# Patient Record
Sex: Male | Born: 1955
Health system: Southern US, Community
[De-identification: ages and names within clinical notes are randomized; demographics above are authoritative.]

## PROBLEM LIST (undated history)

## (undated) DIAGNOSIS — I428 Other cardiomyopathies: Secondary | ICD-10-CM

## (undated) DIAGNOSIS — Z860100 Personal history of colon polyps, unspecified: Secondary | ICD-10-CM

## (undated) DIAGNOSIS — I4719 Other supraventricular tachycardia: Secondary | ICD-10-CM

## (undated) DIAGNOSIS — F419 Anxiety disorder, unspecified: Secondary | ICD-10-CM

## (undated) DIAGNOSIS — R011 Cardiac murmur, unspecified: Secondary | ICD-10-CM

## (undated) DIAGNOSIS — I5022 Chronic systolic (congestive) heart failure: Secondary | ICD-10-CM

## (undated) DIAGNOSIS — K5790 Diverticulosis of intestine, part unspecified, without perforation or abscess without bleeding: Secondary | ICD-10-CM

## (undated) DIAGNOSIS — K219 Gastro-esophageal reflux disease without esophagitis: Secondary | ICD-10-CM

## (undated) DIAGNOSIS — E039 Hypothyroidism, unspecified: Secondary | ICD-10-CM

## (undated) DIAGNOSIS — E785 Hyperlipidemia, unspecified: Secondary | ICD-10-CM

## (undated) DIAGNOSIS — K760 Fatty (change of) liver, not elsewhere classified: Secondary | ICD-10-CM

## (undated) DIAGNOSIS — M545 Low back pain, unspecified: Secondary | ICD-10-CM

## (undated) DIAGNOSIS — M542 Cervicalgia: Secondary | ICD-10-CM

## (undated) DIAGNOSIS — E041 Nontoxic single thyroid nodule: Secondary | ICD-10-CM

## (undated) DIAGNOSIS — K279 Peptic ulcer, site unspecified, unspecified as acute or chronic, without hemorrhage or perforation: Secondary | ICD-10-CM

## (undated) DIAGNOSIS — I2510 Atherosclerotic heart disease of native coronary artery without angina pectoris: Secondary | ICD-10-CM

## (undated) DIAGNOSIS — I10 Essential (primary) hypertension: Secondary | ICD-10-CM

## (undated) DIAGNOSIS — M1990 Unspecified osteoarthritis, unspecified site: Secondary | ICD-10-CM

## (undated) DIAGNOSIS — I498 Other specified cardiac arrhythmias: Secondary | ICD-10-CM

## (undated) DIAGNOSIS — I471 Supraventricular tachycardia: Secondary | ICD-10-CM

## (undated) HISTORY — DX: Low back pain, unspecified: M54.50

## (undated) HISTORY — DX: Nontoxic single thyroid nodule: E04.1

## (undated) HISTORY — DX: Fatty (change of) liver, not elsewhere classified: K76.0

## (undated) HISTORY — DX: Cervicalgia: M54.2

## (undated) HISTORY — DX: Gastro-esophageal reflux disease without esophagitis: K21.9

## (undated) HISTORY — DX: Hereditary hemochromatosis: E83.110

## (undated) HISTORY — DX: Supraventricular tachycardia: I47.1

## (undated) HISTORY — DX: Personal history of colon polyps, unspecified: Z86.0100

## (undated) HISTORY — DX: Hyperlipidemia, unspecified: E78.5

## (undated) HISTORY — DX: Cardiac murmur, unspecified: R01.1

## (undated) HISTORY — DX: Diverticulosis of intestine, part unspecified, without perforation or abscess without bleeding: K57.90

## (undated) HISTORY — DX: Anxiety disorder, unspecified: F41.9

## (undated) HISTORY — PX: INGUINAL HERNIA REPAIR: SUR1180

## (undated) HISTORY — DX: Other supraventricular tachycardia: I47.19

## (undated) HISTORY — PX: TONSILLECTOMY: SUR1361

## (undated) HISTORY — DX: Personal history of colonic polyps: Z86.010

## (undated) HISTORY — DX: Unspecified osteoarthritis, unspecified site: M19.90

## (undated) HISTORY — DX: Low back pain: M54.5

## (undated) HISTORY — DX: Peptic ulcer, site unspecified, unspecified as acute or chronic, without hemorrhage or perforation: K27.9

## (undated) HISTORY — PX: SPINE SURGERY: SHX786

## (undated) HISTORY — DX: Essential (primary) hypertension: I10

---

## 1989-09-15 HISTORY — PX: BACK SURGERY: SHX140

## 1989-09-15 HISTORY — PX: LUMBAR DISC SURGERY: SHX700

## 1998-05-15 ENCOUNTER — Encounter: Admission: RE | Admit: 1998-05-15 | Discharge: 1998-08-13 | Payer: Self-pay | Admitting: Anesthesiology

## 1999-02-21 ENCOUNTER — Ambulatory Visit (HOSPITAL_COMMUNITY): Admission: RE | Admit: 1999-02-21 | Discharge: 1999-02-21 | Payer: Self-pay | Admitting: Internal Medicine

## 1999-02-21 ENCOUNTER — Encounter: Payer: Self-pay | Admitting: Internal Medicine

## 1999-03-25 ENCOUNTER — Ambulatory Visit (HOSPITAL_COMMUNITY): Admission: RE | Admit: 1999-03-25 | Discharge: 1999-03-25 | Payer: Self-pay | Admitting: Internal Medicine

## 1999-03-25 ENCOUNTER — Encounter: Payer: Self-pay | Admitting: Internal Medicine

## 1999-03-29 ENCOUNTER — Encounter: Payer: Self-pay | Admitting: Surgery

## 1999-03-29 ENCOUNTER — Inpatient Hospital Stay (HOSPITAL_COMMUNITY): Admission: EM | Admit: 1999-03-29 | Discharge: 1999-04-01 | Payer: Self-pay | Admitting: Emergency Medicine

## 1999-03-30 ENCOUNTER — Encounter: Payer: Self-pay | Admitting: Surgery

## 1999-04-01 ENCOUNTER — Encounter: Payer: Self-pay | Admitting: Surgery

## 1999-04-08 ENCOUNTER — Ambulatory Visit (HOSPITAL_COMMUNITY): Admission: RE | Admit: 1999-04-08 | Discharge: 1999-04-08 | Payer: Self-pay

## 2000-04-06 ENCOUNTER — Other Ambulatory Visit: Admission: RE | Admit: 2000-04-06 | Discharge: 2000-04-06 | Payer: Self-pay | Admitting: Gastroenterology

## 2001-02-16 ENCOUNTER — Encounter: Payer: Self-pay | Admitting: Orthopedic Surgery

## 2001-02-16 ENCOUNTER — Encounter: Admission: RE | Admit: 2001-02-16 | Discharge: 2001-02-16 | Payer: Self-pay | Admitting: Orthopedic Surgery

## 2001-02-23 ENCOUNTER — Encounter: Payer: Self-pay | Admitting: Orthopedic Surgery

## 2001-02-23 ENCOUNTER — Encounter: Admission: RE | Admit: 2001-02-23 | Discharge: 2001-02-23 | Payer: Self-pay | Admitting: Orthopedic Surgery

## 2001-03-10 ENCOUNTER — Encounter: Payer: Self-pay | Admitting: Orthopedic Surgery

## 2001-03-10 ENCOUNTER — Encounter: Admission: RE | Admit: 2001-03-10 | Discharge: 2001-03-10 | Payer: Self-pay | Admitting: Orthopedic Surgery

## 2001-03-25 ENCOUNTER — Encounter: Admission: RE | Admit: 2001-03-25 | Discharge: 2001-03-25 | Payer: Self-pay | Admitting: Orthopedic Surgery

## 2001-03-25 ENCOUNTER — Encounter: Payer: Self-pay | Admitting: Orthopedic Surgery

## 2001-04-22 ENCOUNTER — Ambulatory Visit (HOSPITAL_COMMUNITY): Admission: RE | Admit: 2001-04-22 | Discharge: 2001-04-22 | Payer: Self-pay | Admitting: Neurology

## 2001-05-24 ENCOUNTER — Encounter: Payer: Self-pay | Admitting: Endocrinology

## 2001-05-24 ENCOUNTER — Ambulatory Visit (HOSPITAL_COMMUNITY): Admission: RE | Admit: 2001-05-24 | Discharge: 2001-05-24 | Payer: Self-pay | Admitting: Endocrinology

## 2003-11-15 ENCOUNTER — Encounter: Payer: Self-pay | Admitting: Gastroenterology

## 2004-07-25 ENCOUNTER — Ambulatory Visit: Payer: Self-pay | Admitting: Internal Medicine

## 2004-07-25 ENCOUNTER — Ambulatory Visit: Payer: Self-pay | Admitting: Cardiology

## 2004-07-29 ENCOUNTER — Ambulatory Visit (HOSPITAL_COMMUNITY): Admission: RE | Admit: 2004-07-29 | Discharge: 2004-07-29 | Payer: Self-pay | Admitting: Internal Medicine

## 2004-10-30 ENCOUNTER — Ambulatory Visit: Payer: Self-pay | Admitting: Internal Medicine

## 2005-02-12 ENCOUNTER — Ambulatory Visit: Payer: Self-pay | Admitting: Internal Medicine

## 2005-08-21 ENCOUNTER — Encounter: Admission: RE | Admit: 2005-08-21 | Discharge: 2005-08-21 | Payer: Self-pay | Admitting: Orthopedic Surgery

## 2005-11-05 ENCOUNTER — Ambulatory Visit: Payer: Self-pay | Admitting: Internal Medicine

## 2005-11-12 ENCOUNTER — Ambulatory Visit: Payer: Self-pay | Admitting: Internal Medicine

## 2006-04-21 ENCOUNTER — Ambulatory Visit: Payer: Self-pay | Admitting: Internal Medicine

## 2006-10-22 ENCOUNTER — Ambulatory Visit: Payer: Self-pay | Admitting: Internal Medicine

## 2006-10-22 LAB — CONVERTED CEMR LAB
Alkaline Phosphatase: 75 units/L (ref 39–117)
BUN: 18 mg/dL (ref 6–23)
Basophils Absolute: 0 10*3/uL (ref 0.0–0.1)
Bilirubin, Direct: 0.1 mg/dL (ref 0.0–0.3)
CO2: 28 meq/L (ref 19–32)
Calcium: 9.1 mg/dL (ref 8.4–10.5)
Creatinine,U: 175.4 mg/dL
Eosinophils Relative: 2.4 % (ref 0.0–5.0)
GFR calc Af Amer: 92 mL/min
GFR calc non Af Amer: 76 mL/min
Glucose, Bld: 134 mg/dL — ABNORMAL HIGH (ref 70–99)
HCT: 44.2 % (ref 39.0–52.0)
Hemoglobin: 15.7 g/dL (ref 13.0–17.0)
Hgb A1c MFr Bld: 6.5 % — ABNORMAL HIGH (ref 4.6–6.0)
Leukocytes, UA: NEGATIVE
MCV: 90.7 fL (ref 78.0–100.0)
Microalb, Ur: 0.5 mg/dL (ref 0.0–1.9)
Monocytes Absolute: 0.9 10*3/uL — ABNORMAL HIGH (ref 0.2–0.7)
Monocytes Relative: 12.2 % — ABNORMAL HIGH (ref 3.0–11.0)
Neutrophils Relative %: 56.6 % (ref 43.0–77.0)
PSA: 0.38 ng/mL (ref 0.10–4.00)
RBC: 4.87 M/uL (ref 4.22–5.81)
RDW: 12.2 % (ref 11.5–14.6)
Specific Gravity, Urine: >=1.03 (ref 1.000–1.03)
Total Bilirubin: 0.5 mg/dL (ref 0.3–1.2)
Total CHOL/HDL Ratio: 5.8
Total Protein, Urine: NEGATIVE mg/dL
Total Protein: 7 g/dL (ref 6.0–8.3)
Triglycerides: 359 mg/dL (ref 0–149)
Urine Glucose: 250 mg/dL — AB

## 2006-10-23 ENCOUNTER — Encounter: Payer: Self-pay | Admitting: Internal Medicine

## 2007-06-07 ENCOUNTER — Encounter: Payer: Self-pay | Admitting: Internal Medicine

## 2007-06-07 DIAGNOSIS — I10 Essential (primary) hypertension: Secondary | ICD-10-CM | POA: Insufficient documentation

## 2007-06-07 DIAGNOSIS — Z8719 Personal history of other diseases of the digestive system: Secondary | ICD-10-CM | POA: Insufficient documentation

## 2007-06-07 DIAGNOSIS — Z860100 Personal history of colon polyps, unspecified: Secondary | ICD-10-CM | POA: Insufficient documentation

## 2007-06-07 DIAGNOSIS — E785 Hyperlipidemia, unspecified: Secondary | ICD-10-CM

## 2007-06-07 DIAGNOSIS — K828 Other specified diseases of gallbladder: Secondary | ICD-10-CM

## 2007-06-07 DIAGNOSIS — K219 Gastro-esophageal reflux disease without esophagitis: Secondary | ICD-10-CM

## 2007-11-02 ENCOUNTER — Ambulatory Visit: Payer: Self-pay | Admitting: Internal Medicine

## 2007-11-02 DIAGNOSIS — M545 Low back pain, unspecified: Secondary | ICD-10-CM | POA: Insufficient documentation

## 2007-11-02 DIAGNOSIS — K279 Peptic ulcer, site unspecified, unspecified as acute or chronic, without hemorrhage or perforation: Secondary | ICD-10-CM | POA: Insufficient documentation

## 2007-11-02 DIAGNOSIS — F411 Generalized anxiety disorder: Secondary | ICD-10-CM | POA: Insufficient documentation

## 2007-11-02 DIAGNOSIS — E119 Type 2 diabetes mellitus without complications: Secondary | ICD-10-CM | POA: Insufficient documentation

## 2007-11-03 LAB — CONVERTED CEMR LAB
AST: 52 units/L — ABNORMAL HIGH (ref 0–37)
BUN: 16 mg/dL (ref 6–23)
Basophils Absolute: 0 10*3/uL (ref 0.0–0.1)
Basophils Relative: 0.1 % (ref 0.0–1.0)
Calcium: 9.8 mg/dL (ref 8.4–10.5)
Chloride: 104 meq/L (ref 96–112)
Eosinophils Absolute: 0.3 10*3/uL (ref 0.0–0.6)
HCT: 45 % (ref 39.0–52.0)
Hemoglobin: 15.6 g/dL (ref 13.0–17.0)
Hgb A1c MFr Bld: 7 % — ABNORMAL HIGH (ref 4.6–6.0)
Ketones, ur: NEGATIVE mg/dL
Leukocytes, UA: NEGATIVE
MCHC: 34.6 g/dL (ref 30.0–36.0)
MCV: 90.5 fL (ref 78.0–100.0)
Microalb Creat Ratio: 12.8 mg/g (ref 0.0–30.0)
Monocytes Relative: 7.6 % (ref 3.0–11.0)
Nitrite: NEGATIVE
PSA: 0.39 ng/mL (ref 0.10–4.00)
Platelets: 192 10*3/uL (ref 150–400)
Potassium: 4.9 meq/L (ref 3.5–5.1)
RDW: 12.4 % (ref 11.5–14.6)
Sodium: 139 meq/L (ref 135–145)
Specific Gravity, Urine: >=1.03 (ref 1.000–1.03)
TSH: 2.43 microintl units/mL (ref 0.35–5.50)
Total Bilirubin: 0.9 mg/dL (ref 0.3–1.2)
Total CHOL/HDL Ratio: 7.5
Total Protein, Urine: NEGATIVE mg/dL
Total Protein: 7.4 g/dL (ref 6.0–8.3)
Triglycerides: 324 mg/dL (ref 0–149)
Urine Glucose: 100 mg/dL — AB
pH: 5.5 (ref 5.0–8.0)

## 2008-01-12 ENCOUNTER — Ambulatory Visit: Payer: Self-pay | Admitting: Internal Medicine

## 2008-01-12 DIAGNOSIS — H6590 Unspecified nonsuppurative otitis media, unspecified ear: Secondary | ICD-10-CM | POA: Insufficient documentation

## 2008-05-01 ENCOUNTER — Ambulatory Visit: Payer: Self-pay | Admitting: Internal Medicine

## 2008-05-01 LAB — CONVERTED CEMR LAB
BUN: 10 mg/dL (ref 6–23)
Creatinine, Ser: 1.2 mg/dL (ref 0.4–1.5)
Direct LDL: 74.4 mg/dL
GFR calc Af Amer: 82 mL/min
HDL: 20.7 mg/dL — ABNORMAL LOW (ref >39.0)
Hgb A1c MFr Bld: 7 % — ABNORMAL HIGH (ref 4.6–6.0)
Potassium: 3.7 meq/L (ref 3.5–5.1)

## 2008-05-02 ENCOUNTER — Ambulatory Visit: Payer: Self-pay | Admitting: Internal Medicine

## 2008-05-02 DIAGNOSIS — H6980 Other specified disorders of Eustachian tube, unspecified ear: Secondary | ICD-10-CM

## 2008-10-27 ENCOUNTER — Ambulatory Visit: Payer: Self-pay | Admitting: Internal Medicine

## 2008-10-27 LAB — CONVERTED CEMR LAB
ALT: 129 units/L — ABNORMAL HIGH (ref 0–53)
AST: 91 units/L — ABNORMAL HIGH (ref 0–37)
Albumin: 4.1 g/dL (ref 3.5–5.2)
Basophils Absolute: 0.1 10*3/uL (ref 0.0–0.1)
Basophils Relative: 0.7 % (ref 0.0–3.0)
CO2: 30 meq/L (ref 19–32)
Calcium: 9.4 mg/dL (ref 8.4–10.5)
Cholesterol: 164 mg/dL (ref 0–200)
Creatinine, Ser: 1 mg/dL (ref 0.4–1.5)
Creatinine,U: 167.3 mg/dL
Direct LDL: 90.4 mg/dL
Eosinophils Relative: 2 % (ref 0.0–5.0)
Hgb A1c MFr Bld: 7.1 % — ABNORMAL HIGH (ref 4.6–6.0)
Lymphocytes Relative: 29.1 % (ref 12.0–46.0)
MCHC: 35.5 g/dL (ref 30.0–36.0)
Microalb, Ur: 0.6 mg/dL (ref 0.0–1.9)
Monocytes Absolute: 0.9 10*3/uL (ref 0.1–1.0)
Monocytes Relative: 11 % (ref 3.0–12.0)
Neutro Abs: 4.4 10*3/uL (ref 1.4–7.7)
Neutrophils Relative %: 57.2 % (ref 43.0–77.0)
Platelets: 176 10*3/uL (ref 150–400)
RDW: 12.5 % (ref 11.5–14.6)
TSH: 2.88 microintl units/mL (ref 0.35–5.50)
Total Protein, Urine: NEGATIVE mg/dL
Total Protein: 7.4 g/dL (ref 6.0–8.3)
Triglycerides: 241 mg/dL (ref 0–149)
Urine Glucose: 100 mg/dL — AB

## 2008-11-02 ENCOUNTER — Ambulatory Visit: Payer: Self-pay | Admitting: Internal Medicine

## 2008-11-02 DIAGNOSIS — R740 Nonspecific elevation of levels of transaminase and lactic acid dehydrogenase [LDH]: Secondary | ICD-10-CM

## 2008-11-02 DIAGNOSIS — R079 Chest pain, unspecified: Secondary | ICD-10-CM

## 2008-11-10 ENCOUNTER — Ambulatory Visit: Payer: Self-pay

## 2008-11-10 ENCOUNTER — Encounter: Payer: Self-pay | Admitting: Internal Medicine

## 2008-11-13 ENCOUNTER — Telehealth: Payer: Self-pay | Admitting: Internal Medicine

## 2008-11-13 DIAGNOSIS — R9431 Abnormal electrocardiogram [ECG] [EKG]: Secondary | ICD-10-CM

## 2008-11-17 ENCOUNTER — Ambulatory Visit: Payer: Self-pay | Admitting: Cardiology

## 2008-11-17 LAB — CONVERTED CEMR LAB
Basophils Absolute: 0 10*3/uL (ref 0.0–0.1)
Basophils Relative: 0 % (ref 0.0–3.0)
CO2: 29 meq/L (ref 19–32)
Eosinophils Relative: 1.9 % (ref 0.0–5.0)
GFR calc non Af Amer: 75 mL/min
Glucose, Bld: 132 mg/dL — ABNORMAL HIGH (ref 70–99)
Hemoglobin: 16.5 g/dL (ref 13.0–17.0)
Lymphocytes Relative: 28.9 % (ref 12.0–46.0)
MCHC: 34.7 g/dL (ref 30.0–36.0)
Neutro Abs: 5.4 10*3/uL (ref 1.4–7.7)
RBC: 5.2 M/uL (ref 4.22–5.81)
RDW: 12.6 % (ref 11.5–14.6)
Sodium: 140 meq/L (ref 135–145)

## 2008-11-22 ENCOUNTER — Inpatient Hospital Stay (HOSPITAL_BASED_OUTPATIENT_CLINIC_OR_DEPARTMENT_OTHER): Admission: RE | Admit: 2008-11-22 | Discharge: 2008-11-22 | Payer: Self-pay | Admitting: Cardiology

## 2008-11-22 ENCOUNTER — Ambulatory Visit: Payer: Self-pay | Admitting: Cardiology

## 2008-11-30 ENCOUNTER — Ambulatory Visit: Payer: Self-pay | Admitting: Internal Medicine

## 2008-11-30 LAB — CONVERTED CEMR LAB
ALT: 115 units/L — ABNORMAL HIGH (ref 0–53)
Albumin: 4.2 g/dL (ref 3.5–5.2)
Alkaline Phosphatase: 75 units/L (ref 39–117)
Bilirubin, Direct: 0.2 mg/dL (ref 0.0–0.3)
Total Bilirubin: 1.3 mg/dL — ABNORMAL HIGH (ref 0.3–1.2)
Total CHOL/HDL Ratio: 6
Total Protein: 7.7 g/dL (ref 6.0–8.3)
Triglycerides: 192 mg/dL — ABNORMAL HIGH (ref 0.0–149.0)

## 2008-12-01 ENCOUNTER — Encounter: Payer: Self-pay | Admitting: Internal Medicine

## 2008-12-01 ENCOUNTER — Encounter (INDEPENDENT_AMBULATORY_CARE_PROVIDER_SITE_OTHER): Payer: Self-pay | Admitting: *Deleted

## 2008-12-04 LAB — CONVERTED CEMR LAB
HCV Ab: NEGATIVE
Hep B C IgM: NEGATIVE

## 2008-12-07 ENCOUNTER — Ambulatory Visit: Payer: Self-pay | Admitting: Cardiology

## 2008-12-07 ENCOUNTER — Encounter: Payer: Self-pay | Admitting: Cardiology

## 2008-12-29 ENCOUNTER — Ambulatory Visit: Payer: Self-pay | Admitting: Gastroenterology

## 2008-12-29 LAB — CONVERTED CEMR LAB
ANA Titer 1: 1:40 {titer} — ABNORMAL HIGH
Ceruloplasmin: 35 mg/dL (ref 21–63)
Ferritin: 513.3 ng/mL — ABNORMAL HIGH (ref 22.0–322.0)
Iron: 127 ug/dL (ref 42–165)
Saturation Ratios: 39.8 % (ref 20.0–50.0)
Sed Rate: 9 mm/hr (ref 0–22)
Transferrin: 228.2 mg/dL (ref 212.0–360.0)
Vitamin B-12: 610 pg/mL (ref 211–911)

## 2009-01-02 ENCOUNTER — Ambulatory Visit (HOSPITAL_COMMUNITY): Admission: RE | Admit: 2009-01-02 | Discharge: 2009-01-02 | Payer: Self-pay | Admitting: Gastroenterology

## 2009-01-09 ENCOUNTER — Telehealth: Payer: Self-pay | Admitting: Gastroenterology

## 2009-01-16 ENCOUNTER — Ambulatory Visit: Payer: Self-pay | Admitting: Gastroenterology

## 2009-01-17 LAB — CONVERTED CEMR LAB
Albumin: 4 g/dL (ref 3.5–5.2)
Alkaline Phosphatase: 72 units/L (ref 39–117)
Bilirubin, Direct: 0.1 mg/dL (ref 0.0–0.3)
Total Bilirubin: 1.1 mg/dL (ref 0.3–1.2)

## 2009-01-23 ENCOUNTER — Ambulatory Visit: Payer: Self-pay | Admitting: Gastroenterology

## 2009-01-23 DIAGNOSIS — M1289 Other specific arthropathies, not elsewhere classified, multiple sites: Secondary | ICD-10-CM | POA: Insufficient documentation

## 2009-01-29 ENCOUNTER — Ambulatory Visit: Payer: Self-pay | Admitting: Gastroenterology

## 2009-03-01 ENCOUNTER — Ambulatory Visit: Payer: Self-pay | Admitting: Gastroenterology

## 2009-03-02 LAB — CONVERTED CEMR LAB
HCT: 44.5 % (ref 39.0–52.0)
Hemoglobin: 15.5 g/dL (ref 13.0–17.0)

## 2009-03-30 ENCOUNTER — Ambulatory Visit: Payer: Self-pay | Admitting: Gastroenterology

## 2009-03-30 LAB — CONVERTED CEMR LAB
Ferritin: 306.8 ng/mL (ref 22.0–322.0)
MCV: 91.2 fL (ref 78.0–100.0)
Monocytes Relative: 10.2 % (ref 3.0–12.0)
Platelets: 176 10*3/uL (ref 150.0–400.0)
RBC: 4.96 M/uL (ref 4.22–5.81)
RDW: 12.8 % (ref 11.5–14.6)

## 2009-04-23 ENCOUNTER — Ambulatory Visit: Payer: Self-pay | Admitting: Internal Medicine

## 2009-04-23 LAB — CONVERTED CEMR LAB
BUN: 16 mg/dL (ref 6–23)
CO2: 29 meq/L (ref 19–32)
Calcium: 9.4 mg/dL (ref 8.4–10.5)
Chloride: 106 meq/L (ref 96–112)
Cholesterol: 193 mg/dL (ref 0–200)
Creatinine, Ser: 1.1 mg/dL (ref 0.4–1.5)
Direct LDL: 119.4 mg/dL
GFR calc non Af Amer: 74.69 mL/min (ref >60)
Glucose, Bld: 124 mg/dL — ABNORMAL HIGH (ref 70–99)
HDL: 24 mg/dL — ABNORMAL LOW (ref >39.00)
Hgb A1c MFr Bld: 6.3 % (ref 4.6–6.5)
Potassium: 4.5 meq/L (ref 3.5–5.1)
Sodium: 143 meq/L (ref 135–145)
Total CHOL/HDL Ratio: 8
Triglycerides: 246 mg/dL — ABNORMAL HIGH (ref 0.0–149.0)
VLDL: 49.2 mg/dL — ABNORMAL HIGH (ref 0.0–40.0)

## 2009-05-01 ENCOUNTER — Ambulatory Visit: Payer: Self-pay | Admitting: Gastroenterology

## 2009-05-02 LAB — CONVERTED CEMR LAB: HCT: 45.9 % (ref 39.0–52.0)

## 2009-05-03 ENCOUNTER — Ambulatory Visit: Payer: Self-pay | Admitting: Internal Medicine

## 2009-05-03 DIAGNOSIS — M542 Cervicalgia: Secondary | ICD-10-CM | POA: Insufficient documentation

## 2009-05-31 ENCOUNTER — Ambulatory Visit: Payer: Self-pay | Admitting: Gastroenterology

## 2009-06-04 LAB — CONVERTED CEMR LAB
Basophils Absolute: 0 10*3/uL (ref 0.0–0.1)
Ferritin: 193.4 ng/mL (ref 22.0–322.0)
HCT: 45.9 % (ref 39.0–52.0)
Hemoglobin: 15.5 g/dL (ref 13.0–17.0)
Lymphs Abs: 2.6 10*3/uL (ref 0.7–4.0)
MCHC: 33.8 g/dL (ref 30.0–36.0)
MCV: 93 fL (ref 78.0–100.0)
Monocytes Absolute: 0.6 10*3/uL (ref 0.1–1.0)
Neutrophils Relative %: 60.6 % (ref 43.0–77.0)
Platelets: 193 10*3/uL (ref 150.0–400.0)
WBC: 8.9 10*3/uL (ref 4.5–10.5)

## 2009-06-12 ENCOUNTER — Telehealth: Payer: Self-pay | Admitting: Internal Medicine

## 2009-06-29 ENCOUNTER — Encounter: Payer: Self-pay | Admitting: Internal Medicine

## 2009-07-02 ENCOUNTER — Ambulatory Visit: Payer: Self-pay | Admitting: Gastroenterology

## 2009-07-04 LAB — CONVERTED CEMR LAB: HCT: 45.2 % (ref 39.0–52.0)

## 2009-07-31 ENCOUNTER — Ambulatory Visit: Payer: Self-pay | Admitting: Gastroenterology

## 2009-07-31 LAB — CONVERTED CEMR LAB: HCT: 46.6 % (ref 39.0–52.0)

## 2009-08-31 ENCOUNTER — Ambulatory Visit: Payer: Self-pay | Admitting: Gastroenterology

## 2009-10-02 ENCOUNTER — Ambulatory Visit: Payer: Self-pay | Admitting: Gastroenterology

## 2009-10-02 LAB — CONVERTED CEMR LAB: Hemoglobin: 14.4 g/dL (ref 13.0–17.0)

## 2009-11-01 ENCOUNTER — Ambulatory Visit: Payer: Self-pay | Admitting: Internal Medicine

## 2009-11-01 LAB — CONVERTED CEMR LAB
ALT: 114 units/L — ABNORMAL HIGH (ref 0–53)
Albumin: 3.8 g/dL (ref 3.5–5.2)
BUN: 10 mg/dL (ref 6–23)
Basophils Absolute: 0.1 10*3/uL (ref 0.0–0.1)
Bilirubin, Direct: 0.1 mg/dL (ref 0.0–0.3)
Creatinine, Ser: 1 mg/dL (ref 0.4–1.5)
Creatinine,U: 146.1 mg/dL
Direct LDL: 141 mg/dL
Eosinophils Relative: 2.6 % (ref 0.0–5.0)
HCT: 42 % (ref 39.0–52.0)
Hemoglobin: 13.6 g/dL (ref 13.0–17.0)
Lymphocytes Relative: 29.6 % (ref 12.0–46.0)
Microalb Creat Ratio: 4.1 mg/g (ref 0.0–30.0)
Monocytes Absolute: 0.6 10*3/uL (ref 0.1–1.0)
Monocytes Relative: 8.2 % (ref 3.0–12.0)
PSA: 0.41 ng/mL (ref 0.10–4.00)
Potassium: 4.4 meq/L (ref 3.5–5.1)
RBC: 4.89 M/uL (ref 4.22–5.81)
RDW: 12.6 % (ref 11.5–14.6)
TSH: 2.39 microintl units/mL (ref 0.35–5.50)
Total Bilirubin: 0.5 mg/dL (ref 0.3–1.2)
Total CHOL/HDL Ratio: 7
Total Protein, Urine: NEGATIVE mg/dL
Total Protein: 7.9 g/dL (ref 6.0–8.3)
Urine Glucose: 100 mg/dL
VLDL: 51.8 mg/dL — ABNORMAL HIGH (ref 0.0–40.0)
WBC: 7.5 10*3/uL (ref 4.5–10.5)

## 2009-11-08 ENCOUNTER — Ambulatory Visit: Payer: Self-pay | Admitting: Internal Medicine

## 2009-11-08 ENCOUNTER — Telehealth: Payer: Self-pay | Admitting: Internal Medicine

## 2009-12-31 ENCOUNTER — Ambulatory Visit: Payer: Self-pay | Admitting: Gastroenterology

## 2009-12-31 ENCOUNTER — Telehealth: Payer: Self-pay | Admitting: Gastroenterology

## 2009-12-31 LAB — CONVERTED CEMR LAB: Ferritin: 23.4 ng/mL (ref 22.0–322.0)

## 2010-01-02 ENCOUNTER — Ambulatory Visit: Payer: Self-pay | Admitting: Cardiology

## 2010-01-21 ENCOUNTER — Ambulatory Visit (HOSPITAL_COMMUNITY): Admission: RE | Admit: 2010-01-21 | Discharge: 2010-01-21 | Payer: Self-pay | Admitting: Cardiology

## 2010-01-21 ENCOUNTER — Encounter: Payer: Self-pay | Admitting: Cardiology

## 2010-01-21 ENCOUNTER — Ambulatory Visit: Payer: Self-pay

## 2010-01-21 ENCOUNTER — Ambulatory Visit: Payer: Self-pay | Admitting: Internal Medicine

## 2010-01-22 ENCOUNTER — Ambulatory Visit: Payer: Self-pay | Admitting: Cardiology

## 2010-01-22 ENCOUNTER — Encounter (INDEPENDENT_AMBULATORY_CARE_PROVIDER_SITE_OTHER): Payer: Self-pay | Admitting: *Deleted

## 2010-01-22 DIAGNOSIS — R000 Tachycardia, unspecified: Secondary | ICD-10-CM

## 2010-01-22 DIAGNOSIS — I5022 Chronic systolic (congestive) heart failure: Secondary | ICD-10-CM

## 2010-01-25 ENCOUNTER — Ambulatory Visit: Payer: Self-pay | Admitting: Cardiology

## 2010-01-29 ENCOUNTER — Telehealth: Payer: Self-pay | Admitting: Cardiology

## 2010-02-10 IMAGING — US US ABDOMEN COMPLETE
1 series · 14 of 25 positions shown · non-contrast
Comparison: 07/29/2004.

CLINICAL DATA: Abdominal pain.

COMPLETE ABDOMINAL ULTRASOUND

[Series 1: us abdomen complete · 0.28mm/px · 14 of 103 slices shown]
[im 1/103]
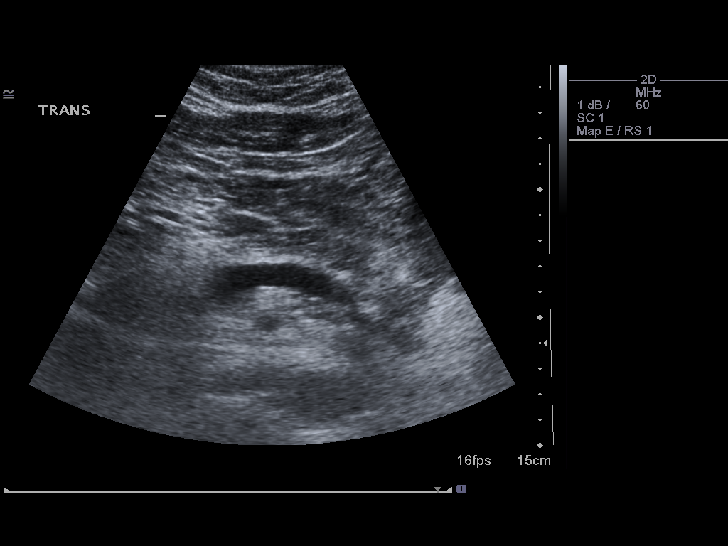
[im 9/103]
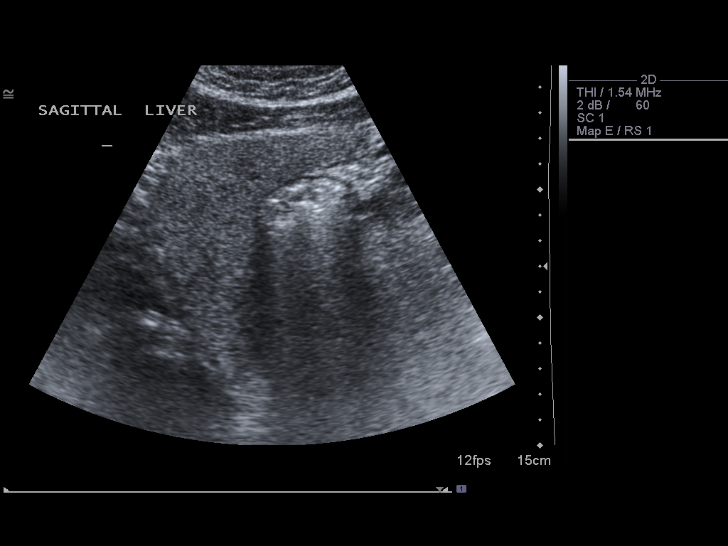
[im 18/103]
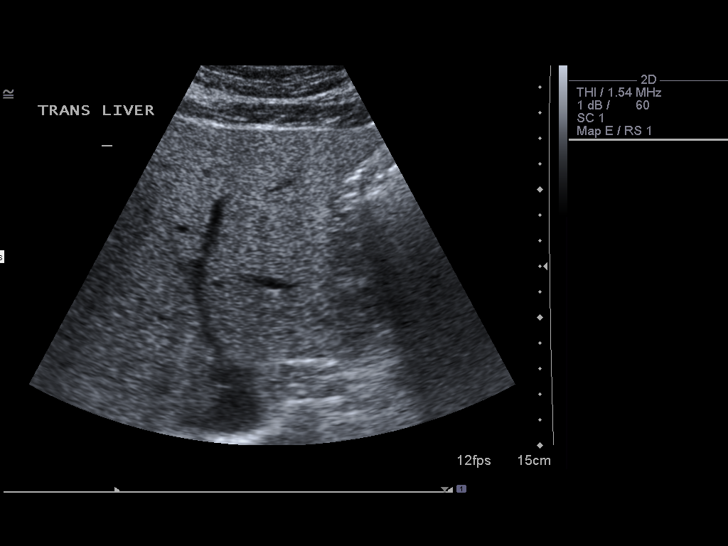
[im 26/103]
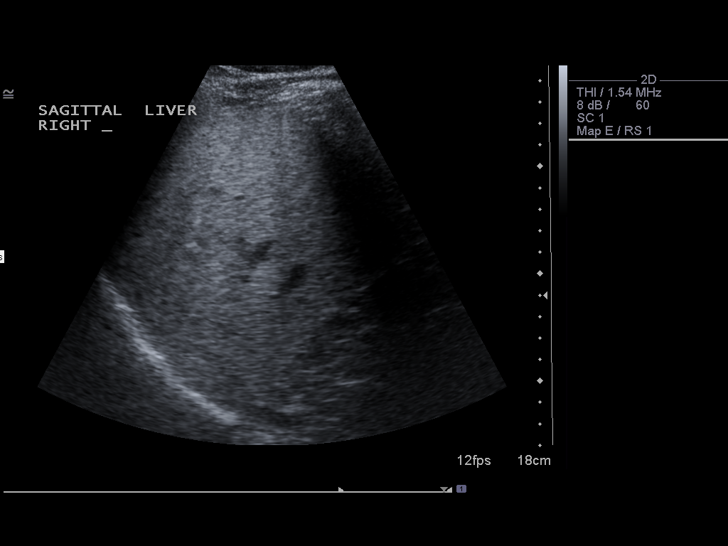
[im 35/103]
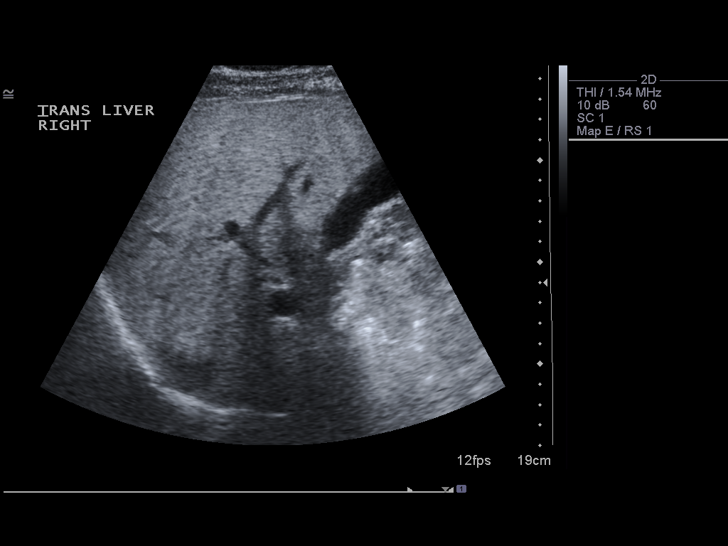
[im 39/103]
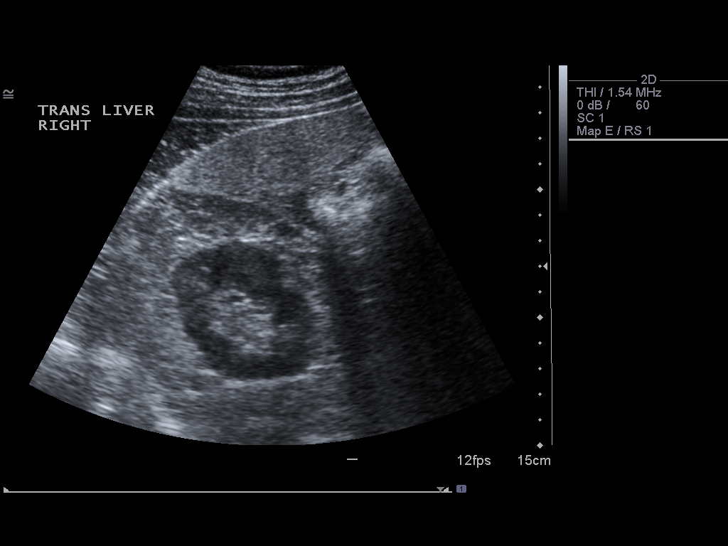
[im 47/103]
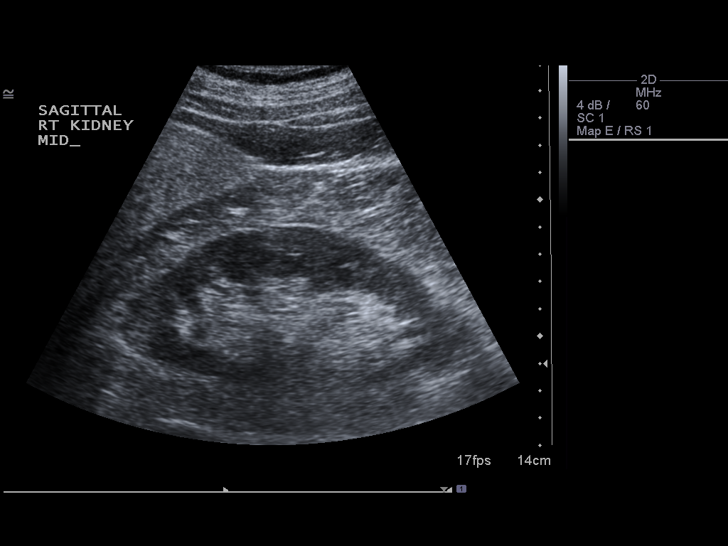
[im 56/103]
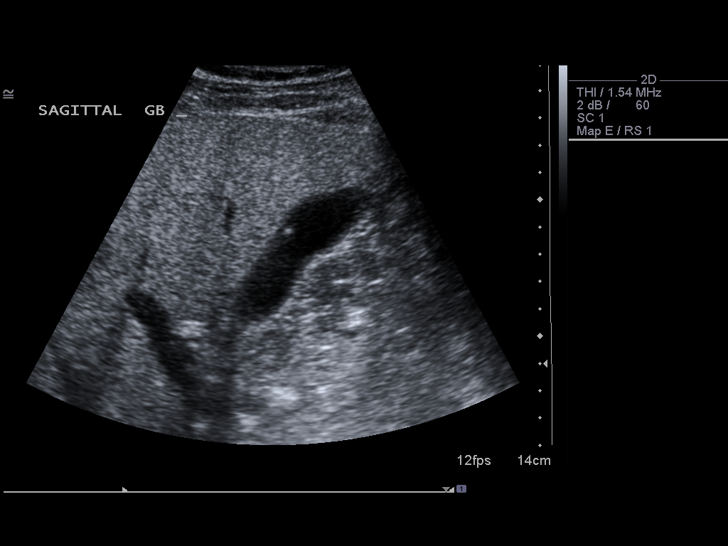
[im 64/103]
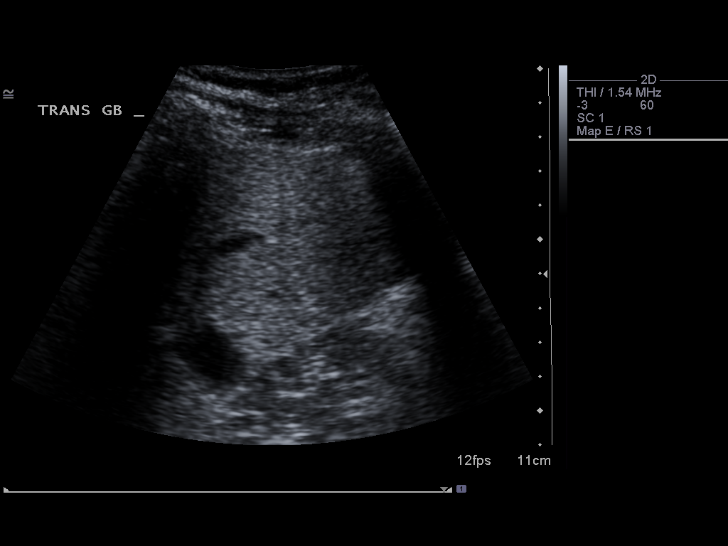
[im 69/103]
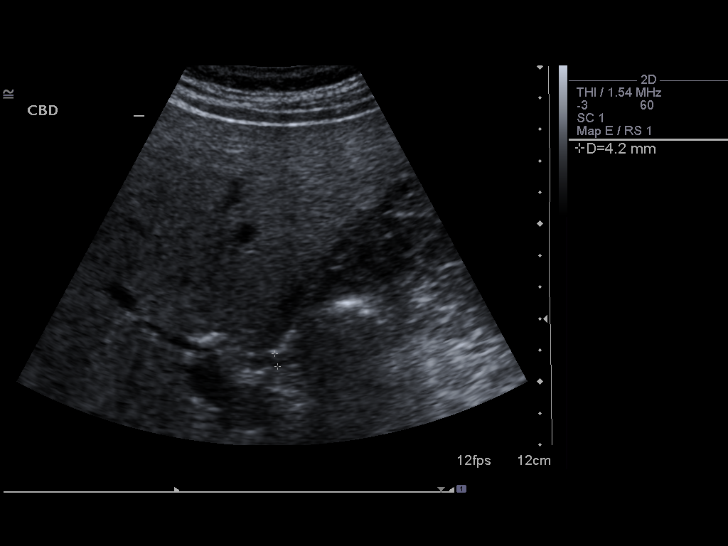
[im 77/103]
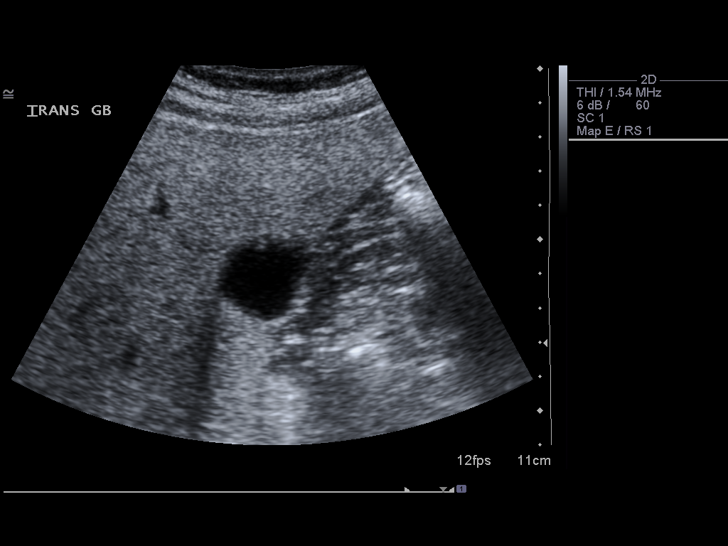
[im 86/103]
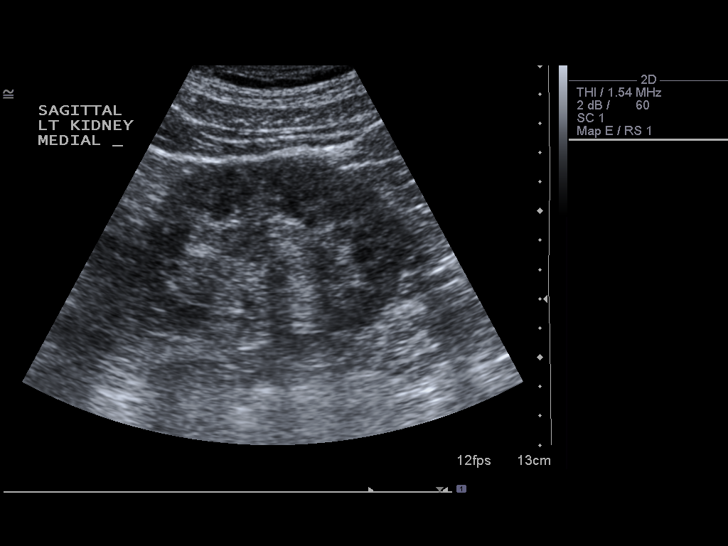
[im 94/103]
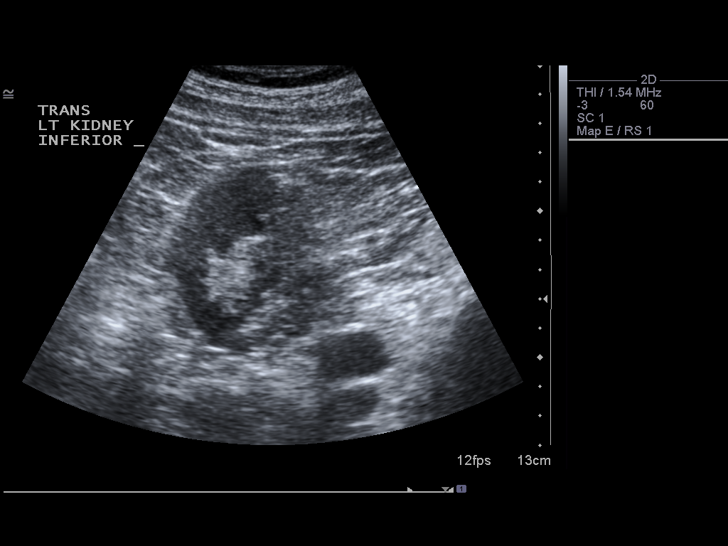
[im 103/103]
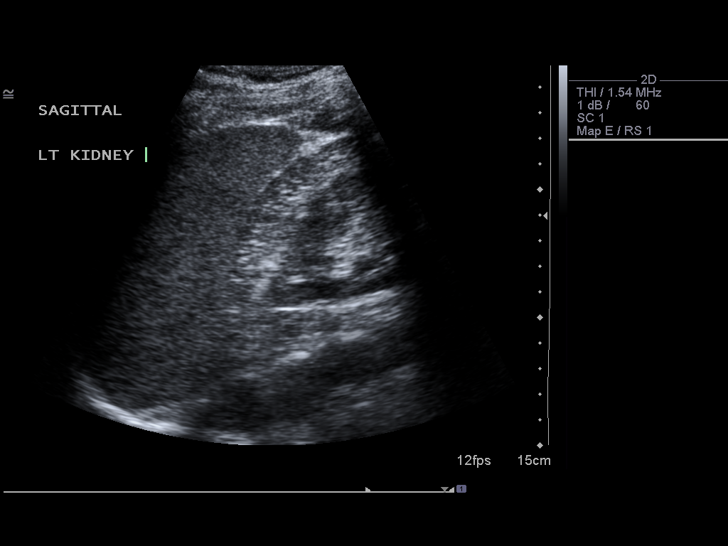

[14 of 25 positions shown; findings below may reference images not displayed]

FINDINGS: Gallbladder:  There is a non mobile nonshadowing echogenic focus
adjacent to the   gallbladder wall compatible with a polyp
measuring 5 mm.  This is similar to the prior study.  No gallstones
are present and there is no gallbladder wall thickening.

Common bile duct:  4.2 mm.

Liver:  Echogenic liver compatible with fatty infiltration.

IVC:  Negative.

Pancreas:  Limited evaluation of  the pancreas due to bowel gas.
No focal mass or fluid is identified.

Spleen:  Upper normal sized spleen measuring 12 cm.  This is
similar to the prior study.

Right Kidney:  13.2 cm.  No focal abnormality.

Left Kidney:  12.5 cm.  No focal abnormality.

Abdominal aorta:  2.2 cm in diameter without aneurysm.
IMPRESSION: Gallbladder polyp without gallstones

Fatty infiltration of the liver.

Spleen is upper normal in size.

## 2010-02-13 ENCOUNTER — Ambulatory Visit (HOSPITAL_COMMUNITY): Admission: RE | Admit: 2010-02-13 | Discharge: 2010-02-13 | Payer: Self-pay | Admitting: Cardiology

## 2010-02-14 ENCOUNTER — Ambulatory Visit: Payer: Self-pay | Admitting: Cardiology

## 2010-02-15 LAB — CONVERTED CEMR LAB
Bilirubin, Direct: 0.2 mg/dL (ref 0.0–0.3)
TSH: 3.61 microintl units/mL (ref 0.35–5.50)
Total Bilirubin: 0.6 mg/dL (ref 0.3–1.2)

## 2010-02-21 ENCOUNTER — Ambulatory Visit: Payer: Self-pay | Admitting: Internal Medicine

## 2010-02-22 ENCOUNTER — Encounter: Payer: Self-pay | Admitting: Internal Medicine

## 2010-02-26 ENCOUNTER — Encounter: Payer: Self-pay | Admitting: Cardiology

## 2010-02-27 ENCOUNTER — Telehealth: Payer: Self-pay | Admitting: Cardiology

## 2010-03-01 ENCOUNTER — Ambulatory Visit: Payer: Self-pay | Admitting: Cardiology

## 2010-04-03 ENCOUNTER — Ambulatory Visit: Payer: Self-pay | Admitting: Cardiology

## 2010-04-16 ENCOUNTER — Telehealth: Payer: Self-pay | Admitting: Cardiology

## 2010-04-17 ENCOUNTER — Telehealth: Payer: Self-pay | Admitting: Cardiology

## 2010-04-17 ENCOUNTER — Ambulatory Visit: Payer: Self-pay | Admitting: Internal Medicine

## 2010-04-17 ENCOUNTER — Encounter: Payer: Self-pay | Admitting: Cardiology

## 2010-04-18 ENCOUNTER — Telehealth: Payer: Self-pay | Admitting: Cardiology

## 2010-04-18 LAB — CONVERTED CEMR LAB
Direct LDL: 134 mg/dL
GFR calc non Af Amer: 66.03 mL/min (ref >60)
Glucose, Bld: 116 mg/dL — ABNORMAL HIGH (ref 70–99)
HDL: 27.8 mg/dL — ABNORMAL LOW (ref >39.00)
Hgb A1c MFr Bld: 6.9 % — ABNORMAL HIGH (ref 4.6–6.5)
Total CHOL/HDL Ratio: 7
Triglycerides: 233 mg/dL — ABNORMAL HIGH (ref 0.0–149.0)

## 2010-05-09 ENCOUNTER — Ambulatory Visit: Payer: Self-pay | Admitting: Internal Medicine

## 2010-06-04 ENCOUNTER — Ambulatory Visit: Payer: Self-pay | Admitting: Cardiology

## 2010-06-19 ENCOUNTER — Telehealth: Payer: Self-pay | Admitting: Cardiology

## 2010-06-20 ENCOUNTER — Telehealth: Payer: Self-pay | Admitting: Cardiology

## 2010-06-28 ENCOUNTER — Ambulatory Visit: Payer: Self-pay | Admitting: Gastroenterology

## 2010-09-04 ENCOUNTER — Ambulatory Visit: Payer: Self-pay | Admitting: Cardiology

## 2010-09-04 ENCOUNTER — Encounter: Payer: Self-pay | Admitting: Cardiology

## 2010-09-12 ENCOUNTER — Ambulatory Visit: Payer: Self-pay

## 2010-09-20 ENCOUNTER — Ambulatory Visit
Admission: RE | Admit: 2010-09-20 | Discharge: 2010-09-20 | Payer: Self-pay | Source: Home / Self Care | Attending: Cardiology | Admitting: Cardiology

## 2010-09-20 ENCOUNTER — Ambulatory Visit
Admission: RE | Admit: 2010-09-20 | Discharge: 2010-09-20 | Payer: Self-pay | Source: Home / Self Care | Attending: Gastroenterology | Admitting: Gastroenterology

## 2010-09-20 ENCOUNTER — Other Ambulatory Visit: Payer: Self-pay | Admitting: Gastroenterology

## 2010-09-20 ENCOUNTER — Other Ambulatory Visit: Payer: Self-pay | Admitting: Cardiology

## 2010-09-20 LAB — BASIC METABOLIC PANEL
BUN: 17 mg/dL (ref 6–23)
CO2: 27 mEq/L (ref 19–32)
Calcium: 9.3 mg/dL (ref 8.4–10.5)
Chloride: 102 mEq/L (ref 96–112)
Creatinine, Ser: 1.3 mg/dL (ref 0.4–1.5)
GFR: 62.94 mL/min (ref >60.00)
Glucose, Bld: 199 mg/dL — ABNORMAL HIGH (ref 70–99)
Potassium: 4.5 mEq/L (ref 3.5–5.1)
Sodium: 138 mEq/L (ref 135–145)

## 2010-09-20 LAB — FERRITIN: Ferritin: 22.9 ng/mL (ref 22.0–322.0)

## 2010-09-30 ENCOUNTER — Ambulatory Visit (HOSPITAL_COMMUNITY): Admission: RE | Admit: 2010-09-30 | Payer: Self-pay | Source: Home / Self Care | Admitting: Cardiology

## 2010-09-30 ENCOUNTER — Ambulatory Visit: Admit: 2010-09-30 | Payer: Self-pay

## 2010-10-07 ENCOUNTER — Encounter: Payer: Self-pay | Admitting: Gastroenterology

## 2010-10-11 ENCOUNTER — Encounter (INDEPENDENT_AMBULATORY_CARE_PROVIDER_SITE_OTHER): Payer: Self-pay | Admitting: *Deleted

## 2010-10-11 ENCOUNTER — Ambulatory Visit
Admission: RE | Admit: 2010-10-11 | Discharge: 2010-10-11 | Payer: Self-pay | Source: Home / Self Care | Attending: Gastroenterology | Admitting: Gastroenterology

## 2010-10-11 ENCOUNTER — Other Ambulatory Visit: Payer: Self-pay | Admitting: Gastroenterology

## 2010-10-11 ENCOUNTER — Encounter: Payer: Self-pay | Admitting: Gastroenterology

## 2010-10-11 DIAGNOSIS — R142 Eructation: Secondary | ICD-10-CM

## 2010-10-11 DIAGNOSIS — R141 Gas pain: Secondary | ICD-10-CM | POA: Insufficient documentation

## 2010-10-11 DIAGNOSIS — R143 Flatulence: Secondary | ICD-10-CM

## 2010-10-11 LAB — FOLATE: Folate: 11.4 ng/mL (ref >5.9)

## 2010-10-13 LAB — CONVERTED CEMR LAB
ALT: 42 units/L (ref 0–53)
AST: 30 units/L (ref 0–37)
Alkaline Phosphatase: 56 units/L (ref 39–117)
BUN: 16 mg/dL (ref 6–23)
BUN: 21 mg/dL (ref 6–23)
Bilirubin, Direct: 0.1 mg/dL (ref 0.0–0.3)
CO2: 29 meq/L (ref 19–32)
Calcium: 9.9 mg/dL (ref 8.4–10.5)
Chloride: 102 meq/L (ref 96–112)
Chloride: 105 meq/L (ref 96–112)
Cholesterol: 193 mg/dL (ref 0–200)
Creatinine, Ser: 1.2 mg/dL (ref 0.4–1.5)
Glucose, Bld: 122 mg/dL — ABNORMAL HIGH (ref 70–99)
HDL: 29.8 mg/dL — ABNORMAL LOW (ref >39.00)
Potassium: 4.5 meq/L (ref 3.5–5.1)
Potassium: 4.9 meq/L (ref 3.5–5.1)
Pro B Natriuretic peptide (BNP): 21.8 pg/mL (ref 0.0–100.0)
Sodium: 138 meq/L (ref 135–145)
Sodium: 140 meq/L (ref 135–145)
TSH: 4.95 microintl units/mL (ref 0.35–5.50)
Total Bilirubin: 0.4 mg/dL (ref 0.3–1.2)

## 2010-10-15 NOTE — Assessment & Plan Note (Signed)
Summary: add-on per DOD/lwb  Medications Added TOPROL XL 50 MG XR24H-TAB (METOPROLOL SUCCINATE) one twice a day PROTONIX 40 MG TBEC (PANTOPRAZOLE SODIUM) one tablet twice a day      Allergies Added:   Primary Provider:  Oliver Barre, MD  CC:  add on/pt had tachycardia yesterday.  Marland Kitchen  History of Present Illness: 55 yo with h/o DM2, hyperlipidemia, left heart cath in 3/10 showing mild luminal irregularities, and hereditary hemochromatosis getting phlebotomy treatment presents for an unscheduled visit due to atrial arrhythmias noted when he was getting his echocardiogram.  Initially, he was thought to be in atrial fibrillation but I have reviewed his ECGs from that day and today and it appears that he has wandering atrial pacemaker/multifocal atrial tachycardia.  Additionally, EF was noted to be depressed on echo (estimated at 30%).  Patient has been noticing his heart flutter frequently since I saw him last.  No lightheadedness of syncope.  He fatigues easily and feels weak in general.  He has been having episodes of chest pressure that seem to be associated with meals, similar to prior to his cath in 3/10.  He can climb a flight of steps without dyspnea.  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  Back pain limits his exercise, he is able to walk some though.  He is not taking a statin because of elevated LFTs.    Labs (3/10):  hepatitis panel negative, LDL 79, HDL 25, TG 192, AST 79, ALT 115 Labs (4/11): ferritin 23  ECG: Wandering atrial pacemaker/multifocal atrial tachycardia from ECGs yesterday and today.   Current Medications (verified): 1)  Omeprazole 20 Mg Cpdr (Omeprazole) .... 2 Po Once Daily 2)  Metoprolol Tartrate 25 Mg Tabs (Metoprolol Tartrate) .Marland Kitchen.. 1po Two Times A Day 3)  Ecotrin Low Strength 81 Mg  Tbec (Aspirin) .Marland Kitchen.. 1po Qd 4)  Tramadol Hcl 50 Mg Tabs (Tramadol Hcl) .Marland Kitchen.. 1 By Mouth Qid As Needed Pain 5)  Metformin Hcl 500 Mg Xr24h-Tab (Metformin  Hcl) .... 3 By Mouth Once Daily 6)  Lisinopril 5 Mg Tabs (Lisinopril) .... One By Mouth Daliy 7)  Diazepam 5 Mg Tabs (Diazepam) .Marland Kitchen.. 1 By Mouth Once Daily As Needed 8)  Zyrtec Allergy 10 Mg Tabs (Cetirizine Hcl) .... As Needed 9)  Flonase 50 Mcg/act Susp (Fluticasone Propionate) .... 2 Sprays Each Side As Needed  Allergies (verified): 1)  ! Pcn 2)  ! Zoloft 3)  ! Reglan  Past History:  Past Medical History: 1. Hyperlipidemia. 2. Type 2 diabetes. 3. Gastroesophageal reflux disease. 4. Hypertension. 5. Chronic C-spine pain. 6. Colonic polyps. 7. Low back pain.  The patient is status post spinal fusion surgeries. 8. Hereditary hemochromatosis: Compound heterozygote.  Patient gets periodic phlebotomies 10. Anxiety. 11. Chest pain:  Exercise/adenosine Myoview showed EF of 50% (3/10).  There was inferior hypokinesis.  On perfusion images, there was inferior scar and ischemia in the apical anterior septum and in the inferior wall.  This is suggestive of a coronary disease.  LHC (3/10) showed only luminal irregularities in the coronaries with EF 55%, suggesting that myoview was a false positive, likely from diaphragmatic attenuation.   12. Multifocal atrial tachycardia/wandering atrial pacemaker.  13. Cardiomyopathy: echo (5/11) showed EF 30% with diffuse hypokinesis, normal wall thickness, mildly decreased RV systolic function.  This may be a cardiomyopathy due to hemochromatosis.   Family History: Reviewed history from 01/16/2009 and no changes required. mother with colon polyps, MS Father with HTN, ETOH  sister with MS No FH of Colon Cancer: Family History of Diabetes: Mother, PGM, PGF  Social History: Reviewed history from 01/02/2010 and no changes required. disabled with back pain Married, lives in [place].  Former Smoker-stopped about 7 years ago Alcohol use-yes-3-4 drinks per month 3 children Drug Use - yes - quit in 1980 Daily Caffeine Use-3 drinks daily Patient does  not get regular exercise.   Vital Signs:  Patient profile:   55 year old male Height:      70 inches Weight:      225 pounds BMI:     32.40 Pulse rate:   101 / minute Pulse rhythm:   irregular BP sitting:   120 / 90  (left arm) Cuff size:   large  Vitals Entered By: Judithe Modest CMA (Jan 22, 2010 9:48 AM)  Physical Exam  General:  Well developed, well nourished, in no acute distress. Neck:  Neck supple, no JVD. No masses, thyromegaly or abnormal cervical nodes. Lungs:  Clear bilaterally to auscultation and percussion. Heart:  Non-displaced PMI, chest non-tender; regular rate and rhythm, S1, S2 without murmurs, rubs or gallops. Carotid upstroke normal, no bruit.  Pedals normal pulses. No edema, no varicosities. Abdomen:  Bowel sounds positive; abdomen soft and non-tender without masses, organomegaly, or hernias noted. No hepatosplenomegaly. Extremities:  No clubbing or cyanosis. Neurologic:  Alert and oriented x 3. Psych:  Normal affect.   Impression & Recommendations:  Problem # 1:  CHRONIC SYSTOLIC HEART FAILURE (ICD-428.22) Patient was noted to have EF 30% with diffuse hypokinesis on recent echo.  He had a cath last year with only luminal irregularities in his coronaries.  He may have a cardiomyopathy due to hemochromatosis, which may improve over time with control of iron store by phlebotomy.  His last ferritin level was normal (23).  He is not short of breath per se but has significant fatigue.  He does not appear volume overloaded on exam.  I am going to set him up for a cardiac MRI to look for evidence of infiltrative disease (hemochromatosis).  I will continue his lisinopril and stop his lopressor.  Instead, I will start him on Toprol XL 50 mg two times a day to try to control his arrhythmia and also for systolic dysfunction.    Problem # 2:  TACHYCARDIA (ICD-785.0) Patient has multifocal atrial tachycardia and wandering atrial pacemaker on ECG.  Hemochromatosis with  infiltration of the atria may be the cause of this arrhythmia.  No atrial fibrillation noted so far.  As above, will put him on Toprol XL 50 mg two times a day to try to control the arrhythmia.  I will set him up for a 48 hour holter to be worn while on Toprol XL to assess burden of arrhythmia.    Problem # 3:  HEMOCHROMATOSIS (ICD-275.0) Per Dr. Jarold Motto, getting periodic phlebotomy.  Fe stores appear to have normalized.   Problem # 4:  CHEST PAIN (ICD-786.50) Chest pressure after meals may be due to GERD.  Protonix has worked better than omeprazole for GERD in the past, will try to get him on this (Protonix 40 mg two times a day).  He had an unremarkable cath in 3/10.  Continue ASA.    Other Orders: EKG w/ Interpretation (93000) Cardiac MRI (Cardiac MRI) TLB-BNP (B-Natriuretic Peptide) (83880-BNPR) TLB-BMP (Basic Metabolic Panel-BMET) (80048-METABOL) Holter Monitor (Holter Monitor)  Patient Instructions: 1)  Your physician has recommended you make the following change in your medication:  2)  Stop Metoprolol 3)  Start ToprolXL 50mg  twice a day 4)  Start Protonix 40mg  twice a day 5)  Your physician recommends that you have lab today--BMP/BNP 428.22   785.0   786.50 6)  Your physician has requested that you have a cardiac MRI.  Cardiac MRI uses a computer to create images of your heart as it's beating, producing both still and moving pictures of your heart and major blood vessels. For further information please visit  [email or web address]  Please follow the instruction sheet given to you today for more information.  Take Valium 5mg  30 minutes before the MRI--you have the prescription 7)  Your physician recommends that you schedule a follow-up appointment in: 1 month with Dr Shirlee Latch. Prescriptions: PROTONIX 40 MG TBEC (PANTOPRAZOLE SODIUM) one tablet twice a day  #60 x 6   Entered by:   Katina Dung, RN, BSN   Authorized by:   Marca Ancona, MD   Signed by:   Katina Dung, RN, BSN on  01/22/2010   Method used:   Electronically to        [facility] (retail)       [address]       [place], [place]  [address]       Ph: [phone]       Fax: [phone]   RxID:   [id number] TOPROL XL 50 MG XR24H-TAB (METOPROLOL SUCCINATE) one twice a day  #60 x 6   Entered by:   Katina Dung, RN, BSN   Authorized by:   Marca Ancona, MD   Signed by:   Katina Dung, RN, BSN on 01/22/2010   Method used:   Electronically to        [facility] (retail)       [address]       [place], [place]  [address]       Ph: [phone]       Fax: [phone]   RxID:   [id number]

## 2010-10-15 NOTE — Letter (Signed)
Summary: Appointment - Cardiac MRI  [facility] Card[facility]     [place], [place]    Phone:   Fax:       Jan 22, 2010 MRN: [id number]   [facility] [address] [place], [place]  [address]   Dear Mr. HILLMAN,   We have scheduled the above patient for an appointment for a Cardiac MRI on June 1,2011  at 10:00a.m.  Please refer to the below information for the location and instructions for this test:  Location:     [facility]       [address]       [place], [place]  [address] Instructions:    [facility] Outpatient Registration 45 minutes prior to your appointment time.  This will ensure you are in the Radiology Department 30 minutes prior to your appointment.    There are no restrictions for this test you may eat and take medications as usual.  If you need to reschedule this appointment please call at the number listed above.   Sincerely,         Lorne Skeens  [facility] Scheduling Team

## 2010-10-15 NOTE — Assessment & Plan Note (Signed)
Summary: 6 MO ROV /NWS  #   Vital Signs:  Patient profile:   55 year old male Height:      70.5 inches Weight:      222 pounds BMI:     31.52 O2 Sat:      95 % on Room air Temp:     97.9 degrees F oral Pulse rate:   70 / minute BP sitting:   100 / 72  (left arm) Cuff size:   regular  Vitals Entered By: Zella Ball Ewing CMA Duncan Dull) (May 09, 2010 8:17 AM)  O2 Flow:  Room air  CC: 6 month ROV/RE   Primary Care Provider:  Oliver Barre, MD  CC:  6 month ROV/RE.  History of Present Illness: here to f/u - has been taking 2 of the metformin only due to GI upset;  overall o/w doing well;  last iron labs done in jan 2011 with ? f/u due in july;  Pt denies CP, worsening sob, doe, wheezing, orthopnea, pnd, worsening LE edema, palps, dizziness or syncope  Pt denies new neuro symptoms such as headache, facial or extremity weakness  Pt denies polydipsia, polyuria, or low sugar symptoms such as shakiness improved with eating.  Overall good compliance with meds, trying to follow low chol, DM diet, wt stable, little excercise however  No fever, wt loss, night sweats, loss of appetite or other constitutional symptoms  Overall good complaicne, good tolerance  Problems Prior to Update: 1)  Tachycardia  (ICD-785.0) 2)  Chronic Systolic Heart Failure  (ICD-428.22) 3)  Cervicalgia  (ICD-723.1) 4)  Other Specified Arthropathy Multiple Sites  (ICD-716.89) 5)  Hemochromatosis  (ICD-275.0) 6)  Obesity, Unspecified  (ICD-278.00) 7)  Abnormal Stress Electrocardiogram  (ICD-794.31) 8)  Chest Pain  (ICD-786.50) 9)  Transaminases, Serum, Elevated  (ICD-790.4) 10)  Preventive Health Care  (ICD-V70.0) 11)  Dysfunction of Eustachian Tube  (ICD-381.81) 12)  Preventive Health Care  (ICD-V70.0) 13)  Otitis Media, Serous  (ICD-381.4) 14)  Preventive Health Care  (ICD-V70.0) 15)  Peptic Ulcer Disease  (ICD-533.90) 16)  Anxiety  (ICD-300.00) 17)  Low Back Pain  (ICD-724.2) 18)  Diabetes Mellitus, Type II   (ICD-250.00) 19)  Fatty Liver Disease, Hx of  (ICD-V12.79) 20)  Polyp, Gallbladder  (ICD-575.8) 21)  Hypertension  (ICD-401.9) 22)  Hyperlipidemia  (ICD-272.4) 23)  Gerd  (ICD-530.81) 24)  Colonic Polyps, Hx of  (ICD-V12.72)  Medications Prior to Update: 1)  Omeprazole 20 Mg Cpdr (Omeprazole) .... 2 Po Once Daily 2)  Toprol Xl 50 Mg Xr24h-Tab (Metoprolol Succinate) .... One Tablet By Mouth  Twice A Day 3)  Ecotrin Low Strength 81 Mg  Tbec (Aspirin) .Marland Kitchen.. 1po Qd 4)  Tramadol Hcl 50 Mg Tabs (Tramadol Hcl) .Marland Kitchen.. 1 By Mouth Qid As Needed Pain 5)  Metformin Hcl 500 Mg Xr24h-Tab (Metformin Hcl) .... 2 Tablets Once Daily 6)  Lisinopril 10 Mg Tabs (Lisinopril) .... One Daily 7)  Zyrtec Allergy 10 Mg Tabs (Cetirizine Hcl) .... As Needed 8)  Flonase 50 Mcg/act Susp (Fluticasone Propionate) .... 2 Sprays Each Side As Needed 9)  Protonix 40 Mg Tbec (Pantoprazole Sodium) .... One Tablet Daily 10)  Amiodarone Hcl 200 Mg Tabs (Amiodarone Hcl) .... Take One-Half  Tablet Once Daily 11)  Toprol Xl 25 Mg Xr24h-Tab (Metoprolol Succinate) .... Take One Tablet By Mouth Twice A Day.  Current Medications (verified): 1)  Omeprazole 20 Mg Cpdr (Omeprazole) .... 2 Po Once Daily 2)  Toprol Xl 50 Mg Xr24h-Tab (Metoprolol Succinate) .Marland KitchenMarland KitchenMarland Kitchen  One Tablet By Mouth  Twice A Day 3)  Ecotrin Low Strength 81 Mg  Tbec (Aspirin) .Marland Kitchen.. 1po Qd 4)  Tramadol Hcl 50 Mg Tabs (Tramadol Hcl) .Marland Kitchen.. 1 By Mouth Qid As Needed Pain 5)  Metformin Hcl 500 Mg Xr24h-Tab (Metformin Hcl) .... 2 Tablets Once Daily 6)  Lisinopril 10 Mg Tabs (Lisinopril) .... One Daily 7)  Zyrtec Allergy 10 Mg Tabs (Cetirizine Hcl) .... As Needed 8)  Flonase 50 Mcg/act Susp (Fluticasone Propionate) .... 2 Sprays Each Side As Needed 9)  Protonix 40 Mg Tbec (Pantoprazole Sodium) .... One Tablet Daily 10)  Amiodarone Hcl 200 Mg Tabs (Amiodarone Hcl) .... Take One-Half  Tablet Once Daily 11)  Toprol Xl 25 Mg Xr24h-Tab (Metoprolol Succinate) .... Take One Tablet By Mouth  Twice A Day. 12)  Welchol 3.75 Gm Pack (Colesevelam Hcl) .Marland Kitchen.. 1 Pk By Mouth Once Daily in Water  Allergies (verified): 1)  ! Pcn 2)  ! Zoloft 3)  ! Reglan  Past History:  Past Medical History: Last updated: 03/01/2010 1. Hyperlipidemia. 2. Type 2 diabetes. 3. Gastroesophageal reflux disease. 4. Hypertension. 5. Chronic C-spine pain. 6. Colonic polyps. 7. Low back pain.  The patient is status post spinal fusion surgeries. 8. Hereditary hemochromatosis: Compound heterozygote.  Patient gets periodic phlebotomies 10. Anxiety. 11. Chest pain:  Exercise/adenosine Myoview showed EF of 50% (3/10).  There was inferior hypokinesis.  On perfusion images, there was inferior scar and ischemia in the apical anterior septum and in the inferior wall.  This is suggestive of a coronary disease.  LHC (3/10) showed only luminal irregularities in the coronaries with EF 55%, suggesting that myoview was a false positive, likely from diaphragmatic attenuation.   12. Multifocal atrial tachycardia/wandering atrial pacemaker.  Holter monitor in 5/11 showed frequent runs of MAT with heart rate around 100.  Patient is now on amiodarone to try to suppress MAT.  PFTs (6/11): FVC 105%, FEV1 109%, TLC 112%, DLCO 87% (minimal obstructive defect).  LFTs stable on amiodarone.  13. Cardiomyopathy: echo (5/11) showed EF 30% with diffuse hypokinesis, normal wall thickness, mildly decreased RV systolic function.  This may be a cardiomyopathy due to hemochromatosis versus tachy-mediated.  Unable to get cardiac MRI due to claustrophobia.   Past Surgical History: Last updated: 01/16/2009 Inguinal herniorrhaphy s/p back surgery 1991 - lumbar Tonsillectomy  Social History: Last updated: 01/02/2010 disabled with back pain Married, lives in [place].  Former Smoker-stopped about 7 years ago Alcohol use-yes-3-4 drinks per month 3 children Drug Use - yes - quit in 1980 Daily Caffeine Use-3 drinks daily Patient does not  get regular exercise.   Risk Factors: Exercise: no (01/16/2009)  Risk Factors: Smoking Status: quit (11/02/2007)  Review of Systems       all otherwise negative per pt -    Physical Exam  General:  alert and well-developed.   Head:  normocephalic and atraumatic.   Eyes:  vision grossly intact, pupils equal, and pupils round.   Ears:  R ear normal and L ear normal.   Nose:  no external deformity and no nasal discharge.   Mouth:  no gingival abnormalities and pharynx pink and moist.   Neck:  supple and no masses.   Lungs:  normal respiratory effort and normal breath sounds.   Heart:  normal rate and regular rhythm.   Extremities:  no edema, no erythema    Impression & Recommendations:  Problem # 1:  HYPERTENSION (ICD-401.9)  His updated medication list for this problem includes:  Toprol Xl 50 Mg Xr24h-tab (Metoprolol succinate) ..... One tablet by mouth  twice a day    Lisinopril 10 Mg Tabs (Lisinopril) ..... One daily    Toprol Xl 25 Mg Xr24h-tab (Metoprolol succinate) .Marland Kitchen... Take one tablet by mouth twice a day.  BP today: 100/72 Prior BP: 134/96 (04/03/2010)  Labs Reviewed: K+: 5.3 (04/17/2010) Creat: : 1.2 (04/17/2010)   Chol: 206 (04/17/2010)   HDL: 27.80 (04/17/2010)   LDL: 79 (11/30/2008)   TG: 233.0 (04/17/2010) stable overall by hx and exam, ok to continue meds/tx as is   Problem # 2:  HYPERLIPIDEMIA (ICD-272.4)  statin not an option due to liver /hemochromatosis - will add welchol daily, consider add niaspan; goal ldl< 70 with hx of heart dz;  indicnetly welchol should help with a1c reduction as well   His updated medication list for this problem includes:    Welchol 3.75 Gm Pack (Colesevelam hcl) .Marland Kitchen... 1 pk by mouth once daily in water  Problem # 3:  DIABETES MELLITUS, TYPE II (ICD-250.00)  His updated medication list for this problem includes:    Ecotrin Low Strength 81 Mg Tbec (Aspirin) .Marland Kitchen... 1po qd    Metformin Hcl 500 Mg Xr24h-tab (Metformin hcl)  .Marland Kitchen... 2 tablets once daily    Lisinopril 10 Mg Tabs (Lisinopril) ..... One daily  Labs Reviewed: Creat: 1.2 (04/17/2010)    Reviewed HgBA1c results: 6.9 (04/17/2010)  7.5 (11/01/2009) stable overall by hx and exam, ok to continue meds/tx as is , addition of welchol should help  Problem # 4:  HEMOCHROMATOSIS (ICD-275.0) ? due for f/u labs - will ask Dr Jarold Motto to review  Complete Medication List: 1)  Omeprazole 20 Mg Cpdr (Omeprazole) .... 2 po once daily 2)  Toprol Xl 50 Mg Xr24h-tab (Metoprolol succinate) .... One tablet by mouth  twice a day 3)  Ecotrin Low Strength 81 Mg Tbec (Aspirin) .Marland Kitchen.. 1po qd 4)  Tramadol Hcl 50 Mg Tabs (Tramadol hcl) .Marland Kitchen.. 1 by mouth qid as needed pain 5)  Metformin Hcl 500 Mg Xr24h-tab (Metformin hcl) .... 2 tablets once daily 6)  Lisinopril 10 Mg Tabs (Lisinopril) .... One daily 7)  Zyrtec Allergy 10 Mg Tabs (Cetirizine hcl) .... As needed 8)  Flonase 50 Mcg/act Susp (Fluticasone propionate) .... 2 sprays each side as needed 9)  Protonix 40 Mg Tbec (Pantoprazole sodium) .... One tablet daily 10)  Amiodarone Hcl 200 Mg Tabs (Amiodarone hcl) .... Take one-half  tablet once daily 11)  Toprol Xl 25 Mg Xr24h-tab (Metoprolol succinate) .... Take one tablet by mouth twice a day. 12)  Welchol 3.75 Gm Pack (Colesevelam hcl) .Marland Kitchen.. 1 pk by mouth once daily in water  Patient Instructions: 1)  Please take all new medications as prescribed - the welchol 2)  Continue all previous medications as before this visit 3)  Please schedule a follow-up appointment in 6 months with CPX labs and: 4)  HbgA1C prior to visit, ICD-9: 250.02 5)  Urine Microalbumin prior to visit, ICD-9: Prescriptions: WELCHOL 3.75 GM PACK (COLESEVELAM HCL) 1 pk by mouth once daily in water  #90 x 3   Entered and Authorized by:   Corwin Levins MD   Signed by:   Corwin Levins MD on 05/09/2010   Method used:   Print then Give to Patient   RxID:   [id number]

## 2010-10-15 NOTE — Assessment & Plan Note (Signed)
Summary: f1y  Medications Added ZYRTEC ALLERGY 10 MG TABS (CETIRIZINE HCL) as needed FLONASE 50 MCG/ACT SUSP (FLUTICASONE PROPIONATE) 2 sprays each side as needed      Allergies Added:   Primary Provider:  Oliver Barre, MD  CC:  follo w up 1 year. Pt has no cardiac concerns at this time.  History of Present Illness: 55 yo with h/o DM2, hyperlipidemia presents and left heart cath in 3/10 showing mild luminal irregularities presents for followup.  Since I last saw him, patient was diagnosed with hereditary hemochromatosis and has been getting periodic phlebotomies.  He has been doing fairly well.  He gets weak and fatigued after phlebotomies but otherwise is doing ok. He can climb a flight of steps without dyspnea.  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  Back pain limits his exercise, he is able to walk some though.  He is not taking a statin because of elevated LFTs.    Labs (3/10):  hepatitis panel negative, LDL 79, HDL 25, TG 192, AST 79, ALT 115 Labs (4/11): ferritin 23  ECG:  NSR, normal  Current Medications (verified): 1)  Omeprazole 20 Mg Cpdr (Omeprazole) .... 2 Po Once Daily 2)  Metoprolol Tartrate 25 Mg Tabs (Metoprolol Tartrate) .Marland Kitchen.. 1po Two Times A Day 3)  Ecotrin Low Strength 81 Mg  Tbec (Aspirin) .Marland Kitchen.. 1po Qd 4)  Tramadol Hcl 50 Mg Tabs (Tramadol Hcl) .Marland Kitchen.. 1 By Mouth Qid As Needed Pain 5)  Metformin Hcl 500 Mg Xr24h-Tab (Metformin Hcl) .... 3 By Mouth Once Daily 6)  Lisinopril 5 Mg Tabs (Lisinopril) .... One By Mouth Daliy 7)  Diazepam 5 Mg Tabs (Diazepam) .Marland Kitchen.. 1 By Mouth Once Daily As Needed 8)  Zyrtec Allergy 10 Mg Tabs (Cetirizine Hcl) .... As Needed 9)  Flonase 50 Mcg/act Susp (Fluticasone Propionate) .... 2 Sprays Each Side As Needed  Allergies (verified): 1)  ! Pcn 2)  ! Zoloft 3)  ! Reglan  Past History:  Past Medical History: 1. Hyperlipidemia. 2. Type 2 diabetes. 3. Gastroesophageal reflux disease. 4. Hypertension. 5.  Chronic C-spine pain. 6. Colonic polyps. 7. Low back pain.  The patient is status post spinal fusion surgeries. 8. Hereditary hemochromatosis: Compound heterozygote.  Patient gets periodic phlebotomies 10. Anxiety. 11. Chest pain:  Exercise/adenosine Myoview showed EF of 50% (3/10).  There was inferior hypokinesis.  On perfusion images, there was inferior scar and ischemia in the apical anterior septum and in the inferior wall.  This is suggestive of a coronary disease.  LHC (3/10) showed only luminal irregularities in the coronaries with EF 55%, suggesting that myoview was a false positive, likely from diaphragmatic attenuation.    Family History: Reviewed history from 01/16/2009 and no changes required. mother with colon polyps, MS Father with HTN, ETOH sister with MS No FH of Colon Cancer: Family History of Diabetes: Mother, PGM, PGF  Social History: disabled with back pain Married, lives in [place].  Former Smoker-stopped about 7 years ago Alcohol use-yes-3-4 drinks per month 3 children Drug Use - yes - quit in 1980 Daily Caffeine Use-3 drinks daily Patient does not get regular exercise.   Review of Systems       All systems reviewed and negative except as per HPI.   Vital Signs:  Patient profile:   55 year old male Height:      70 inches Weight:      220 pounds BMI:     31.68 Pulse rate:  79 / minute Pulse rhythm:   regular BP sitting:   130 / 94  (left arm) Cuff size:   large  Vitals Entered By: Judithe Modest CMA (January 02, 2010 9:56 AM)  Physical Exam  General:  Well developed, well nourished, in no acute distress. Mildly obese.  Neck:  Neck supple, no JVD. No masses, thyromegaly or abnormal cervical nodes. Lungs:  Clear bilaterally to auscultation and percussion. Heart:  Non-displaced PMI, chest non-tender; regular rate and rhythm, S1, S2 without murmurs, rubs or gallops. Carotid upstroke normal, no bruit.  Pedals normal pulses. No edema, no  varicosities. Abdomen:  Bowel sounds positive; abdomen soft and non-tender without masses, organomegaly, or hernias noted. No hepatosplenomegaly. Extremities:  No clubbing or cyanosis. Neurologic:  Alert and oriented x 3. Psych:  Normal affect.   Impression & Recommendations:  Problem # 1:  CHEST PAIN (ICD-786.50) No significant chest pain.  Had cath with luminal irregularities only in 3/10. Continue ASA.   Problem # 2:  HEMOCHROMATOSIS (ICD-275.0) Patient has history of hemochromatosis.  He does have what he considers excessive fatigue but no significant dyspnea.  As hemochromatosis can lead to a cardiomyopathy, I will get an echocardiogram to make sure LV function appears normal.   Problem # 3:  HYPERLIPIDEMIA (ICD-272.4) Given history of diabetes, would like LDL at least < 100.  If ok with GI, would suggest restarting low-dose statin (perhaps pravastatin 40 mg daily) with careful followup of LFTs.   Other Orders: EKG w/ Interpretation (93000) Echocardiogram (Echo)  Patient Instructions: 1)  Your physician recommends that you schedule a follow-up appointment in: ONE YEAR 2)  Your physician recommends that you continue on your current medications as directed. Please refer to the Current Medication list given to you today. 3)  Your physician has requested that you have an echocardiogram.  Echocardiography is a painless test that uses sound waves to create images of your heart. It provides your doctor with information about the size and shape of your heart and how well your heart's chambers and valves are working.  This procedure takes approximately one hour. There are no restrictions for this procedure.

## 2010-10-15 NOTE — Progress Notes (Signed)
Summary: Calling regarding medication   Phone Note Call from Patient Call back at Home Phone [phone]   Caller: Patient Summary of Call: Request call about medication (Metoprolol) Initial call taken by: Judie Grieve,  April 16, 2010 8:27 AM  Follow-up for Phone Call        AWAITING PRIOR AUTHORIZATION FORM  FROM Dayle Points, LPN  April 16, 2010 9:32 AM+  RECIEVED PRIOR AUTH FORM AWAITING DR [facility] TO SIGN. Follow-up by: Scherrie Bateman, LPN,  April 16, 2010 1:12 PM     Appended Document: Calling regarding medication Would have DOD sign as I am gone this week.   Appended Document: Calling regarding medication Dr. Riley Kill signed PA form. Faxed to [facility] today. I will send to medical records to have the form scanned to the pt's chart.

## 2010-10-15 NOTE — Assessment & Plan Note (Signed)
Summary: 1 month rov/sl  Medications Added LISINOPRIL 10 MG TABS (LISINOPRIL) one daily      Allergies Added:   Primary Provider:  Oliver Barre, MD  CC:  follow up.  Pt states he is feeling pretty good.  Follow up to medicine changes.  .  History of Present Illness: 55 yo with h/o DM2, hyperlipidemia, left heart cath in 3/10 showing mild luminal irregularities, hereditary hemochromatosis getting phlebotomy treatment, and atrial tachycardia presents for followup.  Additionally, EF was noted to be depressed on recent echo (estimated at 30%).  A couple of months ago, he was having very bothersome tachypalpitations likely due to frequent atrial tachycardia.  I started him on Toprol XL and amiodarone.  This combination has resolved his palpitations and he feels like he has more energy.  Initially, he had GI discomfort on amiodarone but this has completely resolved after decreasing his dose to 100 mg daily.   He can climb a flight of steps without dyspnea.  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  Back pain limits his exercise but he denies shortness of breath while walking.  He is not taking a statin because of elevated LFTs.  LFTs have been stable since starting amiodarone.  No chest pain.  Weight is up 3 lbs since last appointment.   Labs (3/10):  hepatitis panel negative, LDL 79, HDL 25, TG 192, AST 79, ALT 115 Labs (4/11): ferritin  Labs (5/11): BNP 21 Labs (6/11): TSH normal, AST 43, ALT 61  Current Medications (verified): 1)  Omeprazole 20 Mg Cpdr (Omeprazole) .... 2 Po Once Daily 2)  Toprol Xl 50 Mg Xr24h-Tab (Metoprolol Succinate) .... One and One-Half  Twice A Day 3)  Ecotrin Low Strength 81 Mg  Tbec (Aspirin) .Marland Kitchen.. 1po Qd 4)  Tramadol Hcl 50 Mg Tabs (Tramadol Hcl) .Marland Kitchen.. 1 By Mouth Qid As Needed Pain 5)  Metformin Hcl 500 Mg Xr24h-Tab (Metformin Hcl) .... 2 Tablets Once Daily 6)  Lisinopril 5 Mg Tabs (Lisinopril) .... One By Mouth Daliy 7)  Zyrtec Allergy 10  Mg Tabs (Cetirizine Hcl) .... As Needed 8)  Flonase 50 Mcg/act Susp (Fluticasone Propionate) .... 2 Sprays Each Side As Needed 9)  Protonix 40 Mg Tbec (Pantoprazole Sodium) .... One Tablet Daily 10)  Amiodarone Hcl 200 Mg Tabs (Amiodarone Hcl) .... Take One-Half  Tablet Once Daily  Allergies (verified): 1)  ! Pcn 2)  ! Zoloft 3)  ! Reglan  Past History:  Past Medical History: Reviewed history from 03/01/2010 and no changes required. 1. Hyperlipidemia. 2. Type 2 diabetes. 3. Gastroesophageal reflux disease. 4. Hypertension. 5. Chronic C-spine pain. 6. Colonic polyps. 7. Low back pain.  The patient is status post spinal fusion surgeries. 8. Hereditary hemochromatosis: Compound heterozygote.  Patient gets periodic phlebotomies 10. Anxiety. 11. Chest pain:  Exercise/adenosine Myoview showed EF of 50% (3/10).  There was inferior hypokinesis.  On perfusion images, there was inferior scar and ischemia in the apical anterior septum and in the inferior wall.  This is suggestive of a coronary disease.  LHC (3/10) showed only luminal irregularities in the coronaries with EF 55%, suggesting that myoview was a false positive, likely from diaphragmatic attenuation.   12. Multifocal atrial tachycardia/wandering atrial pacemaker.  Holter monitor in 5/11 showed frequent runs of MAT with heart rate around 100.  Patient is now on amiodarone to try to suppress MAT.  PFTs (6/11): FVC 105%, FEV1 109%, TLC 112%, DLCO 87% (minimal obstructive defect).  LFTs  stable on amiodarone.  13. Cardiomyopathy: echo (5/11) showed EF 30% with diffuse hypokinesis, normal wall thickness, mildly decreased RV systolic function.  This may be a cardiomyopathy due to hemochromatosis versus tachy-mediated.  Unable to get cardiac MRI due to claustrophobia.   Family History: Reviewed history from 01/16/2009 and no changes required. mother with colon polyps, MS Father with HTN, ETOH sister with MS No FH of Colon Cancer: Family  History of Diabetes: Mother, PGM, PGF  Social History: Reviewed history from 01/02/2010 and no changes required. disabled with back pain Married, lives in [place].  Former Smoker-stopped about 7 years ago Alcohol use-yes-3-4 drinks per month 3 children Drug Use - yes - quit in 1980 Daily Caffeine Use-3 drinks daily Patient does not get regular exercise.   Review of Systems       All systems reviewed and negative except as per HPI.   Vital Signs:  Patient profile:   55 year old male Height:      70 inches Weight:      227 pounds BMI:     32.69 Pulse rate:   72 / minute Pulse rhythm:   regular BP sitting:   134 / 96  (left arm) Cuff size:   large  Vitals Entered By: Judithe Modest CMA (April 03, 2010 8:44 AM)  Physical Exam  General:  Well developed, well nourished, in no acute distress. Obese.  Neck:  Neck supple, no JVD. No masses, thyromegaly or abnormal cervical nodes. Lungs:  Clear bilaterally to auscultation and percussion. Heart:  Non-displaced PMI, chest non-tender; regular rate and rhythm, S1, S2 without murmurs, rubs or gallops. Carotid upstroke normal, no bruit.  Pedals normal pulses. No edema, no varicosities. Abdomen:  Bowel sounds positive; abdomen soft and non-tender without masses, organomegaly, or hernias noted. No hepatosplenomegaly. Extremities:  No clubbing or cyanosis. Neurologic:  Alert and oriented x 3. Psych:  Normal affect.   Impression & Recommendations:  Problem # 1:  CHRONIC SYSTOLIC HEART FAILURE (ICD-428.22) Patient was noted to have EF 30% with diffuse hypokinesis on recent echo.  He had a cath last year with only luminal irregularities in his coronaries.  He may have a cardiomyopathy due to hemochromatosis, which may improve over time with control of iron stores by phlebotomy.  His last ferritin level was normal (23).  Another possibility is tachycardia-mediated cardiomyopathy given frequent MAT.  This should improve with maintenance of  NSR.  He is not short of breath and does not appear volume overloaded on exam.  Continue Toprol XL at 75 mg two times a day.  Increase lisinopril to 10 mg daily and get BMET in 2 wks.  I will repeat an echo in 6 months on meds.    Problem # 2:  TACHYCARDIA (ICD-785.0) Patient has had multifocal atrial tachycardia and wandering atrial pacemaker noted by holter and ECG.  Hemochromatosis with infiltration of the atria may be the cause of this arrhythmia.  No atrial fibrillation noted so far.  He had a significant burden of MAT, so I now have him on amiodarone and Toprol XL.  This has resolved his palpitations.  He had GI discomfort on amiodarone that resolved when dose was decreased to 100 mg daily.  He will continue amiodarone.  LFTs have not risen and TSH was normal.  I will get repeat LFTs today.   Other Orders: Echocardiogram (Echo)  Patient Instructions: 1)  Your physician has recommended you make the following change in your medication:  2)  Increase  Lisinopril to 10mg  daily--you can take two 5mg  tablets daily. 3)  Your physician recommends that you return for lab work in: 2weeks--[id number] 785.0 4)  Your physician recommends that you schedule a follow-up appointment in: 2 months with Dr Shirlee Latch. 5)  Your physician has requested that you have an echocardiogram.  Echocardiography is a painless test that uses sound waves to create images of your heart. It provides your doctor with information about the size and shape of your heart and how well your heart's chambers and valves are working.  This procedure takes approximately one hour. There are no restrictions for this procedure. In 6 MONTHS Prescriptions: LISINOPRIL 10 MG TABS (LISINOPRIL) one daily  #30 x 11   Entered by:   Katina Dung, RN, BSN   Authorized by:   Marca Ancona, MD   Signed by:   Katina Dung, RN, BSN on 04/03/2010   Method used:   Electronically to        [facility] (retail)       [address]        [place], [place]  [address]       Ph: [phone]       Fax: [phone]   RxID:   [id number]

## 2010-10-15 NOTE — Progress Notes (Signed)
Summary: medication change   Phone Note Outgoing Call   Call placed by: Katina Dung, RN, BSN,  February 27, 2010 2:47 PM Call placed to: Patient  Follow-up for Phone Call        I had called pt to give him PFT results--he states he has been having alot of bloating in his stomach that feels like pressure pushing into his esophagus and chest-he feels that it is related to Amiodarone--he is not taking Protonix now because he could only get 30 tablets and that lasted him 2 weeks--I have reviewed with Dr Jamse Arn 60mg  is an alternative to Protonix 40mg  two times a day (this bid dosing requires preauthorization) according to Stanton Kidney at [facility] [phone]-it will be a Tier 3 copay-I have reveiwed with Dr Shirlee Latch and he has recommended trying Dexilant 60mg  once daily in the place  of Protonix 40mg  bid  to see if that will help pt's symptoms--LMTCB Katina Dung, RN, BSN  February 27, 2010 5:08 PM      Appended Document: medication change pt pt had OV with Dr Shirlee Latch 03-01-10--he decided to take Protonix 40mg  daily   Clinical Lists Changes

## 2010-10-15 NOTE — Progress Notes (Signed)
Summary: medications   Phone Note From Pharmacy   Caller: [facility].* Details for Reason: Medications Summary of Call: Would like to know if patient is to be taking protonix & omeprazole?  If so need this authorized. Initial call taken by: Bishop Dublin, CMA,  June 19, 2010 2:22 PM     Appended Document: medications Can take one or the other, not both  Appended Document: medications LMTCB to talk with pt about taking either protonix or omeprazole  Appended Document: medications Pt. aware.

## 2010-10-15 NOTE — Progress Notes (Signed)
Summary: Keith [name]   Phone Note Call from Patient   Caller: Patient Reason for Call: Insurance Question Summary of Call: Pt calling.  Had OV a few months ago with Dr. Jonny Ruiz and was told to check with Dr. Jarold Motto re when to repeat colon.  Last Colon was 11/15/03.  Please review.  OK to sch previsit and repeat colon? Initial call taken by: Ashok Cordia RN,  December 31, 2009 3:26 PM  Follow-up for Phone Call        DUE NOW Follow-up by: Mardella Layman MD FACG,  December 31, 2009 3:53 PM  Additional Follow-up for Phone Call Additional follow up Details #1::        Pt notified.  He will call back and sch previsit and colon. Additional Follow-up by: Ashok Cordia RN,  December 31, 2009 3:57 PM

## 2010-10-15 NOTE — Assessment & Plan Note (Signed)
Summary: 6 mos f/u $50 / cd   Vital Signs:  Patient profile:   55 year old male Height:      70 inches Weight:      229.25 pounds BMI:     33.01 O2 Sat:      97 % on Room air Temp:     98 degrees F oral Pulse rate:   86 / minute BP sitting:   110 / 70  (left arm) Cuff size:   regular  Vitals Entered ByZella Ball Ewing (November 08, 2009 8:13 AM)  O2 Flow:  Room air  CC: 6 MO followup/RE   Primary Care Provider:  Oliver Barre, MD  CC:  6 MO followup/RE.  History of Present Illness: here after flu like illness last wk now resolved but still fatigued;  not taking statin further due to liver issue and hemochormatosis;  due for colonoscopy;  besides the flu illness last wk, c/o 6 mo intermittent ST  - has been tx with zyrtec and steroid nasal spray resolved the ST, but has run out and ST has recurred; Pt denies CP, sob, doe, wheezing, orthopnea, pnd, worsening LE edema, palps, dizziness or syncope Pt denies new neuro symptoms such as headache, facial or extremity weakness .  Needs med replacement  for the darvocet.    Problems Prior to Update: 1)  Cervicalgia  (ICD-723.1) 2)  Other Specified Arthropathy Multiple Sites  (ICD-716.89) 3)  Hemochromatosis  (ICD-275.0) 4)  Obesity, Unspecified  (ICD-278.00) 5)  Abnormal Stress Electrocardiogram  (ICD-794.31) 6)  Chest Pain  (ICD-786.50) 7)  Transaminases, Serum, Elevated  (ICD-790.4) 8)  Preventive Health Care  (ICD-V70.0) 9)  Dysfunction of Eustachian Tube  (ICD-381.81) 10)  Preventive Health Care  (ICD-V70.0) 11)  Otitis Media, Serous  (ICD-381.4) 12)  Preventive Health Care  (ICD-V70.0) 13)  Peptic Ulcer Disease  (ICD-533.90) 14)  Anxiety  (ICD-300.00) 15)  Low Back Pain  (ICD-724.2) 16)  Diabetes Mellitus, Type II  (ICD-250.00) 17)  Fatty Liver Disease, Hx of  (ICD-V12.79) 18)  Polyp, Gallbladder  (ICD-575.8) 19)  Hypertension  (ICD-401.9) 20)  Hyperlipidemia  (ICD-272.4) 21)  Gerd  (ICD-530.81) 22)  Colonic Polyps, Hx of   (ICD-V12.72)  Medications Prior to Update: 1)  Omeprazole 20 Mg Cpdr (Omeprazole) .... 2 Po Once Daily 2)  Metoprolol Tartrate 25 Mg Tabs (Metoprolol Tartrate) .Marland Kitchen.. 1po Two Times A Day 3)  Ecotrin Low Strength 81 Mg  Tbec (Aspirin) .Marland Kitchen.. 1po Qd 4)  Darvocet-N 100 100-650 Mg  Tabs (Propoxyphene N-Apap) .Marland Kitchen.. 1po Qid As Needed 5)  Metformin Hcl 500 Mg  Tabs (Metformin Hcl) .... Take 2 Tablets By Mouth Once Daily 6)  Lisinopril 5 Mg Tabs (Lisinopril) .... One By Mouth Daliy 7)  Diazepam 5 Mg Tabs (Diazepam) .Marland Kitchen.. 1 By Mouth Once Daily As Needed  Current Medications (verified): 1)  Omeprazole 20 Mg Cpdr (Omeprazole) .... 2 Po Once Daily 2)  Metoprolol Tartrate 25 Mg Tabs (Metoprolol Tartrate) .Marland Kitchen.. 1po Two Times A Day 3)  Ecotrin Low Strength 81 Mg  Tbec (Aspirin) .Marland Kitchen.. 1po Qd 4)  Tramadol Hcl 50 Mg Tabs (Tramadol Hcl) .Marland Kitchen.. 1 By Mouth Qid As Needed Pain 5)  Metformin Hcl 500 Mg Xr24h-Tab (Metformin Hcl) .... 3 By Mouth Once Daily 6)  Lisinopril 5 Mg Tabs (Lisinopril) .... One By Mouth Daliy 7)  Diazepam 5 Mg Tabs (Diazepam) .Marland Kitchen.. 1 By Mouth Once Daily As Needed 8)  Zyrtec Allergy 10 Mg Tabs (Cetirizine Hcl) .Marland Kitchen.. 1 By Mouth Once  Daily 9)  Flonase 50 Mcg/act Susp (Fluticasone Propionate) .... 2 Sprays Each Side Once Daily  Allergies (verified): 1)  ! Pcn 2)  ! Zoloft 3)  ! Reglan  Past History:  Past Medical History: Last updated: 05/03/2009 1. Hyperlipidemia. 2. Type 2 diabetes. 3. Gastroesophageal reflux disease. 4. Hypertension. 5. Chronic C-spine pain. 6. Colonic polyps. 7. Low back pain.  The patient is status post spinal fusion surgeries. 8. History of probable nonalcoholic steatohepatitis.  The patient has     chronic elevated LFTs.  Viral hepatitis panel negative.  9. Peptic ulcer disease. 10.Anxiety. 11.Exercise/adenosine Myoview showed EF of 50% (3/10).  There was inferior     hypokinesis.  On perfusion images, there was inferior scar and     ischemia in the apical anterior  septum and in the inferior wall.     This is suggestive of a coronary disease.  LHC (3/10) showed only luminal irregularities in the coronaries with EF 55%, suggesting that myoview was a false positive, likely from diaphragmatic attenuation.   hemochormatosis  Past Surgical History: Last updated: 01/16/2009 Inguinal herniorrhaphy s/p back surgery 1991 - lumbar Tonsillectomy  Family History: Last updated: 01/16/2009 mother with colon polyps, MS Father with HTN, ETOH sister with MS No FH of Colon Cancer: Family History of Diabetes: Mother, PGM, PGF  Social History: Last updated: 01/16/2009 disabled with back pain Married Former Smoker-stopped about 7 years ago Alcohol use-yes-3-4 drinks per month 3 children Drug Use - yes - quit in 1980 Daily Caffeine Use-3 drinks daily Patient does not get regular exercise.   Risk Factors: Exercise: no (01/16/2009)  Risk Factors: Smoking Status: quit (11/02/2007)  Review of Systems  The patient denies anorexia, fever, weight loss, weight gain, vision loss, decreased hearing, hoarseness, chest pain, syncope, dyspnea on exertion, peripheral edema, prolonged cough, headaches, hemoptysis, melena, hematochezia, severe indigestion/heartburn, hematuria, incontinence, muscle weakness, suspicious skin lesions, difficulty walking, depression, unusual weight change, abnormal bleeding, enlarged lymph nodes, angioedema, breast masses, and testicular masses.         all otherwise negative per pt -  Physical Exam  General:  alert and overweight-appearing.   Head:  normocephalic and atraumatic.   Eyes:  vision grossly intact, pupils equal, and pupils round.   Ears:  R ear normal and L ear normal.   Nose:  no external deformity and no nasal discharge.   Mouth:  no gingival abnormalities and pharynx pink and moist.   Neck:  supple and no masses.   Lungs:  normal respiratory effort and normal breath sounds.   Heart:  normal rate and regular rhythm.     Abdomen:  soft, non-tender, and normal bowel sounds.   Msk:  no joint tenderness and no joint swelling.   Extremities:  no edema, no erythema  Neurologic:  cranial nerves II-XII intact and strength normal in all extremities.     Impression & Recommendations:  Problem # 1:  Preventive Health Care (ICD-V70.0) Overall doing well, age appropriate education and counseling updated and referral for appropriate preventive services done unless declined, immunizations up to date or declined, diet counseling done if overweight, urged to quit smoking if smokes , most recent labs reviewed and current ordered if appropriate, ecg reviewed or declined (interpretation per ECG scanned in the EMR if done); information regarding Medicare Prevention requirements given if appropriate   Problem # 2:  COLONIC POLYPS, HX OF (ICD-V12.72)  Orders: Gastroenterology Referral (GI) for colon f/u  Problem # 3:  DIABETES MELLITUS, TYPE II (  ICD-250.00)  His updated medication list for this problem includes:    Ecotrin Low Strength 81 Mg Tbec (Aspirin) .Marland Kitchen... 1po qd    Metformin Hcl 500 Mg Xr24h-tab (Metformin hcl) .Marland KitchenMarland KitchenMarland KitchenMarland Kitchen 3 by mouth once daily    Lisinopril 5 Mg Tabs (Lisinopril) ..... One by mouth daliy to incr the metformin to 3 per day  Labs Reviewed: Creat: 1.0 (11/01/2009)    Reviewed HgBA1c results: 7.5 (11/01/2009)  6.3 (04/23/2009)  Problem # 4:  HYPERLIPIDEMIA (ICD-272.4) to hold on statin for now due to liver dz with hemochromatosis  Complete Medication List: 1)  Omeprazole 20 Mg Cpdr (Omeprazole) .... 2 po once daily 2)  Metoprolol Tartrate 25 Mg Tabs (Metoprolol tartrate) .Marland Kitchen.. 1po two times a day 3)  Ecotrin Low Strength 81 Mg Tbec (Aspirin) .Marland Kitchen.. 1po qd 4)  Tramadol Hcl 50 Mg Tabs (Tramadol hcl) .Marland Kitchen.. 1 by mouth qid as needed pain 5)  Metformin Hcl 500 Mg Xr24h-tab (Metformin hcl) .... 3 by mouth once daily 6)  Lisinopril 5 Mg Tabs (Lisinopril) .... One by mouth daliy 7)  Diazepam 5 Mg Tabs  (Diazepam) .Marland Kitchen.. 1 by mouth once daily as needed 8)  Zyrtec Allergy 10 Mg Tabs (Cetirizine hcl) .Marland Kitchen.. 1 by mouth once daily 9)  Flonase 50 Mcg/act Susp (Fluticasone propionate) .... 2 sprays each side once daily  Other Orders: Flu Vaccine 60yrs + ([address]) Administration Flu vaccine - MCR (U0454)  Patient Instructions: 1)  you had the flu shot today 2)  stpo the darvocet as you have 3)  Please take all new medications as prescribed - the metformin is increased to 3 per day, and the tramadol for pain as needed  4)  Continue all previous medications as before this visit  5)  You will be contacted about the referral(s) to: colonoscopy 6)  Please schedule a follow-up appointment in 6 months with: 7)  BMP prior to visit, ICD-9: 250.02 8)  Lipid Panel prior to visit, ICD-9: 9)  HbgA1C prior to visit, ICD-9: Prescriptions: DIAZEPAM 5 MG TABS (DIAZEPAM) 1 by mouth once daily as needed  #30 x 1   Entered and Authorized by:   Corwin Levins MD   Signed by:   Corwin Levins MD on 11/08/2009   Method used:   Print then Give to Patient   RxID:   [id number] LISINOPRIL 5 MG TABS (LISINOPRIL) one by mouth daliy  #90 x 3   Entered and Authorized by:   Corwin Levins MD   Signed by:   Corwin Levins MD on 11/08/2009   Method used:   Print then Give to Patient   RxID:   [id number] METFORMIN HCL 500 MG XR24H-TAB (METFORMIN HCL) 3 by mouth once daily  #270 x 3   Entered and Authorized by:   Corwin Levins MD   Signed by:   Corwin Levins MD on 11/08/2009   Method used:   Print then Give to Patient   RxID:   [id number] METOPROLOL TARTRATE 25 MG TABS (METOPROLOL TARTRATE) 1po two times a day  #180 x 3   Entered and Authorized by:   Corwin Levins MD   Signed by:   Corwin Levins MD on 11/08/2009   Method used:   Print then Give to Patient   RxID:   [id number] OMEPRAZOLE 20 MG CPDR (OMEPRAZOLE) 2 po once daily  #180 x 3   Entered and Authorized by:   Corwin Levins MD   Signed by:  Corwin Levins MD  on 11/08/2009   Method used:   Print then Give to Patient   RxID:   [id number] TRAMADOL HCL 50 MG TABS (TRAMADOL HCL) 1 by mouth qid as needed pain  #120 x 3   Entered and Authorized by:   Corwin Levins MD   Signed by:   Corwin Levins MD on 11/08/2009   Method used:   Print then Give to Patient   RxID:   [id number]     Flu Vaccine Consent Questions     Do you have a history of severe allergic reactions to this vaccine? no    Any prior history of allergic reactions to egg and/or gelatin? no    Do you have a sensitivity to the preservative Thimersol? no    Do you have a past history of Guillan-Barre Syndrome? no    Do you currently have an acute febrile illness? no    Have you ever had a severe reaction to latex? no    Vaccine information given and explained to patient? yes    Are you currently pregnant? no    Lot Number:[id number]   Exp Date:03/14/2010   Site Given  Left Deltoid IMflu

## 2010-10-15 NOTE — Medication Information (Signed)
Summary: [employer] Prior Authorization Request Form   [employer] Prior Authorization Request Form   Imported By: Roderic Ovens 06/14/2010 16:30:27  _____________________________________________________________________  External Attachment:    Type:   Image     Comment:   External Document

## 2010-10-15 NOTE — Progress Notes (Signed)
   Phone Note Outgoing Call   Call placed by: Marca Ancona, MD,  Jan 29, 2010 9:22 AM Call placed to: Patient  Follow-up for Phone Call        I called Mr. Berns to review his holter monitor.  He has very frequent runs of MAT with rate to as high as the 150s despite Toprol XL 50 two times a day.  Average heart rate was 100.  It is possible that he has a tachycardia-mediated cardiomyopathy as it appears that his hemochromatosis is controlled now by phlebotomy.  I think that at this point he merits an antiarrhythmic to control his MAT.  If this does not work, would pursue ablation.  He is not a good candidate for flecainide or dronedarone with his low EF.  I will therefore start amiodarone.  He has a chronic elevation in his LFTs to < 3 times upper limit normal.  I will start amiodarone at a lower dose for a slow load (400 mg daily x 3 weeks, then 200 mg daily).  I will check LFTs at 2 weeks and at 4 weeks and frequently thereafter.  If he has a significant rise in LFTs, will stop the medication.  I will repeat a holter once the slow load is completed.  I will forward this note to Dr. Jarold Motto who follows Mr [facility] for GI.      Appended Document:  discussed with pt by telephone-he agreed to begin Amiodarone 400mg  daily x3 weeks the 200mg  daily--He will return for LFT and TSH  02-13-10 and will get LFT at the time of appt with Dr Shirlee Latch 03-06-10-PFT ordered-pt verbalized understanding   Clinical Lists Changes  Medications: Added new medication of AMIODARONE HCL 200 MG TABS (AMIODARONE HCL) two daily for 3 weeks then one daily - Signed Rx of AMIODARONE HCL 200 MG TABS (AMIODARONE HCL) two daily for 3 weeks then one daily;  #60 x 2;  Signed;  Entered by: Katina Dung, RN, BSN;  Authorized by: Marca Ancona, MD;  Method used: Electronically to [facility] [address], [place], [place], [place]  [address], Ph: [phone], Fax: [phone] Orders: Added new Referral  order of Pulmonary Function Test (PFT) - Signed    Prescriptions: AMIODARONE HCL 200 MG TABS (AMIODARONE HCL) two daily for 3 weeks then one daily  #60 x 2   Entered by:   Katina Dung, RN, BSN   Authorized by:   Marca Ancona, MD   Signed by:   Katina Dung, RN, BSN on 01/29/2010   Method used:   Electronically to        [facility] (retail)       [address]       [place], [place]  [address]       Ph: [phone]       Fax: [phone]   RxID:   [id number]

## 2010-10-15 NOTE — Assessment & Plan Note (Signed)
Summary: rov  Medications Added TOPROL XL 50 MG XR24H-TAB (METOPROLOL SUCCINATE) one and one-half  twice a day METFORMIN HCL 500 MG XR24H-TAB (METFORMIN HCL) 2 tablets once daily PROTONIX 40 MG TBEC (PANTOPRAZOLE SODIUM) one tablet twice a day-pt not taking due to insurance PROTONIX 40 MG TBEC (PANTOPRAZOLE SODIUM) one tablet daily AMIODARONE HCL 200 MG TABS (AMIODARONE HCL) take one tablet once daily AMIODARONE HCL 200 MG TABS (AMIODARONE HCL) take one-half  tablet once daily      Allergies Added:   Primary Provider:  Oliver Barre, MD  CC:  rov/  Pt feeling well.  History of Present Illness: 55 yo with h/o DM2, hyperlipidemia, left heart cath in 3/10 showing mild luminal irregularities, hereditary hemochromatosis getting phlebotomy treatment, and atrial tachycardia presents for followup.  Additionally, EF was noted to be depressed on recent echo (estimated at 30%).  At last appointment, he was having very bothersome tachypalpitations likely due to frequent atrial tachycardia.  I started him on Toprol XL and amiodarone.  This combination has resolved his palpitations and he feels like he has more energy.  Unfortunately, he seems to have developed some GI discomfort associated with heartburn-type symptoms over the last week that he is worried may be due to amiodarone.  Rolaids help some.    He can climb a flight of steps without dyspnea.  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  Back pain limits his exercise, he is able to walk some though.  He is not taking a statin because of elevated LFTs.  LFTs have been stable (actually lower) since starting amiodarone.    Patient unable to get cardiac MRI due to claustrophobia.   Labs (3/10):  hepatitis panel negative, LDL 79, HDL 25, TG 192, AST 79, ALT 115 Labs (4/11): ferritin  Labs (5/11): BNP 21 Labs (6/11): TSH normal, AST 43, ALT 61  Current Medications (verified): 1)  Omeprazole 20 Mg Cpdr (Omeprazole) ....  2 Po Once Daily 2)  Toprol Xl 50 Mg Xr24h-Tab (Metoprolol Succinate) .... One Twice A Day 3)  Ecotrin Low Strength 81 Mg  Tbec (Aspirin) .Marland Kitchen.. 1po Qd 4)  Tramadol Hcl 50 Mg Tabs (Tramadol Hcl) .Marland Kitchen.. 1 By Mouth Qid As Needed Pain 5)  Metformin Hcl 500 Mg Xr24h-Tab (Metformin Hcl) .... 2 Tablets Once Daily 6)  Lisinopril 5 Mg Tabs (Lisinopril) .... One By Mouth Daliy 7)  Zyrtec Allergy 10 Mg Tabs (Cetirizine Hcl) .... As Needed 8)  Flonase 50 Mcg/act Susp (Fluticasone Propionate) .... 2 Sprays Each Side As Needed 9)  Protonix 40 Mg Tbec (Pantoprazole Sodium) .... One Tablet Twice A Day-Pt Not Taking Due To Insurance 10)  Amiodarone Hcl 200 Mg Tabs (Amiodarone Hcl) .... Take One Tablet Once Daily  Allergies (verified): 1)  ! Pcn 2)  ! Zoloft 3)  ! Reglan  Past History:  Past Medical History: 1. Hyperlipidemia. 2. Type 2 diabetes. 3. Gastroesophageal reflux disease. 4. Hypertension. 5. Chronic C-spine pain. 6. Colonic polyps. 7. Low back pain.  The patient is status post spinal fusion surgeries. 8. Hereditary hemochromatosis: Compound heterozygote.  Patient gets periodic phlebotomies 10. Anxiety. 11. Chest pain:  Exercise/adenosine Myoview showed EF of 50% (3/10).  There was inferior hypokinesis.  On perfusion images, there was inferior scar and ischemia in the apical anterior septum and in the inferior wall.  This is suggestive of a coronary disease.  LHC (3/10) showed only luminal irregularities in the coronaries with EF 55%, suggesting that [facility]  was a false positive, likely from diaphragmatic attenuation.   12. Multifocal atrial tachycardia/wandering atrial pacemaker.  Holter monitor in 5/11 showed frequent runs of MAT with heart rate around 100.  Patient is now on amiodarone to try to suppress MAT.  PFTs (6/11): FVC 105%, FEV1 109%, TLC 112%, DLCO 87% (minimal obstructive defect).  LFTs stable on amiodarone.  13. Cardiomyopathy: echo (5/11) showed EF 30% with diffuse hypokinesis,  normal wall thickness, mildly decreased RV systolic function.  This may be a cardiomyopathy due to hemochromatosis versus tachy-mediated.  Unable to get cardiac MRI due to claustrophobia.   Family History: Reviewed history from 01/16/2009 and no changes required. mother with colon polyps, MS Father with HTN, ETOH sister with MS No FH of Colon Cancer: Family History of Diabetes: Mother, PGM, PGF  Social History: Reviewed history from 01/02/2010 and no changes required. disabled with back pain Married, lives in [place].  Former Smoker-stopped about 7 years ago Alcohol use-yes-3-4 drinks per month 3 children Drug Use - yes - quit in 1980 Daily Caffeine Use-3 drinks daily Patient does not get regular exercise.   Review of Systems       All systems reviewed and negative except as per HPI.   Vital Signs:  Patient profile:   55 year old male Height:      70 inches Weight:      224 pounds BMI:     32.26 Pulse rate:   72 / minute Pulse rhythm:   regular BP sitting:   118 / 88  (left arm) Cuff size:   regular  Vitals Entered By: Judithe Modest CMA (March 01, 2010 11:41 AM)  Physical Exam  General:  Well developed, well nourished, in no acute distress. Neck:  Neck supple, no JVD. No masses, thyromegaly or abnormal cervical nodes. Lungs:  Clear bilaterally to auscultation and percussion. Heart:  Non-displaced PMI, chest non-tender; regular rate and rhythm, S1, S2 without murmurs, rubs or gallops. Carotid upstroke normal, no bruit.  Pedals normal pulses. No edema, no varicosities. Abdomen:  Bowel sounds positive; abdomen soft and non-tender without masses, organomegaly, or hernias noted. No hepatosplenomegaly. Extremities:  No clubbing or cyanosis. Neurologic:  Alert and oriented x 3. Psych:  Normal affect.   Impression & Recommendations:  Problem # 1:  TACHYCARDIA (ICD-785.0) Patient has multifocal atrial tachycardia and wandering atrial pacemaker noted by holter and ECG.   Hemochromatosis with infiltration of the atria may be the cause of this arrhythmia.  No atrial fibrillation noted so far.  He had a significant burden of MAT, so I now have him on amiodarone and Toprol XL.  This has resolved his palpitations.  Unfortunately, he is having GI discomfort now, possibly due to amiodarone (versus from GERD).  I am going to cut his amiodarone in half to 100 mg daily and increase Toprol XL to 75 mg daily.  I will also increase his PPI.  We will call him in one week.  If the above measures do not help, I will stop amiodarone.  LFTs have actually decreased while on amiodarone.  Followup in 1 month with [facility].   Problem # 2:  CHRONIC SYSTOLIC HEART FAILURE (ICD-428.22) Patient was noted to have EF 30% with diffuse hypokinesis on recent echo.  He had a cath last year with only luminal irregularities in his coronaries.  He may have a cardiomyopathy due to hemochromatosis, which may improve over time with control of iron store by phlebotomy.  His last ferritin level was normal (  23).  Another possibility is tachycardia-mediated cardiomyopathy given frequent MAT.  This should improve with maintenance of NSR.  He is not short of breath and does not appear volume overloaded on exam.  Increase Toprol XL and continue lisinopril.    Patient Instructions: 1)  Your physician has recommended you make the following change in your medication:  2)  Decrease Amiodarone to 100mg  daily-this will be one-half of a 200mg  tablet  3)  Increase Toprol XL to 75mg  twice a day--this will be one and one-half of a 50mg  tablet twice a day 4)  Take Protonix 40mg  daily 5)  I will call you in a week to see how you are feeling--if you are not feeling better Dr Shirlee Latch will probably stop Amiodarone. 6)  Your physician recommends that you schedule a follow-up appointment in: 1 month---have lab done the same day you see Dr Shirlee Latch in 1 month--BMP/Liver profile  428.22 Prescriptions: TOPROL XL 50 MG XR24H-TAB (METOPROLOL  SUCCINATE) one and one-half  twice a day  #90 x 11   Entered by:   Katina Dung, RN, BSN   Authorized by:   Marca Ancona, MD   Signed by:   Katina Dung, RN, BSN on 03/01/2010   Method used:   Electronically to        [facility] (retail)       [address]       [place], [place]  [address]       Ph: [phone]       Fax: [phone]   RxID:   [id number] PROTONIX 40 MG TBEC (PANTOPRAZOLE SODIUM) one tablet daily  #30 x 11   Entered by:   Katina Dung, RN, BSN   Authorized by:   Marca Ancona, MD   Signed by:   Katina Dung, RN, BSN on 03/01/2010   Method used:   Electronically to        [facility] (retail)       [address]       [place], [place]  [address]       Ph: [phone]       Fax: [phone]   RxID:   [id number]

## 2010-10-15 NOTE — Miscellaneous (Signed)
Summary: Orders Update pft charges  Clinical Lists Changes  Orders: Added new Service order of Carbon Monoxide diffusing w/capacity (94720) - Signed Added new Service order of Lung Volumes (94240) - Signed Added new Service order of Spirometry (Pre & Post) (94060) - Signed 

## 2010-10-15 NOTE — Progress Notes (Signed)
Summary: need samples   Phone Note Call from Patient Call back at Home Phone [phone]   Caller: Patient Summary of Call: Pt need samples ot Metoprolol  Initial call taken by: Judie Grieve,  April 17, 2010 4:21 PM  Follow-up for Phone Call        Attempted to call x2 phone sounds bussy. Ollen Gross, RN, BSN  April 17, 2010 4:27 PM   Spoke with pt. regarding needing Metoprolol samples. Pt states he called to let Md know he has problems with his insurance to cover metoprolol medication. Pt. states  he did not asked for medication samples. I let pt. kow we do not have Metoprolol samples any way. Okay with pt.  Follow-up by: Ollen Gross, RN, BSN,  April 17, 2010 4:37 PM     Appended Document: need samples Would prefer that he continue Toprol XL.  If he cannot afford it, could switch to Coreg 12.5 mg two times a day.   Appended Document: need samples Prior Authorization  for insurance to cover Metoprolol medication was signed and faxed to insurance yesterday.Pt. aware.

## 2010-10-15 NOTE — Progress Notes (Signed)
Summary: Prescriptions  Phone Note Call from Patient   Caller: Patient Summary of Call: Keith [name] also requested a prescription for Cetirizine for allergies and fluicasone. He uses the [facility] in [place]. Initial call taken by: Scharlene Gloss,  November 08, 2009 9:31 AM  Follow-up for Phone Call        ok - to robin to handle Follow-up by: Corwin Levins MD,  November 08, 2009 1:10 PM    New/Updated Medications: ZYRTEC ALLERGY 10 MG TABS (CETIRIZINE HCL) 1 by mouth once daily FLONASE 50 MCG/ACT SUSP (FLUTICASONE PROPIONATE) 2 sprays each side once daily Prescriptions: FLONASE 50 MCG/ACT SUSP (FLUTICASONE PROPIONATE) 2 sprays each side once daily  #1 x 6   Entered by:   Scharlene Gloss   Authorized by:   Corwin Levins MD   Signed by:   Scharlene Gloss on 11/08/2009   Method used:   Faxed to ...       [facility] [address]* (retail)       [address]       [place], [place]  [address]       Ph: [phone]       Fax: [phone]   RxID:   [id number] ZYRTEC ALLERGY 10 MG TABS (CETIRIZINE HCL) 1 by mouth once daily  #30 x 6   Entered by:   Scharlene Gloss   Authorized by:   Corwin Levins MD   Signed by:   Scharlene Gloss on 11/08/2009   Method used:   Faxed to ...       [facility] [address] (retail)       [address]       [place], [place]  [address]       Ph: [phone]       Fax: [phone]   RxID:   [id number]

## 2010-10-15 NOTE — Assessment & Plan Note (Signed)
Summary: 2 month rov  Medications Added TOPROL XL 100 MG XR24H-TAB (METOPROLOL SUCCINATE) one twice a day      Allergies Added:   Primary Provider:  Oliver Barre, MD   History of Present Illness: 55 yo with h/o DM2, hyperlipidemia, left heart cath in 3/10 showing mild luminal irregularities, hereditary hemochromatosis getting phlebotomy treatment, and atrial tachycardia presents for followup.  Additionally, EF was noted to be depressed on recent echo (estimated at 30%).  A several months ago, he was having very bothersome tachypalpitations likely due to frequent atrial tachycardia.  I started him on Toprol XL and amiodarone.  This combination has resolved his palpitations and he feels like he has more energy.  Initially, he had GI discomfort on amiodarone but this has completely resolved after decreasing his dose to 100 mg daily.   He can climb a flight of steps and walk about a mile without dyspnea.  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  He is not taking a statin because of elevated LFTs, but he has been started on Welchol.  LFTs have been stable since starting amiodarone.  Occasional quite atypical chest pain.  No palpitations or racing heart rate.   Labs (3/10):  hepatitis panel negative, LDL 79, HDL 25, TG 192, AST 79, ALT 115 Labs (4/11): ferritin  Labs (5/11): BNP 21 Labs (6/11): TSH normal, AST 43, ALT 61 Labs (8/11): K 5.3, creatinine 1.2, TGs 233, HDL 69, LDL 134  ECG: NSR, nonspecific T wave changes.   Current Medications (verified): 1)  Omeprazole 20 Mg Cpdr (Omeprazole) .... 2 Po Once Daily 2)  Toprol Xl 50 Mg Xr24h-Tab (Metoprolol Succinate) .... One Tablet By Mouth  Twice A Day 3)  Ecotrin Low Strength 81 Mg  Tbec (Aspirin) .Marland Kitchen.. 1po Qd 4)  Tramadol Hcl 50 Mg Tabs (Tramadol Hcl) .Marland Kitchen.. 1 By Mouth Qid As Needed Pain 5)  Metformin Hcl 500 Mg Xr24h-Tab (Metformin Hcl) .... 2 Tablets Once Daily 6)  Lisinopril 10 Mg Tabs (Lisinopril) .... One  Daily 7)  Zyrtec Allergy 10 Mg Tabs (Cetirizine Hcl) .... As Needed 8)  Flonase 50 Mcg/act Susp (Fluticasone Propionate) .... 2 Sprays Each Side As Needed 9)  Protonix 40 Mg Tbec (Pantoprazole Sodium) .... One Tablet Daily 10)  Amiodarone Hcl 200 Mg Tabs (Amiodarone Hcl) .... Take One-Half  Tablet Once Daily 11)  Toprol Xl 25 Mg Xr24h-Tab (Metoprolol Succinate) .... Take One Tablet By Mouth Twice A Day. 12)  Welchol 3.75 Gm Pack (Colesevelam Hcl) .Marland Kitchen.. 1 Pk By Mouth Once Daily in Water  Allergies (verified): 1)  ! Pcn 2)  ! Zoloft 3)  ! Reglan  Past History:  Past Medical History: Reviewed history from 03/01/2010 and no changes required. 1. Hyperlipidemia. 2. Type 2 diabetes. 3. Gastroesophageal reflux disease. 4. Hypertension. 5. Chronic C-spine pain. 6. Colonic polyps. 7. Low back pain.  The patient is status post spinal fusion surgeries. 8. Hereditary hemochromatosis: Compound heterozygote.  Patient gets periodic phlebotomies 10. Anxiety. 11. Chest pain:  Exercise/adenosine Myoview showed EF of 50% (3/10).  There was inferior hypokinesis.  On perfusion images, there was inferior scar and ischemia in the apical anterior septum and in the inferior wall.  This is suggestive of a coronary disease.  LHC (3/10) showed only luminal irregularities in the coronaries with EF 55%, suggesting that myoview was a false positive, likely from diaphragmatic attenuation.   12. Multifocal atrial tachycardia/wandering atrial pacemaker.  Holter monitor in 5/11 showed frequent  runs of MAT with heart rate around 100.  Patient is now on amiodarone to try to suppress MAT.  PFTs (6/11): FVC 105%, FEV1 109%, TLC 112%, DLCO 87% (minimal obstructive defect).  LFTs stable on amiodarone.  13. Cardiomyopathy: echo (5/11) showed EF 30% with diffuse hypokinesis, normal wall thickness, mildly decreased RV systolic function.  This may be a cardiomyopathy due to hemochromatosis versus tachy-mediated.  Unable to get cardiac  MRI due to claustrophobia.   Family History: Reviewed history from 01/16/2009 and no changes required. mother with colon polyps, MS Father with HTN, ETOH sister with MS No FH of Colon Cancer: Family History of Diabetes: Mother, PGM, PGF  Social History: Reviewed history from 01/02/2010 and no changes required. disabled with back pain Married, lives in [place].  Former Smoker-stopped about 7 years ago Alcohol use-yes-3-4 drinks per month 3 children Drug Use - yes - quit in 1980 Daily Caffeine Use-3 drinks daily Patient does not get regular exercise.   Review of Systems       All systems reviewed and negative except as per HPI.   Vital Signs:  Patient profile:   55 year old male Height:      70.5 inches Weight:      222 pounds BMI:     31.52 Pulse rate:   79 / minute Resp:     16 per minute BP sitting:   112 / 74  (right arm)  Vitals Entered By: Marrion Coy, CNA (June 04, 2010 8:31 AM)  Physical Exam  General:  Well developed, well nourished, in no acute distress. Neck:  Neck supple, no JVD. No masses, thyromegaly or abnormal cervical nodes. Lungs:  Clear bilaterally to auscultation and percussion. Heart:  Non-displaced PMI, chest non-tender; regular rate and rhythm, S1, S2 without murmurs, rubs or gallops. Carotid upstroke normal, no bruit.  Pedals normal pulses. No edema, no varicosities. Abdomen:  Bowel sounds positive; abdomen soft and non-tender without masses, organomegaly, or hernias noted. No hepatosplenomegaly. Extremities:  No clubbing or cyanosis. Neurologic:  Alert and oriented x 3. Psych:  Normal affect.   Impression & Recommendations:  Problem # 1:  CHRONIC SYSTOLIC HEART FAILURE (ICD-428.22) Patient was noted to have EF 30% with diffuse hypokinesis on last echo.  He had a cath last year with only luminal irregularities in his coronaries.  He may have a cardiomyopathy due to hemochromatosis, which may improve over time with control of iron  stores by phlebotomy.  His last ferritin level was normal (23).  Another possibility is tachycardia-mediated cardiomyopathy given frequent MAT.  This should improve with maintenance of NSR.  He is not short of breath and does not appear volume overloaded on exam.  Increase Toprol XL to 100 mg two times a day.  Will keep lisinopril at 10 mg daily given borderline elevation in K.  Check BMET today.  Will repeat echo in 1/11 on medication to see if LV function is still depressed.  At that time, may need ICD.   Problem # 2:  TACHYCARDIA (ICD-785.0) Patient has had multifocal atrial tachycardia and wandering atrial pacemaker noted by holter and ECG.  Hemochromatosis with infiltration of the atria may be the cause of this arrhythmia.  No atrial fibrillation noted so far.  He had a significant burden of MAT, so I now have him on amiodarone and Toprol XL.  This has resolved his palpitations.  He had GI discomfort on amiodarone that resolved when dose was decreased to 100 mg daily.  He  will continue amiodarone.  LFTs and TSH will be checked today.  Will need yearly eye exam.   Other Orders: TLB-BMP (Basic Metabolic Panel-BMET) (80048-METABOL) TLB-Hepatic/Liver Function Pnl (80076-HEPATIC) TLB-Lipid Panel (80061-LIPID) TLB-TSH (Thyroid Stimulating Hormone) (84443-TSH) Echocardiogram (Echo)  Patient Instructions: 1)  Your physician has recommended you make the following change in your medication:  2)  Increase Toprol XL(metoprolol) 100mg  twice a day. 3)  Your physician recommends that you have  a FASTING lipid profile/liver profile/TSH/BMP  today---428.22 4)  Your physician recommends that you schedule a follow-up appointment in: 3 months with Dr Shirlee Latch. 5)  Your physician has requested that you have an echocardiogram.  Echocardiography is a painless test that uses sound waves to create images of your heart. It provides your doctor with information about the size and shape of your heart and how well your  heart's chambers and valves are working.  This procedure takes approximately one hour. There are no restrictions for this procedure. JANUARY 2012 Prescriptions: TOPROL XL 100 MG XR24H-TAB (METOPROLOL SUCCINATE) one twice a day  #180 x 3   Entered by:   Katina Dung, RN, BSN   Authorized by:   Marca Ancona, MD   Signed by:   Katina Dung, RN, BSN on 06/04/2010   Method used:   Electronically to        [facility] (retail)       [address]       [place], [place]  [address]       Ph: [phone]       Fax: [phone]   RxID:   [id number]

## 2010-10-15 NOTE — Progress Notes (Signed)
   Phone Note Call from Patient   Caller: Patient Details for Reason: Metoprolol medication coverage. Summary of Call: Pt called to clarify information. Initial call taken by: Ollen Gross, RN, BSN,  April 18, 2010 11:05 AM  Follow-up for Phone Call        Pt. called to let nurse know . He spoke with [facility]. The agent let  pt. know [employer] insuranse will pay for Metoprolol prescription if it is  orderfor a  50 mg twice a day and a 25 mg twice a day instead of a 50 mg tablet, 1 1/2 tablet twice a day. Prescription send to pharmacy. Pt. aware Follow-up by: Ollen Gross, RN, BSN,  April 18, 2010 11:35 AM

## 2010-10-15 NOTE — Procedures (Signed)
Summary: summary report   summary report   Imported By: Mirna Mires 01/29/2010 15:24:05  _____________________________________________________________________  External Attachment:    Type:   Image     Comment:   External Document

## 2010-10-15 NOTE — Progress Notes (Signed)
Summary: returning call   Phone Note Call from Patient   Caller: Patient [phone] Reason for Call: Talk to Nurse Summary of Call: pt returing call Initial call taken by: Glynda Jaeger,  June 20, 2010 2:37 PM  Follow-up for Phone Call        Pt. called. Pt. states was returning [employer]. I let pt. know Dr. Alford Highland nurse called pt to let him know that [facility] in [facility] called asking if pt. needed to take Omeprazole and Protonix. MD recommeds for pt. to take one or the other but not both medications. Pt. aware. He verbalized understanding. Follow-up by: Ollen Gross, RN, BSN,  June 20, 2010 2:56 PM

## 2010-10-16 ENCOUNTER — Encounter (AMBULATORY_SURGERY_CENTER): Payer: Medicare PPO | Admitting: Gastroenterology

## 2010-10-16 ENCOUNTER — Other Ambulatory Visit: Payer: Self-pay | Admitting: Gastroenterology

## 2010-10-16 ENCOUNTER — Encounter: Payer: Self-pay | Admitting: Gastroenterology

## 2010-10-16 DIAGNOSIS — D1330 Benign neoplasm of unspecified part of small intestine: Secondary | ICD-10-CM

## 2010-10-16 DIAGNOSIS — K5730 Diverticulosis of large intestine without perforation or abscess without bleeding: Secondary | ICD-10-CM

## 2010-10-16 DIAGNOSIS — Z1211 Encounter for screening for malignant neoplasm of colon: Secondary | ICD-10-CM

## 2010-10-16 DIAGNOSIS — D126 Benign neoplasm of colon, unspecified: Secondary | ICD-10-CM

## 2010-10-16 DIAGNOSIS — K621 Rectal polyp: Secondary | ICD-10-CM

## 2010-10-16 DIAGNOSIS — K2980 Duodenitis without bleeding: Secondary | ICD-10-CM

## 2010-10-16 DIAGNOSIS — K449 Diaphragmatic hernia without obstruction or gangrene: Secondary | ICD-10-CM

## 2010-10-16 DIAGNOSIS — K219 Gastro-esophageal reflux disease without esophagitis: Secondary | ICD-10-CM

## 2010-10-16 LAB — HM COLONOSCOPY

## 2010-10-17 LAB — HELICOBACTER PYLORI SCREEN-BIOPSY: UREASE: NEGATIVE

## 2010-10-17 NOTE — Letter (Signed)
Summary: Diabetic Instructions  [facility]  [address] [place], [place] [address]   Phone: [phone]  Fax: [phone]    Keith [name] Jul 25, 1956 MRN: [id number]   X   ORAL DIABETIC MEDICATION INSTRUCTIONS  The day before your procedure:   Take your diabetic pill as you do normally  The day of your procedure:   Do not take your diabetic pill    We will check your blood sugar levels during the admission process and again in Recovery before discharging you home

## 2010-10-17 NOTE — Assessment & Plan Note (Signed)
Summary: PER CHECK OUT/SF  Medications Added LISINOPRIL 10 MG TABS (LISINOPRIL) one tablet twice a day      Allergies Added:   Visit Type:  Follow-up Primary Provider:  Oliver Barre, MD  CC:  no complaints.  History of Present Illness: 55 yo with h/o DM2, hyperlipidemia, left heart cath in 3/10 showing mild luminal irregularities, hereditary hemochromatosis getting phlebotomy treatment, and atrial tachycardia presents for followup.  Additionally, EF was noted to be depressed on recent echo (estimated at 30%).  Earlier this year, he was having very bothersome tachypalpitations likely due to frequent atrial tachycardia.  I started him on Toprol XL and amiodarone.    He has had no recent tachypalpitations.  He is feeling well in general with no exertional dyspnea.  NO chest pain.  LFTs and TSH normal on amiodarone in 9/11.   Labs (3/10):  hepatitis panel negative, LDL 79, HDL 25, TG 192, AST 79, ALT 115 Labs (4/11): ferritin  Labs (5/11): BNP 21 Labs (6/11): TSH normal, AST 43, ALT 61 Labs (8/11): K 5.3, creatinine 1.2, TGs 233, HDL 69, LDL 134 Labs (9/11): K 4.5, creatinine 1.2, LFTs normal, TSH normal, LDL 104, HDL 30  ECG: NSR, normal  Current Medications (verified): 1)  Omeprazole 20 Mg Cpdr (Omeprazole) .... 2 Po Once Daily 2)  Toprol Xl 100 Mg Xr24h-Tab (Metoprolol Succinate) .... One Twice A Day 3)  Ecotrin Low Strength 81 Mg  Tbec (Aspirin) .Marland Kitchen.. 1po Qd 4)  Tramadol Hcl 50 Mg Tabs (Tramadol Hcl) .Marland Kitchen.. 1 By Mouth Qid As Needed Pain 5)  Metformin Hcl 500 Mg Xr24h-Tab (Metformin Hcl) .... 2 Tablets Once Daily 6)  Lisinopril 10 Mg Tabs (Lisinopril) .... One Daily 7)  Zyrtec Allergy 10 Mg Tabs (Cetirizine Hcl) .... As Needed 8)  Flonase 50 Mcg/act Susp (Fluticasone Propionate) .... 2 Sprays Each Side As Needed 9)  Protonix 40 Mg Tbec (Pantoprazole Sodium) .... One Tablet Daily 10)  Amiodarone Hcl 200 Mg Tabs (Amiodarone Hcl) .... Take One-Half  Tablet Once Daily 11)  Welchol 3.75  Gm Pack (Colesevelam Hcl) .Marland Kitchen.. 1 Pk By Mouth Once Daily in Water  Allergies (verified): 1)  ! Pcn 2)  ! Zoloft 3)  ! Reglan  Past History:  Past Medical History: 1. Hyperlipidemia. 2. Type 2 diabetes. 3. Gastroesophageal reflux disease. 4. Hypertension. 5. Chronic C-spine pain. 6. Colonic polyps. 7. Low back pain.  The patient is status post spinal fusion surgeries. 8. Hereditary hemochromatosis: Compound heterozygote.  Patient gets periodic phlebotomies 10. Anxiety. 11. Chest pain:  Exercise/adenosine Myoview showed EF of 50% (3/10).  There was inferior hypokinesis.  On perfusion images, there was inferior scar and ischemia in the apical anterior septum and in the inferior wall.  This is suggestive of a coronary disease.  LHC (3/10) showed only luminal irregularities in the coronaries with EF 55%, suggesting that myoview was a false positive, likely from diaphragmatic attenuation.   12. Multifocal atrial tachycardia/wandering atrial pacemaker.  Holter monitor in 5/11 showed frequent runs of symptomatic MAT with heart rate around 100.  Patient is now on amiodarone to try to suppress MAT.  PFTs (6/11): FVC 105%, FEV1 109%, TLC 112%, DLCO 87% (minimal obstructive defect).  LFTs stable on amiodarone.  13. Cardiomyopathy: echo (5/11) showed EF 30% with diffuse hypokinesis, normal wall thickness, mildly decreased RV systolic function.  This may be a cardiomyopathy due to hemochromatosis versus tachy-mediated.  Unable to get cardiac MRI due to claustrophobia.   Family History: Reviewed history from 01/16/2009  and no changes required. mother with colon polyps, MS Father with HTN, ETOH sister with MS No FH of Colon Cancer: Family History of Diabetes: Mother, PGM, PGF  Social History: Reviewed history from 01/02/2010 and no changes required. disabled with back pain Married, lives in [place].  Former Smoker-stopped about 7 years ago Alcohol use-yes-3-4 drinks per month 3 children Drug  Use - yes - quit in 1980 Daily Caffeine Use-3 drinks daily Patient does not get regular exercise.   Review of Systems       All systems reviewed and negative except as per HPI.   Vital Signs:  Patient profile:   55 year old male Height:      70.5 inches Weight:      226.25 pounds BMI:     32.12 Pulse rate:   65 / minute BP sitting:   120 / 92  (left arm) Cuff size:   regular  Vitals Entered By: Caralee Ates CMA (September 04, 2010 9:28 AM)  Physical Exam  General:  Well developed, well nourished, in no acute distress. Neck:  Neck supple, no JVD. No masses, thyromegaly or abnormal cervical nodes. Lungs:  Clear bilaterally to auscultation and percussion. Heart:  Non-displaced PMI, chest non-tender; regular rate and rhythm, S1, S2 without murmurs, rubs or gallops. Carotid upstroke normal, no bruit.  Pedals normal pulses. No edema, no varicosities. Abdomen:  Bowel sounds positive; abdomen soft and non-tender without masses, organomegaly, or hernias noted. No hepatosplenomegaly. Extremities:  No clubbing or cyanosis. Neurologic:  Alert and oriented x 3. Psych:  Normal affect.   Impression & Recommendations:  Problem # 1:  TACHYCARDIA (ICD-785.0) Patient has had multifocal atrial tachycardia and wandering atrial pacemaker noted by holter and ECG.  Hemochromatosis with infiltration of the atria may be the cause of this arrhythmia.  No atrial fibrillation noted so far.  He had a significant burden of MAT, so I now have him on amiodarone and Toprol XL.  This has resolved his palpitations.  He had GI discomfort on amiodarone that resolved when dose was decreased to 100 mg daily.  He will continue amiodarone.  LFTs and TSH were normal recently.  Will need yearly eye exam.   Problem # 2:  CHRONIC SYSTOLIC HEART FAILURE (ICD-428.22) Assessment: Improved Patient was noted to have EF 30% with diffuse hypokinesis on last echo.  He had a cath last year with only luminal irregularities in his  coronaries.  He may have a cardiomyopathy due to hemochromatosis, which may improve over time with control of iron stores by phlebotomy. His ferritin level has been controlled.  Another possibility is tachycardia-mediated cardiomyopathy given frequent MAT.  This should improve with maintenance of NSR.  He is not short of breath and does not appear volume overloaded on exam.  I will increase lisinopril to 10 mg two times a day and get BMET in 2 weeks to check K.  I will get an echo in May to see if EF has corrected.  If not, he will need an ICD.   Other Orders: Echocardiogram (Echo)  Patient Instructions: 1)  Your physician has recommended you make the following change in your medication:  2)  Increase Lisinopril to 10mg  twice  a day. 3)  Your physician recommends that you return for lab work in: 2 weeks-BMP 428.22 785.0 4)  Your physician has requested that you have an echocardiogram.  Echocardiography is a painless test that uses sound waves to create images of your heart. It provides your  doctor with information about the size and shape of your heart and how well your heart's chambers and valves are working.  This procedure takes approximately one hour. There are no restrictions for this procedure. MAY 2012 5)  Your physician wants you to follow-up in: 6 months with Dr Shirlee Latch. Joycie Peek 2012)  You will receive a reminder letter in the mail two months in advance. If you don't receive a letter, please call our office to schedule the follow-up appointment. Prescriptions: LISINOPRIL 10 MG TABS (LISINOPRIL) one tablet twice a day  #60 x 6   Entered by:   Katina Dung, RN, BSN   Authorized by:   Marca Ancona, MD   Signed by:   Katina Dung, RN, BSN on 09/04/2010   Method used:   Electronically to        [facility] (retail)       [address]       [place], [place]  [address]       Ph: [phone]       Fax: [phone]   RxID:   [id number]

## 2010-10-17 NOTE — Assessment & Plan Note (Addendum)
Summary: hemachromatosis follow up/lk    History of Present Illness Primary GI MD: Sheryn Bison MD FACP FAGA Primary Provider: Oliver Barre, MD Requesting Provider: n/a Chief Complaint: f/u hemachromatosis, pt denies any GI sx. History of Present Illness:   55 year old Caucasian who is a heterozygote for hemochromatosis. However in the past  he has had elevated serum ferritin levels, and has required previous phlebotomies, but he has not required these in over the last year with a normal serum ferritin level under 50. He has a mild cardiomyopathy, but otherwise has had no symptoms of iron deposition in other organs. He specifically denies right upper quadrant pain, pruritus, mental status changes, arthritis, peripheral edema, ascites, etc.  He does have chronic acid reflux and takes Protonix 40 mg a day. He continues to complain of gas, bloating, and atypical GERD but no dysphagia.His appetite is good and his weight is stable. Apparently he  has daily bowel movements without melena or hematochezia. There is a past history of colonic polyposis, and colonoscopy is due at this time. He is followed closely by cardiology and Dr. Oliver Barre.He denies ethanol use at all and quit smoking several years ago. He has not had previous abdominal surgery.   GI Review of Systems      Denies abdominal pain, acid reflux, belching, bloating, chest pain, dysphagia with liquids, dysphagia with solids, heartburn, loss of appetite, nausea, vomiting, vomiting blood, weight loss, and  weight gain.        Denies anal fissure, black tarry stools, change in bowel habit, constipation, diarrhea, diverticulosis, fecal incontinence, heme positive stool, hemorrhoids, irritable bowel syndrome, jaundice, light color stool, liver problems, rectal bleeding, and  rectal pain.    Current Medications (verified): 1)  Toprol Xl 100 Mg Xr24h-Tab (Metoprolol Succinate) .... One Twice A Day 2)  Ecotrin Low Strength 81 Mg  Tbec  (Aspirin) .Marland Kitchen.. 1po Qd 3)  Metformin Hcl 500 Mg Xr24h-Tab (Metformin Hcl) .... 2 Tablets Once Daily 4)  Lisinopril 10 Mg Tabs (Lisinopril) .... One Tablet Twice A Day 5)  Zyrtec Allergy 10 Mg Tabs (Cetirizine Hcl) .... As Needed 6)  Flonase 50 Mcg/act Susp (Fluticasone Propionate) .... 2 Sprays Each Side As Needed 7)  Protonix 40 Mg Tbec (Pantoprazole Sodium) .... One Tablet By Mouth Two Times A Day 8)  Amiodarone Hcl 200 Mg Tabs (Amiodarone Hcl) .... 1/2  Tablet Once Daily 9)  Welchol 3.75 Gm Pack (Colesevelam Hcl) .Marland Kitchen.. 1 Pk By Mouth Once Daily in Water  Allergies (verified): 1)  ! Pcn 2)  ! Zoloft 3)  ! Reglan  Past History:  Past medical, surgical, family and social histories (including risk factors) reviewed for relevance to current acute and chronic problems.  Past Medical History: Reviewed history from 09/04/2010 and no changes required. 1. Hyperlipidemia. 2. Type 2 diabetes. 3. Gastroesophageal reflux disease. 4. Hypertension. 5. Chronic C-spine pain. 6. Colonic polyps. 7. Low back pain.  The patient is status post spinal fusion surgeries. 8. Hereditary hemochromatosis: Compound heterozygote.  Patient gets periodic phlebotomies 10. Anxiety. 11. Chest pain:  Exercise/adenosine Myoview showed EF of 50% (3/10).  There was inferior hypokinesis.  On perfusion images, there was inferior scar and ischemia in the apical anterior septum and in the inferior wall.  This is suggestive of a coronary disease.  LHC (3/10) showed only luminal irregularities in the coronaries with EF 55%, suggesting that myoview was a false positive, likely from diaphragmatic attenuation.   12. Multifocal atrial tachycardia/wandering atrial pacemaker.  Holter monitor in  5/11 showed frequent runs of symptomatic MAT with heart rate around 100.  Patient is now on amiodarone to try to suppress MAT.  PFTs (6/11): FVC 105%, FEV1 109%, TLC 112%, DLCO 87% (minimal obstructive defect).  LFTs stable on amiodarone.  13.  Cardiomyopathy: echo (5/11) showed EF 30% with diffuse hypokinesis, normal wall thickness, mildly decreased RV systolic function.  This may be a cardiomyopathy due to hemochromatosis versus tachy-mediated.  Unable to get cardiac MRI due to claustrophobia.   Past Surgical History: Reviewed history from 01/16/2009 and no changes required. Inguinal herniorrhaphy s/p back surgery 1991 - lumbar Tonsillectomy  Family History: Reviewed history from 01/16/2009 and no changes required. mother with colon polyps, MS Father with HTN, ETOH sister with MS No FH of Colon Cancer: Family History of Diabetes: Mother, PGM, PGF  Social History: Reviewed history from 01/02/2010 and no changes required. disabled with back pain Married, lives in [place].  Former Smoker-stopped about 7 years ago Alcohol use-yes-3-4 drinks per month 3 children Drug Use - yes - quit in 1980 Daily Caffeine Use-3 drinks daily Patient does not get regular exercise.   Review of Systems  The patient denies allergy/sinus, anemia, anxiety-new, arthritis/joint pain, back pain, blood in urine, breast changes/lumps, change in vision, confusion, cough, coughing up blood, depression-new, fainting, fatigue, fever, headaches-new, hearing problems, heart murmur, heart rhythm changes, itching, menstrual pain, muscle pains/cramps, night sweats, nosebleeds, pregnancy symptoms, shortness of breath, skin rash, sleeping problems, sore throat, swelling of feet/legs, swollen lymph glands, thirst - excessive , urination - excessive , urination changes/pain, urine leakage, vision changes, and voice change.         occasional arthritic type complaints in his hands bilaterally  Vital Signs:  Patient profile:   55 year old male Height:      70.5 inches Weight:      226 pounds BMI:     32.08 Pulse rate:   70 / minute Pulse rhythm:   regular BP sitting:   112 / 70  (left arm) Cuff size:   regular  Vitals Entered By: Christie Nottingham CMA  Duncan Dull) (October 11, 2010 8:59 AM)  Physical Exam  General:  Well developed, well nourished, no acute distress.healthy appearing.   Head:  Normocephalic and atraumatic. Eyes:  exam deferred to patient's ophthalmologist.   Lungs:  Clear throughout to auscultation. Heart:  Regular rate and rhythm; no murmurs, rubs,  or bruits. Abdomen:  Soft, nontender and nondistended. No masses, hepatosplenomegaly or hernias noted. Normal bowel sounds. Neurologic:  Alert and  oriented x4;  grossly normal neurologically. Psych:  Alert and cooperative. Normal mood and affect.   Impression & Recommendations:  Problem # 1:  FLATULENCE ERUCTATION AND GAS PAIN (ICD-787.3) Assessment Deteriorated Trial of probiotic therapy with followup endoscopy and small bowel biopsy and also exam for H pylori infection and chronic gastritis. TLB-Folic Acid (Folate) (82746-FOL) TLB-B12, Serum-Total ONLY (03474-Q59) TLB-IgA (Immunoglobulin A) (82784-IGA) T-Sprue Panel (Celiac Disease Aby Eval) (83516x3/[address]) Colon/Endo (Colon/Endo)  Problem # 2:  CHRONIC SYSTOLIC HEART FAILURE (ICD-428.22) Assessment: Improved continue all cardiac medications as listed and reviewed in his record.  Problem # 3:  HEMOCHROMATOSIS (ICD-275.0) Assessment: Improved He is actually a heterozygote for hemachromatosis. His liver function tests have been normal and he denies intake of ethanol or any symptoms of chronic liver disease. Exam did not show hepatosplenomegaly, ascites, or other stigmata of chronic liver disease. Orders: TLB-B12, Serum-Total ONLY [phone]) TLB-IgA (Immunoglobulin A) (82784-IGA) T-Sprue Panel (Celiac Disease Aby Eval) (83516x3/[address]) Colon/Endo (Colon/Endo)  Problem # 4:  TRANSAMINASES, SERUM, ELEVATED (ICD-790.4) Assessment: Improved I would recommend every 6 months liver profile and ferritin level. He does not need phlebotomy unless his ferritin is over 50. His been no evidence of significant anemia, but  I will check his B12 and folate level.  Patient Instructions: 1)  Copy sent to : Oliver Barre, MD 2)  Please go to the basement today for your labs.  3)  Your prescription(s) have been sent to you pharmacy.  4)  Your procedure has been scheduled for 10/16/2010, please follow the seperate instructions.  5)  [facility] Patient Information Guide given to patient.  6)  Colonoscopy and Flexible Sigmoidoscopy brochure given.  7)  Upper Endoscopy brochure given.  8)  The medication list was reviewed and reconciled.  All changed / newly prescribed medications were explained.  A complete medication list was provided to the patient / caregiver. Prescriptions: MOVIPREP 100 GM  SOLR (PEG-KCL-NACL-NASULF-NA ASC-C) As per prep instructions.  #1 x 0   Entered by:   Harlow Mares CMA (AAMA)   Authorized by:   Mardella Layman MD [facility]   Signed by:   Mardella Layman MD [facility] on 10/11/2010   Method used:   Electronically to        [facility] (retail)       [address]       [place], [place]  [address]       Ph: [phone]       Fax: [phone]   RxID:   [id number]

## 2010-10-17 NOTE — Letter (Signed)
Summary: [facility] Instructions  [facility]  [address] [place], [place] [address]   Phone: [phone]  Fax: [phone]       Keith [name]    1956/06/27    MRN: [id number]        Procedure Day Dorna Bloom: Wednesday 10/16/2010     Arrival Time: 8:30am     Procedure Time: 9:30am     Location of Procedure:                     X  [facility] (4th Floor)   PREPARATION FOR COLONOSCOPY WITH MOVIPREP   Starting 5 days prior to your procedure Today do not eat nuts, seeds, popcorn, corn, beans, peas,  salads, or any raw vegetables.  Do not take any fiber supplements (e.g. Metamucil, Citrucel, and Benefiber).  THE DAY BEFORE YOUR PROCEDURE         Tuesday 10/14/2010  1.  Drink clear liquids the entire day-NO SOLID FOOD  2.  Do not drink anything colored red or purple.  Avoid juices with pulp.  No orange juice.  3.  Drink at least 64 oz. (8 glasses) of fluid/clear liquids during the day to prevent dehydration and help the prep work efficiently.  CLEAR LIQUIDS INCLUDE: Water Jello Ice Popsicles Tea (sugar ok, no milk/cream) Powdered fruit flavored drinks Coffee (sugar ok, no milk/cream) Gatorade Juice: apple, white grape, white cranberry  Lemonade Clear bullion, consomm, broth Carbonated beverages (any kind) Strained chicken noodle soup Hard Candy                             4.  In the morning, mix first dose of MoviPrep solution:    Empty 1 Pouch A and 1 Pouch B into the disposable container    Add lukewarm drinking water to the top line of the container. Mix to dissolve    Refrigerate (mixed solution should be used within 24 hrs)  5.  Begin drinking the prep at 5:00 p.m. The MoviPrep container is divided by 4 marks.   Every 15 minutes drink the solution down to the next mark (approximately 8 oz) until the full liter is complete.   6.  Follow completed prep with 16 oz of clear liquid of your choice (Nothing red or purple).  Continue to  drink clear liquids until bedtime.  7.  Before going to bed, mix second dose of MoviPrep solution:    Empty 1 Pouch A and 1 Pouch B into the disposable container    Add lukewarm drinking water to the top line of the container. Mix to dissolve    Refrigerate  THE DAY OF YOUR PROCEDURE      Wednesday 10/16/2010  Beginning at 4:30am (5 hours before procedure):         1. Every 15 minutes, drink the solution down to the next mark (approx 8 oz) until the full liter is complete.  2. Follow completed prep with 16 oz. of clear liquid of your choice.    3. You may drink clear liquids until 7:30am (2 HOURS BEFORE PROCEDURE).   MEDICATION INSTRUCTIONS  Unless otherwise instructed, you should take regular prescription medications with a small sip of water   as early as possible the morning of your procedure.  Diabetic patients - see separate instructions.        OTHER INSTRUCTIONS  You will need a responsible adult at least 54 years of  age to accompany you and drive you home.   This person must remain in the waiting room during your procedure.  Wear loose fitting clothing that is easily removed.  Leave jewelry and other valuables at home.  However, you may wish to bring a book to read or  an iPod/MP3 player to listen to music as you wait for your procedure to start.  Remove all body piercing jewelry and leave at home.  Total time from sign-in until discharge is approximately 2-3 hours.  You should go home directly after your procedure and rest.  You can resume normal activities the  day after your procedure.  The day of your procedure you should not:   Drive   Make legal decisions   Operate machinery   Drink alcohol   Return to work  You will receive specific instructions about eating, activities and medications before you leave.    The above instructions have been reviewed and explained to me by   _______________________    I fully understand and can verbalize  these instructions _____________________________ Date _________

## 2010-10-23 NOTE — Procedures (Addendum)
Summary: Colonoscopy  Patient: Keith [name] Note: All result statuses are Final unless otherwise noted.  Tests: (1) Colonoscopy (COL)   COL Colonoscopy           DONE     [facility]     [address]     [place], [place]  [address]           COLONOSCOPY PROCEDURE REPORT           PATIENT:  Keith [name], Keith [name]  MR#:  [id number]     BIRTHDATE:  09/04/1956, 54 yrs. old  GENDER:  male     ENDOSCOPIST:  Vania Rea. Jarold Motto, MD, [facility]     REF. BY:  Oliver Barre, M.D.     PROCEDURE DATE:  10/16/2010     PROCEDURE:  Colonoscopy with biopsy     ASA CLASS:  Class II     INDICATIONS:  Elevated Risk Screening     MEDICATIONS:   Fentanyl 75 mcg IV, Versed 8 mg IV           DESCRIPTION OF PROCEDURE:   After the risks benefits and     alternatives of the procedure were thoroughly explained, informed     consent was obtained.  Digital rectal exam was performed and     revealed no abnormalities.   The LB 180AL [id number] endoscope was     introduced through the anus and advanced to the cecum, which was     identified by both the appendix and ileocecal valve, without     limitations.  The quality of the prep was excellent, using     MoviPrep.  The instrument was then slowly withdrawn as the colon     was fully examined.     <<PROCEDUREIMAGES>>           FINDINGS:  Mild diverticulosis was found in the sigmoid colon.     There were multiple polyps identified and removed. in the rectum.     SMALL 1-2 MM NODULES COLD BIOPSIED.  This was otherwise a normal     examination of the colon.   Retroflexed views in the rectum     revealed no abnormalities.    The scope was then withdrawn from     the patient and the procedure completed.           COMPLICATIONS:  None     ENDOSCOPIC IMPRESSION:     1) Mild diverticulosis in the sigmoid colon     2) Polyps, multiple in the rectum     3) Otherwise normal examination     R/O ADENOMAS.     RECOMMENDATIONS:     1) Repeat colonoscopy in 5 years if  polyp adenomatous; otherwise     10 years     2) High fiber diet.     REPEAT EXAM:  No           ______________________________     Vania Rea. Jarold Motto, MD, [facility]           CC:           n.     eSIGNED:   Vania Rea. Patterson at 10/16/2010 10:04 AM           Rod, Peyton Najjar, [id number]  Note: An exclamation mark (!) indicates a result that was not dispersed into the flowsheet. Document Creation Date: 10/16/2010 10:05 AM _______________________________________________________________________  (1) Order result status: Final Collection or observation date-time: 10/16/2010 10:00 Requested date-time:  Receipt date-time:  Reported date-time:  Referring Physician:   Ordering Physician: Sheryn Bison [phone]) Specimen Source:  Source: Launa Grill Order Number: [id number] Lab site:   Appended Document: Colonoscopy     Procedures Next Due Date:    Colonoscopy: 10/2020  Appended Document: Colonoscopy 10y f/u

## 2010-10-23 NOTE — Miscellaneous (Signed)
Summary: Orders Update clo  Clinical Lists Changes  Orders: Added new Test order of TLB-H Pylori Screen Gastric Biopsy (83013-CLOTEST) - Signed 

## 2010-10-23 NOTE — Procedures (Addendum)
Summary: Upper Endoscopy  Patient: [name] Note: All result statuses are Final unless otherwise noted.  Tests: (1) Upper Endoscopy (EGD)   EGD Upper Endoscopy       DONE     [facility]     [address]     [place], [place]  [address]           ENDOSCOPY PROCEDURE REPORT           PATIENT:  Keith [name], Keith [name]  MR#:  [id number]     BIRTHDATE:  December 24, 1955, 54 yrs. old  GENDER:  male           ENDOSCOPIST:  Vania Rea. Jarold Motto, MD, [facility]     Referred by:  Oliver Barre, M.D.           PROCEDURE DATE:  10/16/2010     PROCEDURE:  EGD with biopsy, [address]     ASA CLASS:  Class II     INDICATIONS:  GERD ELEVATED CELIAC ANTIBODIES.           MEDICATIONS:   There was residual sedation effect present from     prior procedure., Fentanyl 25 mcg IV, Versed 2 mg IV     TOPICAL ANESTHETIC:           DESCRIPTION OF PROCEDURE:   After the risks benefits and     alternatives of the procedure were thoroughly explained, informed     consent was obtained.  The LB [id number] [id number] endoscope was     introduced through the mouth and advanced to the second portion of     the duodenum, limited by retching and gagging.   The instrument     was slowly withdrawn as the mucosa was fully examined.     <<PROCEDUREIMAGES>>           ULTRASONIC FINDINGS:   A hiatal hernia was found.  CM, HH NOTED.     Duodenitis was found. ANTRAL CLO BX. DONE.  The esophagus and     gastroesophageal junction were completely normal in appearance.     The stomach was entered and closely examined. The antrum,     angularis, and lesser curvature were well visualized, including a     retroflexed view of the cardia and fundus. The stomach wall was     normally distensable. The scope passed easily through the pylorus     into the duodenum. LARGE RUGAL FOLDS NOTED.    Retroflexed views     revealed no abnormalities.    The scope was then withdrawn from     the patient and the procedure completed.        COMPLICATIONS:  None           ENDOSCOPIC IMPRESSION:     1) Duodenitis     2) Hiatal hernia     3) Normal esophagus     4) Normal stomach     1. TREATED GERD     2.R/O H.PYLORI     3.R/O CELIAC DISEASE     RECOMMENDATIONS:     1) Await biopsy results     2) Rx CLO if positive     3) continue PPI           REPEAT EXAM:  No           ______________________________     Vania Rea. Jarold Motto, MD, [facility]           CC:  n.     eSIGNED:   Vania Rea. Patterson at 10/16/2010 10:18 AM           Rucci, [place], [id number]  Note: An exclamation mark (!) indicates a result that was not dispersed into the flowsheet. Document Creation Date: 10/16/2010 10:19 AM _______________________________________________________________________  (1) Order result status: Final Collection or observation date-time: 10/16/2010 10:11 Requested date-time:  Receipt date-time:  Reported date-time:  Referring Physician:   Ordering Physician: Sheryn Bison [phone]) Specimen Source:  Source: Launa Grill Order Number: [id number] Lab site:

## 2010-10-28 ENCOUNTER — Encounter: Payer: Self-pay | Admitting: Gastroenterology

## 2010-11-05 ENCOUNTER — Other Ambulatory Visit: Payer: Self-pay

## 2010-11-06 NOTE — Letter (Signed)
Summary: Patient Notice-Endo Biopsy Results  [facility]  [address] [place], [place] [address]   Phone: [phone]  Fax: [phone]        October 28, 2010 MRN: [id number]    [facility] [address] [place], [place]  [address]    Dear Keith [name],  I am pleased to inform you that the biopsies taken during your recent endoscopic examination did not show any evidence of cancer upon pathologic examination.  Additional information/recommendations:  __No further action is needed at this time.  Please follow-up with      your primary care physician for your other healthcare needs.  __ Please call [phone] to schedule a return visit to review      your condition.  x__ Continue with the treatment plan as outlined on the day of your      exam.BIOPSIES FOR CELIAC DISEASE AND H.PYLORI INFECTION WERE NEGATIVE.  __ You should have a repeat endoscopic examination for this problem              in _ months/years.   Please call us if you are having persistent problems or have questions about your condition that have not been fully answered at this time.  Sincerely,  Keith Layman MD [facility]  This letter has been electronically signed by your physician.  Appended Document: Patient Notice-Endo Biopsy Results Letter mailed

## 2010-11-06 NOTE — Letter (Signed)
Summary: Patient Notice- Polyp Results  [facility]  [address] [place], [place] [address]   Phone: [phone]  Fax: [phone]        October 28, 2010 MRN: [id number]    [facility] [address] [place], [place]  [address]    Dear Mr. WELLEN,  I am pleased to inform you that the colon polyp(s) removed during your recent colonoscopy was (were) found to be benign (no cancer detected) upon pathologic examination.  I recommend you have a repeat colonoscopy examination in 10_ years to look for recurrent polyps, as having colon polyps increases your risk for having recurrent polyps or even colon cancer in the future.  Should you develop new or worsening symptoms of abdominal pain, bowel habit changes or bleeding from the rectum or bowels, please schedule an evaluation with either your primary care physician or with me.  Additional information/recommendations:  __ No further action with gastroenterology is needed at this time. Please      follow-up with your primary care physician for your other healthcare      needs.  __ Please call [phone] to schedule a return visit to review your      situation.  _x_ Please keep your follow-up visit as already scheduled.  __ Continue treatment plan as outlined the day of your exam.  Please call us if you are having persistent problems or have questions about your condition that have not been fully answered at this time.  Sincerely,  Mardella Layman MD [facility]  This letter has been electronically signed by your physician.  Appended Document: Patient Notice- Polyp Results Letter Mailed

## 2010-11-12 ENCOUNTER — Other Ambulatory Visit: Payer: Medicare PPO

## 2010-11-12 ENCOUNTER — Other Ambulatory Visit: Payer: Self-pay | Admitting: Internal Medicine

## 2010-11-12 ENCOUNTER — Encounter: Payer: Self-pay | Admitting: Internal Medicine

## 2010-11-12 ENCOUNTER — Ambulatory Visit (INDEPENDENT_AMBULATORY_CARE_PROVIDER_SITE_OTHER): Payer: Medicare PPO | Admitting: Internal Medicine

## 2010-11-12 DIAGNOSIS — Z0000 Encounter for general adult medical examination without abnormal findings: Secondary | ICD-10-CM

## 2010-11-12 DIAGNOSIS — M6530 Trigger finger, unspecified finger: Secondary | ICD-10-CM | POA: Insufficient documentation

## 2010-11-12 DIAGNOSIS — E119 Type 2 diabetes mellitus without complications: Secondary | ICD-10-CM

## 2010-11-12 DIAGNOSIS — E785 Hyperlipidemia, unspecified: Secondary | ICD-10-CM

## 2010-11-12 DIAGNOSIS — Z125 Encounter for screening for malignant neoplasm of prostate: Secondary | ICD-10-CM

## 2010-11-12 LAB — BASIC METABOLIC PANEL WITH GFR
BUN: 18 mg/dL (ref 6–23)
CO2: 27 meq/L (ref 19–32)
Calcium: 9.4 mg/dL (ref 8.4–10.5)
Chloride: 101 meq/L (ref 96–112)
Creatinine, Ser: 1.2 mg/dL (ref 0.4–1.5)
GFR: 65.89 mL/min
Glucose, Bld: 107 mg/dL — ABNORMAL HIGH (ref 70–99)
Potassium: 4.6 meq/L (ref 3.5–5.1)
Sodium: 137 meq/L (ref 135–145)

## 2010-11-12 LAB — URINALYSIS
Bilirubin Urine: NEGATIVE
Hgb urine dipstick: NEGATIVE
Total Protein, Urine: NEGATIVE
Urine Glucose: NEGATIVE
pH: 5.5 (ref 5.0–8.0)

## 2010-11-12 LAB — HEPATIC FUNCTION PANEL
Albumin: 4.1 g/dL (ref 3.5–5.2)
Alkaline Phosphatase: 61 U/L (ref 39–117)
Bilirubin, Direct: 0.1 mg/dL (ref 0.0–0.3)

## 2010-11-12 LAB — HEMOGLOBIN A1C: Hgb A1c MFr Bld: 6.6 % — ABNORMAL HIGH (ref 4.6–6.5)

## 2010-11-12 LAB — LDL CHOLESTEROL, DIRECT: Direct LDL: 99.3 mg/dL

## 2010-11-12 LAB — MICROALBUMIN / CREATININE URINE RATIO
Creatinine,U: 168.4 mg/dL
Microalb Creat Ratio: 1.1 mg/g (ref 0.0–30.0)

## 2010-11-12 LAB — LIPID PANEL
HDL: 25.7 mg/dL — ABNORMAL LOW (ref >39.00)
VLDL: 59 mg/dL — ABNORMAL HIGH (ref 0.0–40.0)

## 2010-11-12 LAB — CBC WITH DIFFERENTIAL/PLATELET
Basophils Absolute: 0 10*3/uL (ref 0.0–0.1)
Eosinophils Absolute: 0.2 10*3/uL (ref 0.0–0.7)
Lymphocytes Relative: 31.1 % (ref 12.0–46.0)
MCHC: 35 g/dL (ref 30.0–36.0)
Neutrophils Relative %: 55.8 % (ref 43.0–77.0)
RDW: 13.8 % (ref 11.5–14.6)

## 2010-11-21 NOTE — Assessment & Plan Note (Signed)
Summary: 6 MO ROV /NWS #   Vital Signs:  Patient profile:   55 year old male Height:      70.5 inches Weight:      228.13 pounds BMI:     32.39 O2 Sat:      96 % on Room air Temp:     98.4 degrees F oral Pulse rate:   64 / minute BP sitting:   110 / 78  (left arm) Cuff size:   large  Vitals Entered By: Zella Ball Ewing CMA (AAMA) (November 12, 2010 9:00 AM)  O2 Flow:  Room air  CC: 6 month ROV/RE   Primary Care Provider:  Oliver Barre, MD  CC:  6 month ROV/RE.  History of Present Illness: here for wellness and f/u;  overall doing ok - Pt denies CP, worsening sob, doe, wheezing, orthopnea, pnd, worsening LE edema, palps, dizziness or syncope  Pt denies new neuro symptoms such as headache, facial or extremity weakness Pt denies polydipsia, polyuria, or low sugar symptoms such as shakiness improved with eating.  Overall good compliance with meds, trying to follow low chol, DM diet, wt stable, little excercise however  CBG's in the low 100;s.  Overall good compliance with meds, and good tolerability.  No fever, wt loss, night sweats, loss of appetite or other constitutional symptoms  Denies worsening depressive symptoms, suicidal ideation, or panic.   Pt states good ability with ADL's, low fall risk, home safety reviewed and adequate, no significant change in hearing or vision, trying to follow lower chol diet, and occasionally active only with regular excercise.  Does have third finger right hand trigger finger mild symtpomatic but does not want referral at this time.   Problems Prior to Update: 1)  Trigger Finger, Right Middle  (ICD-727.03) 2)  Flatulence Eructation and Gas Pain  (ICD-787.3) 3)  Tachycardia  (ICD-785.0) 4)  Chronic Systolic Heart Failure  (ICD-428.22) 5)  Cervicalgia  (ICD-723.1) 6)  Other Specified Arthropathy Multiple Sites  (ICD-716.89) 7)  Hemochromatosis  (ICD-275.0) 8)  Abnormal Stress Electrocardiogram  (ICD-794.31) 9)  Chest Pain  (ICD-786.50) 10)   Transaminases, Serum, Elevated  (ICD-790.4) 11)  Preventive Health Care  (ICD-V70.0) 12)  Dysfunction of Eustachian Tube  (ICD-381.81) 13)  Preventive Health Care  (ICD-V70.0) 14)  Otitis Media, Serous  (ICD-381.4) 15)  Preventive Health Care  (ICD-V70.0) 16)  Peptic Ulcer Disease  (ICD-533.90) 17)  Anxiety  (ICD-300.00) 18)  Low Back Pain  (ICD-724.2) 19)  Diabetes Mellitus, Type II  (ICD-250.00) 20)  Fatty Liver Disease, Hx of  (ICD-V12.79) 21)  Polyp, Gallbladder  (ICD-575.8) 22)  Hypertension  (ICD-401.9) 23)  Hyperlipidemia  (ICD-272.4) 24)  Gerd  (ICD-530.81) 25)  Colonic Polyps, Hx of  (ICD-V12.72)  Medications Prior to Update: 1)  Toprol Xl 100 Mg Xr24h-Tab (Metoprolol Succinate) .... One Twice A Day 2)  Ecotrin Low Strength 81 Mg  Tbec (Aspirin) .Marland Kitchen.. 1po Qd 3)  Metformin Hcl 500 Mg Xr24h-Tab (Metformin Hcl) .... 2 Tablets Once Daily 4)  Lisinopril 10 Mg Tabs (Lisinopril) .... One Tablet Twice A Day 5)  Zyrtec Allergy 10 Mg Tabs (Cetirizine Hcl) .... As Needed 6)  Flonase 50 Mcg/act Susp (Fluticasone Propionate) .... 2 Sprays Each Side As Needed 7)  Protonix 40 Mg Tbec (Pantoprazole Sodium) .... One Tablet By Mouth Two Times A Day 8)  Amiodarone Hcl 200 Mg Tabs (Amiodarone Hcl) .... 1/2  Tablet Once Daily 9)  Welchol 3.75 Gm Pack (Colesevelam Hcl) .Marland Kitchen.. 1 Pk By Mouth  Once Daily in Water 10)  Align  Caps (Probiotic Product) .... Take One By Mouth Once Daily  Current Medications (verified): 1)  Toprol Xl 100 Mg Xr24h-Tab (Metoprolol Succinate) .... One Twice A Day 2)  Ecotrin Low Strength 81 Mg  Tbec (Aspirin) .Marland Kitchen.. 1po Qd 3)  Metformin Hcl 500 Mg Xr24h-Tab (Metformin Hcl) .... 2 Tablets Once Daily 4)  Lisinopril 10 Mg Tabs (Lisinopril) .... One Tablet Twice A Day 5)  Zyrtec Allergy 10 Mg Tabs (Cetirizine Hcl) .... As Needed 6)  Flonase 50 Mcg/act Susp (Fluticasone Propionate) .... 2 Sprays Each Side As Needed 7)  Protonix 40 Mg Tbec (Pantoprazole Sodium) .... One Tablet By  Mouth Two Times A Day 8)  Amiodarone Hcl 200 Mg Tabs (Amiodarone Hcl) .... 1/2  Tablet Once Daily 9)  Welchol 3.75 Gm Pack (Colesevelam Hcl) .Marland Kitchen.. 1 Pk By Mouth Once Daily in Water 10)  Align  Caps (Probiotic Product) .... Take One By Mouth Once Daily  Allergies (verified): 1)  ! Pcn 2)  ! Zoloft 3)  ! Reglan  Past History:  Past Medical History: Last updated: 09/04/2010 1. Hyperlipidemia. 2. Type 2 diabetes. 3. Gastroesophageal reflux disease. 4. Hypertension. 5. Chronic C-spine pain. 6. Colonic polyps. 7. Low back pain.  The patient is status post spinal fusion surgeries. 8. Hereditary hemochromatosis: Compound heterozygote.  Patient gets periodic phlebotomies 10. Anxiety. 11. Chest pain:  Exercise/adenosine Myoview showed EF of 50% (3/10).  There was inferior hypokinesis.  On perfusion images, there was inferior scar and ischemia in the apical anterior septum and in the inferior wall.  This is suggestive of a coronary disease.  LHC (3/10) showed only luminal irregularities in the coronaries with EF 55%, suggesting that myoview was a false positive, likely from diaphragmatic attenuation.   12. Multifocal atrial tachycardia/wandering atrial pacemaker.  Holter monitor in 5/11 showed frequent runs of symptomatic MAT with heart rate around 100.  Patient is now on amiodarone to try to suppress MAT.  PFTs (6/11): FVC 105%, FEV1 109%, TLC 112%, DLCO 87% (minimal obstructive defect).  LFTs stable on amiodarone.  13. Cardiomyopathy: echo (5/11) showed EF 30% with diffuse hypokinesis, normal wall thickness, mildly decreased RV systolic function.  This may be a cardiomyopathy due to hemochromatosis versus tachy-mediated.  Unable to get cardiac MRI due to claustrophobia.   Past Surgical History: Last updated: 01/16/2009 Inguinal herniorrhaphy s/p back surgery 1991 - lumbar Tonsillectomy  Family History: Last updated: 01/16/2009 mother with colon polyps, MS Father with HTN, ETOH sister with  MS No FH of Colon Cancer: Family History of Diabetes: Mother, PGM, PGF  Social History: Last updated: 01/02/2010 disabled with back pain Married, lives in [place].  Former Smoker-stopped about 7 years ago Alcohol use-yes-3-4 drinks per month 3 children Drug Use - yes - quit in 1980 Daily Caffeine Use-3 drinks daily Patient does not get regular exercise.   Risk Factors: Exercise: no (01/16/2009)  Risk Factors: Smoking Status: quit (11/02/2007)  Review of Systems  The patient denies anorexia, fever, vision loss, decreased hearing, hoarseness, chest pain, syncope, dyspnea on exertion, peripheral edema, prolonged cough, headaches, hemoptysis, abdominal pain, melena, hematochezia, severe indigestion/heartburn, hematuria, muscle weakness, suspicious skin lesions, transient blindness, difficulty walking, depression, unusual weight change, abnormal bleeding, enlarged lymph nodes, and angioedema.         all otherwise negative per pt -    Physical Exam  General:  alert and well-developed.   Head:  normocephalic and atraumatic.   Eyes:  vision grossly intact,  pupils equal, and pupils round.   Ears:  R ear normal and L ear normal.   Nose:  no external deformity and no nasal discharge.   Mouth:  no gingival abnormalities and pharynx pink and moist.   Neck:  supple and no masses.   Lungs:  normal respiratory effort and normal breath sounds.   Heart:  normal rate and regular rhythm.   Abdomen:  soft, non-tender, and normal bowel sounds.   Msk:  no joint tenderness and no joint swelling.   Extremities:  no edema, no erythema  Neurologic:  cranial nerves II-XII intact and strength normal in all extremities.     Impression & Recommendations:  Problem # 1:  Preventive Health Care (ICD-V70.0) Overall doing well, age appropriate education and counseling updated, referral for preventive services and immunizations addressed, dietary counseling and smoking status adressed , most recent  labs reviewed  I have personally reviewed and have noted 1.The patient's medical and social history 2.Their use of alcohol, tobacco or illicit drugs 3.Their current medications and supplements 4. Functional ability including ADL's, fall risk, home safety risk, hearing & visual impairment  5.Diet and physical activities 6.Evidence for depression or mood disorders The patients weight, height, BMI  have been recorded in the chart I have made referrals, counseling and provided education to the patient based review of the above  Orders: TLB-BMP (Basic Metabolic Panel-BMET) (80048-METABOL) TLB-CBC Platelet - w/Differential (85025-CBCD) TLB-Hepatic/Liver Function Pnl (80076-HEPATIC) TLB-Lipid Panel (80061-LIPID) TLB-PSA (Prostate Specific Antigen) (84153-PSA) TLB-TSH (Thyroid Stimulating Hormone) (84443-TSH) TLB-Udip ONLY (81003-UDIP)  Problem # 2:  DIABETES MELLITUS, TYPE II (ICD-250.00)  His updated medication list for this problem includes:    Ecotrin Low Strength 81 Mg Tbec (Aspirin) .Marland Kitchen... 1po qd    Metformin Hcl 500 Mg Xr24h-tab (Metformin hcl) .Marland Kitchen... 2 tablets once daily    Lisinopril 10 Mg Tabs (Lisinopril) ..... One tablet twice a day  Orders: TLB-A1C / Hgb A1C (Glycohemoglobin) (83036-A1C) TLB-Microalbumin/Creat Ratio, Urine (82043-MALB) stable overall by hx and exam, ok to continue meds/tx as is , Pt to cont DM diet, excercise, wt control efforts; to check labs today   Problem # 3:  TRIGGER FINGER, RIGHT MIDDLE (ICD-727.03) consider hand surgeon eval, delcines at this time  Complete Medication List: 1)  Toprol Xl 100 Mg Xr24h-tab (Metoprolol succinate) .... One twice a day 2)  Ecotrin Low Strength 81 Mg Tbec (Aspirin) .Marland Kitchen.. 1po qd 3)  Metformin Hcl 500 Mg Xr24h-tab (Metformin hcl) .... 2 tablets once daily 4)  Lisinopril 10 Mg Tabs (Lisinopril) .... One tablet twice a day 5)  Zyrtec Allergy 10 Mg Tabs (Cetirizine hcl) .... As needed 6)  Flonase 50 Mcg/act Susp (Fluticasone  propionate) .... 2 sprays each side as needed 7)  Protonix 40 Mg Tbec (Pantoprazole sodium) .... One tablet by mouth two times a day 8)  Amiodarone Hcl 200 Mg Tabs (Amiodarone hcl) .... 1/2  tablet once daily 9)  Welchol 3.75 Gm Pack (Colesevelam hcl) .Marland Kitchen.. 1 pk by mouth once daily in water 10)  Align Caps (Probiotic product) .... Take one by mouth once daily  Patient Instructions: 1)  Continue all previous medications as before this visit  2)  Please go to the Lab in the basement for your blood and/or urine tests today 3)  Please call the number on the [facility] Card for results of your testing  4)  Please call if you would like to be referred to Hand Surgury for the right third finger trigger finger 5)  Please schedule a follow-up appointment in 6 months with: 6)  BMP prior to visit, ICD-9: 250.02 7)  Lipid Panel prior to visit, ICD-9: 8)  HbgA1C prior to visit, ICD-9:   Orders Added: 1)  TLB-BMP (Basic Metabolic Panel-BMET) [80048-METABOL] 2)  TLB-CBC Platelet - w/Differential [85025-CBCD] 3)  TLB-Hepatic/Liver Function Pnl [80076-HEPATIC] 4)  TLB-Lipid Panel [80061-LIPID] 5)  TLB-PSA (Prostate Specific Antigen) [84153-PSA] 6)  TLB-TSH (Thyroid Stimulating Hormone) [84443-TSH] 7)  TLB-Udip ONLY [81003-UDIP] 8)  TLB-A1C / Hgb A1C (Glycohemoglobin) [83036-A1C] 9)  TLB-Microalbumin/Creat Ratio, Urine [82043-MALB] 10)  Est. Patient 40-64 years [29562]

## 2011-01-13 ENCOUNTER — Other Ambulatory Visit: Payer: Self-pay | Admitting: Internal Medicine

## 2011-01-17 ENCOUNTER — Other Ambulatory Visit (HOSPITAL_COMMUNITY): Payer: Self-pay | Admitting: Radiology

## 2011-01-17 DIAGNOSIS — R000 Tachycardia, unspecified: Secondary | ICD-10-CM

## 2011-01-20 ENCOUNTER — Ambulatory Visit (HOSPITAL_COMMUNITY): Payer: Medicare PPO | Attending: Cardiology | Admitting: Radiology

## 2011-01-20 DIAGNOSIS — I5022 Chronic systolic (congestive) heart failure: Secondary | ICD-10-CM

## 2011-01-20 DIAGNOSIS — R000 Tachycardia, unspecified: Secondary | ICD-10-CM | POA: Insufficient documentation

## 2011-01-20 DIAGNOSIS — I509 Heart failure, unspecified: Secondary | ICD-10-CM

## 2011-01-28 NOTE — Assessment & Plan Note (Signed)
[name]                            CARDIOLOGY OFFICE NOTE   Keith [name], Keith [name]                    MRN:          [id number]  DATE:11/17/2008                            DOB:          03-21-1956    PRIMARY CARE PHYSICIAN:  Corwin Levins, MD.   HISTORY OF PRESENT ILLNESS:  This is a 55 year old with history of  hyperlipidemia, diabetes, and hypertension who presents to Cardiology  Clinic for followup of an abnormal Myoview and atypical chest pain.  The  patient was referred by Dr. Jonny Ruiz for a Myoview stress test because of a  history of atypical chest pain.  He states that he has had for a number  of years episodes of lower chest pain that come in a band across his  lower chest.  This pain often occurs at night after eating dinner and  typically occurs when he is sitting on the sofa watching TV or something  after dinner.  He has had this for years.  He has thought in the past it  has been due to reflux.  He says Rolaids will sometimes help make the  pain go away.  He is actually taking omeprazole twice a day now.  The  patient denies any chest pain at all with exertion.  He does not get a  whole lot of exercise.  He is on disability for back problems.  He is  able to climb a flight of steps, however, without chest pain or  shortness of breath.  He is able to walk as far as he wants to walk on  flat ground without shortness of breath or chest pain.  The patient is a  former smoker.  He quit back in 2003.  His lipids are under control and  are treated by Dr. Jonny Ruiz, and his blood pressure is 129/90 today.   PAST MEDICAL HISTORY:  1. Hyperlipidemia.  2. Type 2 diabetes.  3. Gastroesophageal reflux disease.  4. Hypertension.  5. Chronic C-spine pain.  6. Colonic polyps.  7. Low back pain.  The patient is status post spinal fusion surgeries.  8. History of probable nonalcoholic steatohepatitis.  The patient has      chronic elevated LFTs.  9.  Peptic ulcer disease.  10.Anxiety.  11.Exercise/adenosine Myoview showed EF of 50%.  There was inferior      hypokinesis.  On perfusion images, there was inferior scar and      ischemia in the apical anterior septum and in the inferior wall.      This is suggestive of a coronary disease.   SOCIAL HISTORY:  The patient is on disability for back problems.  He is  married, has three children.  He is a former smoker, quit in 2003.  He  drinks alcohol occasionally.   FAMILY HISTORY:  There is no family history of premature coronary  disease.   REVIEW OF SYSTEMS:  Negative except as noted in the history of present  illness.   EKG normal sinus rhythm.  Normal EKG.   LABORATORY DATA:  From February 2010, LDL 90, HDL 25, triglycerides  241,  AST 91, ALT 129, creatinine 1.0.  Of note, the patient's Zocor was  recently increased by Dr. Jonny Ruiz after these last labs.   PHYSICAL EXAMINATION:  VITAL SIGNS:  Blood pressure is 129/90, heart  rate 79 and regular.  GENERAL:  This is a mildly obese male in no apparent distress.  NEUROLOGIC:  Alert and oriented x3.  Normal affect.  LUNGS:  Clear to auscultation bilaterally.  Normal respiratory effort.  CARDIOVASCULAR:  Heart regular S1 and S2.  No S3.  No S4.  There is no  murmur.  There is no peripheral edema.  There are 2+ posterior tibial  pulses bilaterally.  There is no carotid bruit.  NECK:  There is no JVD.  There is no thyromegaly or thyroid nodule.  ABDOMEN:  Obese, soft, nontender.  No hepatosplenomegaly.  EXTREMITIES:  No clubbing or cyanosis.  MUSCULOSKELETAL: Normal exam.  SKIN:  Normal exam.  HEENT:  Normal exam.   ASSESSMENT/PLAN:  This is a 56 year old with history of diabetes,  hyperlipidemia and hypertension who presents to Cardiology Clinic with  atypical chest pain and abnormal functional study.  1. Coronary artery disease.  The patient did have a Myoview that was      suggestive of significant ischemia.  He has atypical type  chest      pain which in the past he has attributed to gastroesophageal reflux      disease.  I think given the significant amount of ischemia on his      Myoview, it would be reasonable to go forward with a left heart      catheterization and if there is a significant lesion then to      revascularize accordingly.  I am not sure if this pain represents      true ischemic pain or whether this pain is actually due to his      gastroesophageal reflux disease.  We will have the patient continue      on his aspirin and his beta-blocker and his statin.  We will give a      prescription for sublingual nitroglycerin pills.  2. Hypertension.  The patient's blood pressure is 129/90 with the      lower number a bit high in a diabetic.  After his left heart cath,      I will plan on starting him on lisinopril probably at 5 mg a day.  3. Hyperlipidemia.  The patient's LDL is 90.  My goal for him would be      less than 70.  Zocor was recently increased to 40 mg daily by Dr.      Jonny Ruiz and he will have follow-up lipids after an appropriate      interval.  The treatment of his HDL might be considered.  However,      niaspan could cause problems with his diabetes.  I would recommend      for him that he take 2 g of fish oil a day for his elevated      triglycerides and also for overall vascular health.     Marca Ancona, MD  Electronically Signed   DM/MedQ  DD: 11/17/2008  DT: 11/18/2008  Job #: [id number]   cc:   Corwin Levins, MD

## 2011-01-28 NOTE — Cardiovascular Report (Signed)
NAME:  Keith [name], HOUNSHELL.:  [id number]   MEDICAL RECORD NO.:  [id number]          PATIENT TYPE:  OIB   LOCATION:  1967                         FACILITY:  [facility]   PHYSICIAN:  Marca Ancona, MD      DATE OF BIRTH:  02-16-1956   DATE OF PROCEDURE:  11/22/2008  DATE OF DISCHARGE:                            CARDIAC CATHETERIZATION   PRIMARY CARE PHYSICIAN:  Corwin Levins, MD   PROCEDURE:  1. Left heart catheterization.  2. Coronary angiography.  3. Left ventriculography.   INDICATION:  This is a 55 year old with history of diabetes,  hyperlipidemia, and hypertension who comes today for heart  catheterization because of atypical chest pain and an abnormal Myoview  suggestive of inferior ischemia and infarction.   PROCEDURE NOTE:  After informed consent was obtained, the right groin  was sterilely prepped and draped.  The right groin was locally  anesthetized with 1% lidocaine.  The right common femoral artery was  engaged using Seldinger technique and a 4-French arterial sheath was  placed.  The left coronary artery was engaged using the 4-French JL-4  catheter.  The right coronary artery was engaged using a 4-French 3DRC  catheter and the left ventricle was entered using the 4-French angled  pigtail catheter.   FINDINGS:  1. Hemodynamics:  LV 115/4/6, aorta 120/79.  2. Left ventriculography:  LVEF is estimated at about 55%.  There is      no mitral regurgitation.  There are no regional wall motion      abnormalities noted in the RAO view.  3. Coronary angiography:  The coronary system is right dominant.  The      RCA shows no significant coronary artery disease.  The left main is      free from significant coronary artery disease.  The LAD shows mild      luminal irregularities.  The left circumflex has a small first      obtuse marginal and a large second obtuse marginal.  There is no      significant disease in the circumflex.   ASSESSMENT AND PLAN:  This  is a 55 year old with history of  hyperlipidemia, hypertension, diabetes who presented to Cardiology  Clinic after developing atypical chest pain and having a Myoview  suggestive of inferior ischemia and infarction.  We did his left heart  catheterization today.  There is no obstructive coronary artery disease.  He has mild luminal irregularities in his left anterior descending.  I  think his chest pain is probably noncardiac in nature.  The stress test  may have been positive due to diaphragmatic attenuation.  His risk  factors should continued to be aggressively managed.  We are going to  add lisinopril 5 mg daily to his regimen given his diabetes.      Marca Ancona, MD  Electronically Signed     DM/MEDQ  D:  11/22/2008  T:  11/23/2008  Job:  [id number]   cc:   Corwin Levins, MD

## 2011-02-14 ENCOUNTER — Other Ambulatory Visit: Payer: Self-pay | Admitting: Cardiology

## 2011-02-17 ENCOUNTER — Encounter: Payer: Self-pay | Admitting: Cardiology

## 2011-02-17 NOTE — Telephone Encounter (Signed)
I would send to Dr Jarold Motto for refill

## 2011-02-17 NOTE — Telephone Encounter (Signed)
Looks like he sees Dr Jarold Motto in GI. I would send to Dr Jarold Motto for refill.

## 2011-02-18 ENCOUNTER — Other Ambulatory Visit: Payer: Self-pay

## 2011-02-18 MED ORDER — PANTOPRAZOLE SODIUM 40 MG PO TBEC: 40.0000 mg | DELAYED_RELEASE_TABLET | Freq: Two times a day (BID) | ORAL | Status: DC

## 2011-02-18 MED ORDER — PANTOPRAZOLE SODIUM 40 MG PO TBEC: 40.0000 mg | DELAYED_RELEASE_TABLET | Freq: Every day | ORAL | Status: DC

## 2011-03-11 ENCOUNTER — Encounter: Payer: Self-pay | Admitting: Cardiology

## 2011-03-11 ENCOUNTER — Ambulatory Visit (INDEPENDENT_AMBULATORY_CARE_PROVIDER_SITE_OTHER): Payer: Medicare PPO | Admitting: Cardiology

## 2011-03-11 VITALS — BP 124/89 | HR 81 | Ht 70.0 in | Wt 227.0 lb

## 2011-03-11 DIAGNOSIS — R000 Tachycardia, unspecified: Secondary | ICD-10-CM

## 2011-03-11 DIAGNOSIS — I5022 Chronic systolic (congestive) heart failure: Secondary | ICD-10-CM

## 2011-03-11 NOTE — Assessment & Plan Note (Signed)
Patient has had multifocal atrial tachycardia and wandering atrial pacemaker noted by holter and ECG.  Hemochromatosis with infiltration of the atria may be the cause of this arrhythmia.  No atrial fibrillation noted so far.  He had a significant burden of MAT, so I now have him on amiodarone and Toprol XL.  This has resolved his palpitations.  Continue amiodarone at 100 mg daily.  Mild elevation of LFTs likely will be chronic related to hemochromatosis.  Repeat LFTs and TSH in 8/12.  Needs yearly eye exam.

## 2011-03-11 NOTE — Assessment & Plan Note (Signed)
EF as low as 30% in the past with luminal irregularities in the coronaries.  Cardiomyopathy was thought to be due to hemochromatosis versus tachycardia-mediated cardiomyopathy from frequent MAT.  He has been treated medically with Toprol XL and lisinopril, and EF was improved to 55-60% on last echo in 5/12.   Followup in 1 year as long as he is symptomatically stable.

## 2011-03-11 NOTE — Progress Notes (Signed)
PCP: Dr. Jonny Ruiz  55 yo with h/o DM2, hyperlipidemia, left heart cath in 3/10 showing mild luminal irregularities, hereditary hemochromatosis with periodic phlebotomy treatment, and atrial tachycardia presents for followup.  He has been noted to have a nonischemic cardiomyopathy with EF as low as 30% thought to be related to hemochromatosis.  Atrial tachycardia was symptomatic and has been suppressed by amiodarone and Toprol XL.   Patient had repeat echo on medical treatment of cardiomyopathy in 5/12.  This showed EF 55-60% (improved to normal range).  He has been doing well lately.  Main problem has been atypical chest pain that seems to be related to meals and drinking carbonated soft drinks.  No exertional chest pain. No exertional dyspnea.  No palpitations/sensation of racing heart.   Labs (5/11): BNP 21 Labs (6/11): TSH normal, AST 43, ALT 61 Labs (8/11): K 5.3, creatinine 1.2, TGs 233, HDL 69, LDL 134 Labs (9/11): K 4.5, creatinine 1.2, LFTs normal, TSH normal, LDL 104, HDL 30 Labs (2/12): K 4.6, creatinine 1.2, AST 48, ALT 62, LDL 99, HDL 26, TSH normal   ECG: NSR, normal  Allergies (verified):  1)  ! Pcn 2)  ! Zoloft 3)  ! Reglan  Past Medical History: 1. Hyperlipidemia. 2. Type 2 diabetes. 3. Gastroesophageal reflux disease. 4. Hypertension. 5. Chronic C-spine pain. 6. Colonic polyps. 7. Low back pain.  The patient is status post spinal fusion surgeries. 8. Hereditary hemochromatosis: Compound heterozygote.  Patient gets periodic phlebotomies 10. Anxiety. 11. Chest pain:  Exercise/adenosine Myoview showed EF of 50% (3/10).  There was inferior hypokinesis.  On perfusion images, there was inferior scar and ischemia in the apical anterior septum and in the inferior wall.  This is suggestive of a coronary disease. LHC (3/10) showed only luminal irregularities in the coronaries with EF 55%, suggesting that myoview was a false positive, likely from diaphragmatic attenuation.   12.  Multifocal atrial tachycardia/wandering atrial pacemaker.  Holter monitor in 5/11 showed frequent runs of symptomatic MAT with heart rate around 100.  Patient is now on amiodarone to try to suppress MAT.  PFTs (6/11): FVC 105%, FEV1 109%, TLC 112%, DLCO 87% (minimal obstructive defect).  LFTs stable on amiodarone.  13. Cardiomyopathy: echo (5/11) showed EF 30% with diffuse hypokinesis, normal wall thickness, mildly decreased RV systolic function.  This may be a cardiomyopathy due to hemochromatosis versus tachy-mediated.  Unable to get cardiac MRI due to claustrophobia.  Echo (5/12): EF 55-60%, mild LAE.   Family History: mother with colon polyps, MS Father with HTN, ETOH sister with MS No FH of Colon Cancer: Family History of Diabetes: Mother, PGM, PGF  Social History: disabled with back pain Married, lives in [place].  Former Smoker-quit around 2004 Alcohol use-yes-3-4 drinks per month 3 children Drug Use - yes - quit in 1980 Daily Caffeine Use-3 drinks daily Patient does not get regular exercise.   Current Outpatient Prescriptions  Medication Sig Dispense Refill  . amiodarone (PACERONE) 200 MG tablet Take 100 mg by mouth daily.        Marland Kitchen aspirin 81 MG EC tablet Take 81 mg by mouth daily.        . bifidobacterium infantis (ALIGN) capsule Take 1 capsule by mouth daily.        . cetirizine (ZYRTEC) 10 MG tablet Take 10 mg by mouth as needed.        . Colesevelam HCl (WELCHOL) 3.75 G PACK Take 3.75 g by mouth daily.        Marland Kitchen  fluticasone (FLONASE) 50 MCG/ACT nasal spray Place 2 sprays into the nose as needed.        Marland Kitchen lisinopril (PRINIVIL,ZESTRIL) 10 MG tablet Take 10 mg by mouth 2 (two) times daily.        . metFORMIN (GLUCOPHAGE-XR) 500 MG 24 hr tablet Take 1 tablet (500 mg total) by mouth 2 (two) times daily.  60 tablet  11  . metoprolol (TOPROL-XL) 100 MG 24 hr tablet Take 100 mg by mouth 2 (two) times daily.        . pantoprazole (PROTONIX) 40 MG tablet Take 1 tablet (40 mg  total) by mouth daily.  30 tablet  6    BP 124/89  Pulse 81  Ht 5\' 10"  (1.778 m)  Wt 227 lb (102.967 kg)  BMI 32.57 kg/m2 General:  Well developed, well nourished, in no acute distress. Neck:  Neck supple, no JVD. No masses, thyromegaly or abnormal cervical nodes. Lungs:  Clear bilaterally to auscultation and percussion. Heart:  Non-displaced PMI, chest non-tender; regular rate and rhythm, S1, S2 without murmurs, rubs or gallops. Carotid upstroke normal, no bruit.  Pedals normal pulses. No edema, no varicosities. Abdomen:  Bowel sounds positive; abdomen soft and non-tender without masses, organomegaly, or hernias noted. No hepatosplenomegaly. Extremities:  No clubbing or cyanosis. Neurologic:  Alert and oriented x 3. Psych:  Normal affect.

## 2011-03-11 NOTE — Patient Instructions (Signed)
Schedule an appointment for lab in August 2012--BMP/TSH/Liver profile 428.22  Your physician wants you to follow-up in: 1 year with Dr Shirlee Latch. (June 2013). You will receive a reminder letter in the mail two months in advance. If you don't receive a letter, please call our office to schedule the follow-up appointment.

## 2011-03-17 ENCOUNTER — Telehealth: Payer: Self-pay | Admitting: *Deleted

## 2011-03-17 NOTE — Telephone Encounter (Signed)
I have put in orders but not signed until pt calls back with date he can come for labs.

## 2011-03-17 NOTE — Telephone Encounter (Signed)
Message copied by Leonette Monarch on Mon Mar 17, 2011  3:42 PM ------      Message from: Harlow Mares D      Created: Wed Nov 20, 2010  9:54 AM       lfts and ferririn

## 2011-03-18 NOTE — Telephone Encounter (Signed)
Spoke with pt and he will do his labs on 03/20/11.

## 2011-03-20 ENCOUNTER — Other Ambulatory Visit (INDEPENDENT_AMBULATORY_CARE_PROVIDER_SITE_OTHER): Payer: Medicare PPO

## 2011-03-20 LAB — HEPATIC FUNCTION PANEL
ALT: 40 U/L (ref 0–53)
AST: 27 U/L (ref 0–37)
Total Bilirubin: 0.4 mg/dL (ref 0.3–1.2)

## 2011-03-20 LAB — FERRITIN: Ferritin: 29.7 ng/mL (ref 22.0–322.0)

## 2011-03-24 ENCOUNTER — Telehealth: Payer: Self-pay | Admitting: *Deleted

## 2011-03-24 NOTE — Telephone Encounter (Signed)
Message copied by Leonette Monarch on Mon Mar 24, 2011  8:26 AM ------      Message from: Ferrer Comunidad, DAVID R      Created: Sun Mar 23, 2011  3:57 PM       Check ferritin level in 3 months. I would continue to do this unless his ferritin level exceeds 100. Then we will reinstitute phlebotomies.

## 2011-03-28 NOTE — Telephone Encounter (Signed)
Left message on machine that i will contact pt about coming for ferritin.

## 2011-04-24 ENCOUNTER — Other Ambulatory Visit: Payer: Self-pay | Admitting: Cardiology

## 2011-04-28 ENCOUNTER — Other Ambulatory Visit (INDEPENDENT_AMBULATORY_CARE_PROVIDER_SITE_OTHER): Payer: Medicare PPO | Admitting: *Deleted

## 2011-04-28 DIAGNOSIS — I5022 Chronic systolic (congestive) heart failure: Secondary | ICD-10-CM

## 2011-04-28 DIAGNOSIS — E119 Type 2 diabetes mellitus without complications: Secondary | ICD-10-CM

## 2011-04-28 LAB — HEPATIC FUNCTION PANEL
Albumin: 4.1 g/dL (ref 3.5–5.2)
Alkaline Phosphatase: 58 U/L (ref 39–117)

## 2011-04-28 LAB — BASIC METABOLIC PANEL
CO2: 27 mEq/L (ref 19–32)
Chloride: 101 mEq/L (ref 96–112)
Sodium: 138 mEq/L (ref 135–145)

## 2011-05-02 ENCOUNTER — Other Ambulatory Visit: Payer: Self-pay | Admitting: *Deleted

## 2011-05-06 ENCOUNTER — Telehealth: Payer: Self-pay

## 2011-05-06 NOTE — Telephone Encounter (Signed)
Ordered labs per protocol

## 2011-05-07 ENCOUNTER — Other Ambulatory Visit: Payer: Medicare PPO

## 2011-05-12 ENCOUNTER — Other Ambulatory Visit: Payer: Self-pay | Admitting: Internal Medicine

## 2011-05-12 ENCOUNTER — Other Ambulatory Visit (INDEPENDENT_AMBULATORY_CARE_PROVIDER_SITE_OTHER): Payer: Medicare PPO

## 2011-05-12 LAB — LIPID PANEL
Cholesterol: 189 mg/dL (ref 0–200)
Total CHOL/HDL Ratio: 6
Triglycerides: 366 mg/dL — ABNORMAL HIGH (ref 0.0–149.0)

## 2011-05-12 LAB — BASIC METABOLIC PANEL WITH GFR
BUN: 13 mg/dL (ref 6–23)
CO2: 24 meq/L (ref 19–32)
Calcium: 9.3 mg/dL (ref 8.4–10.5)
Chloride: 104 meq/L (ref 96–112)
Creatinine, Ser: 1.1 mg/dL (ref 0.4–1.5)
GFR: 71.85 mL/min (ref >60.00)
Glucose, Bld: 119 mg/dL — ABNORMAL HIGH (ref 70–99)
Potassium: 4.3 meq/L (ref 3.5–5.1)
Sodium: 137 meq/L (ref 135–145)

## 2011-05-13 ENCOUNTER — Encounter: Payer: Self-pay | Admitting: Internal Medicine

## 2011-05-13 DIAGNOSIS — Z0000 Encounter for general adult medical examination without abnormal findings: Secondary | ICD-10-CM | POA: Insufficient documentation

## 2011-05-14 ENCOUNTER — Encounter: Payer: Self-pay | Admitting: Internal Medicine

## 2011-05-14 ENCOUNTER — Ambulatory Visit (INDEPENDENT_AMBULATORY_CARE_PROVIDER_SITE_OTHER): Payer: Medicare PPO | Admitting: Internal Medicine

## 2011-05-14 VITALS — BP 110/80 | HR 70 | Temp 98.9°F | Ht 70.0 in | Wt 228.2 lb

## 2011-05-14 DIAGNOSIS — E785 Hyperlipidemia, unspecified: Secondary | ICD-10-CM

## 2011-05-14 DIAGNOSIS — F411 Generalized anxiety disorder: Secondary | ICD-10-CM

## 2011-05-14 DIAGNOSIS — E119 Type 2 diabetes mellitus without complications: Secondary | ICD-10-CM

## 2011-05-14 DIAGNOSIS — I10 Essential (primary) hypertension: Secondary | ICD-10-CM

## 2011-05-14 DIAGNOSIS — Z0000 Encounter for general adult medical examination without abnormal findings: Secondary | ICD-10-CM

## 2011-05-14 MED ORDER — DIAZEPAM 5 MG PO TABS: 5.0000 mg | ORAL_TABLET | Freq: Three times a day (TID) | ORAL | Status: AC | PRN

## 2011-05-14 NOTE — Progress Notes (Signed)
Subjective:    Patient ID: Keith [name], male    DOB: 1955/10/11, 55 y.o.   MRN: [id number]  HPI  Here to f/u; overall doing ok,  Pt denies chest pain, increased sob or doe, wheezing, orthopnea, PND, increased LE swelling, palpitations, dizziness or syncope.  Pt denies new neurological symptoms such as new headache, or facial or extremity weakness or numbness   Pt denies polydipsia, polyuria, or low sugar symptoms such as weakness or confusion improved with po intake.  Pt states overall good compliance with meds, trying to follow lower cholesterol, diabetic diet, wt overall stable but little exercise however.  Only takes the welchol about 1/3 of the time.  Recent ferritin 29 (low), LFT's stable.   Past Medical History  Diagnosis Date  . Hyperlipidemia   . Diabetes mellitus   . GERD (gastroesophageal reflux disease)   . Hypertension   . Cervical spine pain     Chronic  . History of colonic polyps   . Low back pain     The pt is S/P spinal fusion surgeries  . Hereditary hemochromatosis     Compound heterozygote. Pt gets periodic phlebotomies  . Anxiety   . Chest pain     Exercise/adenosine Myoview showed EF 50% (3/10). Inferior hypokinesis. Perfusion images, inferior scar and ischemia in the apical anterior septum and in teh inferior wall . Suggestive of a CD. LHC (3/10) showed only lu minal irregulatrities in the coronaries w/ EF 55%, suggesting that myoview was a false positive, likely from diaphragmatic attnuation.  . Multifocal atrial tachycardia     Wandering atrial pacemaker. Holter monior in 5/11 showed frequent runs of symtomatic MAT with heart rate around 100. Pt is now  on amiodarone to try to suppress MAT. PFTs (6/11): FVC 105%, FEV1 109%, TLC 112%, DLCO 87% (minimal obstructive effect). LFTs stable on amiodarone  . Cardiomyopathy     Echo (5/11) showed EF 30% with diffuse hypokinesis, normal wall thickness, mildly decreased RV systolic function. This may be a cardiomyopathy due  to hemochromatosis versus tachy-mediated. Unable to get cardiac MRI due to claustrophobia.   Past Surgical History  Procedure Date  . Inguinal hernia repair   . Back surgery 1991    Lumbar  . Tonsillectomy     reports that he quit smoking about 7 years ago. He does not have any smokeless tobacco history on file. He reports that he drinks about .5 ounces of alcohol per week. He reports that he uses illicit drugs. family history includes Alcohol abuse in his father; Colon polyps in his mother; Diabetes in his mother, paternal grandfather, and paternal grandmother; Hypertension in his father; and Multiple sclerosis in his mother and sister.  There is no history of Colon cancer. Allergies  Allergen Reactions  . Metoclopramide Hcl   . Penicillins   . Sertraline Hcl    Current Outpatient Prescriptions on File Prior to Visit  Medication Sig Dispense Refill  . amiodarone (PACERONE) 200 MG tablet Take 100 mg by mouth daily.        Marland Kitchen aspirin 81 MG EC tablet Take 81 mg by mouth daily.        . bifidobacterium infantis (ALIGN) capsule Take 1 capsule by mouth daily.        . cetirizine (ZYRTEC) 10 MG tablet Take 10 mg by mouth as needed.        . Colesevelam HCl (WELCHOL) 3.75 G PACK Take 3.75 g by mouth daily.        Marland Kitchen  fluticasone (FLONASE) 50 MCG/ACT nasal spray Place 2 sprays into the nose as needed.        Marland Kitchen lisinopril (PRINIVIL,ZESTRIL) 10 MG tablet TAKE ONE TABLET BY MOUTH TWICE DAILY  60 tablet  6  . metFORMIN (GLUCOPHAGE-XR) 500 MG 24 hr tablet Take 1 tablet (500 mg total) by mouth 2 (two) times daily.  60 tablet  11  . metoprolol (TOPROL-XL) 100 MG 24 hr tablet Take 100 mg by mouth 2 (two) times daily.        . pantoprazole (PROTONIX) 40 MG tablet Take 1 tablet (40 mg total) by mouth daily.  30 tablet  6   Review of Systems Review of Systems  Constitutional: Negative for diaphoresis and unexpected weight change.  HENT: Negative for drooling and tinnitus.   Eyes: Negative for photophobia  and visual disturbance.  Respiratory: Negative for choking and stridor.   Gastrointestinal: Negative for vomiting and blood in stool.  Genitourinary: Negative for hematuria and decreased urine volume.   Objective:   Physical Exam BP 110/80  Pulse 70  Temp(Src) 98.9 F (37.2 C) (Oral)  Ht 5\' 10"  (1.778 m)  Wt 228 lb 3.2 oz (103.511 kg)  BMI 32.74 kg/m2  SpO2 97% Physical Exam  VS noted Constitutional: Pt appears well-developed and well-nourished.  HENT: Head: Normocephalic.  Right Ear: External ear normal.  Left Ear: External ear normal.  Eyes: Conjunctivae and EOM are normal. Pupils are equal, round, and reactive to light.  Neck: Normal range of motion. Neck supple.  Cardiovascular: Normal rate and regular rhythm.   Pulmonary/Chest: Effort normal and breath sounds normal.  Abd:  Soft, NT, non-distended, + BS Neurological: Pt is alert. No cranial nerve deficit.  Skin: Skin is warm. No erythema.  Psychiatric: Pt behavior is normal. Thought content normal.        Assessment & Plan:

## 2011-05-14 NOTE — Assessment & Plan Note (Signed)
stable overall by hx and exam, most recent data reviewed with pt, and pt to continue medical treatment as before  BP Readings from Last 3 Encounters:  05/14/11 110/80  03/11/11 124/89  11/12/10 110/78

## 2011-05-14 NOTE — Patient Instructions (Signed)
Continue all other medications as before Please keep your appointments with your specialists as you have planned Please return in 6 mo with Lab testing done 3-5 days before  

## 2011-05-14 NOTE — Assessment & Plan Note (Signed)
stable overall by hx and exam, most recent data reviewed with pt, and pt to continue medical treatment as before  Lab Results  Component Value Date   WBC 8.4 11/12/2010   HGB 14.9 11/12/2010   HCT 42.5 11/12/2010   PLT 230.0 11/12/2010   CHOL 189 05/12/2011   TRIG 366.0* 05/12/2011   HDL 32.70* 05/12/2011   LDLDIRECT 102.6 05/12/2011   ALT 55* 04/28/2011   AST 36 04/28/2011   NA 137 05/12/2011   K 4.3 05/12/2011   CL 104 05/12/2011   CREATININE 1.1 05/12/2011   BUN 13 05/12/2011   CO2 24 05/12/2011   TSH 4.85 04/28/2011   PSA 0.40 11/12/2010   INR 1.0 RATIO 11/17/2008   HGBA1C 6.7* 05/12/2011   MICROALBUR 1.9 11/12/2010

## 2011-05-14 NOTE — Assessment & Plan Note (Signed)
stable overall by hx and exam, most recent data reviewed with pt, and pt to continue medical treatment as before  Lab Results  Component Value Date   HGBA1C 6.7* 05/12/2011

## 2011-05-14 NOTE — Assessment & Plan Note (Signed)
stable overall by hx and exam, most recent data reviewed with pt, and pt to continue medical treatment as before  Lab Results  Component Value Date   LDLCALC 79 11/30/2008

## 2011-05-20 ENCOUNTER — Other Ambulatory Visit: Payer: Self-pay | Admitting: Internal Medicine

## 2011-06-19 ENCOUNTER — Other Ambulatory Visit: Payer: Self-pay | Admitting: Cardiology

## 2011-06-26 ENCOUNTER — Telehealth: Payer: Self-pay

## 2011-06-26 ENCOUNTER — Other Ambulatory Visit (INDEPENDENT_AMBULATORY_CARE_PROVIDER_SITE_OTHER): Payer: Medicare PPO

## 2011-06-26 LAB — FERRITIN: Ferritin: 46.2 ng/mL (ref 22.0–322.0)

## 2011-06-26 NOTE — Telephone Encounter (Signed)
Pt aware to have labs  

## 2011-06-26 NOTE — Telephone Encounter (Signed)
Message copied by Donata Duff on Thu Jun 26, 2011  2:22 PM ------      Message from: Donata Duff      Created: Tue Jun 24, 2011  3:48 PM                   ----- Message -----         From: Harlow Mares, CMA         Sent: 06/16/2011           To: Harlow Mares, CMA            Liver function tests are normal and ferritin level is normal. I would repeat ferritin in 3 months. If still normal I would repeat in 6 months from then.

## 2011-06-27 ENCOUNTER — Telehealth: Payer: Self-pay

## 2011-06-27 NOTE — Telephone Encounter (Signed)
Message copied by Annett Fabian on Fri Jun 27, 2011  4:34 PM ------      Message from: PATTERSON, DAVID R      Created: Fri Jun 27, 2011  2:31 PM       Need alpha fetoproein level and liver ultrasound,no phlebotomy

## 2011-06-27 NOTE — Telephone Encounter (Signed)
Patient advised of [place] scheduled for 07/01/11 8:00.  Patient advised to be NPO and to come for labs here after his [place]

## 2011-07-01 ENCOUNTER — Ambulatory Visit (HOSPITAL_COMMUNITY)
Admission: RE | Admit: 2011-07-01 | Discharge: 2011-07-01 | Disposition: A | Discharge status: Home / Self Care | Payer: Medicare PPO | Source: Ambulatory Visit | Attending: Gastroenterology | Admitting: Gastroenterology

## 2011-07-01 DIAGNOSIS — K824 Cholesterolosis of gallbladder: Secondary | ICD-10-CM | POA: Insufficient documentation

## 2011-07-01 DIAGNOSIS — I10 Essential (primary) hypertension: Secondary | ICD-10-CM | POA: Insufficient documentation

## 2011-07-01 DIAGNOSIS — E119 Type 2 diabetes mellitus without complications: Secondary | ICD-10-CM | POA: Insufficient documentation

## 2011-07-02 ENCOUNTER — Ambulatory Visit (INDEPENDENT_AMBULATORY_CARE_PROVIDER_SITE_OTHER): Payer: Medicare PPO | Admitting: Physician Assistant

## 2011-07-02 ENCOUNTER — Encounter: Payer: Self-pay | Admitting: Physician Assistant

## 2011-07-02 ENCOUNTER — Telehealth: Payer: Self-pay

## 2011-07-02 ENCOUNTER — Other Ambulatory Visit: Payer: Medicare PPO | Admitting: *Deleted

## 2011-07-02 VITALS — BP 119/80 | HR 68 | Resp 18 | Ht 70.0 in | Wt 227.4 lb

## 2011-07-02 DIAGNOSIS — I5022 Chronic systolic (congestive) heart failure: Secondary | ICD-10-CM

## 2011-07-02 DIAGNOSIS — R002 Palpitations: Secondary | ICD-10-CM | POA: Insufficient documentation

## 2011-07-02 DIAGNOSIS — R079 Chest pain, unspecified: Secondary | ICD-10-CM

## 2011-07-02 DIAGNOSIS — K219 Gastro-esophageal reflux disease without esophagitis: Secondary | ICD-10-CM

## 2011-07-02 DIAGNOSIS — I10 Essential (primary) hypertension: Secondary | ICD-10-CM

## 2011-07-02 MED ORDER — PANTOPRAZOLE SODIUM 40 MG PO TBEC: 40.0000 mg | DELAYED_RELEASE_TABLET | Freq: Two times a day (BID) | ORAL | Status: DC

## 2011-07-02 NOTE — Assessment & Plan Note (Signed)
?  if gurgling symptom is resurgence of MAT.  Will set up 48 hr holter.  Follow up in 3-4 weeks.

## 2011-07-02 NOTE — Patient Instructions (Signed)
Your physician recommends that you schedule a follow-up appointment in: 07/30/11 @ 8:30 TO SEE SCOTT WEAVER, PA-C  Your physician has recommended you make the following change in your medication: INCREASE PROTONIX TO 40 MG 1 TAB TWICE DAILY  Your physician has requested that you have an exercise tolerance test DX 786.50 CHEST PAIN. For further information please visit [email or web address] Please also follow instruction sheet, as given.  Your physician has recommended that you wear a 48 HOUR holter monitor DX 785.1 PALPITATIONS. Holter monitors are medical devices that record the heart's electrical activity. Doctors most often use these monitors to diagnose arrhythmias. Arrhythmias are problems with the speed or rhythm of the heartbeat. The monitor is a small, portable device. You can wear one while you do your normal daily activities. This is usually used to diagnose what is causing palpitations/syncope (passing out).

## 2011-07-02 NOTE — Assessment & Plan Note (Signed)
Increase PPI as noted.

## 2011-07-02 NOTE — Assessment & Plan Note (Signed)
Volume stable.  Echo in 5/12 with normalized LVF.

## 2011-07-02 NOTE — Progress Notes (Signed)
History of Present Illness: Primary Cardiologist:  Dr. 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  He has a h/o DM2, hyperlipidemia, left heart cath in 3/10 showing mild luminal irregularities, hereditary hemochromatosis with periodic phlebotomy treatment, and atrial tachycardia.  He has been noted to have a nonischemic cardiomyopathy with EF as low as 30% thought to be related to hemochromatosis.  Atrial tachycardia was symptomatic and has been suppressed by amiodarone and Toprol XL.  He had repeat echo on medical treatment of cardiomyopathy in 5/12:  EF 55-60% (improved to normal range).    Over the last 3-4 months, he has noted chest discomfort.  It is hard to qualify.  States it feels like a "gurgling."  He notes heaviness as well as sharp pain across his anterior chest.  Really denies exertional symptoms.  Notes that he is "short of breath all the time."  He attributes it to back problems and "being lazy."  Really describes class 2 symptoms.  No associated dyspnea with his CP.  No associated syncope or nausea.  Has felt diaphoretic.  No orthopnea or PND.  No edema.  Denies resurgence of palpitations like he had prior to amiodarone Rx.  Notes chest discomfort almost always with lying down.  He thought it was related to GERD.  Of note, sees Dr. Jarold Motto tomorrow for recent abdominal ultrasound showing increased echogenecity c/w cirrhosis or steatosis.  Labs (5/11): BNP 21 Labs (6/11): TSH normal, AST 43, ALT 61 Labs (8/11): K 5.3, creatinine 1.2, TGs 233, HDL 69, LDL 134 Labs (9/11): K 4.5, creatinine 1.2, LFTs normal, TSH normal, LDL 104, HDL 30 Labs (2/12): K 4.6, creatinine 1.2, AST 48, ALT 62, LDL 99, HDL 26, TSH normal  Labs  Past Medical History: 1. Hyperlipidemia. 2. Type 2 diabetes. 3. Gastroesophageal reflux disease. 4. Hypertension. 5. Chronic C-spine pain. 6. Colonic polyps. 7. Low back pain.  The patient is status post  spinal fusion surgeries. 8. Hereditary hemochromatosis: Compound heterozygote.  Patient gets periodic phlebotomies 10. Anxiety. 11. Chest pain:  Exercise/adenosine Myoview showed EF of 50% (3/10).  There was inferior hypokinesis.  On perfusion images, there was inferior scar and ischemia in the apical anterior septum and in the inferior wall.  This is suggestive of a coronary disease. LHC (3/10) showed only luminal irregularities in the coronaries with EF 55%, suggesting that myoview was a false positive, likely from diaphragmatic attenuation.   12. Multifocal atrial tachycardia/wandering atrial pacemaker.  Holter monitor in 5/11 showed frequent runs of symptomatic MAT with heart rate around 100.  Patient is now on amiodarone to try to suppress MAT.  PFTs (6/11): FVC 105%, FEV1 109%, TLC 112%, DLCO 87% (minimal obstructive defect).  LFTs stable on amiodarone.  13. Cardiomyopathy: echo (5/11) showed EF 30% with diffuse hypokinesis, normal wall thickness, mildly decreased RV systolic function.  This may be a cardiomyopathy due to hemochromatosis versus tachy-mediated.  Unable to get cardiac MRI due to claustrophobia.  Echo (5/12): EF 55-60%, mild LAE.     Current Outpatient Prescriptions  Medication Sig Dispense Refill  . amiodarone (PACERONE) 200 MG tablet Take 100 mg by mouth daily.        Marland Kitchen aspirin 81 MG EC tablet Take 81 mg by mouth daily.        . bifidobacterium infantis (ALIGN) capsule Take 1 capsule by mouth daily.        . cetirizine (ZYRTEC) 10 MG tablet Take  10 mg by mouth as needed.        . diazepam (VALIUM) 5 MG tablet Take 1 tablet (5 mg total) by mouth every 8 (eight) hours as needed for anxiety or sleep.  30 tablet  0  . fluticasone (FLONASE) 50 MCG/ACT nasal spray Place 2 sprays into the nose as needed.        Marland Kitchen lisinopril (PRINIVIL,ZESTRIL) 10 MG tablet TAKE ONE TABLET BY MOUTH TWICE DAILY  60 tablet  6  . metFORMIN (GLUCOPHAGE-XR) 500 MG 24 hr tablet Take 1 tablet (500 mg total)  by mouth 2 (two) times daily.  60 tablet  11  . metoprolol (TOPROL-XL) 100 MG 24 hr tablet TAKE ONE TABLET BY MOUTH TWICE DAILY  180 tablet  3  . pantoprazole (PROTONIX) 40 MG tablet Take 1 tablet (40 mg total) by mouth daily.  30 tablet  6  . WELCHOL 3.75 G PACK TAKE ONE PACKET BY MOUTH EVERY DAY IN  WATER  90 each  3    Allergies: Allergies  Allergen Reactions  . Metoclopramide Hcl   . Penicillins   . Sertraline Hcl     Family History: mother with colon polyps, MS Father with HTN, ETOH sister with MS No FH of Colon Cancer: Family History of Diabetes: Mother, PGM, PGF  Social History: disabled with back pain Married, lives in [place].  Former Smoker-quit around 2004 Alcohol use-yes-3-4 drinks per month 3 children Drug Use - yes - quit in 1980 Daily Caffeine Use-3 drinks daily Patient does not get regular exercise.  ROS:  Please see the history of present illness.  All other systems reviewed and negative.   Vital Signs: BP 119/80  Pulse 68  Resp 18  Ht 5\' 10"  (1.778 m)  Wt 227 lb 6.4 oz (103.148 kg)  BMI 32.63 kg/m2  PHYSICAL EXAM: Well nourished, well developed, in no acute distress HEENT: normal Neck: no JVD Vascular:  No carotid bruits Cardiac:  normal S1, S2; RRR; no murmur; no rubs Lungs:  clear to auscultation bilaterally, no wheezing, rhonchi or rales Abd: soft, nontender, no hepatomegaly Ext: no edema Skin: warm and dry Neuro:  CNs 2-12 intact, no focal abnormalities noted Psych: normal affect  EKG:  NSR, HR 66, NSSTTW changes  ASSESSMENT AND PLAN:

## 2011-07-02 NOTE — Assessment & Plan Note (Signed)
Controlled.  

## 2011-07-02 NOTE — Assessment & Plan Note (Signed)
I suspect this is related to GERD by his description.  Likelihood of developing obstructive CAD in 2 years is quite low.  He thinks he can walk on a treadmill for a plain ETT.  I will schedule him for an ETT with me or Dr. Shirlee Latch.  He does not have any rub on exam and I do not believe his symptoms represent pericarditis.  Plus, his ECG is ok.  I have asked him to go ahead and take Protonix BID.  He follows up with GI tomorrow and can discuss his symptoms further at that time.  Follow up in 3-4 weeks with me or Dr. Shirlee Latch.

## 2011-07-03 ENCOUNTER — Ambulatory Visit (INDEPENDENT_AMBULATORY_CARE_PROVIDER_SITE_OTHER): Payer: Medicare PPO | Admitting: Gastroenterology

## 2011-07-03 ENCOUNTER — Encounter: Payer: Self-pay | Admitting: Gastroenterology

## 2011-07-03 VITALS — BP 104/74 | HR 76 | Ht 70.5 in | Wt 228.0 lb

## 2011-07-03 DIAGNOSIS — R079 Chest pain, unspecified: Secondary | ICD-10-CM

## 2011-07-03 MED ORDER — HYOSCYAMINE SULFATE 0.125 MG PO TABS: 0.1250 mg | ORAL_TABLET | ORAL | Status: AC | PRN

## 2011-07-03 MED ORDER — PANTOPRAZOLE SODIUM 40 MG PO TBEC: 40.0000 mg | DELAYED_RELEASE_TABLET | Freq: Two times a day (BID) | ORAL | Status: DC

## 2011-07-03 NOTE — Patient Instructions (Signed)
Please make sure to take protonix twice daily instead of once daily.  We have sent a new perscription to your pharmacy to reflect this. We have also sent a prescription of Levsin to your pharmacy for you to take every four hours as needed.  We have given you a reflux diet to look over, please follow those instructions.  Please follow up with Dr. Jarold Motto in one (1) month.

## 2011-07-03 NOTE — Progress Notes (Signed)
This is a complicated 55 year old Caucasian male who is a heterozygote for hemachromatosis. 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 Recent upper abdominal ultrasound exam showed no evidence of cholelithiasis. He currently has had recurrent diffuse chest pain without real precipitating or alleviating elements. Recent cardiac evaluation apparently was unremarkable. He does have chronic acid reflux, and is on daily Protonix 40 mg, increased to twice a day yesterday by cardiology. The patient does have some associated palpitations and mild shortness of breath. He denies dysphagia or any specific hepatobiliary complaints at this time. He does use alcohol fairly regularly but denies alcoholism. His adult onset diabetes is well managed with metformin. The patient denies bowel irregularities, melena or hematochezia or systemic complaints. Review of recent lab shows no specific abnormalities. Holter monitor and exercise treadmill testing has been scheduled.  His chest pain is very atypical and is in the mid substernal area described as a" gurgling in my chest". Endoscopy in February was unremarkable except for hiatal hernia and mild duodenitis. Examination for H. pylori was negative. Colonoscopy also was unremarkable.    Current Medications, Allergies, Past Medical History, Past Surgical History, Family History and Social History were reviewed in [facility] record.  Pertinent Review of Systems Negative.. no cough, sputum production, fever, chills, or other specific cardiovascular or pulmonary complaints.   Physical Exam: Heavyset-appearing patient with a BMI of 32. I cannot appreciate stigmata of chronic liver disease. His chest is clear cardiac exam shows no murmurs gallops or rubs, and he appears to be in a regular rhythm. Cannot appreciate organomegaly, abdominal masses or localized tenderness. Bowel sounds are normal. Peripheral  extremities unremarkable mental status is normal.    Assessment and Plan: Fatty liver disease in a patient who is a heterozygote for hemachromatosis. Alpha-fetoprotein level is normal, and ultrasound exam shows no evidence of hepatoma. His chest pain is very atypical it is perhaps related to acid reflux. The patient has not been taking his PPI medication appropriately, and I changed him to Protonix 40 mg 30 minutes before meals twice a day. He is to take his WelChol 2 hours apart from other meds. I also prescribed when necessary sublingual Levsin 1.25 mg. I have urged him to continue his followup cardiac procedures as scheduled. Blood pressure today 104/74, and have urged him to take other medications as listed and reviewed his record. He may need to consider esophageal manometry and 24-hour pH probe testing in the future. Encounter Diagnosis  Name Primary?  . Chest pain, unspecified Yes

## 2011-07-04 ENCOUNTER — Other Ambulatory Visit: Payer: Self-pay | Admitting: Cardiology

## 2011-07-04 ENCOUNTER — Other Ambulatory Visit: Payer: Self-pay | Admitting: *Deleted

## 2011-07-04 DIAGNOSIS — R079 Chest pain, unspecified: Secondary | ICD-10-CM

## 2011-07-04 MED ORDER — LISINOPRIL 10 MG PO TABS: 10.0000 mg | ORAL_TABLET | Freq: Every day | ORAL | Status: DC

## 2011-07-04 MED ORDER — METOPROLOL SUCCINATE ER 100 MG PO TB24: ORAL_TABLET | ORAL | Status: DC

## 2011-07-04 MED ORDER — PANTOPRAZOLE SODIUM 40 MG PO TBEC: 40.0000 mg | DELAYED_RELEASE_TABLET | Freq: Two times a day (BID) | ORAL | Status: DC

## 2011-07-04 MED ORDER — METFORMIN HCL ER 500 MG PO TB24: 500.0000 mg | ORAL_TABLET | Freq: Two times a day (BID) | ORAL | Status: DC

## 2011-07-04 NOTE — Telephone Encounter (Signed)
R'cd fax from [facility]-pt is requesting refils for Metformin, Metoprolol, and Lisinopril

## 2011-07-08 ENCOUNTER — Encounter (INDEPENDENT_AMBULATORY_CARE_PROVIDER_SITE_OTHER): Payer: Medicare PPO

## 2011-07-08 DIAGNOSIS — R002 Palpitations: Secondary | ICD-10-CM

## 2011-07-11 NOTE — Telephone Encounter (Signed)
Error

## 2011-07-15 ENCOUNTER — Ambulatory Visit (INDEPENDENT_AMBULATORY_CARE_PROVIDER_SITE_OTHER): Payer: Medicare PPO | Admitting: Physician Assistant

## 2011-07-15 DIAGNOSIS — R079 Chest pain, unspecified: Secondary | ICD-10-CM

## 2011-07-15 NOTE — Progress Notes (Signed)
Exercise Treadmill Test  Pre-Exercise Testing Evaluation Rhythm: normal sinus  Rate: 83   PR:  .16 QRS:  .10  QT:  .39 QTc: .46     Test  Exercise Tolerance Test Ordering MD: Marca Ancona, MD  Interpreting MD:  Tereso Newcomer, PA-C  Unique Test No: 1  Treadmill:  1  Indication for ETT: chest pain - rule out ischemia  Contraindication to ETT: No   Stress Modality: exercise - treadmill  Cardiac Imaging Performed: non   Protocol: standard Bruce - maximal  Max BP: 157/83  Max MPHR (bpm):  166 85% MPR (bpm):  141  MPHR obtained (bpm):  142 % MPHR obtained:  85  Reached 85% MPHR (min:sec):  6:50 Total Exercise Time (min-sec):  7:00  Workload in METS:  15 Borg Scale: 8.4  Reason ETT Terminated:  patient's desire to stop    ST Segment Analysis At Rest: normal ST segments - no evidence of significant ST depression With Exercise: no evidence of significant ST depression  Other Information Arrhythmia:  Frequent PACs Angina during ETT:  absent (0) Quality of ETT:  diagnostic  ETT Interpretation:  normal - no evidence of ischemia by ST analysis  Comments: Fair exercise tolerance. No chest pain. Normal BP response to exercise. No ST-T changes to suggest ischemia.  Frequent PACs noted.  Recommendations: Follow up as directed.

## 2011-07-16 ENCOUNTER — Telehealth: Payer: Self-pay | Admitting: *Deleted

## 2011-07-16 NOTE — Telephone Encounter (Signed)
Dr Shirlee Latch reviewed monitor done 07/08/11 PACs, blocked PACs, short runs of probable atrial tachycardia, no change in therapy. I discussed results with pt. Pt states his chest pain is much better and was told it was probably GI related. He is requesting to cancel the appt with Scott scheduled for 07/30/11. Pt will call if recurrent symptoms.

## 2011-07-30 ENCOUNTER — Ambulatory Visit: Payer: Medicare PPO | Admitting: Physician Assistant

## 2011-08-01 ENCOUNTER — Ambulatory Visit (INDEPENDENT_AMBULATORY_CARE_PROVIDER_SITE_OTHER): Payer: Medicare PPO | Admitting: Gastroenterology

## 2011-08-01 ENCOUNTER — Encounter: Payer: Self-pay | Admitting: Gastroenterology

## 2011-08-01 VITALS — BP 116/82 | HR 64 | Ht 71.0 in | Wt 230.6 lb

## 2011-08-01 DIAGNOSIS — K7689 Other specified diseases of liver: Secondary | ICD-10-CM

## 2011-08-01 DIAGNOSIS — K5730 Diverticulosis of large intestine without perforation or abscess without bleeding: Secondary | ICD-10-CM

## 2011-08-01 DIAGNOSIS — R079 Chest pain, unspecified: Secondary | ICD-10-CM

## 2011-08-01 DIAGNOSIS — K219 Gastro-esophageal reflux disease without esophagitis: Secondary | ICD-10-CM

## 2011-08-01 DIAGNOSIS — K760 Fatty (change of) liver, not elsewhere classified: Secondary | ICD-10-CM | POA: Insufficient documentation

## 2011-08-01 MED ORDER — PANTOPRAZOLE SODIUM 40 MG PO TBEC: 40.0000 mg | DELAYED_RELEASE_TABLET | Freq: Every day | ORAL | Status: DC

## 2011-08-01 NOTE — Progress Notes (Signed)
History of Present Illness: This is a complicated 54 year old Caucasian male with coronary artery disease and possible cardiomyopathy. He is a compound heterozygote for hemachromatosis. Recent liver function tests, alpha-fetoprotein, and upper abdominal ultrasound exam were unremarkable except for some echo changes consistent with fatty infiltration of his liver. He does have type 2 diabetes and hyperlipidemia. The patient takes daily Protonix 40 mg 30 minutes twice a day for acid reflux. He currently is asymptomatic and denies gastrointestinal problems. Recent cardiac stress testing showed no evidence of significant ischemia. Recent alpha-fetoprotein level also was normal. Serum ferritin level has been under 100.    Current Medications, Allergies, Past Medical History, Past Surgical History, Family History and Social History were reviewed in [facility] record.   Assessment and plan: I do not think this patient needs phlebotomies unless his ferritin level exceed 100. He is a compound heterozygote and not a homozygote or hemachromatosis. I have asked to continue all current medications as listed and reviewed. He will stop his WelChol because of expense. I reviewed his labs he appears to have hypertriglyceridemia with normal cholesterol levels. I will defer to cardiology as to the need for WelChol versus Clofibrate. Diabetes appears to be under good control and he is up-to-date on his endoscopy and colonoscopy exams. I will see him in 6 months time with serum ferritin level before his visit. I have again advised him to take his PPI 30 minutes before the first meal of the day, think once a day medication should control his GERD.

## 2011-08-01 NOTE — Patient Instructions (Signed)
Return to the basement lab about a week before your return office visit in 6 months to have your Ferritin drawn. Stop by the front to schedule that before leaving. Decrease you Protonix to 1 every day and stop the Welchol.

## 2011-08-27 ENCOUNTER — Telehealth: Payer: Self-pay

## 2011-08-27 MED ORDER — LISINOPRIL 10 MG PO TABS: 10.0000 mg | ORAL_TABLET | Freq: Two times a day (BID) | ORAL | Status: DC

## 2011-08-27 NOTE — Telephone Encounter (Signed)
rx corrected and done per emr

## 2011-08-27 NOTE — Telephone Encounter (Signed)
Patient informed. 

## 2011-08-27 NOTE — Telephone Encounter (Signed)
Pt called stating that his Rx for Lisinopril has been changed to qd instead of bid as he was always taking. There is no documentation supporting decrease to qd, okay to change to bid and refill? [facility]  *Pt does not require a return call once Rx is updated*

## 2011-09-07 ENCOUNTER — Ambulatory Visit (INDEPENDENT_AMBULATORY_CARE_PROVIDER_SITE_OTHER): Payer: Medicare PPO

## 2011-09-07 DIAGNOSIS — J209 Acute bronchitis, unspecified: Secondary | ICD-10-CM

## 2011-10-31 ENCOUNTER — Other Ambulatory Visit (INDEPENDENT_AMBULATORY_CARE_PROVIDER_SITE_OTHER): Payer: Medicare PPO | Admitting: *Deleted

## 2011-10-31 LAB — HEPATIC FUNCTION PANEL
ALT: 54 U/L — ABNORMAL HIGH (ref 0–53)
Albumin: 4 g/dL (ref 3.5–5.2)
Alkaline Phosphatase: 56 U/L (ref 39–117)
Total Protein: 7.1 g/dL (ref 6.0–8.3)

## 2011-11-04 ENCOUNTER — Telehealth: Payer: Self-pay | Admitting: Cardiology

## 2011-11-04 NOTE — Telephone Encounter (Signed)
FU Call: Pt returning call to Anne. Please call back.  

## 2011-11-04 NOTE — Telephone Encounter (Signed)
Pt notified of recent lab results

## 2011-11-05 ENCOUNTER — Other Ambulatory Visit (INDEPENDENT_AMBULATORY_CARE_PROVIDER_SITE_OTHER): Payer: Medicare PPO

## 2011-11-05 DIAGNOSIS — Z0000 Encounter for general adult medical examination without abnormal findings: Secondary | ICD-10-CM

## 2011-11-05 DIAGNOSIS — Z125 Encounter for screening for malignant neoplasm of prostate: Secondary | ICD-10-CM

## 2011-11-05 DIAGNOSIS — E119 Type 2 diabetes mellitus without complications: Secondary | ICD-10-CM

## 2011-11-05 LAB — CBC WITH DIFFERENTIAL/PLATELET
Basophils Absolute: 0 10*3/uL (ref 0.0–0.1)
HCT: 46.3 % (ref 39.0–52.0)
Hemoglobin: 15.6 g/dL (ref 13.0–17.0)
Lymphs Abs: 2.3 10*3/uL (ref 0.7–4.0)
MCHC: 33.8 g/dL (ref 30.0–36.0)
MCV: 90.3 fl (ref 78.0–100.0)
Monocytes Absolute: 0.9 10*3/uL (ref 0.1–1.0)
Neutro Abs: 5 10*3/uL (ref 1.4–7.7)
Platelets: 192 10*3/uL (ref 150.0–400.0)
RDW: 14.5 % (ref 11.5–14.6)

## 2011-11-05 LAB — HEPATIC FUNCTION PANEL
AST: 51 U/L — ABNORMAL HIGH (ref 0–37)
Albumin: 4.4 g/dL (ref 3.5–5.2)
Alkaline Phosphatase: 60 U/L (ref 39–117)
Bilirubin, Direct: 0.1 mg/dL (ref 0.0–0.3)
Total Bilirubin: 0.8 mg/dL (ref 0.3–1.2)

## 2011-11-05 LAB — URINALYSIS, ROUTINE W REFLEX MICROSCOPIC
Leukocytes, UA: NEGATIVE
Specific Gravity, Urine: 1.025 (ref 1.000–1.030)
Urine Glucose: NEGATIVE
Urobilinogen, UA: 0.2 (ref 0.0–1.0)
pH: 5.5 (ref 5.0–8.0)

## 2011-11-05 LAB — TSH: TSH: 6.34 u[IU]/mL — ABNORMAL HIGH (ref 0.35–5.50)

## 2011-11-05 LAB — BASIC METABOLIC PANEL
Calcium: 9.7 mg/dL (ref 8.4–10.5)
GFR: 70.99 mL/min (ref >60.00)
Glucose, Bld: 95 mg/dL (ref 70–99)
Potassium: 4.3 mEq/L (ref 3.5–5.1)
Sodium: 139 mEq/L (ref 135–145)

## 2011-11-05 LAB — LIPID PANEL
HDL: 31.1 mg/dL — ABNORMAL LOW (ref >39.00)
Total CHOL/HDL Ratio: 7
Triglycerides: 328 mg/dL — ABNORMAL HIGH (ref 0.0–149.0)

## 2011-11-05 LAB — MICROALBUMIN / CREATININE URINE RATIO: Microalb Creat Ratio: 0.6 mg/g (ref 0.0–30.0)

## 2011-11-13 ENCOUNTER — Encounter: Payer: Self-pay | Admitting: Internal Medicine

## 2011-11-13 ENCOUNTER — Ambulatory Visit (INDEPENDENT_AMBULATORY_CARE_PROVIDER_SITE_OTHER): Payer: Medicare PPO | Admitting: Internal Medicine

## 2011-11-13 VITALS — BP 110/82 | HR 70 | Temp 98.6°F | Ht 70.5 in | Wt 232.5 lb

## 2011-11-13 DIAGNOSIS — R7989 Other specified abnormal findings of blood chemistry: Secondary | ICD-10-CM | POA: Insufficient documentation

## 2011-11-13 DIAGNOSIS — Z0000 Encounter for general adult medical examination without abnormal findings: Secondary | ICD-10-CM

## 2011-11-13 DIAGNOSIS — R6889 Other general symptoms and signs: Secondary | ICD-10-CM

## 2011-11-13 DIAGNOSIS — E119 Type 2 diabetes mellitus without complications: Secondary | ICD-10-CM

## 2011-11-13 DIAGNOSIS — E785 Hyperlipidemia, unspecified: Secondary | ICD-10-CM

## 2011-11-13 MED ORDER — EZETIMIBE 10 MG PO TABS: 10.0000 mg | ORAL_TABLET | Freq: Every day | ORAL | Status: DC

## 2011-11-13 MED ORDER — METFORMIN HCL ER 500 MG PO TB24: ORAL_TABLET | ORAL | Status: DC

## 2011-11-13 NOTE — Assessment & Plan Note (Addendum)
?   spurious - for f/u next visit Lab Results  Component Value Date   TSH 6.34* 11/05/2011

## 2011-11-13 NOTE — Patient Instructions (Signed)
Please increase the metformin to 3 pills per day (all ok in the AM) Start the zetia 10 mg per day for cholesterol Continue all other medications as before Please have the pharmacy call if you need refills Please keep your appointments with your specialists as you have planned - cardiology Please return in 6 mo with Lab testing done 3-5 days before

## 2011-11-16 ENCOUNTER — Encounter: Payer: Self-pay | Admitting: Internal Medicine

## 2011-11-16 NOTE — Assessment & Plan Note (Signed)

## 2011-11-16 NOTE — Assessment & Plan Note (Signed)
Uncontrolled, to add zetia 10 qd,  to f/u any worsening symptoms or concerns Lab Results  Component Value Date   LDLCALC 79 11/30/2008

## 2011-11-16 NOTE — Progress Notes (Signed)
Subjective:    Patient ID: Keith [name], male    DOB: Aug 02, 1956, 56 y.o.   MRN: [id number]  HPI  Here for wellness and f/u;  Overall doing ok;  Pt denies CP, worsening SOB, DOE, wheezing, orthopnea, PND, worsening LE edema, palpitations, dizziness or syncope.  Pt denies neurological change such as new Headache, facial or extremity weakness.  Pt denies polydipsia, polyuria, or low sugar symptoms. Pt states overall good compliance with treatment and medications, good tolerability, and trying to follow lower cholesterol diet.  Pt denies worsening depressive symptoms, suicidal ideation or panic. No fever, wt loss, night sweats, loss of appetite, or other constitutional symptoms.  Pt states good ability with ADL's, low fall risk, home safety reviewed and adequate, no significant changes in hearing or vision, and occasionally active with exercise.  No acute complaints Past Medical History  Diagnosis Date  . Hyperlipidemia   . Diabetes mellitus   . GERD (gastroesophageal reflux disease)   . Hypertension   . Cervical spine pain     Chronic  . History of colonic polyps   . Low back pain     The pt is S/P spinal fusion surgeries  . Hereditary hemochromatosis     Compound heterozygote. Pt gets periodic phlebotomies  . Anxiety   . Chest pain     Exercise/adenosine Myoview showed EF 50% (3/10). Inferior hypokinesis. Perfusion images, inferior scar and ischemia in the apical anterior septum and in teh inferior wall . Suggestive of a CD. LHC (3/10) showed only lu minal irregulatrities in the coronaries w/ EF 55%, suggesting that myoview was a false positive, likely from diaphragmatic attnuation.  . Multifocal atrial tachycardia     Wandering atrial pacemaker. Holter monior in 5/11 showed frequent runs of symtomatic MAT with heart rate around 100. Pt is now  on amiodarone to try to suppress MAT. PFTs (6/11): FVC 105%, FEV1 109%, TLC 112%, DLCO 87% (minimal obstructive effect). LFTs stable on amiodarone    . Cardiomyopathy     Echo (5/11) showed EF 30% with diffuse hypokinesis, normal wall thickness, mildly decreased RV systolic function. This may be a cardiomyopathy due to hemochromatosis versus tachy-mediated. Unable to get cardiac MRI due to claustrophobia.  . Peptic ulcer disease   . Fatty liver    Past Surgical History  Procedure Date  . Inguinal hernia repair   . Back surgery 1991    Lumbar  . Tonsillectomy     reports that he quit smoking about 8 years ago. He has never used smokeless tobacco. He reports that he drinks about .5 ounces of alcohol per week. He reports that he uses illicit drugs. family history includes Alcohol abuse in his father; Colon polyps in his mother; Diabetes in his mother, paternal grandfather, and paternal grandmother; Hypertension in his father; and Multiple sclerosis in his mother and sister.  There is no history of Colon cancer. Allergies  Allergen Reactions  . Metoclopramide Hcl   . Penicillins   . Sertraline Hcl    Current Outpatient Prescriptions on File Prior to Visit  Medication Sig Dispense Refill  . amiodarone (PACERONE) 200 MG tablet Take 100 mg by mouth daily.        Marland Kitchen aspirin 81 MG EC tablet Take 81 mg by mouth daily.        . bifidobacterium infantis (ALIGN) capsule Take 1 capsule by mouth daily.        . cetirizine (ZYRTEC) 10 MG tablet Take 10 mg by mouth as needed.        Marland Kitchen  diazepam (VALIUM) 5 MG tablet Take 1 tablet (5 mg total) by mouth every 8 (eight) hours as needed for anxiety or sleep.  30 tablet  0  . fluticasone (FLONASE) 50 MCG/ACT nasal spray Place 2 sprays into the nose as needed.        Marland Kitchen lisinopril (PRINIVIL,ZESTRIL) 10 MG tablet Take 1 tablet (10 mg total) by mouth 2 (two) times daily.  180 tablet  1  . metoprolol (TOPROL-XL) 100 MG 24 hr tablet TAKE ONE TABLET BY MOUTH TWICE DAILY  180 tablet  3  . pantoprazole (PROTONIX) 40 MG tablet Take 1 tablet (40 mg total) by mouth daily.  60 tablet  2   Review of Systems Review of  Systems  Constitutional: Negative for diaphoresis, activity change, appetite change and unexpected weight change.  HENT: Negative for hearing loss, ear pain, facial swelling, mouth sores and neck stiffness.   Eyes: Negative for pain, redness and visual disturbance.  Respiratory: Negative for shortness of breath and wheezing.   Cardiovascular: Negative for chest pain and palpitations.  Gastrointestinal: Negative for diarrhea, blood in stool, abdominal distention and rectal pain.  Genitourinary: Negative for hematuria, flank pain and decreased urine volume.  Musculoskeletal: Negative for myalgias and joint swelling.  Skin: Negative for color change and wound.  Neurological: Negative for syncope and numbness.  Hematological: Negative for adenopathy.  Psychiatric/Behavioral: Negative for hallucinations, self-injury, decreased concentration and agitation.      Objective:   Physical Exam BP 110/82  Pulse 70  Temp(Src) 98.6 F (37 C) (Oral)  Ht 5' 10.5" (1.791 m)  Wt 232 lb 8 oz (105.461 kg)  BMI 32.89 kg/m2  SpO2 96% Physical Exam  VS noted Constitutional: Pt is oriented to person, place, and time. Appears well-developed and well-nourished.  HENT:  Head: Normocephalic and atraumatic.  Right Ear: External ear normal.  Left Ear: External ear normal.  Nose: Nose normal.  Mouth/Throat: Oropharynx is clear and moist.  Eyes: Conjunctivae and EOM are normal. Pupils are equal, round, and reactive to light.  Neck: Normal range of motion. Neck supple. No JVD present. No tracheal deviation present.  Cardiovascular: Normal rate, regular rhythm, normal heart sounds and intact distal pulses.   Pulmonary/Chest: Effort normal and breath sounds normal.  Abdominal: Soft. Bowel sounds are normal. There is no tenderness.  Musculoskeletal: Normal range of motion. Exhibits no edema.  Lymphadenopathy:  Has no cervical adenopathy.  Neurological: Pt is alert and oriented to person, place, and time. Pt has  normal reflexes. No cranial nerve deficit.  Skin: Skin is warm and dry. No rash noted.  Psychiatric:  Has  normal mood and affect. Behavior is normal.     Assessment & Plan:

## 2011-11-16 NOTE — Assessment & Plan Note (Signed)
Uncontrolled, to incr the metformin to 3 tabs per day, cont diet, wt control,  to f/u any worsening symptoms or concerns  Lab Results  Component Value Date   HGBA1C 7.3* 11/05/2011

## 2011-12-27 ENCOUNTER — Other Ambulatory Visit: Payer: Self-pay | Admitting: Cardiology

## 2012-03-24 ENCOUNTER — Other Ambulatory Visit: Payer: Self-pay

## 2012-03-24 DIAGNOSIS — R079 Chest pain, unspecified: Secondary | ICD-10-CM

## 2012-03-24 MED ORDER — PANTOPRAZOLE SODIUM 40 MG PO TBEC: 40.0000 mg | DELAYED_RELEASE_TABLET | Freq: Every day | ORAL | Status: DC

## 2012-04-20 ENCOUNTER — Other Ambulatory Visit: Payer: Self-pay | Admitting: Internal Medicine

## 2012-04-21 ENCOUNTER — Other Ambulatory Visit: Payer: Self-pay

## 2012-04-21 MED ORDER — AMIODARONE HCL 100 MG PO TABS: 100.0000 mg | ORAL_TABLET | Freq: Every day | ORAL | Status: DC

## 2012-04-23 ENCOUNTER — Telehealth: Payer: Self-pay | Admitting: Internal Medicine

## 2012-04-23 NOTE — Telephone Encounter (Signed)
The pt called the triage line and is hoping to get refill of Pacerone for 200mg  (he stated he usually takes 200mg ). Sent to the [facility] in [place].   Thanks!

## 2012-04-23 NOTE — Telephone Encounter (Signed)
I dont normally refill this type of medication;  Would recommend calling ordering md or his cardiologist

## 2012-04-26 NOTE — Telephone Encounter (Signed)
Called the patient informed of MD instructions on medication. 

## 2012-05-10 ENCOUNTER — Other Ambulatory Visit (INDEPENDENT_AMBULATORY_CARE_PROVIDER_SITE_OTHER): Payer: Medicare PPO

## 2012-05-10 DIAGNOSIS — E119 Type 2 diabetes mellitus without complications: Secondary | ICD-10-CM

## 2012-05-10 DIAGNOSIS — R7989 Other specified abnormal findings of blood chemistry: Secondary | ICD-10-CM

## 2012-05-10 DIAGNOSIS — R6889 Other general symptoms and signs: Secondary | ICD-10-CM

## 2012-05-10 LAB — LIPID PANEL
Cholesterol: 206 mg/dL — ABNORMAL HIGH (ref 0–200)
HDL: 32.6 mg/dL — ABNORMAL LOW (ref >39.00)
Total CHOL/HDL Ratio: 6
Triglycerides: 293 mg/dL — ABNORMAL HIGH (ref 0.0–149.0)
VLDL: 58.6 mg/dL — ABNORMAL HIGH (ref 0.0–40.0)

## 2012-05-10 LAB — BASIC METABOLIC PANEL
BUN: 15 mg/dL (ref 6–23)
CO2: 26 mEq/L (ref 19–32)

## 2012-05-12 ENCOUNTER — Encounter: Payer: Self-pay | Admitting: Internal Medicine

## 2012-05-12 ENCOUNTER — Ambulatory Visit (INDEPENDENT_AMBULATORY_CARE_PROVIDER_SITE_OTHER): Payer: Medicare PPO | Admitting: Internal Medicine

## 2012-05-12 VITALS — BP 108/80 | HR 68 | Temp 97.0°F | Ht 70.5 in | Wt 229.0 lb

## 2012-05-12 DIAGNOSIS — I10 Essential (primary) hypertension: Secondary | ICD-10-CM

## 2012-05-12 DIAGNOSIS — E785 Hyperlipidemia, unspecified: Secondary | ICD-10-CM

## 2012-05-12 DIAGNOSIS — E119 Type 2 diabetes mellitus without complications: Secondary | ICD-10-CM

## 2012-05-12 DIAGNOSIS — E032 Hypothyroidism due to medicaments and other exogenous substances: Secondary | ICD-10-CM

## 2012-05-12 DIAGNOSIS — Z0000 Encounter for general adult medical examination without abnormal findings: Secondary | ICD-10-CM

## 2012-05-12 MED ORDER — LEVOTHYROXINE SODIUM 25 MCG PO TABS: 25.0000 ug | ORAL_TABLET | Freq: Every day | ORAL | Status: DC

## 2012-05-12 MED ORDER — ATORVASTATIN CALCIUM 10 MG PO TABS: 10.0000 mg | ORAL_TABLET | Freq: Every day | ORAL | Status: DC

## 2012-05-12 NOTE — Patient Instructions (Addendum)
Take all new medications as prescribed  - the low dose thyroid medication, and lipitor for cholesterol Continue all other medications as before Please have the pharmacy call with any refills you may need. Please continue your efforts at being more active, low cholesterol diet, and weight control. Please return in 6 mo with Lab testing done 3-5 days before

## 2012-05-12 NOTE — Assessment & Plan Note (Signed)
Mild uncontrolled, ideal goal ldl < 70 - for lipitor 10, f/u next visit

## 2012-05-12 NOTE — Assessment & Plan Note (Signed)
Very mild, plans to stay on amio for approx 1 more yr at least, for low dose synthroid, may be able to d/c eventually if able to come off the [facility]

## 2012-05-12 NOTE — Progress Notes (Signed)
Subjective:    Patient ID: Keith [name], male    DOB: 09-May-1956, 56 y.o.   MRN: [id number]  HPI   Here to f/u; overall doing ok,  Pt denies chest pain, increased sob or doe, wheezing, orthopnea, PND, increased LE swelling, palpitations, dizziness or syncope.  Pt denies new neurological symptoms such as new headache, or facial or extremity weakness or numbness   Pt denies polydipsia, polyuria, or low sugar symptoms such as weakness or confusion improved with po intake.  Pt states overall good compliance with meds, trying to follow lower cholesterol, diabetic diet, wt overall stable but little exercise however.  Never took the zetia due to cost.  The plan is for amiodarone use for at least 1 more yr.  Denies hyper or hypo thyroid symptoms such as voice, skin or hair change.   Past Medical History  Diagnosis Date  . Hyperlipidemia   . Diabetes mellitus   . GERD (gastroesophageal reflux disease)   . Hypertension   . Cervical spine pain     Chronic  . History of colonic polyps   . Low back pain     The pt is S/P spinal fusion surgeries  . Hereditary hemochromatosis     Compound heterozygote. Pt gets periodic phlebotomies  . Anxiety   . Chest pain     Exercise/adenosine Myoview showed EF 50% (3/10). Inferior hypokinesis. Perfusion images, inferior scar and ischemia in the apical anterior septum and in teh inferior wall . Suggestive of a CD. LHC (3/10) showed only lu minal irregulatrities in the coronaries w/ EF 55%, suggesting that myoview was a false positive, likely from diaphragmatic attnuation.  . Multifocal atrial tachycardia     Wandering atrial pacemaker. Holter monior in 5/11 showed frequent runs of symtomatic MAT with heart rate around 100. Pt is now  on amiodarone to try to suppress MAT. PFTs (6/11): FVC 105%, FEV1 109%, TLC 112%, DLCO 87% (minimal obstructive effect). LFTs stable on amiodarone  . Cardiomyopathy     Echo (5/11) showed EF 30% with diffuse hypokinesis, normal wall  thickness, mildly decreased RV systolic function. This may be a cardiomyopathy due to hemochromatosis versus tachy-mediated. Unable to get cardiac MRI due to claustrophobia.  . Peptic ulcer disease   . Fatty liver    Past Surgical History  Procedure Date  . Inguinal hernia repair   . Back surgery 1991    Lumbar  . Tonsillectomy     reports that he quit smoking about 8 years ago. He has never used smokeless tobacco. He reports that he drinks about .5 ounces of alcohol per week. He reports that he uses illicit drugs. family history includes Alcohol abuse in his father; Colon polyps in his mother; Diabetes in his mother, paternal grandfather, and paternal grandmother; Hypertension in his father; and Multiple sclerosis in his mother and sister.  There is no history of Colon cancer. Allergies  Allergen Reactions  . Metoclopramide Hcl   . Penicillins   . Sertraline Hcl    Current Outpatient Prescriptions on File Prior to Visit  Medication Sig Dispense Refill  . amiodarone (PACERONE) 100 MG tablet Take 1 tablet (100 mg total) by mouth daily.  30 tablet  1  . aspirin 81 MG EC tablet Take 81 mg by mouth daily.        . bifidobacterium infantis (ALIGN) capsule Take 1 capsule by mouth daily.        . cetirizine (ZYRTEC) 10 MG tablet Take 10 mg by mouth  as needed.        . fluticasone (FLONASE) 50 MCG/ACT nasal spray Place 2 sprays into the nose as needed.        Marland Kitchen lisinopril (PRINIVIL,ZESTRIL) 10 MG tablet TAKE 1 TABLET TWICE DAILY  180 tablet  1  . metFORMIN (GLUCOPHAGE-XR) 500 MG 24 hr tablet 3 tabs by mouth in the AM  270 tablet  3  . metoprolol (TOPROL-XL) 100 MG 24 hr tablet TAKE ONE TABLET BY MOUTH TWICE DAILY  180 tablet  3  . pantoprazole (PROTONIX) 40 MG tablet Take 1 tablet (40 mg total) by mouth daily.  30 tablet  7  . atorvastatin (LIPITOR) 10 MG tablet Take 1 tablet (10 mg total) by mouth daily.  90 tablet  3  . levothyroxine (SYNTHROID, LEVOTHROID) 25 MCG tablet Take 1 tablet (25  mcg total) by mouth daily.  90 tablet  3   Review of Systems  Constitutional: Negative for diaphoresis and unexpected weight change.  HENT: Negative for tinnitus.   Eyes: Negative for photophobia and visual disturbance.  Respiratory: Negative for cough   Gastrointestinal: Negative for vomiting and blood in stool.  Genitourinary: Negative for hematuria and decreased urine volume.  Musculoskeletal: Negative for gait problem.  Skin: Negative for color change and wound.  Neurological: Negative for tremors and numbness.       Objective:   Physical Exam BP 108/80  Pulse 68  Temp 97 F (36.1 C) (Oral)  Ht 5' 10.5" (1.791 m)  Wt 229 lb (103.874 kg)  BMI 32.39 kg/m2  SpO2 98% Physical Exam  VS noted, not ill appearing Constitutional: Pt appears well-developed and well-nourished.  HENT: Head: Normocephalic.  Right Ear: External ear normal.  Left Ear: External ear normal.  Eyes: Conjunctivae and EOM are normal. Pupils are equal, round, and reactive to light.  Neck: Normal range of motion. Neck supple.  Cardiovascular: Normal rate and regular rhythm.   Pulmonary/Chest: Effort normal and breath sounds normal.  Neurological: Pt is alert. Not confused Skin: Skin is warm. No erythema.  Psychiatric: Pt behavior is normal. Thought content normal.     Assessment & Plan:

## 2012-05-16 ENCOUNTER — Encounter: Payer: Self-pay | Admitting: Internal Medicine

## 2012-05-16 NOTE — Assessment & Plan Note (Signed)
stable overall by hx and exam, most recent data reviewed with pt, and pt to continue medical treatment as before BP Readings from Last 3 Encounters:  05/12/12 108/80  11/13/11 110/82  08/01/11 116/82

## 2012-05-16 NOTE — Assessment & Plan Note (Signed)
stable overall by hx and exam, most recent data reviewed with pt, and pt to continue medical treatment as before Lab Results  Component Value Date   HGBA1C 6.8* 05/10/2012

## 2012-06-09 ENCOUNTER — Encounter: Payer: Self-pay | Admitting: Gastroenterology

## 2012-07-05 ENCOUNTER — Other Ambulatory Visit: Payer: Self-pay

## 2012-07-05 MED ORDER — METOPROLOL SUCCINATE ER 100 MG PO TB24: ORAL_TABLET | ORAL | Status: DC

## 2012-08-08 IMAGING — US US ABDOMEN COMPLETE
1 series · 14 of 25 positions shown · non-contrast
Comparison: 01/02/2009

CLINICAL DATA: Hemochromatosis, hypertension, diabetes

ULTRASOUND ABDOMEN:
TECHNIQUE: Sonography of upper abdominal structures was performed.

[Series 1: us abdomen complete · 0.43mm/px · 14 of 109 slices shown]
[im 1/109]
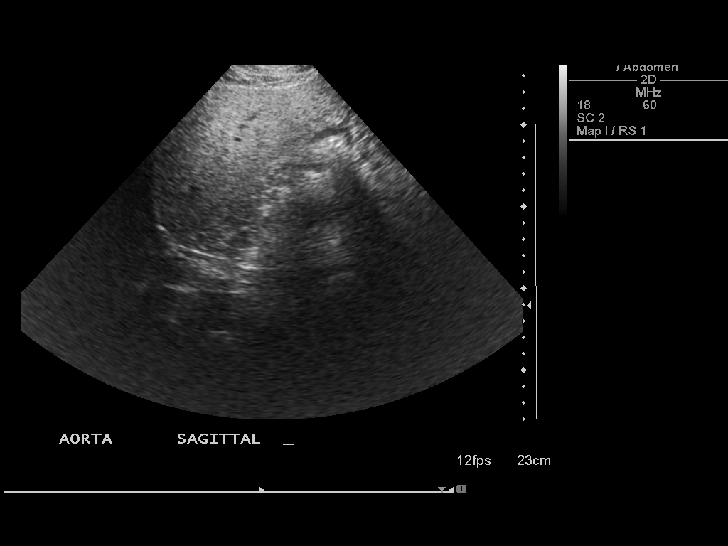
[im 10/109]
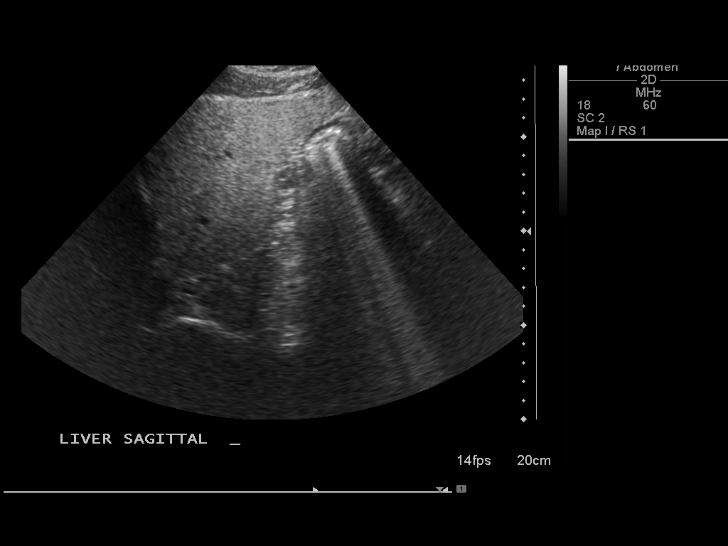
[im 19/109]
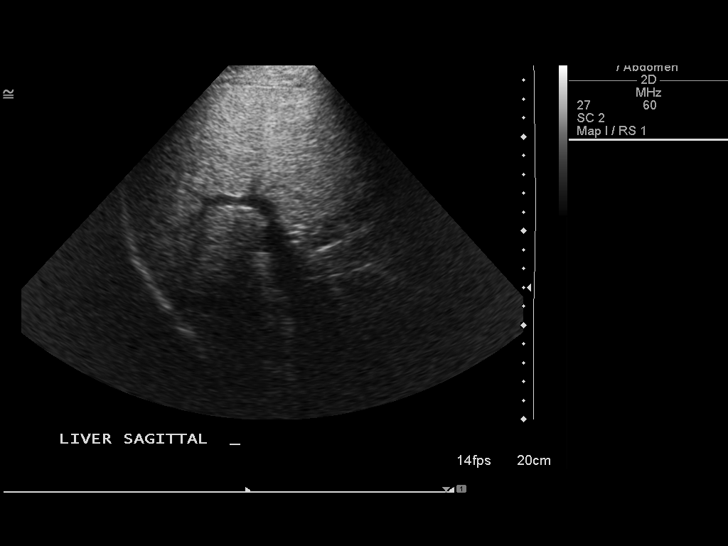
[im 28/109]
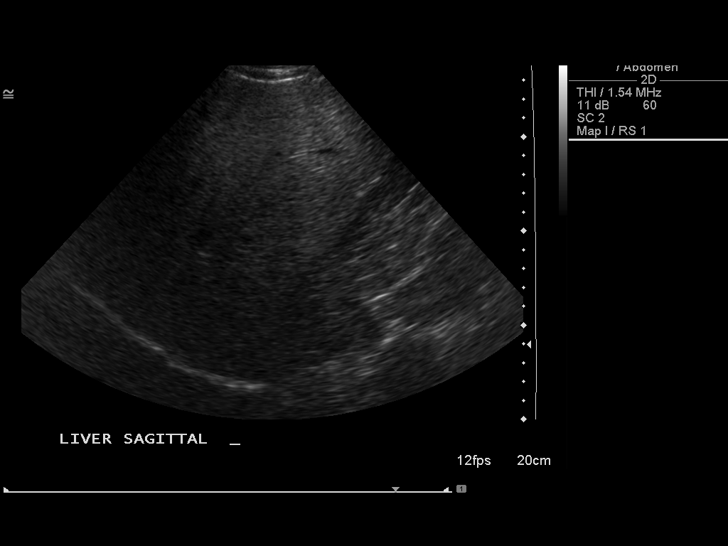
[im 37/109]
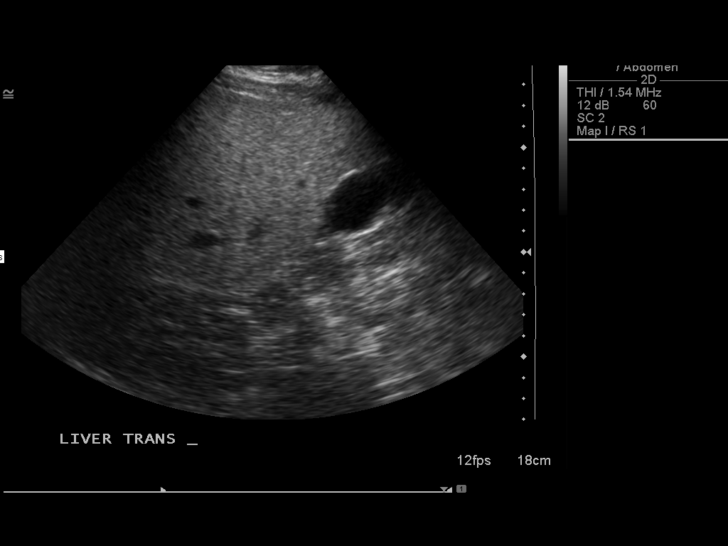
[im 41/109]
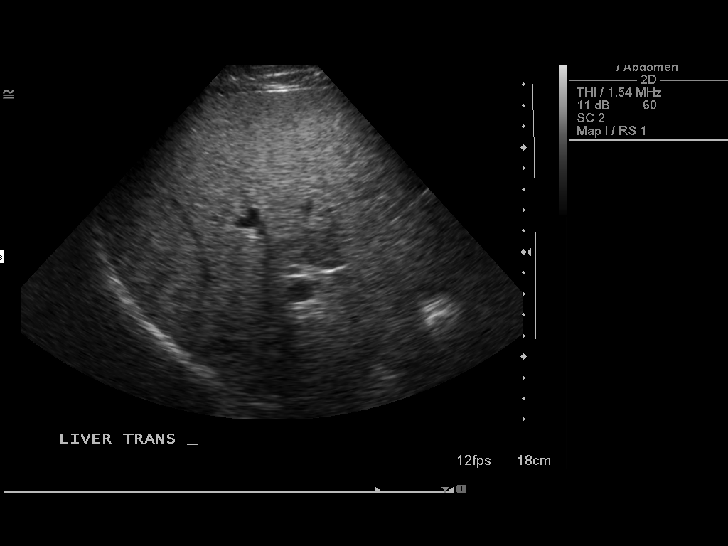
[im 50/109]
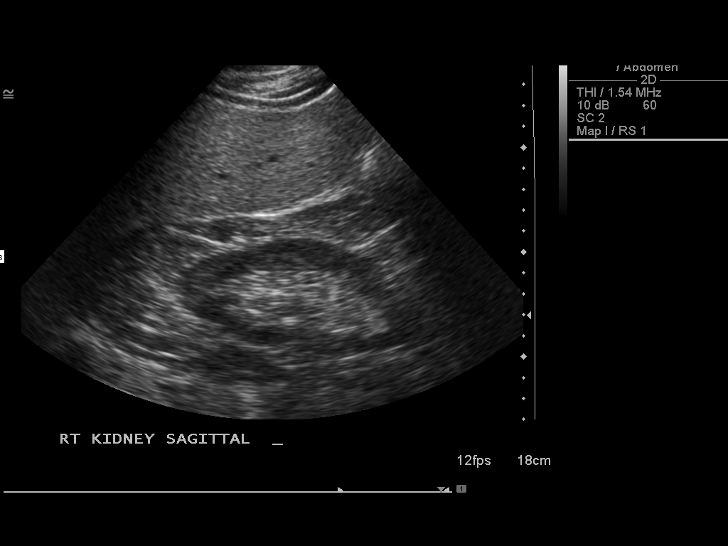
[im 59/109]
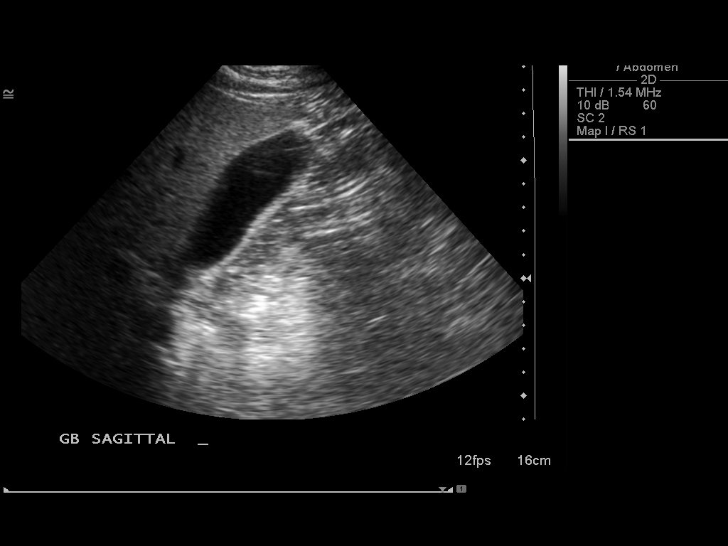
[im 68/109]
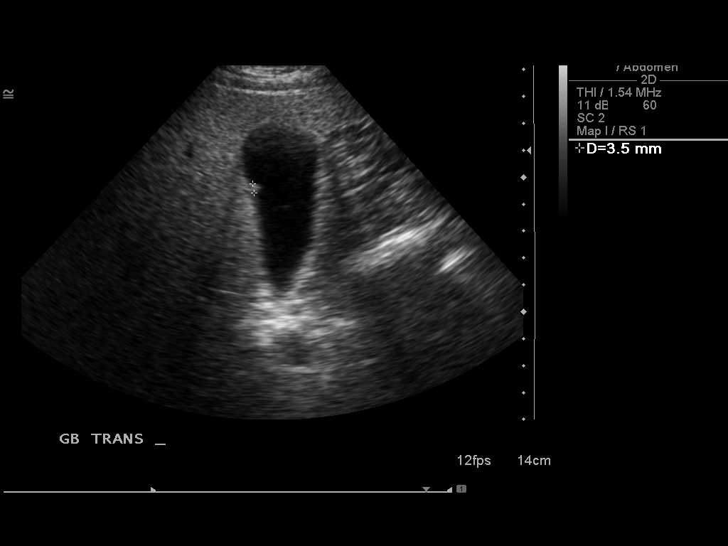
[im 73/109]
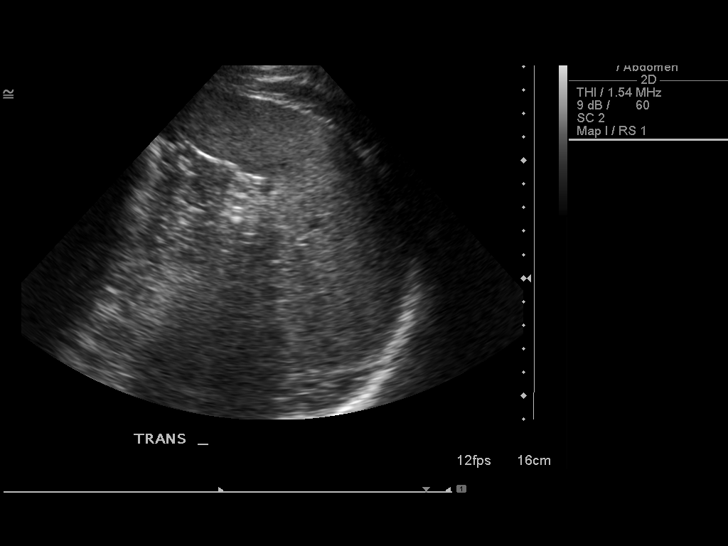
[im 82/109]
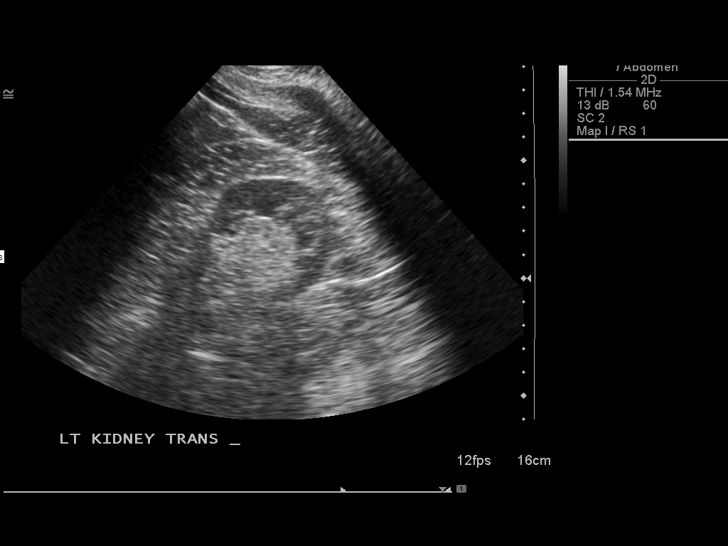
[im 91/109]
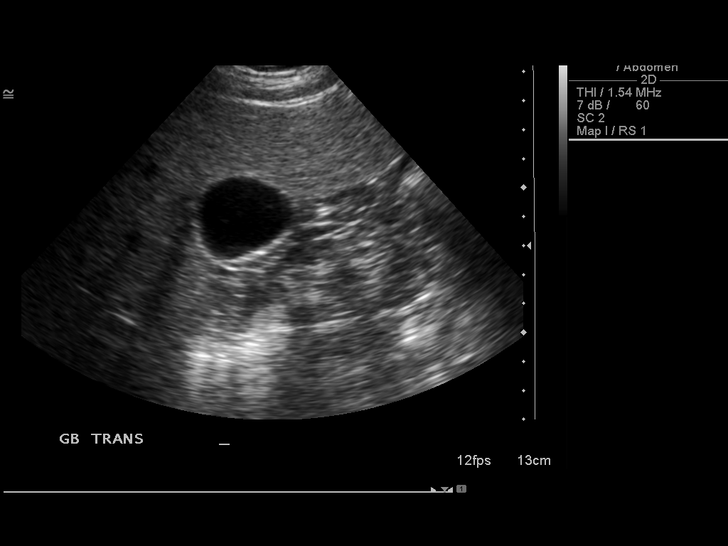
[im 100/109]
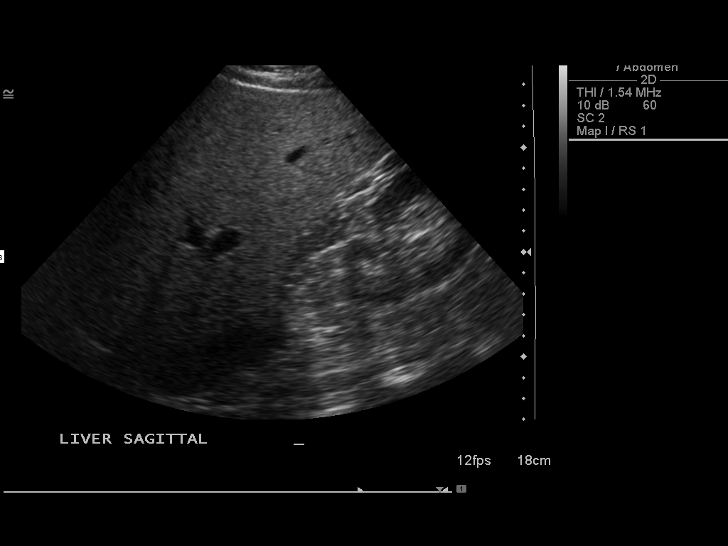
[im 109/109]
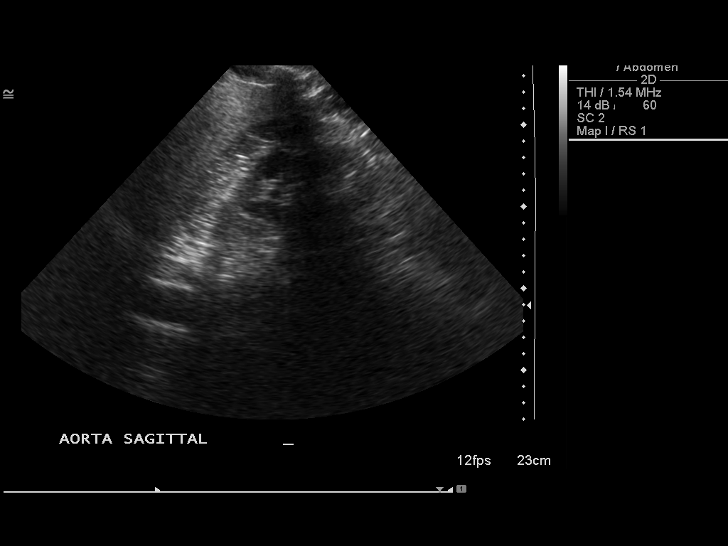

[14 of 25 positions shown; findings below may reference images not displayed]

Gallbladder:  4 mm polyp, seen previously.  No shadowing calculi,
wall thickening or pericholecystic fluid.  No sonographic Murphy's
sign.

Common bile duct:  Normal caliber 5 mm diameter

Liver:  Coarsened increased echogenicity the liver which could be
due to steatosis or cirrhosis.  No focal mass or nodularity.
Hepatopetal portal venous flow.

IVC:  Normal appearance

Pancreas:  Normal appearance

Spleen:  Normal appearance, 11.2 cm length

Right kidney:  12.3 cm length. Normal morphology without mass or
hydronephrosis.

Left kidney:  12.4 cm length. Normal morphology without mass or
hydronephrosis.

Aorta:  Proximal aorta normal caliber, with mid to distal abdominal
aorta obscured by bowel gas.

Other:  No free fluid
IMPRESSION: Coarsened significantly increased echogenicity of the liver which
could be due to steatosis or cirrhosis.
No definite focal hepatic mass.
4 mm gallbladder polyp.
Incomplete aortic visualization.

## 2012-09-16 ENCOUNTER — Other Ambulatory Visit: Payer: Self-pay | Admitting: Internal Medicine

## 2012-10-18 ENCOUNTER — Other Ambulatory Visit: Payer: Self-pay | Admitting: Internal Medicine

## 2012-11-08 ENCOUNTER — Other Ambulatory Visit (INDEPENDENT_AMBULATORY_CARE_PROVIDER_SITE_OTHER): Payer: Medicare Other

## 2012-11-08 DIAGNOSIS — Z0000 Encounter for general adult medical examination without abnormal findings: Secondary | ICD-10-CM

## 2012-11-08 DIAGNOSIS — E119 Type 2 diabetes mellitus without complications: Secondary | ICD-10-CM

## 2012-11-08 DIAGNOSIS — I10 Essential (primary) hypertension: Secondary | ICD-10-CM

## 2012-11-08 LAB — CBC WITH DIFFERENTIAL/PLATELET
Basophils Absolute: 0.1 10*3/uL (ref 0.0–0.1)
Eosinophils Relative: 0.1 % (ref 0.0–5.0)
HCT: 45.4 % (ref 39.0–52.0)
Hemoglobin: 15.5 g/dL (ref 13.0–17.0)
Lymphs Abs: 2.1 10*3/uL (ref 0.7–4.0)
MCV: 91 fl (ref 78.0–100.0)
Monocytes Absolute: 0.9 10*3/uL (ref 0.1–1.0)
Monocytes Relative: 7 % (ref 3.0–12.0)
Neutro Abs: 9.1 10*3/uL — ABNORMAL HIGH (ref 1.4–7.7)
RDW: 13.7 % (ref 11.5–14.6)

## 2012-11-08 LAB — BASIC METABOLIC PANEL
Calcium: 9.6 mg/dL (ref 8.4–10.5)
GFR: 72.19 mL/min (ref >60.00)
Glucose, Bld: 176 mg/dL — ABNORMAL HIGH (ref 70–99)
Potassium: 4.4 mEq/L (ref 3.5–5.1)
Sodium: 137 mEq/L (ref 135–145)

## 2012-11-08 LAB — HEPATIC FUNCTION PANEL
Albumin: 4 g/dL (ref 3.5–5.2)
Alkaline Phosphatase: 55 U/L (ref 39–117)
Total Bilirubin: 0.9 mg/dL (ref 0.3–1.2)

## 2012-11-08 LAB — LIPID PANEL
HDL: 33.5 mg/dL — ABNORMAL LOW (ref >39.00)
Total CHOL/HDL Ratio: 5
Triglycerides: 213 mg/dL — ABNORMAL HIGH (ref 0.0–149.0)
VLDL: 42.6 mg/dL — ABNORMAL HIGH (ref 0.0–40.0)

## 2012-11-08 LAB — URINALYSIS, ROUTINE W REFLEX MICROSCOPIC
Ketones, ur: NEGATIVE
Specific Gravity, Urine: >=1.03 (ref 1.000–1.030)
Urine Glucose: >=1000
pH: 6 (ref 5.0–8.0)

## 2012-11-08 LAB — PSA: PSA: 0.41 ng/mL (ref 0.10–4.00)

## 2012-11-08 LAB — LDL CHOLESTEROL, DIRECT: Direct LDL: 89.7 mg/dL

## 2012-11-08 LAB — MICROALBUMIN / CREATININE URINE RATIO: Microalb Creat Ratio: 0.6 mg/g (ref 0.0–30.0)

## 2012-11-09 ENCOUNTER — Other Ambulatory Visit: Payer: Self-pay

## 2012-11-09 MED ORDER — AMIODARONE HCL 100 MG PO TABS: 100.0000 mg | ORAL_TABLET | Freq: Every day | ORAL | Status: DC

## 2012-11-09 MED ORDER — LISINOPRIL 10 MG PO TABS: 10.0000 mg | ORAL_TABLET | Freq: Two times a day (BID) | ORAL | Status: DC

## 2012-11-09 MED ORDER — ATORVASTATIN CALCIUM 10 MG PO TABS: 10.0000 mg | ORAL_TABLET | Freq: Every day | ORAL | Status: DC

## 2012-11-09 MED ORDER — METFORMIN HCL ER 500 MG PO TB24: ORAL_TABLET | ORAL | Status: DC

## 2012-11-09 MED ORDER — METOPROLOL SUCCINATE ER 100 MG PO TB24: ORAL_TABLET | ORAL | Status: DC

## 2012-11-09 MED ORDER — PANTOPRAZOLE SODIUM 40 MG PO TBEC: 40.0000 mg | DELAYED_RELEASE_TABLET | Freq: Every day | ORAL | Status: DC

## 2012-11-12 ENCOUNTER — Encounter: Payer: Self-pay | Admitting: Internal Medicine

## 2012-11-12 ENCOUNTER — Ambulatory Visit (INDEPENDENT_AMBULATORY_CARE_PROVIDER_SITE_OTHER): Payer: Medicare Other | Admitting: Internal Medicine

## 2012-11-12 ENCOUNTER — Other Ambulatory Visit (INDEPENDENT_AMBULATORY_CARE_PROVIDER_SITE_OTHER): Payer: Medicare Other

## 2012-11-12 ENCOUNTER — Telehealth: Payer: Self-pay

## 2012-11-12 VITALS — BP 110/70 | HR 71 | Temp 97.5°F | Ht 70.0 in | Wt 230.0 lb

## 2012-11-12 DIAGNOSIS — I10 Essential (primary) hypertension: Secondary | ICD-10-CM

## 2012-11-12 LAB — IBC PANEL
Iron: 166 ug/dL — ABNORMAL HIGH (ref 42–165)
Saturation Ratios: 53.6 % — ABNORMAL HIGH (ref 20.0–50.0)
Transferrin: 221.3 mg/dL (ref 212.0–360.0)

## 2012-11-12 MED ORDER — DIAZEPAM 5 MG PO TABS: 5.0000 mg | ORAL_TABLET | Freq: Every day | ORAL | Status: DC | PRN

## 2012-11-12 MED ORDER — CETIRIZINE HCL 10 MG PO TABS: 10.0000 mg | ORAL_TABLET | ORAL | Status: DC | PRN

## 2012-11-12 MED ORDER — FLUTICASONE PROPIONATE 50 MCG/ACT NA SUSP: 2.0000 | NASAL | Status: DC | PRN

## 2012-11-12 MED ORDER — LEVOTHYROXINE SODIUM 25 MCG PO TABS: 25.0000 ug | ORAL_TABLET | Freq: Every day | ORAL | Status: DC

## 2012-11-12 MED ORDER — ATORVASTATIN CALCIUM 20 MG PO TABS: 20.0000 mg | ORAL_TABLET | Freq: Every day | ORAL | Status: DC

## 2012-11-12 NOTE — Patient Instructions (Addendum)
OK to increase the lipitor to 20 mg per day Your EKG was ok today Your blood work results are given today Please go to the LAB in the Basement (turn left off the elevator) for the tests to be done today You will be contacted by phone if any changes need to be made immediately.  Otherwise, you will receive a letter about your results with an explanation, but please check with MyChart first. Please continue your efforts at being more active, low cholesterol diet, and weight control. Please continue all other medications as before, and refills have been done if requested. Please have the pharmacy call with any other refills you may need. You are otherwise up to date with prevention measures today. Please remember to sign up for My Chart if you have not done so, as this will be important to you in the future with finding out test results, communicating by private email, and scheduling acute appointments online when needed. Please return in 6 months, or sooner if needed, with Lab testing done 3-5 days before

## 2012-11-12 NOTE — Telephone Encounter (Signed)
refill 

## 2012-11-12 NOTE — Assessment & Plan Note (Signed)

## 2012-11-12 NOTE — Progress Notes (Signed)
Subjective:    Patient ID: Keith [name], male    DOB: 01/30/1956, 57 y.o.   MRN: [id number]  HPI  Here for wellness and f/u;  Overall doing ok;  Pt denies CP, worsening SOB, DOE, wheezing, orthopnea, PND, worsening LE edema, palpitations, dizziness or syncope.  Pt denies neurological change such as new headache, facial or extremity weakness.  Pt denies polydipsia, polyuria, or low sugar symptoms. Pt states overall good compliance with treatment and medications, good tolerability, and has been trying to follow lower cholesterol diet.  Pt denies worsening depressive symptoms, suicidal ideation or panic. No fever, night sweats, wt loss, loss of appetite, or other constitutional symptoms.  Pt states good ability with ADL's, has low fall risk, home safety reviewed and adequate, no other significant changes in hearing or vision, and only very occasionally active with exercise, in fact has been less active recently to account for the elevated a1c.  Had steroid wk last wk after cortisone per ortho/dr paul to trigger fingers. Also tx with mobic for neck pain.  Past Medical History  Diagnosis Date  . Hyperlipidemia   . Diabetes mellitus   . GERD (gastroesophageal reflux disease)   . Hypertension   . Cervical spine pain     Chronic  . History of colonic polyps   . Low back pain     The pt is S/P spinal fusion surgeries  . Hereditary hemochromatosis     Compound heterozygote. Pt gets periodic phlebotomies  . Anxiety   . Chest pain     Exercise/adenosine Myoview showed EF 50% (3/10). Inferior hypokinesis. Perfusion images, inferior scar and ischemia in the apical anterior septum and in teh inferior wall . Suggestive of a CD. LHC (3/10) showed only lu minal irregulatrities in the coronaries w/ EF 55%, suggesting that myoview was a false positive, likely from diaphragmatic attnuation.  . Multifocal atrial tachycardia     Wandering atrial pacemaker. Holter monior in 5/11 showed frequent runs of symtomatic  MAT with heart rate around 100. Pt is now  on amiodarone to try to suppress MAT. PFTs (6/11): FVC 105%, FEV1 109%, TLC 112%, DLCO 87% (minimal obstructive effect). LFTs stable on amiodarone  . Cardiomyopathy     Echo (5/11) showed EF 30% with diffuse hypokinesis, normal wall thickness, mildly decreased RV systolic function. This may be a cardiomyopathy due to hemochromatosis versus tachy-mediated. Unable to get cardiac MRI due to claustrophobia.  . Peptic ulcer disease   . Fatty liver    Past Surgical History  Procedure Laterality Date  . Inguinal hernia repair    . Back surgery  1991    Lumbar  . Tonsillectomy      reports that he quit smoking about 9 years ago. He has never used smokeless tobacco. He reports that he drinks about 0.5 ounces of alcohol per week. He reports that he uses illicit drugs. family history includes Alcohol abuse in his father; Colon polyps in his mother; Diabetes in his mother, paternal grandfather, and paternal grandmother; Hypertension in his father; and Multiple sclerosis in his mother and sister.  There is no history of Colon cancer. Allergies  Allergen Reactions  . Metoclopramide Hcl   . Penicillins   . Sertraline Hcl    Current Outpatient Prescriptions on File Prior to Visit  Medication Sig Dispense Refill  . amiodarone (PACERONE) 100 MG tablet Take 1 tablet (100 mg total) by mouth daily.  90 tablet  2  . aspirin 81 MG EC tablet Take 81  mg by mouth daily.        Marland Kitchen levothyroxine (SYNTHROID, LEVOTHROID) 25 MCG tablet Take 1 tablet (25 mcg total) by mouth daily.  90 tablet  3  . lisinopril (PRINIVIL,ZESTRIL) 10 MG tablet Take 1 tablet (10 mg total) by mouth 2 (two) times daily.  180 tablet  2  . metFORMIN (GLUCOPHAGE-XR) 500 MG 24 hr tablet 3 tabs by mouth in the AM  270 tablet  2  . metoprolol succinate (TOPROL-XL) 100 MG 24 hr tablet TAKE ONE TABLET BY MOUTH TWICE DAILY  180 tablet  2  . pantoprazole (PROTONIX) 40 MG tablet Take 1 tablet (40 mg total) by  mouth daily.  90 tablet  2   No current facility-administered medications on file prior to visit.   Review of Systems Constitutional: Negative for diaphoresis, activity change, appetite change or unexpected weight change.  HENT: Negative for hearing loss, ear pain, facial swelling, mouth sores and neck stiffness.   Eyes: Negative for pain, redness and visual disturbance.  Respiratory: Negative for shortness of breath and wheezing.   Cardiovascular: Negative for chest pain and palpitations.  Gastrointestinal: Negative for diarrhea, blood in stool, abdominal distention or other pain Genitourinary: Negative for hematuria, flank pain or change in urine volume.  Musculoskeletal: Negative for myalgias and joint swelling.  Skin: Negative for color change and wound.  Neurological: Negative for syncope and numbness. other than noted Hematological: Negative for adenopathy.  Psychiatric/Behavioral: Negative for hallucinations, self-injury, decreased concentration and agitation.      Objective:   Physical Exam BP 110/70  Pulse 71  Temp(Src) 97.5 F (36.4 C) (Oral)  Ht 5\' 10"  (1.778 m)  Wt 230 lb (104.327 kg)  BMI 33 kg/m2  SpO2 97% VS noted,  Constitutional: Pt is oriented to person, place, and time. Appears well-developed and well-nourished.  Head: Normocephalic and atraumatic.  Right Ear: External ear normal.  Left Ear: External ear normal.  Nose: Nose normal.  Mouth/Throat: Oropharynx is clear and moist.  Eyes: Conjunctivae and EOM are normal. Pupils are equal, round, and reactive to light.  Neck: Normal range of motion. Neck supple. No JVD present. No tracheal deviation present.  Cardiovascular: Normal rate, regular rhythm, normal heart sounds and intact distal pulses.   Pulmonary/Chest: Effort normal and breath sounds normal.  Abdominal: Soft. Bowel sounds are normal. There is no tenderness. No HSM  Musculoskeletal: Normal range of motion. Exhibits no edema.  Lymphadenopathy:  Has  no cervical adenopathy.  Neurological: Pt is alert and oriented to person, place, and time. Pt has normal reflexes. No cranial nerve deficit.  Skin: Skin is warm and dry. No rash noted.  Psychiatric:  Has  normal mood and affect. Behavior is normal.     Assessment & Plan:

## 2012-11-12 NOTE — Assessment & Plan Note (Signed)
stable overall by history and exam, recent data reviewed with pt, and pt to continue medical treatment as before,  to f/u any worsening symptoms or concerns BP Readings from Last 3 Encounters:  11/12/12 110/70  05/12/12 108/80  11/13/11 110/82

## 2013-05-02 ENCOUNTER — Ambulatory Visit (INDEPENDENT_AMBULATORY_CARE_PROVIDER_SITE_OTHER): Payer: Medicare Other | Admitting: Cardiology

## 2013-05-02 ENCOUNTER — Encounter: Payer: Self-pay | Admitting: Cardiology

## 2013-05-02 VITALS — BP 128/80 | HR 74 | Ht 70.0 in | Wt 227.0 lb

## 2013-05-02 DIAGNOSIS — R000 Tachycardia, unspecified: Secondary | ICD-10-CM

## 2013-05-02 DIAGNOSIS — E785 Hyperlipidemia, unspecified: Secondary | ICD-10-CM

## 2013-05-02 DIAGNOSIS — R002 Palpitations: Secondary | ICD-10-CM

## 2013-05-02 DIAGNOSIS — I5022 Chronic systolic (congestive) heart failure: Secondary | ICD-10-CM

## 2013-05-02 LAB — HEPATIC FUNCTION PANEL
ALT: 45 U/L (ref 0–53)
AST: 28 U/L (ref 0–37)
Albumin: 4.1 g/dL (ref 3.5–5.2)
Alkaline Phosphatase: 54 U/L (ref 39–117)
Total Protein: 7.5 g/dL (ref 6.0–8.3)

## 2013-05-02 LAB — LIPID PANEL
Cholesterol: 180 mg/dL (ref 0–200)
Triglycerides: 342 mg/dL — ABNORMAL HIGH (ref 0.0–149.0)

## 2013-05-02 LAB — LDL CHOLESTEROL, DIRECT: Direct LDL: 106 mg/dL

## 2013-05-02 NOTE — Progress Notes (Deleted)
Patient ID: Keith [name], male   DOB: 1956-06-24, 57 y.o.   MRN: [id number]

## 2013-05-02 NOTE — Progress Notes (Signed)
Patient ID: Keith [name], male   DOB: 1956-02-13, 57 y.o.   MRN: [id number] PCP: Dr. Jonny Ruiz  57 yo with h/o DM2, hyperlipidemia, left heart cath in 3/10 showing mild luminal irregularities, hereditary hemochromatosis with periodic phlebotomy treatment, and atrial tachycardia presents for followup.  He has been noted to have a nonischemic cardiomyopathy with EF as low as 30% thought to be related to hemochromatosis.  Atrial tachycardia was symptomatic and has been suppressed by amiodarone and Toprol XL.  Patient had repeat echo on medical treatment of cardiomyopathy in 5/12.  This showed EF 55-60% (improved to normal range).   He had an ETT in 10/12 with no evidence of ischemia (atypical chest pain).    I have not seen him in a couple of years.  He is doing well overall.  No tachypalpitations.  No syncope/presyncope.  No exertional dyspnea.  He has occasional GERD after meals but no worrisome chest pain.  He is no longer on atorvastatin due to severe myalgias/weakness.  He thinks he did ok on pravastatin in the past.   Labs (5/11): BNP 21 Labs (6/11): TSH normal, AST 43, ALT 61 Labs (8/11): K 5.3, creatinine 1.2, TGs 233, HDL 69, LDL 134 Labs (9/11): K 4.5, creatinine 1.2, LFTs normal, TSH normal, LDL 104, HDL 30 Labs (2/12): K 4.6, creatinine 1.2, AST 48, ALT 62, LDL 99, HDL 26, TSH normal  Labs (2/95): K 4.4, creatinine 1.1, LFTs normal, TSH normal, HDL 33, LDL 88  ECG: NSR, normal  Allergies (verified):  1)  ! Pcn 2)  ! Zoloft 3)  ! Reglan  Past Medical History: 1. Hyperlipidemia. 2. Type 2 diabetes. 3. Gastroesophageal reflux disease. 4. Hypertension. 5. Chronic C-spine pain. 6. Colonic polyps. 7. Low back pain.  The patient is status post spinal fusion surgeries. 8. Hereditary hemochromatosis: Compound heterozygote.  Patient gets periodic phlebotomies 10. Anxiety. 11. Chest pain:  Exercise/adenosine Myoview showed EF of 50% (3/10).  There was inferior hypokinesis.  On perfusion  images, there was inferior scar and ischemia in the apical anterior septum and in the inferior wall.  This is suggestive of a coronary disease. LHC (3/10) showed only luminal irregularities in the coronaries with EF 55%, suggesting that myoview was a false positive, likely from diaphragmatic attenuation.   12. Multifocal atrial tachycardia/wandering atrial pacemaker.  Holter monitor in 5/11 showed frequent runs of symptomatic MAT with heart rate around 100.  Patient is now on amiodarone to try to suppress MAT.  PFTs (6/11): FVC 105%, FEV1 109%, TLC 112%, DLCO 87% (minimal obstructive defect).  LFTs stable on amiodarone.  Holter (11/12) with PACs, blocked PACs, short runs of atrial tachycardia 13. Cardiomyopathy: echo (5/11) showed EF 30% with diffuse hypokinesis, normal wall thickness, mildly decreased RV systolic function.  This may be a cardiomyopathy due to hemochromatosis versus tachy-mediated.  Unable to get cardiac MRI due to claustrophobia.  Echo (5/12): EF 55-60%, mild LAE.  14. Hypothyroidism  Family History: mother with colon polyps, MS Father with HTN, ETOH sister with MS No FH of Colon Cancer: Family History of Diabetes: Mother, PGM, PGF  Social History: disabled with back pain Married, lives in [place].  Former Smoker-quit around 2004 Alcohol use-yes-3-4 drinks per month 3 children Drug Use - yes - quit in 1980 Daily Caffeine Use-3 drinks daily Patient does not get regular exercise.   ROS: All systems reviewed and negative except as per HPI.   Current Outpatient Prescriptions  Medication Sig Dispense Refill  . aspirin 81  MG EC tablet Take 81 mg by mouth daily.        Marland Kitchen levothyroxine (SYNTHROID, LEVOTHROID) 25 MCG tablet Take 1 tablet (25 mcg total) by mouth daily.  90 tablet  3  . lisinopril (PRINIVIL,ZESTRIL) 10 MG tablet Take 1 tablet (10 mg total) by mouth 2 (two) times daily.  180 tablet  2  . metFORMIN (GLUCOPHAGE-XR) 500 MG 24 hr tablet 3 tabs by mouth in the AM   270 tablet  2  . metoprolol succinate (TOPROL-XL) 100 MG 24 hr tablet TAKE ONE TABLET BY MOUTH TWICE DAILY  180 tablet  2  . pantoprazole (PROTONIX) 40 MG tablet Take 1 tablet (40 mg total) by mouth daily.  90 tablet  2  . cetirizine (ZYRTEC) 10 MG tablet Take 1 tablet (10 mg total) by mouth as needed.  90 tablet  3  . diazepam (VALIUM) 5 MG tablet Take 1 tablet (5 mg total) by mouth daily as needed for anxiety.  30 tablet  0  . fluticasone (FLONASE) 50 MCG/ACT nasal spray Place 2 sprays into the nose as needed.  16 g  5   No current facility-administered medications for this visit.    BP 128/80  Pulse 74  Ht 5\' 10"  (1.778 m)  Wt 102.967 kg (227 lb)  BMI 32.57 kg/m2 General:  Well developed, well nourished, in no acute distress. Neck:  Neck supple, no JVD. No masses, thyromegaly or abnormal cervical nodes. Lungs:  Clear bilaterally to auscultation and percussion. Heart:  Non-displaced PMI, chest non-tender; regular rate and rhythm, S1, S2 without murmurs, rubs or gallops. Carotid upstroke normal, no bruit.  Pedals normal pulses. No edema, no varicosities. Abdomen:  Bowel sounds positive; abdomen soft and non-tender without masses, organomegaly, or hernias noted. No hepatosplenomegaly. Extremities:  No clubbing or cyanosis. Neurologic:  Alert and oriented x 3. Psych:  Normal affect.  Assessment/Plan: 1. Rhythm: Patient has had multifocal atrial tachycardia and wandering atrial pacemaker noted by holter and ECG. Hemochromatosis with infiltration of the atria may be the cause of this arrhythmia. No atrial fibrillation noted so far. He had a significant burden of MAT, so I put him on amiodarone and Toprol XL. This has resolved his palpitations.  He has been on this combination for a couple of years now.  I am going to try him off amiodarone and will continue Toprol XL at current dose.  I will see him back in 4 months to see how he does off amiodarone.  If symptoms become significant, he will  call prior to this.  2. Cardiomyopathy: EF as low as 30% in the past with luminal irregularities in the coronaries. Cardiomyopathy was thought to be due to hemochromatosis versus tachycardia-mediated cardiomyopathy from frequent MAT. He has been treated medically with Toprol XL and lisinopril, and EF was improved to 55-60% on last echo in 5/12. 3. Hyperlipidemia: He is not on statin at this point (myalgias with atorvastatin).  I will check lipids/LFTs today, if LDL is high, can consider pravastatin.   Marca Ancona 05/02/2013

## 2013-05-02 NOTE — Patient Instructions (Addendum)
Stop amiodarone.  Your physician recommends that you have a FASTING lipid profile /liver profile today.   Your physician recommends that you schedule a follow-up appointment in: 4 months with Dr Shirlee Latch.

## 2013-05-12 ENCOUNTER — Ambulatory Visit (INDEPENDENT_AMBULATORY_CARE_PROVIDER_SITE_OTHER): Payer: Medicare Other

## 2013-05-12 ENCOUNTER — Ambulatory Visit (INDEPENDENT_AMBULATORY_CARE_PROVIDER_SITE_OTHER): Payer: Medicare Other | Admitting: Internal Medicine

## 2013-05-12 ENCOUNTER — Encounter: Payer: Self-pay | Admitting: Internal Medicine

## 2013-05-12 VITALS — BP 122/80 | HR 64 | Temp 97.9°F | Ht 70.5 in | Wt 228.2 lb

## 2013-05-12 DIAGNOSIS — E785 Hyperlipidemia, unspecified: Secondary | ICD-10-CM

## 2013-05-12 DIAGNOSIS — Z0000 Encounter for general adult medical examination without abnormal findings: Secondary | ICD-10-CM

## 2013-05-12 DIAGNOSIS — I10 Essential (primary) hypertension: Secondary | ICD-10-CM

## 2013-05-12 DIAGNOSIS — R7302 Impaired glucose tolerance (oral): Secondary | ICD-10-CM

## 2013-05-12 DIAGNOSIS — E119 Type 2 diabetes mellitus without complications: Secondary | ICD-10-CM

## 2013-05-12 DIAGNOSIS — E041 Nontoxic single thyroid nodule: Secondary | ICD-10-CM

## 2013-05-12 DIAGNOSIS — Z23 Encounter for immunization: Secondary | ICD-10-CM

## 2013-05-12 LAB — CBC WITH DIFFERENTIAL/PLATELET
Basophils Relative: 0.6 % (ref 0.0–3.0)
Eosinophils Absolute: 0.2 10*3/uL (ref 0.0–0.7)
HCT: 42.3 % (ref 39.0–52.0)
Hemoglobin: 14.5 g/dL (ref 13.0–17.0)
Lymphocytes Relative: 31.6 % (ref 12.0–46.0)
Lymphs Abs: 2.2 10*3/uL (ref 0.7–4.0)
MCHC: 34.2 g/dL (ref 30.0–36.0)
MCV: 90.7 fl (ref 78.0–100.0)
Neutro Abs: 3.8 10*3/uL (ref 1.4–7.7)
RBC: 4.66 Mil/uL (ref 4.22–5.81)
RDW: 13.9 % (ref 11.5–14.6)

## 2013-05-12 MED ORDER — PRAVASTATIN SODIUM 20 MG PO TABS: 20.0000 mg | ORAL_TABLET | Freq: Every day | ORAL | Status: DC

## 2013-05-12 NOTE — Assessment & Plan Note (Signed)
?   Clinical signficance, but is new, for thyroid u/s

## 2013-05-12 NOTE — Assessment & Plan Note (Signed)
Not charged, but for pneumonia shot, and flu shot today

## 2013-05-12 NOTE — Patient Instructions (Addendum)
We will need to let you know about your test results that were already drawn today; You will be contacted by phone if any changes need to be made immediately.  Otherwise, you will receive a letter about your results with an explanation, but please check with MyChart first.  Please remember to sign up for My Chart if you have not done so, as this will be important to you in the future with finding out test results, communicating by private email, and scheduling acute appointments online when needed.  Please take all new medication as prescribed - the lower dose pravastatin (remember this one goes through the kidney, not the liver)  You had the flu shot, and pneumonia shot today The shingles shot is most often done at 57yo  Please continue all other medications as before, and refills have been done if requested. Please have the pharmacy call with any other refills you may need.  You will be contacted regarding the referral for: thyroid ultrasound  Please return in 6 months, or sooner if needed, with Lab testing done 3-5 days before

## 2013-05-12 NOTE — Progress Notes (Signed)
Subjective:    Patient ID: Keith [name], male    DOB: March 30, 1956, 57 y.o.   MRN: [id number]  HPI  Here to f/u; overall doing ok,  Pt denies chest pain, increased sob or doe, wheezing, orthopnea, PND, increased LE swelling, palpitations, dizziness or syncope.  Pt denies polydipsia, polyuria, or low sugar symptoms such as weakness or confusion improved with po intake.  Pt denies new neurological symptoms such as new headache, or facial or extremity weakness or numbness.   Pt states overall good compliance with meds, has been trying to follow lower cholesterol, diabetic diet, with wt overall stable,  but little exercise however.  Has labs done this am, not yet ready for review at time of this visit.  I Willing to take lower dose pravastatin, could not tolerate lipitor due to myalgias. Has not noticed any enlargement of the thyroid recently. No neck pain. Denies hyper or hypo thyroid symptoms such as voice, skin or hair change.  Overall good compliance with treatment, and good medicine tolerability. Due for flu and pneumonia shots Past Medical History  Diagnosis Date  . Hyperlipidemia   . Diabetes mellitus   . GERD (gastroesophageal reflux disease)   . Hypertension   . Cervical spine pain     Chronic  . History of colonic polyps   . Low back pain     The pt is S/P spinal fusion surgeries  . Hereditary hemochromatosis     Compound heterozygote. Pt gets periodic phlebotomies  . Anxiety   . Chest pain     Exercise/adenosine Myoview showed EF 50% (3/10). Inferior hypokinesis. Perfusion images, inferior scar and ischemia in the apical anterior septum and in teh inferior wall . Suggestive of a CD. LHC (3/10) showed only lu minal irregulatrities in the coronaries w/ EF 55%, suggesting that myoview was a false positive, likely from diaphragmatic attnuation.  . Multifocal atrial tachycardia     Wandering atrial pacemaker. Holter monior in 5/11 showed frequent runs of symtomatic MAT with heart rate  around 100. Pt is now  on amiodarone to try to suppress MAT. PFTs (6/11): FVC 105%, FEV1 109%, TLC 112%, DLCO 87% (minimal obstructive effect). LFTs stable on amiodarone  . Cardiomyopathy     Echo (5/11) showed EF 30% with diffuse hypokinesis, normal wall thickness, mildly decreased RV systolic function. This may be a cardiomyopathy due to hemochromatosis versus tachy-mediated. Unable to get cardiac MRI due to claustrophobia.  . Peptic ulcer disease   . Fatty liver    Past Surgical History  Procedure Laterality Date  . Inguinal hernia repair    . Back surgery  1991    Lumbar  . Tonsillectomy      reports that he quit smoking about 9 years ago. He has never used smokeless tobacco. He reports that he drinks about 0.5 ounces of alcohol per week. He reports that he uses illicit drugs. family history includes Alcohol abuse in his father; Colon polyps in his mother; Diabetes in his mother, paternal grandfather, and paternal grandmother; Hypertension in his father; Multiple sclerosis in his mother and sister. There is no history of Colon cancer. Allergies  Allergen Reactions  . Lipitor [Atorvastatin]     myalgias  . Metoclopramide Hcl   . Penicillins   . Sertraline Hcl    Current Outpatient Prescriptions on File Prior to Visit  Medication Sig Dispense Refill  . aspirin 81 MG EC tablet Take 81 mg by mouth daily.        Marland Kitchen  cetirizine (ZYRTEC) 10 MG tablet Take 1 tablet (10 mg total) by mouth as needed.  90 tablet  3  . diazepam (VALIUM) 5 MG tablet Take 1 tablet (5 mg total) by mouth daily as needed for anxiety.  30 tablet  0  . fluticasone (FLONASE) 50 MCG/ACT nasal spray Place 2 sprays into the nose as needed.  16 g  5  . levothyroxine (SYNTHROID, LEVOTHROID) 25 MCG tablet Take 1 tablet (25 mcg total) by mouth daily.  90 tablet  3  . lisinopril (PRINIVIL,ZESTRIL) 10 MG tablet Take 1 tablet (10 mg total) by mouth 2 (two) times daily.  180 tablet  2  . metFORMIN (GLUCOPHAGE-XR) 500 MG 24 hr  tablet 3 tabs by mouth in the AM  270 tablet  2  . metoprolol succinate (TOPROL-XL) 100 MG 24 hr tablet TAKE ONE TABLET BY MOUTH TWICE DAILY  180 tablet  2  . pantoprazole (PROTONIX) 40 MG tablet Take 1 tablet (40 mg total) by mouth daily.  90 tablet  2   No current facility-administered medications on file prior to visit.   Review of Systems  Constitutional: Negative for unexpected weight change, or unusual diaphoresis  HENT: Negative for tinnitus.   Eyes: Negative for photophobia and visual disturbance.  Respiratory: Negative for choking and stridor.   Gastrointestinal: Negative for vomiting and blood in stool.  Genitourinary: Negative for hematuria and decreased urine volume.  Musculoskeletal: Negative for acute joint swelling Skin: Negative for color change and wound.  Neurological: Negative for tremors and numbness other than noted  Psychiatric/Behavioral: Negative for decreased concentration or  hyperactivity.       Objective:   Physical Exam BP 122/80  Pulse 64  Temp(Src) 97.9 F (36.6 C) (Oral)  Ht 5' 10.5" (1.791 m)  Wt 228 lb 4 oz (103.534 kg)  BMI 32.28 kg/m2  SpO2 97% VS noted,  Constitutional: Pt appears well-developed and well-nourished.  HENT: Head: NCAT.  Right Ear: External ear normal.  Left Ear: External ear normal.  Eyes: Conjunctivae and EOM are normal. Pupils are equal, round, and reactive to light.  Neck: Normal range of motion. Neck supple. thyroid with prob enlarged NT nodular aspect right side, no other neck LA Cardiovascular: Normal rate and regular rhythm.   Pulmonary/Chest: Effort normal and breath sounds normal.  Neurological: Pt is alert. Not confused  Skin: Skin is warm. No erythema.  Psychiatric: Pt behavior is normal. Thought content normal.     Assessment & Plan:

## 2013-05-12 NOTE — Assessment & Plan Note (Signed)
stable overall by history and exam, recent data reviewed with pt, and pt to continue medical treatment as before,  to f/u any worsening symptoms or concerns For f/u labs later today

## 2013-05-12 NOTE — Assessment & Plan Note (Signed)
stable overall by history and exam, recent data reviewed with pt, and pt to continue medical treatment as before,  to f/u any worsening symptoms or concerns BP Readings from Last 3 Encounters:  05/12/13 122/80  05/02/13 128/80  11/12/12 110/70

## 2013-05-12 NOTE — Assessment & Plan Note (Signed)
stable overall by history and exam, recent data reviewed with pt, and pt to continue medical treatment as before,  to f/u any worsening symptoms or concerns Lab Results  Component Value Date   LDLCALC 79 11/30/2008   For pravachol 20 qd in light of liver dz and ldl increased last visit off statin

## 2013-05-12 NOTE — Addendum Note (Signed)
Addended by: Scharlene Gloss B on: 05/12/2013 10:57 AM   Modules accepted: Orders

## 2013-05-13 LAB — BASIC METABOLIC PANEL
CO2: 21 mEq/L (ref 19–32)
Calcium: 9.1 mg/dL (ref 8.4–10.5)
GFR: 75.14 mL/min (ref >60.00)
Potassium: 4.3 mEq/L (ref 3.5–5.1)
Sodium: 138 mEq/L (ref 135–145)

## 2013-05-13 LAB — HEPATIC FUNCTION PANEL
Albumin: 4 g/dL (ref 3.5–5.2)
Total Bilirubin: 0.6 mg/dL (ref 0.3–1.2)

## 2013-05-13 LAB — LIPID PANEL
Total CHOL/HDL Ratio: 6
Triglycerides: 266 mg/dL — ABNORMAL HIGH (ref 0.0–149.0)

## 2013-05-13 LAB — IBC PANEL: Saturation Ratios: 21.7 % (ref 20.0–50.0)

## 2013-05-17 ENCOUNTER — Encounter: Payer: Self-pay | Admitting: Internal Medicine

## 2013-05-17 ENCOUNTER — Ambulatory Visit
Admission: RE | Admit: 2013-05-17 | Discharge: 2013-05-17 | Disposition: A | Discharge status: Home / Self Care | Payer: Medicare Other | Source: Ambulatory Visit | Attending: Internal Medicine | Admitting: Internal Medicine

## 2013-05-17 DIAGNOSIS — E041 Nontoxic single thyroid nodule: Secondary | ICD-10-CM

## 2013-07-19 ENCOUNTER — Telehealth: Payer: Self-pay

## 2013-07-19 DIAGNOSIS — R079 Chest pain, unspecified: Secondary | ICD-10-CM

## 2013-07-19 MED ORDER — PANTOPRAZOLE SODIUM 40 MG PO TBEC: 40.0000 mg | DELAYED_RELEASE_TABLET | Freq: Every day | ORAL | Status: DC

## 2013-07-19 MED ORDER — METOPROLOL SUCCINATE ER 100 MG PO TB24: ORAL_TABLET | ORAL | Status: DC

## 2013-07-19 MED ORDER — METFORMIN HCL ER 500 MG PO TB24: ORAL_TABLET | ORAL | Status: DC

## 2013-07-19 MED ORDER — LISINOPRIL 10 MG PO TABS: 10.0000 mg | ORAL_TABLET | Freq: Two times a day (BID) | ORAL | Status: DC

## 2013-07-19 NOTE — Telephone Encounter (Signed)
Mail order refill 

## 2013-07-25 ENCOUNTER — Ambulatory Visit: Payer: Medicare Other | Admitting: Cardiology

## 2013-08-17 ENCOUNTER — Ambulatory Visit (INDEPENDENT_AMBULATORY_CARE_PROVIDER_SITE_OTHER): Payer: Medicare Other | Admitting: Cardiology

## 2013-08-17 ENCOUNTER — Encounter: Payer: Self-pay | Admitting: Cardiology

## 2013-08-17 VITALS — BP 110/80 | HR 69 | Ht 70.5 in | Wt 227.0 lb

## 2013-08-17 DIAGNOSIS — R002 Palpitations: Secondary | ICD-10-CM

## 2013-08-17 DIAGNOSIS — I428 Other cardiomyopathies: Secondary | ICD-10-CM

## 2013-08-17 DIAGNOSIS — I429 Cardiomyopathy, unspecified: Secondary | ICD-10-CM

## 2013-08-17 NOTE — Patient Instructions (Signed)
Your physician wants you to follow-up in: 1 year with Dr McLean. (December 2015). You will receive a reminder letter in the mail two months in advance. If you don't receive a letter, please call our office to schedule the follow-up appointment.  

## 2013-08-18 DIAGNOSIS — I428 Other cardiomyopathies: Secondary | ICD-10-CM | POA: Insufficient documentation

## 2013-08-18 NOTE — Progress Notes (Signed)
Patient ID: Keith [name], male   DOB: [birth year], 57 y.o.   MRN: [id number] PCP: Dr. Jonny Ruiz  57 yo with h/o DM2, hyperlipidemia, left heart cath in 3/10 showing mild luminal irregularities, hereditary hemochromatosis with periodic phlebotomy treatment, and atrial tachycardia presents for followup.  He has been noted to have a nonischemic cardiomyopathy with EF as low as 30% thought to be related to hemochromatosis.  Atrial tachycardia was symptomatic and has been suppressed by amiodarone and Toprol XL.  Patient had repeat echo on medical treatment of cardiomyopathy in 5/12.  This showed EF 55-60% (improved to normal range).   He had an ETT in 10/12 with no evidence of ischemia (atypical chest pain).    At last appointment, I had him try pravastatin.  He had to stop this due to myalgias.  I took him off amiodarone, and he has had no palpitations.  He has had left-sided chest pain on and off for the last 1.5 weeks.  It has actually been better for the last 3 days.  The chest pain occurs after meals when he is lying down.  It is not exertional.  No exertional dyspnea.  Main complaint has been joint pain (hands, shoulders).  Weight is stable.   Labs (5/11): BNP 21 Labs (6/11): TSH normal, AST 43, ALT 61 Labs (8/11): K 5.3, creatinine 1.2, TGs 233, HDL 69, LDL 134 Labs (9/11): K 4.5, creatinine 1.2, LFTs normal, TSH normal, LDL 104, HDL 30 Labs (2/12): K 4.6, creatinine 1.2, AST 48, ALT 62, LDL 99, HDL 26, TSH normal  Labs (2/84): K 4.4, creatinine 1.1, LFTs normal, TSH normal, HDL 33, LDL 88 Labs (8/14): K 4.3, creatinine 1.1, LDL 103, HDL 27, TGs 266  ECG: NSR, normal  Allergies (verified):  1)  ! Pcn 2)  ! Zoloft 3)  ! Reglan  Past Medical History: 1. Hyperlipidemia: myalgias with atorvastatin and pravastatin. 2. Type 2 diabetes. 3. Gastroesophageal reflux disease. 4. Hypertension. 5. Chronic C-spine pain. 6. Colonic polyps. 7. Low back pain.  The patient is status post spinal fusion  surgeries. 8. Hereditary hemochromatosis: Compound heterozygote.  Patient gets periodic phlebotomies 10. Anxiety. 11. Chest pain:  Exercise/adenosine Myoview showed EF of 50% (3/10).  There was inferior hypokinesis.  On perfusion images, there was inferior scar and ischemia in the apical anterior septum and in the inferior wall.  This is suggestive of a coronary disease. LHC (3/10) showed only luminal irregularities in the coronaries with EF 55%, suggesting that myoview was a false positive, likely from diaphragmatic attenuation.   12. Multifocal atrial tachycardia/wandering atrial pacemaker.  Holter monitor in 5/11 showed frequent runs of symptomatic MAT with heart rate around 100.  Patient is now on amiodarone to try to suppress MAT.  PFTs (6/11): FVC 105%, FEV1 109%, TLC 112%, DLCO 87% (minimal obstructive defect).  LFTs stable on amiodarone.  Holter (11/12) with PACs, blocked PACs, short runs of atrial tachycardia 13. Cardiomyopathy: echo (5/11) showed EF 30% with diffuse hypokinesis, normal wall thickness, mildly decreased RV systolic function.  This may be a cardiomyopathy due to hemochromatosis versus tachy-mediated.  Unable to get cardiac MRI due to claustrophobia.  Echo (5/12): EF 55-60%, mild LAE.  14. Hypothyroidism  Family History: mother with colon polyps, MS Father with HTN, ETOH sister with MS No FH of Colon Cancer: Family History of Diabetes: Mother, PGM, PGF  Social History: disabled with back pain Married, lives in [place].  Former Smoker-quit around 2004 Alcohol use-yes-3-4 drinks per month 3  children Drug Use - yes - quit in 1980 Daily Caffeine Use-3 drinks daily Patient does not get regular exercise.   Current Outpatient Prescriptions  Medication Sig Dispense Refill  . aspirin 81 MG EC tablet Take 81 mg by mouth daily.        . cetirizine (ZYRTEC) 10 MG tablet Take 1 tablet (10 mg total) by mouth as needed.  90 tablet  3  . diazepam (VALIUM) 5 MG tablet Take 1  tablet (5 mg total) by mouth daily as needed for anxiety.  30 tablet  0  . fluticasone (FLONASE) 50 MCG/ACT nasal spray Place 2 sprays into the nose as needed.  16 g  5  . levothyroxine (SYNTHROID, LEVOTHROID) 25 MCG tablet Take 1 tablet (25 mcg total) by mouth daily.  90 tablet  3  . lisinopril (PRINIVIL,ZESTRIL) 10 MG tablet Take 1 tablet (10 mg total) by mouth 2 (two) times daily.  180 tablet  3  . metFORMIN (GLUCOPHAGE-XR) 500 MG 24 hr tablet 3 tabs by mouth in the AM  270 tablet  3  . metoprolol succinate (TOPROL-XL) 100 MG 24 hr tablet TAKE ONE TABLET BY MOUTH TWICE DAILY  180 tablet  3  . pantoprazole (PROTONIX) 40 MG tablet Take 1 tablet (40 mg total) by mouth daily.  90 tablet  3   No current facility-administered medications for this visit.    BP 110/80  Pulse 69  Ht 5' 10.5" (1.791 m)  Wt 102.967 kg (227 lb)  BMI 32.10 kg/m2  SpO2 96% General:  Well developed, well nourished, in no acute distress. Neck:  Neck supple, no JVD. No masses, thyromegaly or abnormal cervical nodes. Lungs:  Clear bilaterally to auscultation and percussion. Heart:  Non-displaced PMI, chest non-tender; regular rate and rhythm, S1, S2 without murmurs, rubs or gallops. Carotid upstroke normal, no bruit.  Pedals normal pulses. No edema, no varicosities. Abdomen:  Bowel sounds positive; abdomen soft and non-tender without masses, organomegaly, or hernias noted. No hepatosplenomegaly. Extremities:  No clubbing or cyanosis. Neurologic:  Alert and oriented x 3. Psych:  Normal affect.  Assessment/Plan: 1. Rhythm: Patient has had multifocal atrial tachycardia and wandering atrial pacemaker noted by holter and ECG. Hemochromatosis with infiltration of the atria may be the cause of this arrhythmia. No atrial fibrillation noted so far. He had a significant burden of MAT, so I put him on amiodarone and Toprol XL. This has resolved his palpitations.  At last appointment, I took him off the amiodarone but left on the  Toprol XL.  He has had no tachypalpitations.  2. Cardiomyopathy: EF as low as 30% in the past with luminal irregularities in the coronaries. Cardiomyopathy was thought to be due to hemochromatosis versus tachycardia-mediated cardiomyopathy from frequent MAT. He has been treated medically with Toprol XL and lisinopril, and EF was improved to 55-60% on last echo in 5/12. 3. Hyperlipidemia: He has not tolerated atorvastatin or pravastatin due to myalgias.    Marca Ancona 08/18/2013

## 2013-08-29 ENCOUNTER — Ambulatory Visit: Payer: Medicare Other | Admitting: Cardiology

## 2013-09-17 ENCOUNTER — Ambulatory Visit (INDEPENDENT_AMBULATORY_CARE_PROVIDER_SITE_OTHER): Payer: Medicare PPO | Admitting: Family Medicine

## 2013-09-17 ENCOUNTER — Ambulatory Visit: Payer: Medicare PPO

## 2013-09-17 VITALS — BP 110/60 | HR 90 | Temp 99.5°F | Resp 18 | Ht 71.0 in | Wt 228.0 lb

## 2013-09-17 DIAGNOSIS — Z8679 Personal history of other diseases of the circulatory system: Secondary | ICD-10-CM

## 2013-09-17 DIAGNOSIS — R0989 Other specified symptoms and signs involving the circulatory and respiratory systems: Secondary | ICD-10-CM

## 2013-09-17 DIAGNOSIS — R05 Cough: Secondary | ICD-10-CM

## 2013-09-17 DIAGNOSIS — J209 Acute bronchitis, unspecified: Secondary | ICD-10-CM

## 2013-09-17 DIAGNOSIS — R059 Cough, unspecified: Secondary | ICD-10-CM

## 2013-09-17 LAB — POCT INFLUENZA A/B
INFLUENZA A, POC: NEGATIVE
Influenza B, POC: NEGATIVE

## 2013-09-17 MED ORDER — DOXYCYCLINE HYCLATE 100 MG PO CAPS: 100.0000 mg | ORAL_CAPSULE | Freq: Two times a day (BID) | ORAL | Status: DC

## 2013-09-17 NOTE — Progress Notes (Signed)
[facility] [address], [place] [address] [phone]  Date:  09/17/2013   Name:  KAELIN DEMAN   DOB:  05-31-1956   MRN:  [id number]  PCP:  Cathlean Cower, MD    Chief Complaint: Chest congestion   History of Present Illness:  DALYN BURCZYK is a 58 y.o. very pleasant male patient who presents with the following:  He is here today with chest congestion for a couple of weeks.  He is coughing but had not been coughing up much material- however this am he did cough up a lot of phlegm and now feels better.   He had not noted any fever at home.  He tends to have body aches at baseline.   He has noted a ST for about 2 weeks- it had been quite bad, but is now mostly just when he gets up in the am.  He did have a runny, stuffy nose just yesterday.   He has not had any CP.   Last night he felt a bit SOB- this is now resolved.   No sick contacts at home.    He has a history of hemochromatosis which caused cardiomyopathy and liver abnormality.  No history of PE or DVT.  No travel.  No calf pain or swelling.   He is a pt of Dr. Aundra Dubin and was seen not long ago. He stopped amiodarone last year.  His EF had been down to 30% at one point but was back to normal more recently.   His LFTs looked fine in August of 2014.  Patient Active Problem List   Diagnosis Date Noted  . Cardiomyopathy 08/18/2013  . Right thyroid nodule 05/12/2013  . Hypothyroidism due to amiodarone 05/12/2012  . Abnormal TSH 11/13/2011  . GERD (gastroesophageal reflux disease) 08/01/2011  . Fatty infiltration of liver 08/01/2011  . Iron storage disease 08/01/2011  . Diverticulosis of colon (without mention of hemorrhage) 08/01/2011  . Palpitations 07/02/2011  . Preventative health care 05/13/2011  . TRIGGER FINGER, RIGHT MIDDLE 11/12/2010  . FLATULENCE ERUCTATION AND GAS PAIN 10/11/2010  . Chronic systolic heart failure [id number]  . TACHYCARDIA 01/22/2010  . Cervicalgia 05/03/2009  .  HEMOCHROMATOSIS 01/23/2009  . OTHER SPECIFIED ARTHROPATHY MULTIPLE SITES 01/23/2009  . HEMOCHROMATOSIS 01/23/2009  . ABNORMAL STRESS ELECTROCARDIOGRAM 11/13/2008  . CHEST PAIN 11/02/2008  . TRANSAMINASES, SERUM, ELEVATED 11/02/2008  . Dysfunction of eustachian tube 05/02/2008  . OTITIS MEDIA, SEROUS 01/12/2008  . DIABETES MELLITUS, TYPE II 11/02/2007  . ANXIETY 11/02/2007  . PEPTIC ULCER DISEASE 11/02/2007  . LOW BACK PAIN 11/02/2007  . HYPERLIPIDEMIA 06/07/2007  . HYPERTENSION 06/07/2007  . GERD 06/07/2007  . POLYP, GALLBLADDER 06/07/2007  . COLONIC POLYPS, HX OF 06/07/2007  . FATTY LIVER DISEASE, HX OF 06/07/2007    Past Medical History  Diagnosis Date  . Hyperlipidemia   . Diabetes mellitus   . GERD (gastroesophageal reflux disease)   . Hypertension   . Cervical spine pain     Chronic  . History of colonic polyps   . Low back pain     The pt is S/P spinal fusion surgeries  . Hereditary hemochromatosis     Compound heterozygote. Pt gets periodic phlebotomies  . Anxiety   . Chest pain     Exercise/adenosine Myoview showed EF 50% (3/10). Inferior hypokinesis. Perfusion images, inferior scar and ischemia in the apical anterior septum and in teh inferior wall . Suggestive of a CD. LHC (3/10) showed only  lu minal irregulatrities in the coronaries w/ EF 55%, suggesting that myoview was a false positive, likely from diaphragmatic attnuation.  . Multifocal atrial tachycardia     Wandering atrial pacemaker. Holter monior in 5/11 showed frequent runs of symtomatic MAT with heart rate around 100. Pt is now  on amiodarone to try to suppress MAT. PFTs (6/11): FVC 105%, FEV1 109%, TLC 112%, DLCO 87% (minimal obstructive effect). LFTs stable on amiodarone  . Cardiomyopathy     Echo (5/11) showed EF 30% with diffuse hypokinesis, normal wall thickness, mildly decreased RV systolic function. This may be a cardiomyopathy due to hemochromatosis versus tachy-mediated. Unable to get cardiac MRI  due to claustrophobia.  . Peptic ulcer disease   . Fatty liver     Past Surgical History  Procedure Laterality Date  . Inguinal hernia repair    . Back surgery  1991    Lumbar  . Tonsillectomy      History  Substance Use Topics  . Smoking status: Former Smoker    Quit date: 09/16/2003  . Smokeless tobacco: Never Used  . Alcohol Use: 0.5 oz/week    1 drink(s) per week     Comment: 3-4 drinks per month    Family History  Problem Relation Age of Onset  . Colon polyps Mother   . Multiple sclerosis Mother   . Diabetes Mother   . Hypertension Father   . Alcohol abuse Father   . Multiple sclerosis Sister   . Diabetes Paternal Grandmother   . Diabetes Paternal Grandfather   . Colon cancer Neg Hx     Allergies  Allergen Reactions  . Lipitor [Atorvastatin]     myalgias  . Metoclopramide Hcl   . Penicillins   . Sertraline Hcl     Medication list has been reviewed and updated.  Current Outpatient Prescriptions on File Prior to Visit  Medication Sig Dispense Refill  . aspirin 81 MG EC tablet Take 81 mg by mouth daily.        Marland Kitchen levothyroxine (SYNTHROID, LEVOTHROID) 25 MCG tablet Take 1 tablet (25 mcg total) by mouth daily.  90 tablet  3  . lisinopril (PRINIVIL,ZESTRIL) 10 MG tablet Take 1 tablet (10 mg total) by mouth 2 (two) times daily.  180 tablet  3  . metFORMIN (GLUCOPHAGE-XR) 500 MG 24 hr tablet 3 tabs by mouth in the AM  270 tablet  3  . metoprolol succinate (TOPROL-XL) 100 MG 24 hr tablet TAKE ONE TABLET BY MOUTH TWICE DAILY  180 tablet  3  . pantoprazole (PROTONIX) 40 MG tablet Take 1 tablet (40 mg total) by mouth daily.  90 tablet  3  . cetirizine (ZYRTEC) 10 MG tablet Take 1 tablet (10 mg total) by mouth as needed.  90 tablet  3  . diazepam (VALIUM) 5 MG tablet Take 1 tablet (5 mg total) by mouth daily as needed for anxiety.  30 tablet  0  . fluticasone (FLONASE) 50 MCG/ACT nasal spray Place 2 sprays into the nose as needed.  16 g  5   No current  facility-administered medications on file prior to visit.    Review of Systems:  As per HPI- otherwise negative.   Physical Examination: Filed Vitals:   09/17/13 1631  BP: 110/60  Pulse: 102  Temp: 99.5 F (37.5 C)  Resp: 18   Filed Vitals:   09/17/13 1631  Height: 5\' 11"  (1.803 m)  Weight: 228 lb (103.42 kg)   Body mass index is 31.81 kg/(m^2).  Ideal Body Weight: Weight in (lb) to have BMI = 25: 178.9  GEN: WDWN, NAD, Non-toxic, A & O x 3, overweight, looks well HEENT: Atraumatic, Normocephalic. Neck supple. No masses, No LAD.  Bilateral TM wnl, oropharynx normal.  PEERL,EOMI.   Ears and Nose: No external deformity. CV: RRR, No M/G/R. No JVD. No thrill. No extra heart sounds. PULM: CTA B, no wheezes, crackles, rhonchi. No retractions. No resp. distress. No accessory muscle use. ABD: S, NT, ND, +BS. No rebound. No HSM. EXTR: No c/c/e NEURO Normal gait.  PSYCH: Normally interactive. Conversant. Not depressed or anxious appearing.  Calm demeanor.  No calf swelling or tenderness.    EKG: NSR, no ST elevation or depression.  Rate 100  [facility] reading (PRIMARY) by  Dr. Lorelei Pont. CXR: negative  CHEST 2 VIEW  COMPARISON: No priors.  FINDINGS: Linear opacity in the lesion of the lingula is compatible with an area of subsegmental atelectasis and/or scarring. Lung volumes are normal. No consolidative airspace disease. No pleural effusions. No pneumothorax. No pulmonary nodule or mass noted. Pulmonary vasculature and the cardiomediastinal silhouette are within normal limits. Atherosclerosis in the thoracic aorta.  IMPRESSION: 1. No radiographic evidence of acute cardiopulmonary disease. 2. Atherosclerosis.  Results for orders placed in visit on 09/17/13  POCT INFLUENZA A/B      Result Value Range   Influenza A, POC Negative     Influenza B, POC Negative      Assessment and Plan: Acute bronchitis - Plan: doxycycline (VIBRAMYCIN) 100 MG capsule  Cough - Plan: POCT  Influenza A/B  Chest congestion - Plan: DG Chest 2 View  History of cardiomyopathy - Plan: EKG 12-Lead  Juanya is here today with a cough for a couple of weeks and an episode of chest tightness last night.  He feels a lot better since he coughed up a lot of phlegm this morning.   His EKG and CXR are reassuring.   Explained that he is borderline tachycardic today.  We need to consider PE as a possibility.  Discussed a D dimer vs CT angiogram.  At his time he would prefer to wait and see how he does over the next day or so.  He is feeling better and does not feel SOB at all at this time.  This is a reasonable plan and he agrees to seek care right away if he becomes SOB again.    Signed Lamar Blinks, MD

## 2013-09-17 NOTE — Patient Instructions (Signed)
Take the doxycycline twice a day for 10 days.  Let me know if you are not feeling better in the next few days-Sooner if worse.   IF you have any persistent shortness of breath please call or otherwise seek care right away

## 2013-10-20 ENCOUNTER — Telehealth: Payer: Self-pay | Admitting: Internal Medicine

## 2013-10-20 NOTE — Telephone Encounter (Signed)
Sonia Baller with [employer] is calling in regards to a 3-page form they faxed [place] in December for the patient's diabetic shoes. The last page of the form(pg 3) was not filled out or signed. Sonia Baller asked that this be filled out and re-sent to them. Fax#: [phone]

## 2013-10-21 NOTE — Telephone Encounter (Signed)
Form received and completed by MD then faxed by as requested.

## 2013-10-25 ENCOUNTER — Other Ambulatory Visit (INDEPENDENT_AMBULATORY_CARE_PROVIDER_SITE_OTHER): Payer: Medicare HMO

## 2013-10-25 DIAGNOSIS — Z0000 Encounter for general adult medical examination without abnormal findings: Secondary | ICD-10-CM

## 2013-10-25 DIAGNOSIS — E119 Type 2 diabetes mellitus without complications: Secondary | ICD-10-CM

## 2013-10-25 LAB — BASIC METABOLIC PANEL
BUN: 15 mg/dL (ref 6–23)
CALCIUM: 9.5 mg/dL (ref 8.4–10.5)
CO2: 26 mEq/L (ref 19–32)
CREATININE: 1.2 mg/dL (ref 0.4–1.5)
Chloride: 100 mEq/L (ref 96–112)
GFR: 69.08 mL/min (ref >60.00)
Glucose, Bld: 258 mg/dL — ABNORMAL HIGH (ref 70–99)
Potassium: 4.1 mEq/L (ref 3.5–5.1)
SODIUM: 134 meq/L — AB (ref 135–145)

## 2013-10-25 LAB — MICROALBUMIN / CREATININE URINE RATIO
Creatinine,U: 89.6 mg/dL
Microalb Creat Ratio: 0.1 mg/g (ref 0.0–30.0)
Microalb, Ur: 0.1 mg/dL (ref 0.0–1.9)

## 2013-10-25 LAB — CBC WITH DIFFERENTIAL/PLATELET
BASOS PCT: 1.2 % (ref 0.0–3.0)
Basophils Absolute: 0.1 10*3/uL (ref 0.0–0.1)
Eosinophils Absolute: 0.2 10*3/uL (ref 0.0–0.7)
Eosinophils Relative: 2.1 % (ref 0.0–5.0)
HCT: 47.5 % (ref 39.0–52.0)
HEMOGLOBIN: 15.9 g/dL (ref 13.0–17.0)
LYMPHS PCT: 32 % (ref 12.0–46.0)
Lymphs Abs: 2.8 10*3/uL (ref 0.7–4.0)
MCHC: 33.5 g/dL (ref 30.0–36.0)
MCV: 90.7 fl (ref 78.0–100.0)
MONOS PCT: 8 % (ref 3.0–12.0)
Monocytes Absolute: 0.7 10*3/uL (ref 0.1–1.0)
NEUTROS ABS: 5 10*3/uL (ref 1.4–7.7)
Neutrophils Relative %: 56.7 % (ref 43.0–77.0)
Platelets: 220 10*3/uL (ref 150.0–400.0)
RBC: 5.24 Mil/uL (ref 4.22–5.81)
RDW: 13.3 % (ref 11.5–14.6)
WBC: 8.8 10*3/uL (ref 4.5–10.5)

## 2013-10-25 LAB — LIPID PANEL
CHOL/HDL RATIO: 6
Cholesterol: 171 mg/dL (ref 0–200)
HDL: 26.6 mg/dL — AB (ref >39.00)
TRIGLYCERIDES: 328 mg/dL — AB (ref 0.0–149.0)
VLDL: 65.6 mg/dL — ABNORMAL HIGH (ref 0.0–40.0)

## 2013-10-25 LAB — URINALYSIS, ROUTINE W REFLEX MICROSCOPIC
Bilirubin Urine: NEGATIVE
HGB URINE DIPSTICK: NEGATIVE
Ketones, ur: NEGATIVE
Leukocytes, UA: NEGATIVE
Nitrite: NEGATIVE
Specific Gravity, Urine: 1.015 (ref 1.000–1.030)
Total Protein, Urine: NEGATIVE
Urobilinogen, UA: 0.2 (ref 0.0–1.0)
WBC, UA: NONE SEEN — AB (ref 0–?)
pH: 6 (ref 5.0–8.0)

## 2013-10-25 LAB — HEPATIC FUNCTION PANEL
ALBUMIN: 4 g/dL (ref 3.5–5.2)
ALK PHOS: 66 U/L (ref 39–117)
ALT: 83 U/L — ABNORMAL HIGH (ref 0–53)
AST: 57 U/L — ABNORMAL HIGH (ref 0–37)
BILIRUBIN DIRECT: 0.1 mg/dL (ref 0.0–0.3)
Total Bilirubin: 0.7 mg/dL (ref 0.3–1.2)
Total Protein: 7.2 g/dL (ref 6.0–8.3)

## 2013-10-25 LAB — HEMOGLOBIN A1C: Hgb A1c MFr Bld: 7.8 % — ABNORMAL HIGH (ref 4.6–6.5)

## 2013-10-25 LAB — TSH: TSH: 2.02 u[IU]/mL (ref 0.35–5.50)

## 2013-10-25 LAB — PSA: PSA: 1.02 ng/mL (ref 0.10–4.00)

## 2013-10-25 LAB — LDL CHOLESTEROL, DIRECT: Direct LDL: 93.5 mg/dL

## 2013-10-26 ENCOUNTER — Encounter: Payer: Self-pay | Admitting: Internal Medicine

## 2013-10-26 ENCOUNTER — Ambulatory Visit (INDEPENDENT_AMBULATORY_CARE_PROVIDER_SITE_OTHER): Payer: Medicare HMO | Admitting: Internal Medicine

## 2013-10-26 VITALS — BP 110/80 | HR 78 | Temp 98.4°F | Ht 70.0 in | Wt 225.4 lb

## 2013-10-26 DIAGNOSIS — IMO0001 Reserved for inherently not codable concepts without codable children: Secondary | ICD-10-CM

## 2013-10-26 DIAGNOSIS — M79641 Pain in right hand: Secondary | ICD-10-CM

## 2013-10-26 DIAGNOSIS — E1165 Type 2 diabetes mellitus with hyperglycemia: Secondary | ICD-10-CM

## 2013-10-26 DIAGNOSIS — M7710 Lateral epicondylitis, unspecified elbow: Secondary | ICD-10-CM

## 2013-10-26 DIAGNOSIS — G5600 Carpal tunnel syndrome, unspecified upper limb: Secondary | ICD-10-CM

## 2013-10-26 DIAGNOSIS — M79609 Pain in unspecified limb: Secondary | ICD-10-CM

## 2013-10-26 DIAGNOSIS — I429 Cardiomyopathy, unspecified: Secondary | ICD-10-CM

## 2013-10-26 DIAGNOSIS — M79642 Pain in left hand: Secondary | ICD-10-CM | POA: Insufficient documentation

## 2013-10-26 DIAGNOSIS — E119 Type 2 diabetes mellitus without complications: Secondary | ICD-10-CM

## 2013-10-26 DIAGNOSIS — M7711 Lateral epicondylitis, right elbow: Secondary | ICD-10-CM

## 2013-10-26 DIAGNOSIS — Z0000 Encounter for general adult medical examination without abnormal findings: Secondary | ICD-10-CM

## 2013-10-26 MED ORDER — IBUPROFEN 400 MG PO TABS: 400.0000 mg | ORAL_TABLET | Freq: Four times a day (QID) | ORAL | Status: DC | PRN

## 2013-10-26 MED ORDER — GLIPIZIDE ER 2.5 MG PO TB24: 2.5000 mg | ORAL_TABLET | Freq: Every day | ORAL | Status: DC

## 2013-10-26 NOTE — Assessment & Plan Note (Signed)

## 2013-10-26 NOTE — Assessment & Plan Note (Addendum)
Exam benign except for neuro deficit RUE and tender to right lateral epicondylar, which I suspect is 2 separate issure; likely has bilat CTS right > left, for wrist splint qhs , ibuprofen prn, and refer hand surgury; ? Related to iron storage dz vs other, consider b12 testing, declines now, likely will need emg/ncs when sees hand surgeon

## 2013-10-26 NOTE — Assessment & Plan Note (Signed)
Mild, asked also to use right forearm band when working on the car, ibuprofen prn,  to f/u any worsening symptoms or concerns
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
## 2013-10-26 NOTE — Assessment & Plan Note (Signed)
Mild uncontrolled, - for add glipizide 2.5 xl qd, cont other meds, diet, wt loss efforts

## 2013-10-26 NOTE — Patient Instructions (Addendum)
Please take all new medication as prescribed - the ibuprofen, and the low dose generic glipizide Please continue all other medications as before, and refills have been done if requested.  Please use the wrist splints on left and right at night only for at least one wk to see if helps  You will be contacted regarding the referral for: Hand Surgury  You have been supplied the referrals to GI and Card due to your insurance requirement for continuity of care  Please continue all other medications as before, and refills have been done if requested. Please have the pharmacy call with any other refills you may need.  Please keep your appointments with your specialists as you have planned  Please remember to sign up for MyChart if you have not done so, as this will be important to you in the future with finding out test results, communicating by private email, and scheduling acute appointments online when needed.  Please return in 6 months, or sooner if needed, with Lab testing done 3-5 days before

## 2013-10-27 ENCOUNTER — Ambulatory Visit: Payer: Medicare HMO | Admitting: Cardiology

## 2013-10-28 ENCOUNTER — Telehealth: Payer: Self-pay

## 2013-10-28 NOTE — Telephone Encounter (Signed)
Relevant patient education mailed to patient.  

## 2013-11-03 ENCOUNTER — Other Ambulatory Visit: Payer: Self-pay

## 2013-11-03 DIAGNOSIS — T462X1A Poisoning by other antidysrhythmic drugs, accidental (unintentional), initial encounter: Secondary | ICD-10-CM

## 2013-11-03 DIAGNOSIS — E032 Hypothyroidism due to medicaments and other exogenous substances: Secondary | ICD-10-CM

## 2013-11-03 DIAGNOSIS — R079 Chest pain, unspecified: Secondary | ICD-10-CM

## 2013-11-03 MED ORDER — GLIPIZIDE ER 2.5 MG PO TB24: 2.5000 mg | ORAL_TABLET | Freq: Every day | ORAL | Status: DC

## 2013-11-03 MED ORDER — LEVOTHYROXINE SODIUM 25 MCG PO TABS: 25.0000 ug | ORAL_TABLET | Freq: Every day | ORAL | Status: DC

## 2013-11-03 MED ORDER — LISINOPRIL 10 MG PO TABS: 10.0000 mg | ORAL_TABLET | Freq: Two times a day (BID) | ORAL | Status: DC

## 2013-11-03 MED ORDER — DIAZEPAM 5 MG PO TABS: 5.0000 mg | ORAL_TABLET | Freq: Every day | ORAL | Status: DC | PRN

## 2013-11-03 MED ORDER — METFORMIN HCL ER 500 MG PO TB24: ORAL_TABLET | ORAL | Status: DC

## 2013-11-03 MED ORDER — PANTOPRAZOLE SODIUM 40 MG PO TBEC: 40.0000 mg | DELAYED_RELEASE_TABLET | Freq: Every day | ORAL | Status: DC

## 2013-11-03 MED ORDER — METOPROLOL SUCCINATE ER 100 MG PO TB24: ORAL_TABLET | ORAL | Status: DC

## 2013-11-03 NOTE — Telephone Encounter (Signed)
Done hardcopy to [facility]  

## 2013-11-03 NOTE — Telephone Encounter (Signed)
Faxed hardcopy to [facility]. 

## 2013-11-15 ENCOUNTER — Ambulatory Visit: Payer: Medicare Other | Admitting: Internal Medicine

## 2013-12-06 ENCOUNTER — Encounter: Payer: Self-pay | Admitting: Internal Medicine

## 2013-12-07 ENCOUNTER — Ambulatory Visit (INDEPENDENT_AMBULATORY_CARE_PROVIDER_SITE_OTHER): Payer: Commercial Managed Care - HMO | Admitting: Internal Medicine

## 2013-12-07 ENCOUNTER — Encounter: Payer: Self-pay | Admitting: Internal Medicine

## 2013-12-07 ENCOUNTER — Other Ambulatory Visit (INDEPENDENT_AMBULATORY_CARE_PROVIDER_SITE_OTHER): Payer: Commercial Managed Care - HMO

## 2013-12-07 DIAGNOSIS — K219 Gastro-esophageal reflux disease without esophagitis: Secondary | ICD-10-CM

## 2013-12-07 DIAGNOSIS — I429 Cardiomyopathy, unspecified: Secondary | ICD-10-CM

## 2013-12-07 DIAGNOSIS — I428 Other cardiomyopathies: Secondary | ICD-10-CM

## 2013-12-07 DIAGNOSIS — R079 Chest pain, unspecified: Secondary | ICD-10-CM

## 2013-12-07 DIAGNOSIS — M2550 Pain in unspecified joint: Secondary | ICD-10-CM

## 2013-12-07 LAB — FERRITIN: FERRITIN: 78.9 ng/mL (ref 22.0–322.0)

## 2013-12-07 LAB — HEPATIC FUNCTION PANEL
ALBUMIN: 4.2 g/dL (ref 3.5–5.2)
ALT: 57 U/L — ABNORMAL HIGH (ref 0–53)
AST: 45 U/L — AB (ref 0–37)
Alkaline Phosphatase: 67 U/L (ref 39–117)
Bilirubin, Direct: 0.1 mg/dL (ref 0.0–0.3)
Total Bilirubin: 0.7 mg/dL (ref 0.3–1.2)
Total Protein: 7.4 g/dL (ref 6.0–8.3)

## 2013-12-07 LAB — CBC
HCT: 44.5 % (ref 39.0–52.0)
HEMOGLOBIN: 15.1 g/dL (ref 13.0–17.0)
MCHC: 33.8 g/dL (ref 30.0–36.0)
MCV: 88.4 fl (ref 78.0–100.0)
Platelets: 219 10*3/uL (ref 150.0–400.0)
RBC: 5.03 Mil/uL (ref 4.22–5.81)
RDW: 13.4 % (ref 11.5–14.6)
WBC: 8.4 10*3/uL (ref 4.5–10.5)

## 2013-12-07 LAB — FOLATE: Folate: 7.5 ng/mL (ref >5.9)

## 2013-12-07 LAB — IBC PANEL
Iron: 71 ug/dL (ref 42–165)
Saturation Ratios: 22.4 % (ref 20.0–50.0)
Transferrin: 226.6 mg/dL (ref 212.0–360.0)

## 2013-12-07 LAB — PROTIME-INR
INR: 1.1 ratio — AB (ref 0.8–1.0)
Prothrombin Time: 11.8 s (ref 10.2–12.4)

## 2013-12-07 LAB — VITAMIN B12: VITAMIN B 12: 475 pg/mL (ref 211–911)

## 2013-12-07 MED ORDER — PANTOPRAZOLE SODIUM 40 MG PO TBEC
40.0000 mg | DELAYED_RELEASE_TABLET | Freq: Every day | ORAL | Status: DC
Start: 2013-12-07 — End: 2014-09-08

## 2013-12-07 NOTE — Progress Notes (Signed)
Subjective:    Patient ID: Keith [name], male    DOB: 07/08/1956, 58 y.o.   MRN: [id number]  HPI Keith [name] is a 58 yo male with PMH of hereditary hemochromatosis (heterozygote for C282 and heterozygote for H63D), possible fatty liver disease, history of MAT with cardiomyopathy secondary to either iron overload or MAT, diffuse joint pains, diabetes, hyperlipidemia, hypertension, GERD, history of hyperplastic colon polyps who is seen in office followup. He was previously managed by Dr. Sharlett Iles, but was last seen in 2012. He is here alone today. His diagnosis with hereditary hemachromatosis was around 2009 and he did have phlebotomy for about 18 months. Initially his ferritin was greater than 500 and after phlebotomy it has remained less than 100.  Overall today he reports fairly decreased energy levels but also diffuse joint pains. He is bothered most by his right index finger which he reports is been worked up by Dr. Mardelle Matte recently. It sounds like he is also being treated for carpal tunnel. He also reports diffuse joint pains in bilateral hands and feet, wrists, elbows, knees and ankles. He denies abdominal pain. No increasing abdominal girth or lower extremity edema. No itching. No melena. Reports bowel movements are regular. He reports once per year he notices scant red blood with wiping but this has not occurred recently. He denies diarrhea or constipation. Also no change in bowel habits. Heartburn is very well controlled on pantoprazole 40 mg daily without dysphagia or odynophagia. No nausea or vomiting.  Review of Systems As per history of present illness, otherwise  Current Medications, Allergies, Past Medical History, Past Surgical History, Family History and Social History were reviewed in [employer] record.     Objective:   Physical Exam BP 104/70  Pulse 76  Ht 5\' 10"  (1.778 m)  Wt 227 lb 9.6 oz (103.239 kg)  BMI 32.66 kg/m2 Constitutional:  Well-developed and well-nourished. No distress. HEENT: Normocephalic and atraumatic. Oropharynx is clear and moist. No oropharyngeal exudate. Conjunctivae are normal.  No scleral icterus. Neck: Neck supple. Trachea midline. Cardiovascular: Normal rate, regular rhythm and intact distal pulses.  Pulmonary/chest: Effort normal and breath sounds normal. No wheezing, rales or rhonchi. Abdominal: Soft, obese, small umbilical hernia nontender, nondistended. Bowel sounds active throughout. No appreciable ascites Extremities: no clubbing, cyanosis, or edema Neurological: Alert and oriented to person place and time. Skin: Skin is warm and dry. No rashes noted. Psychiatric: Normal mood and affect. Behavior is normal.  CBC    Component Value Date/Time   WBC 8.8 10/25/2013 1325   RBC 5.24 10/25/2013 1325   HGB 15.9 10/25/2013 1325   HCT 47.5 10/25/2013 1325   PLT 220.0 10/25/2013 1325   MCV 90.7 10/25/2013 1325   MCHC 33.5 10/25/2013 1325   RDW 13.3 10/25/2013 1325   LYMPHSABS 2.8 10/25/2013 1325   MONOABS 0.7 10/25/2013 1325   EOSABS 0.2 10/25/2013 1325   BASOSABS 0.1 10/25/2013 1325    CMP     Component Value Date/Time   NA 134* 10/25/2013 1325   K 4.1 10/25/2013 1325   CL 100 10/25/2013 1325   CO2 26 10/25/2013 1325   GLUCOSE 258* 10/25/2013 1325   BUN 15 10/25/2013 1325   CREATININE 1.2 10/25/2013 1325   CALCIUM 9.5 10/25/2013 1325   PROT 7.2 10/25/2013 1325   ALBUMIN 4.0 10/25/2013 1325   AST 57* 10/25/2013 1325   ALT 83* 10/25/2013 1325   ALKPHOS 66 10/25/2013 1325   BILITOT 0.7 10/25/2013 1325  GFRNONAA 70.66 06/04/2010 0000   GFRAA 91 11/17/2008 1224    Iron/TIBC/Ferritin    Component Value Date/Time   IRON 67 05/12/2013 0918   FERRITIN 54.5 05/12/2013 0918   Colonoscopy -- 10/16/2010 -- excellent prep to the cecum. Mild sigmoid diverticulosis. Several rectal polyps removed. Pathology = hyperplastic polyp EGD 10/16/2010 -- hiatal hernia. Duodenitis. Code biopsies performed and negative. Large  rugal folds noted. Biopsies of the duodenum were normal.  ULTRASOUND ABDOMEN: -- 2012   Technique:  Sonography of upper abdominal structures was performed.   Comparison:  01/02/2009   Gallbladder:  4 mm polyp, seen previously.  No shadowing calculi, wall thickening or pericholecystic fluid.  No sonographic Murphy's sign.   Common bile duct:  Normal caliber 5 mm diameter   Liver:  Coarsened increased echogenicity the liver which could be due to steatosis or cirrhosis.  No focal mass or nodularity. Hepatopetal portal venous flow.   IVC:  Normal appearance   Pancreas:  Normal appearance   Spleen:  Normal appearance, 11.2 cm length   Right kidney:  12.3 cm length. Normal morphology without mass or hydronephrosis.   Left kidney:  12.4 cm length. Normal morphology without mass or hydronephrosis.   Aorta:  Proximal aorta normal caliber, with mid to distal abdominal aorta obscured by bowel gas.   Other:  No free fluid   IMPRESSION: Coarsened significantly increased echogenicity of the liver which could be due to steatosis or cirrhosis. No definite focal hepatic mass. 4 mm gallbladder polyp. Incomplete aortic visualization.     Assessment & Plan:  58 yo male with PMH of hereditary hemochromatosis (heterozygote for C282 and heterozygote for H63D), possible fatty liver disease, history of MAT with cardiomyopathy secondary to either iron overload or MAT, diffuse joint pains, diabetes, hyperlipidemia, hypertension, GERD, history of hyperplastic colon polyps who is seen in office followup.   1.  hereditary hemochromatosis -- compound heterozygote with previous iron overload. Status post previous phlebotomy.  His ferritin over the last several years has been less than 100 which indicates no current iron overload. I am rechecking iron studies today. There was possible coarsened hepatic echotexture on ultrasound in 2012 raising the question of advanced scarring or even cirrhosis. We  discussed this at length today. I am going to repeat an abdominal ultrasound with elastography to evaluate for scarring/cirrhosis. Also repeat CBC, INR, and hepatic function panel today. I am checking B12 and folate as well. I would not recommend phlebotomy and less ferritin levels or percent saturation becomes elevated  2.  Joint pain -- certainly could be secondary to hereditary hemachromatosis, but I would like to exclude an overlap condition such as rheumatoid arthritis. I would like him to be seen by rheumatology. For insurance purposes he needs to be referred by primary care. I will ask Dr. Jenny Reichmann to refer him to Dr. Amil Amen.  We did discuss that unfortunately, joint pains that are hemochromatosis associated often do not improve after phlebotomy (other non-arthropathy symptoms do improve with phlebotomy)  3.  GERD -- continue pantoprazole 40 mg daily  4.  Cardiomyopathy -- managed by Dr. Aundra Dubin, and felt to be secondary to iron overload and possibly MAT. EF has improved when last checked.  Return in 3 months

## 2013-12-07 NOTE — Patient Instructions (Addendum)
Your physician has requested that you go to the basement for lab work before leaving today.  You have been scheduled for an U/S Elastography at [facility] on 12/12/2013 @ 9:00am Please arrive at 8:45am to the radiology department on the first floor.  Have nothing to eat or drink 6 hours prior to you scan. If you need to cancel or reschedule for any reason please call: [phone]  Ultrasound elastography (EUS) is a method to assess the mechanical properties of tissue, by applying stress and detecting tissue displacement using ultrasound. There are several EUS techniques used in clinical practice; strain (compression) EUS is the most common technique that allows real-time visualisation of the elastographic map on the screen. There is increasing evidence that EUS can be used to measure the mechanical properties of musculoskeletal tissue in clinical practice, with the future potential for early diagnosis to both guide and monitor therapy. This review describes the various EUS techniques available for clinical use, presents the published evidence on musculoskeletal applications of EUS and discusses the technical issues, limitations and future perspectives of this method in the assessment of the musculoskeletal system.   We have sent the following medications to your pharmacy for you to pick up at your convenience:  Pantoprazole

## 2013-12-08 ENCOUNTER — Telehealth: Payer: Self-pay | Admitting: Internal Medicine

## 2013-12-08 DIAGNOSIS — M130 Polyarthritis, unspecified: Secondary | ICD-10-CM

## 2013-12-08 NOTE — Telephone Encounter (Signed)
His has been done. thanks

## 2013-12-08 NOTE — Telephone Encounter (Signed)
Message copied by Biagio Borg on Thu Dec 08, 2013  8:19 PM ------      Message from: Jerene Bears      Created: Wed Dec 07, 2013 12:30 PM       Dr. Jenny Reichmann      I would like to refer Mr. Yamada to Dr. Amil Amen with rheumatology for his diffuse arthropathies.  Certainly this could be hereditary hemochromatosis related, but want to exclude an overlap such as early RA. His insurance requires a consult requested be generated by your office. Is this okay with you?  Thanks      Ulice Dash Pyrtle ------

## 2013-12-12 ENCOUNTER — Ambulatory Visit (HOSPITAL_COMMUNITY)
Admission: RE | Admit: 2013-12-12 | Discharge: 2013-12-12 | Disposition: A | Discharge status: Home / Self Care | Payer: Medicare HMO | Source: Ambulatory Visit | Attending: Internal Medicine | Admitting: Internal Medicine

## 2013-12-12 DIAGNOSIS — N281 Cyst of kidney, acquired: Secondary | ICD-10-CM | POA: Insufficient documentation

## 2013-12-12 DIAGNOSIS — N289 Disorder of kidney and ureter, unspecified: Secondary | ICD-10-CM | POA: Insufficient documentation

## 2013-12-12 DIAGNOSIS — K7460 Unspecified cirrhosis of liver: Secondary | ICD-10-CM | POA: Insufficient documentation

## 2013-12-12 DIAGNOSIS — K824 Cholesterolosis of gallbladder: Secondary | ICD-10-CM | POA: Insufficient documentation

## 2013-12-12 DIAGNOSIS — K7689 Other specified diseases of liver: Secondary | ICD-10-CM | POA: Insufficient documentation

## 2014-04-25 ENCOUNTER — Encounter: Payer: Self-pay | Admitting: Internal Medicine

## 2014-04-25 ENCOUNTER — Other Ambulatory Visit (INDEPENDENT_AMBULATORY_CARE_PROVIDER_SITE_OTHER): Payer: Commercial Managed Care - HMO

## 2014-04-25 ENCOUNTER — Ambulatory Visit (INDEPENDENT_AMBULATORY_CARE_PROVIDER_SITE_OTHER): Payer: Commercial Managed Care - HMO | Admitting: Internal Medicine

## 2014-04-25 ENCOUNTER — Other Ambulatory Visit: Payer: Self-pay | Admitting: Internal Medicine

## 2014-04-25 VITALS — BP 120/80 | HR 75 | Temp 98.2°F | Ht 70.5 in | Wt 222.2 lb

## 2014-04-25 DIAGNOSIS — E119 Type 2 diabetes mellitus without complications: Secondary | ICD-10-CM

## 2014-04-25 DIAGNOSIS — I10 Essential (primary) hypertension: Secondary | ICD-10-CM

## 2014-04-25 DIAGNOSIS — E785 Hyperlipidemia, unspecified: Secondary | ICD-10-CM

## 2014-04-25 DIAGNOSIS — R7989 Other specified abnormal findings of blood chemistry: Secondary | ICD-10-CM

## 2014-04-25 DIAGNOSIS — Z0000 Encounter for general adult medical examination without abnormal findings: Secondary | ICD-10-CM

## 2014-04-25 LAB — LDL CHOLESTEROL, DIRECT: Direct LDL: 117 mg/dL

## 2014-04-25 LAB — BASIC METABOLIC PANEL
BUN: 11 mg/dL (ref 6–23)
CALCIUM: 9.2 mg/dL (ref 8.4–10.5)
CHLORIDE: 105 meq/L (ref 96–112)
CO2: 20 mEq/L (ref 19–32)
Creatinine, Ser: 1 mg/dL (ref 0.4–1.5)
GFR: 82.79 mL/min (ref >60.00)
Glucose, Bld: 248 mg/dL — ABNORMAL HIGH (ref 70–99)
Potassium: 3.9 mEq/L (ref 3.5–5.1)
Sodium: 135 mEq/L (ref 135–145)

## 2014-04-25 LAB — LIPID PANEL
CHOL/HDL RATIO: 7
CHOLESTEROL: 172 mg/dL (ref 0–200)
HDL: 24.8 mg/dL — AB (ref >39.00)
NonHDL: 147.2
TRIGLYCERIDES: 259 mg/dL — AB (ref 0.0–149.0)
VLDL: 51.8 mg/dL — AB (ref 0.0–40.0)

## 2014-04-25 LAB — HEPATIC FUNCTION PANEL
ALK PHOS: 84 U/L (ref 39–117)
ALT: 54 U/L — AB (ref 0–53)
AST: 40 U/L — AB (ref 0–37)
Albumin: 3.7 g/dL (ref 3.5–5.2)
BILIRUBIN DIRECT: 0.1 mg/dL (ref 0.0–0.3)
BILIRUBIN TOTAL: 0.6 mg/dL (ref 0.2–1.2)
Total Protein: 7.2 g/dL (ref 6.0–8.3)

## 2014-04-25 LAB — HEMOGLOBIN A1C: Hgb A1c MFr Bld: 8.1 % — ABNORMAL HIGH (ref 4.6–6.5)

## 2014-04-25 MED ORDER — GLIPIZIDE ER 5 MG PO TB24: 5.0000 mg | ORAL_TABLET | Freq: Every day | ORAL | Status: DC

## 2014-04-25 MED ORDER — LOSARTAN POTASSIUM 50 MG PO TABS: 50.0000 mg | ORAL_TABLET | Freq: Every day | ORAL | Status: DC

## 2014-04-25 NOTE — Progress Notes (Signed)
Pre visit review using our clinic review tool, if applicable. No additional management support is needed unless otherwise documented below in the visit note. 

## 2014-04-25 NOTE — Patient Instructions (Signed)
OK to stop the lisinopril 10 mg per day  Please take all new medication as prescribed - the losartan 50 mg per day  Please call in 2-3 wks for Blood Pressure if your BP at home seems > 130-140 on a regular basis, as you could probably use the higher strength of losartan  Please continue all other medications as before, and refills have been done if requested.  Please have the pharmacy call with any other refills you may need.   Please keep your appointments with your specialists as you may have planned  Please go to the LAB in the Basement (turn left off the elevator) for the tests to be done today  You will be contacted by phone if any changes need to be made immediately.  Otherwise, you will receive a letter about your results with an explanation, but please check with MyChart first.  Please remember to sign up for MyChart if you have not done so, as this will be important to you in the future with finding out test results, communicating by private email, and scheduling acute appointments online when needed.  Please return in 6 months, or sooner if needed, with Lab testing done 3-5 days before

## 2014-04-25 NOTE — Progress Notes (Signed)
Subjective:    Patient ID: Keith [name], male    DOB: 10-03-1955, 58 y.o.   MRN: [id number]  HPI  Here to f/u; overall doing ok,  Pt denies chest pain, increased sob or doe, wheezing, orthopnea, PND, increased LE swelling, palpitations, dizziness or syncope.  Pt denies polydipsia, polyuria, or low sugar symptoms such as weakness or confusion improved with po intake.  Pt denies new neurological symptoms such as new headache, or facial or extremity weakness or numbness.   Pt states overall good compliance with meds, has been trying to follow lower cholesterol, diabetic diet, with wt overall stable,  but little exercise however.  Recent CT neg for suspicion for malignancy per pt per [facility] mar 2015. Has ongoing mild cough, non prod, would like to try change lisinopril if possible. No other current complaints Past Medical History  Diagnosis Date  . Hyperlipidemia   . Diabetes mellitus   . GERD (gastroesophageal reflux disease)   . Hypertension   . Cervical spine pain     Chronic  . History of colonic polyps   . Low back pain     The pt is S/P spinal fusion surgeries  . Hereditary hemochromatosis     Compound heterozygote. Pt gets periodic phlebotomies  . Anxiety   . Chest pain     Exercise/adenosine Myoview showed EF 50% (3/10). Inferior hypokinesis. Perfusion images, inferior scar and ischemia in the apical anterior septum and in teh inferior wall . Suggestive of a CD. LHC (3/10) showed only lu minal irregulatrities in the coronaries w/ EF 55%, suggesting that myoview was a false positive, likely from diaphragmatic attnuation.  . Multifocal atrial tachycardia     Wandering atrial pacemaker. Holter monior in 5/11 showed frequent runs of symtomatic MAT with heart rate around 100. Pt is now  on amiodarone to try to suppress MAT. PFTs (6/11): FVC 105%, FEV1 109%, TLC 112%, DLCO 87% (minimal obstructive effect). LFTs stable on amiodarone  . Cardiomyopathy     Echo (5/11) showed EF  30% with diffuse hypokinesis, normal wall thickness, mildly decreased RV systolic function. This may be a cardiomyopathy due to hemochromatosis versus tachy-mediated. Unable to get cardiac MRI due to claustrophobia.  . Peptic ulcer disease   . Fatty liver   . Arthritis   . Thyroid nodule    Past Surgical History  Procedure Laterality Date  . Inguinal hernia repair    . Back surgery  1991    Lumbar  . Tonsillectomy      reports that he quit smoking about 10 years ago. He has never used smokeless tobacco. He reports that he drinks about .5 ounces of alcohol per week. He reports that he uses illicit drugs. family history includes Alcohol abuse in his father; Colitis in his mother; Colon polyps in his mother; Diabetes in his mother, paternal grandfather, and paternal grandmother; Hypertension in his father; Multiple sclerosis in his mother and sister. There is no history of Colon cancer. Allergies  Allergen Reactions  . Lipitor [Atorvastatin]     myalgias  . Metoclopramide Hcl   . Penicillins   . Sertraline Hcl    Current Outpatient Prescriptions on File Prior to Visit  Medication Sig Dispense Refill  . aspirin 81 MG EC tablet Take 81 mg by mouth daily.        . cetirizine (ZYRTEC) 10 MG tablet Take 1 tablet (10 mg total) by mouth as needed.  90 tablet  3  . diazepam (VALIUM) 5  MG tablet Take 1 tablet (5 mg total) by mouth daily as needed for anxiety.  30 tablet  1  . fluticasone (FLONASE) 50 MCG/ACT nasal spray Place 2 sprays into the nose as needed.  16 g  5  . ibuprofen (ADVIL,MOTRIN) 400 MG tablet Take 1 tablet (400 mg total) by mouth every 6 (six) hours as needed.  60 tablet  1  . levothyroxine (SYNTHROID, LEVOTHROID) 25 MCG tablet Take 1 tablet (25 mcg total) by mouth daily.  90 tablet  3  . metFORMIN (GLUCOPHAGE-XR) 500 MG 24 hr tablet 3 tabs by mouth in the AM  270 tablet  3  . metoprolol succinate (TOPROL-XL) 100 MG 24 hr tablet TAKE ONE TABLET BY MOUTH TWICE DAILY  180 tablet  3   . pantoprazole (PROTONIX) 40 MG tablet Take 1 tablet (40 mg total) by mouth daily.  90 tablet  3   No current facility-administered medications on file prior to visit.   Review of Systems  Constitutional: Negative for unusual diaphoresis or other sweats  HENT: Negative for ringing in ear Eyes: Negative for double vision or worsening visual disturbance.  Respiratory: Negative for choking and stridor.   Gastrointestinal: Negative for vomiting or other signifcant bowel change Genitourinary: Negative for hematuria or decreased urine volume.  Musculoskeletal: Negative for other MSK pain or swelling Skin: Negative for color change and worsening wound.  Neurological: Negative for tremors and numbness other than noted  Psychiatric/Behavioral: Negative for decreased concentration or agitation other than above       Objective:   Physical Exam BP 120/80  Pulse 75  Temp(Src) 98.2 F (36.8 C) (Oral)  Ht 5' 10.5" (1.791 m)  Wt 222 lb 4 oz (100.812 kg)  BMI 31.43 kg/m2  SpO2 97% VS noted,  Constitutional: Pt appears well-developed, well-nourished.  HENT: Head: NCAT.  Right Ear: External ear normal.  Left Ear: External ear normal.  Eyes: . Pupils are equal, round, and reactive to light. Conjunctivae and EOM are normal Neck: Normal range of motion. Neck supple.  Cardiovascular: Normal rate and regular rhythm.   Pulmonary/Chest: Effort normal and breath sounds normal.  Abd:  Soft, NT, ND, + BS Neurological: Pt is alert. Not confused , motor grossly intact Skin: Skin is warm. No rash Psychiatric: Pt behavior is normal. No agitation.     Assessment & Plan:

## 2014-05-01 NOTE — Assessment & Plan Note (Signed)
stable overall by history and exam, recent data reviewed with pt, and pt to continue medical treatment as before,  to f/u any worsening symptoms or concerns Lab Results  Component Value Date   LDLCALC 79 11/30/2008

## 2014-05-01 NOTE — Assessment & Plan Note (Signed)
stable overall by history and exam, recent data reviewed with pt, and pt to continue medical treatment as before,  to f/u any worsening symptoms or concerns Lab Results  Component Value Date   HGBA1C 8.1* 04/25/2014

## 2014-05-01 NOTE — Assessment & Plan Note (Addendum)
stable overall by history and exam, recent data reviewed with pt, and pt to continue medical treatment as before except to change lisinopril 10 to losartan due to possible causing cough,  to f/u any worsening symptoms or concerns BP Readings from Last 3 Encounters:  04/25/14 120/80  12/07/13 104/70  10/26/13 110/80

## 2014-06-02 ENCOUNTER — Encounter: Payer: Self-pay | Admitting: Gastroenterology

## 2014-06-28 ENCOUNTER — Telehealth: Payer: Self-pay | Admitting: Internal Medicine

## 2014-06-28 MED ORDER — GLIPIZIDE ER 5 MG PO TB24: 5.0000 mg | ORAL_TABLET | Freq: Every day | ORAL | Status: DC

## 2014-06-28 MED ORDER — LOSARTAN POTASSIUM 50 MG PO TABS: 50.0000 mg | ORAL_TABLET | Freq: Every day | ORAL | Status: DC

## 2014-06-28 NOTE — Telephone Encounter (Signed)
Resent prescriptions to [facility] and called the patient to inform

## 2014-06-28 NOTE — Telephone Encounter (Signed)
Patient states he needs scripts for glipizide 5 mg and losartan 50 mg sent to [facility].  Scripts were sent to wrong pharmacy.

## 2014-08-31 ENCOUNTER — Ambulatory Visit (INDEPENDENT_AMBULATORY_CARE_PROVIDER_SITE_OTHER): Payer: Commercial Managed Care - HMO | Admitting: Cardiology

## 2014-08-31 ENCOUNTER — Ambulatory Visit (INDEPENDENT_AMBULATORY_CARE_PROVIDER_SITE_OTHER): Payer: Commercial Managed Care - HMO | Admitting: *Deleted

## 2014-08-31 ENCOUNTER — Encounter: Payer: Self-pay | Admitting: *Deleted

## 2014-08-31 ENCOUNTER — Encounter: Payer: Self-pay | Admitting: Cardiology

## 2014-08-31 VITALS — BP 110/72 | HR 78 | Ht 70.5 in | Wt 229.1 lb

## 2014-08-31 DIAGNOSIS — Z23 Encounter for immunization: Secondary | ICD-10-CM

## 2014-08-31 DIAGNOSIS — R002 Palpitations: Secondary | ICD-10-CM

## 2014-08-31 DIAGNOSIS — I429 Cardiomyopathy, unspecified: Secondary | ICD-10-CM

## 2014-08-31 DIAGNOSIS — I10 Essential (primary) hypertension: Secondary | ICD-10-CM

## 2014-08-31 NOTE — Patient Instructions (Signed)
Your physician has requested that you have an echocardiogram. Echocardiography is a painless test that uses sound waves to create images of your heart. It provides your doctor with information about the size and shape of your heart and how well your heart's chambers and valves are working. This procedure takes approximately one hour. There are no restrictions for this procedure.  Your physician wants you to follow-up in: 1 year with Dr Aundra Dubin. (December 2016). You will receive a reminder letter in the mail two months in advance. If you don't receive a letter, please call our office to schedule the follow-up appointment.

## 2014-09-02 NOTE — Progress Notes (Signed)
Patient ID: Keith [name], male   DOB: 1956-01-31, 58 y.o.   MRN: [id number] PCP: Dr. Jenny Reichmann  58 yo with h/o DM2, hyperlipidemia, left heart cath in 3/10 showing mild luminal irregularities, hereditary hemochromatosis with periodic phlebotomy treatment, and atrial tachycardia presents for followup.  He has been noted to have a nonischemic cardiomyopathy with EF as low as 30% thought to be related to hemochromatosis.  Atrial tachycardia was symptomatic and has been suppressed by amiodarone and Toprol XL.  Patient had repeat echo on medical treatment of cardiomyopathy in 5/12.  This showed EF 55-60% (improved to normal range).   He had an ETT in 10/12 with no evidence of ischemia (atypical chest pain).    He has been stable from a cardiac standpoint.  Main concern has been diffuse joint pain that may be related to hemochromatosis.  He has not yet seen rheumatology.  No palpitations or lightheadedness.  No exertional dyspnea.  No chest pain.     Labs (5/11): BNP 21 Labs (6/11): TSH normal, AST 43, ALT 61 Labs (8/11): K 5.3, creatinine 1.2, TGs 233, HDL 69, LDL 134 Labs (9/11): K 4.5, creatinine 1.2, LFTs normal, TSH normal, LDL 104, HDL 30 Labs (2/12): K 4.6, creatinine 1.2, AST 48, ALT 62, LDL 99, HDL 26, TSH normal  Labs (2/14): K 4.4, creatinine 1.1, LFTs normal, TSH normal, HDL 33, LDL 88 Labs (8/14): K 4.3, creatinine 1.1, LDL 103, HDL 27, TGs 266 Labs (8/15): K 3.9, creatinine 1.0, LDL 117, HDL 25, TGs 259, AST 40, ALT54  ECG: NSR, nonspecific T wave flattening  Allergies (verified):  1)  ! Pcn 2)  ! Zoloft 3)  ! Reglan  Past Medical History: 1. Hyperlipidemia: myalgias with atorvastatin and pravastatin. 2. Type 2 diabetes. 3. Gastroesophageal reflux disease. 4. Hypertension. 5. Chronic C-spine pain. 6. Colonic polyps. 7. Low back pain.  The patient is status post spinal fusion surgeries. 8. Hereditary hemochromatosis: Compound heterozygote.  Patient gets periodic phlebotomies 10.  Anxiety. 11. Chest pain:  Exercise/adenosine Myoview showed EF of 50% (3/10).  There was inferior hypokinesis.  On perfusion images, there was inferior scar and ischemia in the apical anterior septum and in the inferior wall.  This is suggestive of a coronary disease. LHC (3/10) showed only luminal irregularities in the coronaries with EF 55%, suggesting that myoview was a false positive, likely from diaphragmatic attenuation.   12. Multifocal atrial tachycardia/wandering atrial pacemaker.  Holter monitor in 5/11 showed frequent runs of symptomatic MAT with heart rate around 100.  Patient is now on amiodarone to try to suppress MAT.  PFTs (6/11): FVC 105%, FEV1 109%, TLC 112%, DLCO 87% (minimal obstructive defect).  LFTs stable on amiodarone.  Holter (11/12) with PACs, blocked PACs, short runs of atrial tachycardia 13. Cardiomyopathy: echo (5/11) showed EF 30% with diffuse hypokinesis, normal wall thickness, mildly decreased RV systolic function.  This may be a cardiomyopathy due to hemochromatosis versus tachy-mediated.  Unable to get cardiac MRI due to claustrophobia.  Echo (5/12): EF 55-60%, mild LAE.  58. Hypothyroidism  Family History: mother with colon polyps, MS Father with HTN, ETOH sister with MS No FH of Colon Cancer: Family History of Diabetes: Mother, PGM, PGF  Social History: disabled with back pain Married, lives in [place].  Former Smoker-quit around 2004 Alcohol use-yes-3-4 drinks per month 3 children Drug Use - yes - quit in 1980 Daily Caffeine Use-3 drinks daily Patient does not get regular exercise.   ROS: All systems reviewed and  negative except as per HPI.   Current Outpatient Prescriptions  Medication Sig Dispense Refill  . aspirin 81 MG EC tablet Take 81 mg by mouth daily.      . cetirizine (ZYRTEC) 10 MG tablet Take 1 tablet (10 mg total) by mouth as needed. 90 tablet 3  . diazepam (VALIUM) 5 MG tablet Take 1 tablet (5 mg total) by mouth daily as needed for  anxiety. 30 tablet 1  . fluticasone (FLONASE) 50 MCG/ACT nasal spray Place 2 sprays into the nose as needed. 16 g 5  . glipiZIDE (GLIPIZIDE XL) 5 MG 24 hr tablet Take 1 tablet (5 mg total) by mouth daily with breakfast. 90 tablet 3  . ibuprofen (ADVIL,MOTRIN) 400 MG tablet Take 1 tablet (400 mg total) by mouth every 6 (six) hours as needed. 60 tablet 1  . levothyroxine (SYNTHROID, LEVOTHROID) 25 MCG tablet Take 1 tablet (25 mcg total) by mouth daily. 90 tablet 3  . losartan (COZAAR) 50 MG tablet Take 1 tablet (50 mg total) by mouth daily. 90 tablet 3  . metFORMIN (GLUCOPHAGE-XR) 500 MG 24 hr tablet 3 tabs by mouth in the AM 270 tablet 3  . metoprolol succinate (TOPROL-XL) 100 MG 24 hr tablet TAKE ONE TABLET BY MOUTH TWICE DAILY 180 tablet 3  . pantoprazole (PROTONIX) 40 MG tablet Take 1 tablet (40 mg total) by mouth daily. 90 tablet 3   No current facility-administered medications for this visit.    BP 110/72 mmHg  Pulse 78  Ht 5' 10.5" (1.791 m)  Wt 229 lb 1.9 oz (103.928 kg)  BMI 32.40 kg/m2  SpO2 97% General:  Well developed, well nourished, in no acute distress. Neck:  Neck supple, no JVD. No masses, thyromegaly or abnormal cervical nodes. Lungs:  Clear bilaterally to auscultation and percussion. Heart:  Non-displaced PMI, chest non-tender; regular rate and rhythm, S1, S2 without murmurs, rubs or gallops. Carotid upstroke normal, no bruit.  Pedals normal pulses. No edema, no varicosities. Abdomen:  Bowel sounds positive; abdomen soft and non-tender without masses, organomegaly, or hernias noted. No hepatosplenomegaly. Extremities:  No clubbing or cyanosis. Neurologic:  Alert and oriented x 3. Psych:  Normal affect.  Assessment/Plan: 1. Rhythm: Patient has had multifocal atrial tachycardia and wandering atrial pacemaker noted by holter and ECG. Hemochromatosis with infiltration of the atria may be the cause of this arrhythmia. No atrial fibrillation noted so far. He had a significant  burden of MAT, so I put him on amiodarone and Toprol XL. This has resolved his palpitations.  At a prior appointment, I took him off the amiodarone but left on the Toprol XL.  He has had no tachypalpitations.  2. Cardiomyopathy: EF as low as 30% in the past with luminal irregularities in the coronaries. Cardiomyopathy was thought to be due to hemochromatosis versus tachycardia-mediated cardiomyopathy from frequent MAT. He has been treated medically with Toprol XL and lisinopril, and EF was improved to 55-60% on last echo in 5/12.  I am going to have him get a repeat echocardiogram to make sure that EF has remained normal.  3. Hyperlipidemia: He has not tolerated atorvastatin or pravastatin due to myalgias.    Loralie Champagne 09/02/2014

## 2014-09-05 ENCOUNTER — Ambulatory Visit (HOSPITAL_COMMUNITY): Payer: Commercial Managed Care - HMO | Attending: Internal Medicine | Admitting: Cardiology

## 2014-09-05 DIAGNOSIS — I10 Essential (primary) hypertension: Secondary | ICD-10-CM | POA: Insufficient documentation

## 2014-09-05 DIAGNOSIS — E785 Hyperlipidemia, unspecified: Secondary | ICD-10-CM | POA: Diagnosis not present

## 2014-09-05 DIAGNOSIS — I429 Cardiomyopathy, unspecified: Secondary | ICD-10-CM | POA: Diagnosis not present

## 2014-09-05 DIAGNOSIS — Z87891 Personal history of nicotine dependence: Secondary | ICD-10-CM | POA: Insufficient documentation

## 2014-09-05 DIAGNOSIS — E119 Type 2 diabetes mellitus without complications: Secondary | ICD-10-CM | POA: Insufficient documentation

## 2014-09-05 NOTE — Progress Notes (Signed)
Echo performed. 

## 2014-09-08 ENCOUNTER — Other Ambulatory Visit: Payer: Self-pay | Admitting: Internal Medicine

## 2014-09-21 LAB — HM DIABETES EYE EXAM

## 2014-10-20 ENCOUNTER — Other Ambulatory Visit (INDEPENDENT_AMBULATORY_CARE_PROVIDER_SITE_OTHER): Payer: Commercial Managed Care - HMO

## 2014-10-20 DIAGNOSIS — Z125 Encounter for screening for malignant neoplasm of prostate: Secondary | ICD-10-CM | POA: Diagnosis not present

## 2014-10-20 DIAGNOSIS — E785 Hyperlipidemia, unspecified: Secondary | ICD-10-CM | POA: Diagnosis not present

## 2014-10-20 DIAGNOSIS — E119 Type 2 diabetes mellitus without complications: Secondary | ICD-10-CM

## 2014-10-20 DIAGNOSIS — Z0000 Encounter for general adult medical examination without abnormal findings: Secondary | ICD-10-CM | POA: Diagnosis not present

## 2014-10-20 LAB — MICROALBUMIN / CREATININE URINE RATIO
Creatinine,U: 36.1 mg/dL
Microalb Creat Ratio: 1.9 mg/g (ref 0.0–30.0)
Microalb, Ur: <0.7 mg/dL (ref 0.0–1.9)

## 2014-10-20 LAB — BASIC METABOLIC PANEL
BUN: 12 mg/dL (ref 6–23)
CO2: 28 mEq/L (ref 19–32)
CREATININE: 1.15 mg/dL (ref 0.40–1.50)
Calcium: 9.3 mg/dL (ref 8.4–10.5)
Chloride: 102 mEq/L (ref 96–112)
GFR: 69.53 mL/min (ref >60.00)
Glucose, Bld: 192 mg/dL — ABNORMAL HIGH (ref 70–99)
POTASSIUM: 3.9 meq/L (ref 3.5–5.1)
Sodium: 137 mEq/L (ref 135–145)

## 2014-10-20 LAB — TSH: TSH: 3.94 u[IU]/mL (ref 0.35–4.50)

## 2014-10-20 LAB — URINALYSIS, ROUTINE W REFLEX MICROSCOPIC
Bilirubin Urine: NEGATIVE
Hgb urine dipstick: NEGATIVE
KETONES UR: NEGATIVE
LEUKOCYTES UA: NEGATIVE
NITRITE: NEGATIVE
Specific Gravity, Urine: <=1.005 — AB (ref 1.000–1.030)
TOTAL PROTEIN, URINE-UPE24: NEGATIVE
UROBILINOGEN UA: 0.2 (ref 0.0–1.0)
Urine Glucose: 500 — AB
pH: 6 (ref 5.0–8.0)

## 2014-10-20 LAB — CBC WITH DIFFERENTIAL/PLATELET
BASOS ABS: 0.1 10*3/uL (ref 0.0–0.1)
Basophils Relative: 0.6 % (ref 0.0–3.0)
EOS ABS: 0.2 10*3/uL (ref 0.0–0.7)
Eosinophils Relative: 2.9 % (ref 0.0–5.0)
HCT: 45.3 % (ref 39.0–52.0)
Hemoglobin: 15.7 g/dL (ref 13.0–17.0)
Lymphocytes Relative: 29.5 % (ref 12.0–46.0)
Lymphs Abs: 2.3 10*3/uL (ref 0.7–4.0)
MCHC: 34.6 g/dL (ref 30.0–36.0)
MCV: 88.5 fl (ref 78.0–100.0)
Monocytes Absolute: 0.6 10*3/uL (ref 0.1–1.0)
Monocytes Relative: 7.8 % (ref 3.0–12.0)
NEUTROS PCT: 59.2 % (ref 43.0–77.0)
Neutro Abs: 4.6 10*3/uL (ref 1.4–7.7)
PLATELETS: 210 10*3/uL (ref 150.0–400.0)
RBC: 5.12 Mil/uL (ref 4.22–5.81)
RDW: 13 % (ref 11.5–15.5)
WBC: 7.8 10*3/uL (ref 4.0–10.5)

## 2014-10-20 LAB — HEPATIC FUNCTION PANEL
ALT: 52 U/L (ref 0–53)
AST: 37 U/L (ref 0–37)
Albumin: 4.1 g/dL (ref 3.5–5.2)
Alkaline Phosphatase: 71 U/L (ref 39–117)
BILIRUBIN DIRECT: 0.1 mg/dL (ref 0.0–0.3)
BILIRUBIN TOTAL: 0.7 mg/dL (ref 0.2–1.2)
Total Protein: 7.1 g/dL (ref 6.0–8.3)

## 2014-10-20 LAB — LIPID PANEL
CHOL/HDL RATIO: 7
Cholesterol: 176 mg/dL (ref 0–200)
HDL: 25.1 mg/dL — ABNORMAL LOW (ref >39.00)
NonHDL: 150.9
TRIGLYCERIDES: 216 mg/dL — AB (ref 0.0–149.0)
VLDL: 43.2 mg/dL — ABNORMAL HIGH (ref 0.0–40.0)

## 2014-10-20 LAB — LDL CHOLESTEROL, DIRECT: Direct LDL: 115 mg/dL

## 2014-10-20 LAB — HEMOGLOBIN A1C: HEMOGLOBIN A1C: 7.4 % — AB (ref 4.6–6.5)

## 2014-10-20 LAB — PSA: PSA: 0.82 ng/mL (ref 0.10–4.00)

## 2014-10-26 ENCOUNTER — Ambulatory Visit (INDEPENDENT_AMBULATORY_CARE_PROVIDER_SITE_OTHER): Payer: Commercial Managed Care - HMO | Admitting: Internal Medicine

## 2014-10-26 ENCOUNTER — Encounter: Payer: Self-pay | Admitting: Internal Medicine

## 2014-10-26 VITALS — BP 132/90 | HR 71 | Temp 97.7°F | Ht 70.5 in | Wt 236.0 lb

## 2014-10-26 DIAGNOSIS — M25512 Pain in left shoulder: Secondary | ICD-10-CM

## 2014-10-26 DIAGNOSIS — Z23 Encounter for immunization: Secondary | ICD-10-CM | POA: Diagnosis not present

## 2014-10-26 DIAGNOSIS — M25562 Pain in left knee: Secondary | ICD-10-CM | POA: Diagnosis not present

## 2014-10-26 DIAGNOSIS — Z0000 Encounter for general adult medical examination without abnormal findings: Secondary | ICD-10-CM | POA: Diagnosis not present

## 2014-10-26 DIAGNOSIS — M25561 Pain in right knee: Secondary | ICD-10-CM | POA: Insufficient documentation

## 2014-10-26 DIAGNOSIS — E119 Type 2 diabetes mellitus without complications: Secondary | ICD-10-CM

## 2014-10-26 NOTE — Assessment & Plan Note (Signed)
C/w prob DJD, for sport med referral

## 2014-10-26 NOTE — Addendum Note (Signed)
Addended by: Townsend Roger D on: 10/26/2014 11:10 AM   Modules accepted: Orders

## 2014-10-26 NOTE — Assessment & Plan Note (Signed)
?   Rot cuff related vs other - for sport med referral

## 2014-10-26 NOTE — Patient Instructions (Addendum)
You had the new Prevnar pneumonia shot today  Please continue all other medications as before, and refills have been done if requested.  Please have the pharmacy call with any other refills you may need.  Please continue your efforts at being more active, low cholesterol diet, and weight control.  You are otherwise up to date with prevention measures today.  Please keep your appointments with your specialists as you may have planned  Your Lab work was OK today  You will be contacted regarding the referral for: Dr Tamala Julian (sports medicine) in this office about the knees and left shoulder  Please remember to sign up for MyChart if you have not done so, as this will be important to you in the future with finding out test results, communicating by private email, and scheduling acute appointments online when needed.  Please return in 6 months, or sooner if needed, with Lab testing done 3-5 days before

## 2014-10-26 NOTE — Progress Notes (Signed)
Pre visit review using our clinic review tool, if applicable. No additional management support is needed unless otherwise documented below in the visit note. 

## 2014-10-26 NOTE — Assessment & Plan Note (Signed)
stable overall by history and exam, recent data reviewed with pt, and pt to continue medical treatment as before,  to f/u any worsening symptoms or concerns Lab Results  Component Value Date   HGBA1C 7.4* 10/20/2014

## 2014-10-26 NOTE — Progress Notes (Signed)
Subjective:    Patient ID: Keith [name], male    DOB: 08/30/1956, 59 y.o.   MRN: [id number]  HPI  Here for wellness and f/u;  Overall doing ok;  Pt denies CP, worsening SOB, DOE, wheezing, orthopnea, PND, worsening LE edema, palpitations, dizziness or syncope.  Pt denies neurological change such as new headache, facial or extremity weakness.  Pt denies polydipsia, polyuria, or low sugar symptoms. Pt states overall good compliance with treatment and medications, good tolerability, and has been trying to follow lower cholesterol diet.  Pt denies worsening depressive symptoms, suicidal ideation or panic. No fever, night sweats, wt loss, loss of appetite, or other constitutional symptoms.  Pt states good ability with ADL's, has low fall risk, home safety reviewed and adequate, no other significant changes in hearing or vision, and only occasionally active with exercise. No current complaints. Stil sees GI with respect to hemochromatosis.  Does though have ongoing 6 mo worsening bilat knee pain , and more chronic but ? Worsening left shoulder pain as well, has seen Dr Jonni Sanger for shoulder in past.  Asks for referral to Dr Tamala Julian for knees and shoulder, also has some pain to ankles.  Past Medical History  Diagnosis Date  . Hyperlipidemia   . Diabetes mellitus   . GERD (gastroesophageal reflux disease)   . Hypertension   . Cervical spine pain     Chronic  . History of colonic polyps   . Low back pain     The pt is S/P spinal fusion surgeries  . Hereditary hemochromatosis     Compound heterozygote. Pt gets periodic phlebotomies  . Anxiety   . Chest pain     Exercise/adenosine Myoview showed EF 50% (3/10). Inferior hypokinesis. Perfusion images, inferior scar and ischemia in the apical anterior septum and in teh inferior wall . Suggestive of a CD. LHC (3/10) showed only lu minal irregulatrities in the coronaries w/ EF 55%, suggesting that myoview was a false positive, likely from diaphragmatic  attnuation.  . Multifocal atrial tachycardia     Wandering atrial pacemaker. Holter monior in 5/11 showed frequent runs of symtomatic MAT with heart rate around 100. Pt is now  on amiodarone to try to suppress MAT. PFTs (6/11): FVC 105%, FEV1 109%, TLC 112%, DLCO 87% (minimal obstructive effect). LFTs stable on amiodarone  . Cardiomyopathy     Echo (5/11) showed EF 30% with diffuse hypokinesis, normal wall thickness, mildly decreased RV systolic function. This may be a cardiomyopathy due to hemochromatosis versus tachy-mediated. Unable to get cardiac MRI due to claustrophobia.  . Peptic ulcer disease   . Fatty liver   . Arthritis   . Thyroid nodule    Past Surgical History  Procedure Laterality Date  . Inguinal hernia repair    . Back surgery  1991    Lumbar  . Tonsillectomy      reports that he quit smoking about 11 years ago. He has never used smokeless tobacco. He reports that he drinks about 0.5 oz of alcohol per week. He reports that he uses illicit drugs. family history includes Alcohol abuse in his father; Colitis in his mother; Colon polyps in his mother; Diabetes in his mother, paternal grandfather, and paternal grandmother; Hypertension in his father; Multiple sclerosis in his mother and sister. There is no history of Colon cancer. Allergies  Allergen Reactions  . Lipitor [Atorvastatin]     myalgias  . Metoclopramide Hcl   . Penicillins   . Sertraline Hcl  Current Outpatient Prescriptions on File Prior to Visit  Medication Sig Dispense Refill  . aspirin 81 MG EC tablet Take 81 mg by mouth daily.      . cetirizine (ZYRTEC) 10 MG tablet Take 1 tablet (10 mg total) by mouth as needed. 90 tablet 3  . diazepam (VALIUM) 5 MG tablet Take 1 tablet (5 mg total) by mouth daily as needed for anxiety. 30 tablet 1  . fluticasone (FLONASE) 50 MCG/ACT nasal spray Place 2 sprays into the nose as needed. 16 g 5  . glipiZIDE (GLIPIZIDE XL) 5 MG 24 hr tablet Take 1 tablet (5 mg total) by  mouth daily with breakfast. 90 tablet 3  . ibuprofen (ADVIL,MOTRIN) 400 MG tablet Take 1 tablet (400 mg total) by mouth every 6 (six) hours as needed. 60 tablet 1  . levothyroxine (SYNTHROID, LEVOTHROID) 25 MCG tablet Take 1 tablet (25 mcg total) by mouth daily. 90 tablet 3  . losartan (COZAAR) 50 MG tablet Take 1 tablet (50 mg total) by mouth daily. 90 tablet 3  . metFORMIN (GLUCOPHAGE-XR) 500 MG 24 hr tablet Take 3 tablets (1,500 mg total) by mouth daily with breakfast. 270 tablet 3  . metoprolol succinate (TOPROL-XL) 100 MG 24 hr tablet Take 1 tablet (100 mg total) by mouth 2 (two) times daily. 180 tablet 3  . pantoprazole (PROTONIX) 40 MG tablet Take 1 tablet (40 mg total) by mouth daily. 90 tablet 3   No current facility-administered medications on file prior to visit.   Review of Systems Constitutional: Negative for increased diaphoresis, other activity, appetite or other siginficant weight change  HENT: Negative for worsening hearing loss, ear pain, facial swelling, mouth sores and neck stiffness.   Eyes: Negative for other worsening pain, redness or visual disturbance.  Respiratory: Negative for shortness of breath and wheezing.   Cardiovascular: Negative for chest pain and palpitations.  Gastrointestinal: Negative for diarrhea, blood in stool, abdominal distention or other pain Genitourinary: Negative for hematuria, flank pain or change in urine volume.  Musculoskeletal: Negative for myalgias or other joint complaints.  Skin: Negative for color change and wound.  Neurological: Negative for syncope and numbness. other than noted Hematological: Negative for adenopathy. or other swelling Psychiatric/Behavioral: Negative for hallucinations, self-injury, decreased concentration or other worsening agitation.      Objective:   Physical Exam BP 132/90 mmHg  Pulse 71  Temp(Src) 97.7 F (36.5 C) (Oral)  Ht 5' 10.5" (1.791 m)  Wt 236 lb (107.049 kg)  BMI 33.37 kg/m2 VS noted,    Constitutional: Pt is oriented to person, place, and time. Appears well-developed and well-nourished.  Head: Normocephalic and atraumatic.  Right Ear: External ear normal.  Left Ear: External ear normal.  Nose: Nose normal.  Mouth/Throat: Oropharynx is clear and moist.  Eyes: Conjunctivae and EOM are normal. Pupils are equal, round, and reactive to light.  Neck: Normal range of motion. Neck supple. No JVD present. No tracheal deviation present.  Cardiovascular: Normal rate, regular rhythm, normal heart sounds and intact distal pulses.   Pulmonary/Chest: Effort normal and breath sounds without rales or wheezing  Abdominal: Soft. Bowel sounds are normal. NT. No HSM  Musculoskeletal: Normal range of motion. Exhibits no edema.  Lymphadenopathy:  Has no cervical adenopathy.  Neurological: Pt is alert and oriented to person, place, and time. Pt has normal reflexes. No cranial nerve deficit. Motor grossly intact Skin: Skin is warm and dry. No rash noted.  Psychiatric:  Has normal mood and affect. Behavior is normal.  Has bilat knee crepitus left > right       Assessment & Plan:

## 2014-10-26 NOTE — Assessment & Plan Note (Signed)

## 2014-11-01 ENCOUNTER — Ambulatory Visit (INDEPENDENT_AMBULATORY_CARE_PROVIDER_SITE_OTHER): Payer: Commercial Managed Care - HMO | Admitting: Family Medicine

## 2014-11-01 ENCOUNTER — Encounter: Payer: Self-pay | Admitting: Family Medicine

## 2014-11-01 ENCOUNTER — Other Ambulatory Visit: Payer: Commercial Managed Care - HMO

## 2014-11-01 ENCOUNTER — Other Ambulatory Visit (INDEPENDENT_AMBULATORY_CARE_PROVIDER_SITE_OTHER): Payer: Commercial Managed Care - HMO

## 2014-11-01 VITALS — BP 126/84 | HR 71 | Ht 70.5 in | Wt 233.0 lb

## 2014-11-01 DIAGNOSIS — M25512 Pain in left shoulder: Secondary | ICD-10-CM

## 2014-11-01 DIAGNOSIS — M2550 Pain in unspecified joint: Secondary | ICD-10-CM | POA: Diagnosis not present

## 2014-11-01 DIAGNOSIS — M7552 Bursitis of left shoulder: Secondary | ICD-10-CM | POA: Diagnosis not present

## 2014-11-01 DIAGNOSIS — M7550 Bursitis of unspecified shoulder: Secondary | ICD-10-CM | POA: Insufficient documentation

## 2014-11-01 LAB — C-REACTIVE PROTEIN: CRP: 0.6 mg/dL (ref 0.5–20.0)

## 2014-11-01 LAB — VITAMIN D 25 HYDROXY (VIT D DEFICIENCY, FRACTURES): VITD: 23.54 ng/mL — ABNORMAL LOW (ref 30.00–100.00)

## 2014-11-01 LAB — SEDIMENTATION RATE: SED RATE: 17 mm/h (ref 0–22)

## 2014-11-01 LAB — URIC ACID: Uric Acid, Serum: 6.2 mg/dL (ref 4.0–7.8)

## 2014-11-01 LAB — FERRITIN: FERRITIN: 72.4 ng/mL (ref 22.0–322.0)

## 2014-11-01 NOTE — Assessment & Plan Note (Signed)
She is having multiple joint plane that I think that this could be contributing to some of the discomfort he is having. I do think that there could be an underlying cause. Patient's past medical history significant for hemachromatosis as well as back surgery do increase the risk of a systemic findings. Vision has had hypothyroidism due to amiodarone as well. Patient is no longer on amiodarone per his medication list. I do feel that further workup for any other cause of the arthralgias is necessary. Patient will have his uric acid, vitamin D, as well as ANA to rule out any other systemic findings. We will get an ESR to see if there is any inflammatory process that could also be contributing. We discussed over-the-counter natural supplementations that would be beneficial to decrease inflammation naturally. We discussed potential diet changes as well. Patient and will follow-up with me again in 2-3 weeks.

## 2014-11-01 NOTE — Progress Notes (Signed)
Pre visit review using our clinic review tool, if applicable. No additional management support is needed unless otherwise documented below in the visit note. 

## 2014-11-01 NOTE — Patient Instructions (Addendum)
Good to meet you Ice 20 minutes 2 times daily. Usually after activity and before bed. We will check labs to see if we can figure out why so much pain in the joints.  I will cal lyou with the results Try the duexis 3 times daily for 6 days Turmeric 500mg  twice daily Get moving!, more exercises the better.  Fish oil 2 grams daily  See me again in 2-3 weeks.
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
## 2014-11-02 LAB — ANTI-NUCLEAR AB-TITER (ANA TITER): ANA Titer 1: 1:160 {titer} — ABNORMAL HIGH

## 2014-11-02 LAB — ANA: ANA: POSITIVE — AB

## 2014-11-03 ENCOUNTER — Other Ambulatory Visit: Payer: Self-pay | Admitting: *Deleted

## 2014-11-03 ENCOUNTER — Telehealth: Payer: Self-pay | Admitting: Family Medicine

## 2014-11-03 DIAGNOSIS — M069 Rheumatoid arthritis, unspecified: Secondary | ICD-10-CM

## 2014-11-03 NOTE — Telephone Encounter (Signed)
Patient was called and told about the positive ANA with a positive titer. I do feel that referral to a rheumatologist as necessary. Patient will be referred to Dr. Amil Amen. Patient verbalized understanding and will call if he has any questions.

## 2014-11-29 ENCOUNTER — Ambulatory Visit: Payer: Commercial Managed Care - HMO | Admitting: Family Medicine

## 2014-11-29 DIAGNOSIS — M79643 Pain in unspecified hand: Secondary | ICD-10-CM | POA: Diagnosis not present

## 2014-11-29 DIAGNOSIS — R7989 Other specified abnormal findings of blood chemistry: Secondary | ICD-10-CM | POA: Diagnosis not present

## 2014-11-29 DIAGNOSIS — M2550 Pain in unspecified joint: Secondary | ICD-10-CM | POA: Diagnosis not present

## 2014-12-29 ENCOUNTER — Telehealth: Payer: Self-pay | Admitting: Internal Medicine

## 2014-12-29 NOTE — Telephone Encounter (Signed)
She just received a fax certifying physician for diabetic shoes and he chosen the amputee reason and he should of circle option B.

## 2015-01-01 NOTE — Telephone Encounter (Signed)
I am not sure how to reply, thanks

## 2015-01-02 NOTE — Telephone Encounter (Signed)
LMOVM for Hilda Blades to call back

## 2015-01-29 DIAGNOSIS — M1990 Unspecified osteoarthritis, unspecified site: Secondary | ICD-10-CM | POA: Diagnosis not present

## 2015-01-29 DIAGNOSIS — R7989 Other specified abnormal findings of blood chemistry: Secondary | ICD-10-CM | POA: Diagnosis not present

## 2015-01-29 DIAGNOSIS — M2550 Pain in unspecified joint: Secondary | ICD-10-CM | POA: Diagnosis not present

## 2015-01-29 DIAGNOSIS — M79643 Pain in unspecified hand: Secondary | ICD-10-CM | POA: Diagnosis not present

## 2015-04-04 ENCOUNTER — Other Ambulatory Visit: Payer: Self-pay | Admitting: Internal Medicine

## 2015-04-19 ENCOUNTER — Ambulatory Visit (INDEPENDENT_AMBULATORY_CARE_PROVIDER_SITE_OTHER): Payer: Commercial Managed Care - HMO | Admitting: Family Medicine

## 2015-04-19 VITALS — BP 130/88 | HR 74 | Temp 98.3°F | Resp 20 | Ht 70.0 in | Wt 232.1 lb

## 2015-04-19 DIAGNOSIS — R51 Headache: Secondary | ICD-10-CM | POA: Diagnosis not present

## 2015-04-19 DIAGNOSIS — R21 Rash and other nonspecific skin eruption: Secondary | ICD-10-CM | POA: Diagnosis not present

## 2015-04-19 DIAGNOSIS — R519 Headache, unspecified: Secondary | ICD-10-CM

## 2015-04-19 DIAGNOSIS — B029 Zoster without complications: Secondary | ICD-10-CM | POA: Diagnosis not present

## 2015-04-19 MED ORDER — VALACYCLOVIR HCL 1 G PO TABS: 1000.0000 mg | ORAL_TABLET | Freq: Three times a day (TID) | ORAL | Status: DC

## 2015-04-19 MED ORDER — HYDROCODONE-ACETAMINOPHEN 5-325 MG PO TABS: 1.0000 | ORAL_TABLET | Freq: Three times a day (TID) | ORAL | Status: DC | PRN

## 2015-04-19 NOTE — Progress Notes (Signed)
Chief Complaint:  Chief Complaint  Patient presents with  . Headache    Pain on Left Side of Head    HPI: Keith [name] is a 59 y.o. male who reports to [facility] today complaining of 4-5 days his head is sore and along his temple and in his ears and it hurts. He thought it was getting better but this evening it started. He is not having any rashes. IT is a sharp stanbing pain, it feels like a toothache and he has a stabbing pain on top of it. He took zyrtec and ibuprofen without releif, even flonase. Minimal blurred vision. No history of migraines or headaches. No confusion. No confusion or slurred speech. He doe snto think it is a headaches . HE feels sore to the touch. He has never been vaccinated for shingles. He has DM but controlled  Past Medical History  Diagnosis Date  . Hyperlipidemia   . Diabetes mellitus   . GERD (gastroesophageal reflux disease)   . Hypertension   . Cervical spine pain     Chronic  . History of colonic polyps   . Low back pain     The pt is S/P spinal fusion surgeries  . Hereditary hemochromatosis     Compound heterozygote. Pt gets periodic phlebotomies  . Anxiety   . Chest pain     Exercise/adenosine Myoview showed EF 50% (3/10). Inferior hypokinesis. Perfusion images, inferior scar and ischemia in the apical anterior septum and in teh inferior wall . Suggestive of a CD. LHC (3/10) showed only lu minal irregulatrities in the coronaries w/ EF 55%, suggesting that myoview was a false positive, likely from diaphragmatic attnuation.  . Multifocal atrial tachycardia     Wandering atrial pacemaker. Holter monior in 5/11 showed frequent runs of symtomatic MAT with heart rate around 100. Pt is now  on amiodarone to try to suppress MAT. PFTs (6/11): FVC 105%, FEV1 109%, TLC 112%, DLCO 87% (minimal obstructive effect). LFTs stable on amiodarone  . Cardiomyopathy     Echo (5/11) showed EF 30% with diffuse hypokinesis, normal wall thickness, mildly decreased RV  systolic function. This may be a cardiomyopathy due to hemochromatosis versus tachy-mediated. Unable to get cardiac MRI due to claustrophobia.  . Peptic ulcer disease   . Fatty liver   . Arthritis   . Thyroid nodule   . Heart murmur    Past Surgical History  Procedure Laterality Date  . Inguinal hernia repair    . Back surgery  1991    Lumbar  . Tonsillectomy    . Spine surgery     History   Social History  . Marital Status: Married    Spouse Name: [name]  . Number of Children: 3  . Years of Education: [name]   Occupational History  . Disabled    Social History Main Topics  . Smoking status: Former Smoker    Quit date: 09/16/2003  . Smokeless tobacco: Never Used  . Alcohol Use: 0.5 oz/week    1 Standard drinks or equivalent per week     Comment: 3-4 drinks per month  . Drug Use: Yes     Comment: Quit in 1980  . Sexual Activity: Not on file   Other Topics Concern  . None   Social History Narrative   Married   Lives in [place]   3 children   Daily caffeine use - 3 drinks daily   Does not get regular exercise  Secretary/administrator.   Family History  Problem Relation Age of Onset  . Colon polyps Mother   . Multiple sclerosis Mother   . Diabetes Mother   . Colitis Mother   . Hypertension Father   . Alcohol abuse Father   . Multiple sclerosis Sister   . Diabetes Paternal Grandmother   . Diabetes Paternal Grandfather   . Colon cancer Neg Hx    Allergies  Allergen Reactions  . Lipitor [Atorvastatin]     myalgias  . Metoclopramide Hcl   . Penicillins   . Sertraline Hcl    Prior to Admission medications   Medication Sig Start Date End Date Taking? Authorizing Provider  aspirin 81 MG EC tablet Take 81 mg by mouth daily.     Yes Historical Provider, MD  cetirizine (ZYRTEC) 10 MG tablet Take 1 tablet (10 mg total) by mouth as needed. 11/12/12  Yes Biagio Borg, MD  diazepam (VALIUM) 5 MG tablet Take 1 tablet (5 mg total) by mouth daily as  needed for anxiety. 11/03/13  Yes Biagio Borg, MD  fluticasone [facility]) 50 MCG/ACT nasal spray Place 2 sprays into the nose as needed. 11/12/12  Yes Biagio Borg, MD  glipiZIDE (GLUCOTROL XL) 5 MG 24 hr tablet TAKE 1 TABLET BY MOUTH DAILY WITH BREAKFAST. 04/04/15  Yes Biagio Borg, MD  ibuprofen (ADVIL,MOTRIN) 400 MG tablet Take 1 tablet (400 mg total) by mouth every 6 (six) hours as needed. 10/26/13  Yes Biagio Borg, MD  levothyroxine (SYNTHROID, LEVOTHROID) 25 MCG tablet Take 1 tablet (25 mcg total) by mouth daily. 09/11/14  Yes Biagio Borg, MD  losartan (COZAAR) 50 MG tablet TAKE 1 TABLET BY MOUTH DAILY. 04/04/15  Yes Biagio Borg, MD  metFORMIN (GLUCOPHAGE-XR) 500 MG 24 hr tablet Take 3 tablets (1,500 mg total) by mouth daily with breakfast. 09/11/14  Yes Biagio Borg, MD  metoprolol succinate (TOPROL-XL) 100 MG 24 hr tablet Take 1 tablet (100 mg total) by mouth 2 (two) times daily. 09/11/14  Yes Biagio Borg, MD  pantoprazole (PROTONIX) 40 MG tablet Take 1 tablet (40 mg total) by mouth daily. 09/11/14  Yes Biagio Borg, MD     ROS: The patient denies fevers, chills, night sweats, unintentional weight loss, chest pain, palpitations, wheezing, dyspnea on exertion, nausea, vomiting, abdominal pain, dysuria, hematuria, melena  All other systems have been reviewed and were otherwise negative with the exception of those mentioned in the HPI and as above.    PHYSICAL EXAM: Filed Vitals:   04/19/15 1833  BP: 130/88  Pulse: 74  Temp: 98.3 F (36.8 C)  Resp: 20   Body mass index is 33.31 kg/(m^2).   General: Alert, no acute distress HEENT:  Normocephalic, atraumatic, oropharynx patent. EOMI, PERRLA, fundoscopic exam, tm normal Cardiovascular:  Regular rate and rhythm, no rubs murmurs or gallops.  No Carotid bruits, radial pulse intact. No pedal edema.  Respiratory: Clear to auscultation bilaterally.  No wheezes, rales, or rhonchi.  No cyanosis, no use of accessory musculature Abdominal: No  organomegaly, abdomen is soft and non-tender, positive bowel sounds. No masses. Skin: + shingles rash on left side of face and head Neurologic: Facial musculature symmetric, UE and LE strength normal, no CVA like sxs/appearance Psychiatric: Patient acts appropriately throughout our interaction. Lymphatic: No cervical or submandibular lymphadenopathy Musculoskeletal: Gait intact. No edema, tenderness   LABS: Results for orders placed or performed in visit on 11/01/14  Uric acid  Result Value Ref Range   Uric Acid, Serum 6.2 4.0 - 7.8 mg/dL  Vitamin D (25 hydroxy)  Result Value Ref Range   VITD 23.54 (L) 30.00 - 100.00 ng/mL  Ferritin  Result Value Ref Range   Ferritin 72.4 22.0 - 322.0 ng/mL  Sed Rate (ESR)  Result Value Ref Range   Sed Rate 17 0 - 22 mm/hr  C-reactive protein  Result Value Ref Range   CRP 0.6 0.5 - 20.0 mg/dL  ANA  Result Value Ref Range   Anit Nuclear Antibody(ANA) POS (A) NEGATIVE  Anti-nuclear ab-titer (ANA titer)  Result Value Ref Range   ANA Titer 1 1:160 (H) <1:40     ANA Pattern 1 HOMOGENOUS (A)      EKG/XRAY:   Primary read interpreted by Dr. Marin Comment at [facility].   ASSESSMENT/PLAN: Encounter Diagnoses  Name Primary?  . Shingles Yes  . Rash and nonspecific skin eruption   . Acute intractable headache, unspecified headache type    I have given him valtrex for possible shingles rash onhis head, please see picture He has been given precautions HE was also given norco for pain  Push fluids If worse return to office  Gross sideeffects, risk and benefits, and alternatives of medications d/w patient. Patient is aware that all medications have potential sideeffects and we are unable to predict every sideeffect or drug-drug interaction that may occur.  Thao Le DO  04/19/2015 7:04 PM

## 2015-04-19 NOTE — Patient Instructions (Signed)

## 2015-04-21 ENCOUNTER — Ambulatory Visit (INDEPENDENT_AMBULATORY_CARE_PROVIDER_SITE_OTHER): Payer: Commercial Managed Care - HMO | Admitting: Family Medicine

## 2015-04-21 VITALS — BP 140/100 | HR 70 | Temp 97.8°F | Ht 70.0 in | Wt 234.0 lb

## 2015-04-21 DIAGNOSIS — R21 Rash and other nonspecific skin eruption: Secondary | ICD-10-CM | POA: Diagnosis not present

## 2015-04-21 DIAGNOSIS — H9202 Otalgia, left ear: Secondary | ICD-10-CM | POA: Diagnosis not present

## 2015-04-21 DIAGNOSIS — B029 Zoster without complications: Secondary | ICD-10-CM | POA: Diagnosis not present

## 2015-04-21 MED ORDER — OXYCODONE-ACETAMINOPHEN 7.5-325 MG PO TABS: 1.0000 | ORAL_TABLET | Freq: Three times a day (TID) | ORAL | Status: DC | PRN

## 2015-04-21 MED ORDER — GABAPENTIN 100 MG PO CAPS: 100.0000 mg | ORAL_CAPSULE | Freq: Three times a day (TID) | ORAL | Status: DC

## 2015-04-21 NOTE — Progress Notes (Signed)
Chief Complaint:  Chief Complaint  Patient presents with  . Herpes Zoster    Shingles no better (head area)-was here on Thursday    HPI: Keith [name] is a 59 y.o. male who reports to [facility] today complaining of head pain is "severe" even with norco, his rash ahs gotten wore, there are now blisters, still on one side. He has been taking norco every 4 ours and then 1 /2 tab 2 hours later to help. Still taking ibupofren. Eating an ddrinking ok, era pain, left more than right no eye pain, rash has gone down to eye. Has some pain in left ear, has tried some drops for it.  Past Medical History  Diagnosis Date  . Hyperlipidemia   . Diabetes mellitus   . GERD (gastroesophageal reflux disease)   . Hypertension   . Cervical spine pain     Chronic  . History of colonic polyps   . Low back pain     The pt is S/P spinal fusion surgeries  . Hereditary hemochromatosis     Compound heterozygote. Pt gets periodic phlebotomies  . Anxiety   . Chest pain     Exercise/adenosine Myoview showed EF 50% (3/10). Inferior hypokinesis. Perfusion images, inferior scar and ischemia in the apical anterior septum and in teh inferior wall . Suggestive of a CD. LHC (3/10) showed only lu minal irregulatrities in the coronaries w/ EF 55%, suggesting that myoview was a false positive, likely from diaphragmatic attnuation.  . Multifocal atrial tachycardia     Wandering atrial pacemaker. Holter monior in 5/11 showed frequent runs of symtomatic MAT with heart rate around 100. Pt is now  on amiodarone to try to suppress MAT. PFTs (6/11): FVC 105%, FEV1 109%, TLC 112%, DLCO 87% (minimal obstructive effect). LFTs stable on amiodarone  . Cardiomyopathy     Echo (5/11) showed EF 30% with diffuse hypokinesis, normal wall thickness, mildly decreased RV systolic function. This may be a cardiomyopathy due to hemochromatosis versus tachy-mediated. Unable to get cardiac MRI due to claustrophobia.  . Peptic ulcer disease     . Fatty liver   . Arthritis   . Thyroid nodule   . Heart murmur    Past Surgical History  Procedure Laterality Date  . Inguinal hernia repair    . Back surgery  1991    Lumbar  . Tonsillectomy    . Spine surgery     History   Social History  . Marital Status: Married    Spouse Name: [name]  . Number of Children: 3  . Years of Education: [name]   Occupational History  . Disabled    Social History Main Topics  . Smoking status: Former Smoker    Quit date: 09/16/2003  . Smokeless tobacco: Never Used  . Alcohol Use: 0.5 oz/week    1 Standard drinks or equivalent per week     Comment: 3-4 drinks per month  . Drug Use: Yes     Comment: Quit in 1980  . Sexual Activity: Not on file   Other Topics Concern  . None   Social History Narrative   Married   Lives in [place]   3 children   Daily caffeine use - 3 drinks daily   Does not get regular exercise      [employer] on file.   Family History  Problem Relation Age of Onset  . Colon polyps Mother   . Multiple sclerosis Mother   .  Diabetes Mother   . Colitis Mother   . Hypertension Father   . Alcohol abuse Father   . Multiple sclerosis Sister   . Diabetes Paternal Grandmother   . Diabetes Paternal Grandfather   . Colon cancer Neg Hx    Allergies  Allergen Reactions  . Lipitor [Atorvastatin]     myalgias  . Metoclopramide Hcl   . Penicillins   . Sertraline Hcl    Prior to Admission medications   Medication Sig Start Date End Date Taking? Authorizing Provider  aspirin 81 MG EC tablet Take 81 mg by mouth daily.     Yes Historical Provider, MD  cetirizine (ZYRTEC) 10 MG tablet Take 1 tablet (10 mg total) by mouth as needed. 11/12/12  Yes Biagio Borg, MD  Cholecalciferol (VITAMIN D-3 PO) Take by mouth.   Yes Historical Provider, MD  diazepam (VALIUM) 5 MG tablet Take 1 tablet (5 mg total) by mouth daily as needed for anxiety. 11/03/13  Yes Biagio Borg, MD  fluticasone [facility]) 50 MCG/ACT nasal  spray Place 2 sprays into the nose as needed. 11/12/12  Yes Biagio Borg, MD  glipiZIDE (GLUCOTROL XL) 5 MG 24 hr tablet TAKE 1 TABLET BY MOUTH DAILY WITH BREAKFAST. 04/04/15  Yes Biagio Borg, MD  glucosamine-chondroitin 500-400 MG tablet Take 1 tablet by mouth 3 (three) times daily.   Yes Historical Provider, MD  HYDROcodone-acetaminophen (NORCO) 5-325 MG per tablet Take 1 tablet by mouth every 8 (eight) hours as needed for moderate pain (Take with stool softener). 04/19/15  Yes Cephus Tupy P Endrit Gittins, DO  ibuprofen (ADVIL,MOTRIN) 400 MG tablet Take 1 tablet (400 mg total) by mouth every 6 (six) hours as needed. 10/26/13  Yes Biagio Borg, MD  levothyroxine (SYNTHROID, LEVOTHROID) 25 MCG tablet Take 1 tablet (25 mcg total) by mouth daily. 09/11/14  Yes Biagio Borg, MD  losartan (COZAAR) 50 MG tablet TAKE 1 TABLET BY MOUTH DAILY. 04/04/15  Yes Biagio Borg, MD  metFORMIN (GLUCOPHAGE-XR) 500 MG 24 hr tablet Take 3 tablets (1,500 mg total) by mouth daily with breakfast. 09/11/14  Yes Biagio Borg, MD  metoprolol succinate (TOPROL-XL) 100 MG 24 hr tablet Take 1 tablet (100 mg total) by mouth 2 (two) times daily. 09/11/14  Yes Biagio Borg, MD  Omega-3 Fatty Acids (FISH OIL PO) Take by mouth.   Yes Historical Provider, MD  pantoprazole (PROTONIX) 40 MG tablet Take 1 tablet (40 mg total) by mouth daily. 09/11/14  Yes Biagio Borg, MD  TURMERIC PO Take by mouth.   Yes Historical Provider, MD  valACYclovir (VALTREX) 1000 MG tablet Take 1 tablet (1,000 mg total) by mouth 3 (three) times daily. 04/19/15  Yes Joel Cowin P Najae Filsaime, DO     ROS: The patient denies fevers, chills, night sweats, unintentional weight loss, chest pain, palpitations, wheezing, dyspnea on exertion, nausea, vomiting, abdominal pain, dysuria, hematuria, melena  All other systems have been reviewed and were otherwise negative with the exception of those mentioned in the HPI and as above.    PHYSICAL EXAM: Filed Vitals:   04/21/15 1055  BP: 140/100  Pulse: 70    Temp: 97.8 F (36.6 C)   Body mass index is 33.58 kg/(m^2).   General: Alert, no acute distress HEENT:  Normocephalic, atraumatic, oropharynx patent. EOMI, PERRLA Tm bialterally looks like there is ome white dust like particles on them, ? Hydrogen peroside residue from ear drops or ? Earl candida Cardiovascular:  Regular rate and rhythm,  no rubs murmurs or gallops.  No Carotid bruits, radial pulse intact. No pedal edema.  Respiratory: Clear to auscultation bilaterally.  No wheezes, rales, or rhonchi.  No cyanosis, no use of accessory musculature Abdominal: No organomegaly, abdomen is soft and non-tender, positive bowel sounds. No masses. Skin: + shingles rash, on left side of face.  Neurologic: Facial musculature symmetric. Psychiatric: Patient acts appropriately throughout our interaction. Lymphatic: No cervical or submandibular lymphadenopathy Musculoskeletal: Gait intact. No edema, tenderness   LABS: Results for orders placed or performed in visit on 11/01/14  Uric acid  Result Value Ref Range   Uric Acid, Serum 6.2 4.0 - 7.8 mg/dL  Vitamin D (25 hydroxy)  Result Value Ref Range   VITD 23.54 (L) 30.00 - 100.00 ng/mL  Ferritin  Result Value Ref Range   Ferritin 72.4 22.0 - 322.0 ng/mL  Sed Rate (ESR)  Result Value Ref Range   Sed Rate 17 0 - 22 mm/hr  C-reactive protein  Result Value Ref Range   CRP 0.6 0.5 - 20.0 mg/dL  ANA  Result Value Ref Range   Anit Nuclear Antibody(ANA) POS (A) NEGATIVE  Anti-nuclear ab-titer (ANA titer)  Result Value Ref Range   ANA Titer 1 1:160 (H) <1:40     ANA Pattern 1 HOMOGENOUS (A)      EKG/XRAY:   Primary read interpreted by Dr. Marin Comment at [facility].   ASSESSMENT/PLAN: Encounter Diagnoses  Name Primary?  . Shingles Yes  . Rash and nonspecific skin eruption   . Otalgia of left ear    Will change pain meds from norco to percocet Rx gabapentin Refer to optho since getting closer to eye. Monitor ear sxs Fu prn   Gross sideeffects,  risk and benefits, and alternatives of medications d/w patient. Patient is aware that all medications have potential sideeffects and we are unable to predict every sideeffect or drug-drug interaction that may occur.  Mardene Lessig DO  04/21/2015 12:06 PM

## 2015-04-21 NOTE — Patient Instructions (Signed)
Postherpetic Neuralgia Postherpetic neuralgia (PHN) is nerve pain that occurs after a shingles infection. Shingles is a painful rash that appears on one side of the body, usually on your trunk or face. Shingles is caused by the varicella-zoster virus. This is the same virus that causes chickenpox. In people who have had chickenpox, the virus can resurface years later and cause shingles. You may have PHN if you continue to have pain for 3 months after your shingles rash has gone away. PHN appears in the same area where you had the shingles rash. For most people, PHN goes away within 1 year.  Getting a vaccination for shingles can prevent PHN. This vaccine is recommended for people older than 50. It may prevent shingles and may also lower your risk of PHN if you do get shingles. CAUSES PHN is caused by damage to your nerves from the varicella-zoster virus. This damage makes your nerves overly sensitive.  RISK FACTORS Aging is the biggest risk factor for developing PHN. Most people who get PHN are older than 85. Other risk factors include:  Having very bad pain before your shingles rash starts.  Having a very bad rash.  Having shingles in the nerve that supplies your face and eye (trigeminal nerve). SIGNS AND SYMPTOMS Pain is the main symptom of PHN. The pain is often very bad and may be described as stabbing, burning, or feeling like an electric shock. The pain may come and go or may be there all the time. Pain may be triggered by light touches on the skin or changes in temperature. You may have itching along with the pain. DIAGNOSIS  Your health care provider may diagnose PHN based on your symptoms and your history of shingles. Lab studies and other diagnostic tests are usually not needed. TREATMENT  There is no cure for PHN. Treatment for PHN will focus on pain relief. Over-the-counter pain relievers do not usually relieve PHN pain. You may need to work with a pain specialist. Treatment may  include:  Antidepressant medicines to help with pain and improve sleep.  Antiseizure medicines to relieve nerve pain.  Strong pain relievers (opioids).  A numbing patch worn on the skin (lidocaine patch). HOME CARE INSTRUCTIONS It may take a long time to recover from PHN. Work closely with your health care provider, and have a good support system at home.   Take all medicines as directed by your health care provider.  Wear loose, comfortable clothing.  Cover sensitive areas with a dressing to reduce friction from clothing rubbing on the area.  If cold does not make your pain worse, try applying a cool compress or cooling gel pack to the area.  Talk to your health care provider if you feel depressed or desperate. Living with long-term pain can be depressing. SEEK MEDICAL CARE IF:  Your medicine is not helping.  You are struggling to manage your pain at home. Document Released: 11/22/2002 Document Revised: 01/16/2014 Document Reviewed: 08/23/2013 [facility] Patient Information 2015 [place], [place]. This information is not intended to replace advice given to you by your health care provider. Make sure you discuss any questions you have with your health care provider.   Gabapentin capsules or tablets What is this medicine? GABAPENTIN (GA ba pen tin) is used to control partial seizures in adults with epilepsy. It is also used to treat certain types of nerve pain. This medicine may be used for other purposes; ask your health care provider or pharmacist if you have questions. COMMON BRAND NAME(S):  Orpha Bur, Neurontin What should I tell my health care provider before I take this medicine? They need to know if you have any of these conditions: -kidney disease -suicidal thoughts, plans, or attempt; a previous suicide attempt by you or a family member -an unusual or allergic reaction to gabapentin, other medicines, foods, dyes, or preservatives -pregnant or trying to get  pregnant -breast-feeding How should I use this medicine? Take this medicine by mouth with a glass of water. Follow the directions on the prescription label. You can take it with or without food. If it upsets your stomach, take it with food.Take your medicine at regular intervals. Do not take it more often than directed. Do not stop taking except on your doctor's advice. If you are directed to break the 600 or 800 mg tablets in half as part of your dose, the extra half tablet should be used for the next dose. If you have not used the extra half tablet within 28 days, it should be thrown away. A special MedGuide will be given to you by the pharmacist with each prescription and refill. Be sure to read this information carefully each time. Talk to your pediatrician regarding the use of this medicine in children. Special care may be needed. Overdosage: If you think you have taken too much of this medicine contact a poison control center or emergency room at once. NOTE: This medicine is only for you. Do not share this medicine with others. What if I miss a dose? If you miss a dose, take it as soon as you can. If it is almost time for your next dose, take only that dose. Do not take double or extra doses. What may interact with this medicine? Do not take this medicine with any of the following medications: -other gabapentin products This medicine may also interact with the following medications: -alcohol -antacids -antihistamines for allergy, cough and cold -certain medicines for anxiety or sleep -certain medicines for depression or psychotic disturbances -homatropine; hydrocodone -naproxen -narcotic medicines (opiates) for pain -phenothiazines like chlorpromazine, mesoridazine, prochlorperazine, thioridazine This list may not describe all possible interactions. Give your health care provider a list of all the medicines, herbs, non-prescription drugs, or dietary supplements you use. Also tell them  if you smoke, drink alcohol, or use illegal drugs. Some items may interact with your medicine. What should I watch for while using this medicine? Visit your doctor or health care professional for regular checks on your progress. You may want to keep a record at home of how you feel your condition is responding to treatment. You may want to share this information with your doctor or health care professional at each visit. You should contact your doctor or health care professional if your seizures get worse or if you have any new types of seizures. Do not stop taking this medicine or any of your seizure medicines unless instructed by your doctor or health care professional. Stopping your medicine suddenly can increase your seizures or their severity. Wear a medical identification bracelet or chain if you are taking this medicine for seizures, and carry a card that lists all your medications. You may get drowsy, dizzy, or have blurred vision. Do not drive, use machinery, or do anything that needs mental alertness until you know how this medicine affects you. To reduce dizzy or fainting spells, do not sit or stand up quickly, especially if you are an older patient. Alcohol can increase drowsiness and dizziness. Avoid alcoholic drinks. Your mouth may get dry.  Chewing sugarless gum or sucking hard candy, and drinking plenty of water will help. The use of this medicine may increase the chance of suicidal thoughts or actions. Pay special attention to how you are responding while on this medicine. Any worsening of mood, or thoughts of suicide or dying should be reported to your health care professional right away. Women who become pregnant while using this medicine may enroll in the [place] Pregnancy Registry by calling [phone]. This registry collects information about the safety of antiepileptic drug use during pregnancy. What side effects may I notice from receiving this  medicine? Side effects that you should report to your doctor or health care professional as soon as possible: -allergic reactions like skin rash, itching or hives, swelling of the face, lips, or tongue -worsening of mood, thoughts or actions of suicide or dying Side effects that usually do not require medical attention (report to your doctor or health care professional if they continue or are bothersome): -constipation -difficulty walking or controlling muscle movements -dizziness -nausea -slurred speech -tiredness -tremors -weight gain This list may not describe all possible side effects. Call your doctor for medical advice about side effects. You may report side effects to FDA at 1-800-FDA-1088. Where should I keep my medicine? Keep out of reach of children. Store at room temperature between 15 and 30 degrees C (59 and 86 degrees F). Throw away any unused medicine after the expiration date. NOTE: This sheet is a summary. It may not cover all possible information. If you have questions about this medicine, talk to your doctor, pharmacist, or health care provider.  2015, [employer]. (2013-05-05 09:12:48)

## 2015-04-24 ENCOUNTER — Other Ambulatory Visit (INDEPENDENT_AMBULATORY_CARE_PROVIDER_SITE_OTHER): Payer: Commercial Managed Care - HMO

## 2015-04-24 DIAGNOSIS — H17822 Peripheral opacity of cornea, left eye: Secondary | ICD-10-CM | POA: Diagnosis not present

## 2015-04-24 DIAGNOSIS — E119 Type 2 diabetes mellitus without complications: Secondary | ICD-10-CM | POA: Diagnosis not present

## 2015-04-24 DIAGNOSIS — B029 Zoster without complications: Secondary | ICD-10-CM | POA: Diagnosis not present

## 2015-04-24 LAB — HEPATIC FUNCTION PANEL
ALBUMIN: 4 g/dL (ref 3.5–5.2)
ALT: 41 U/L (ref 0–53)
AST: 22 U/L (ref 0–37)
Alkaline Phosphatase: 62 U/L (ref 39–117)
BILIRUBIN TOTAL: 0.6 mg/dL (ref 0.2–1.2)
Bilirubin, Direct: 0.1 mg/dL (ref 0.0–0.3)
Total Protein: 7.1 g/dL (ref 6.0–8.3)

## 2015-04-24 LAB — BASIC METABOLIC PANEL
BUN: 14 mg/dL (ref 6–23)
CO2: 25 meq/L (ref 19–32)
Calcium: 9.1 mg/dL (ref 8.4–10.5)
Chloride: 100 mEq/L (ref 96–112)
Creatinine, Ser: 1.1 mg/dL (ref 0.40–1.50)
GFR: 73.06 mL/min (ref >60.00)
Glucose, Bld: 306 mg/dL — ABNORMAL HIGH (ref 70–99)
Potassium: 3.8 mEq/L (ref 3.5–5.1)
Sodium: 133 mEq/L — ABNORMAL LOW (ref 135–145)

## 2015-04-24 LAB — LDL CHOLESTEROL, DIRECT: Direct LDL: 121 mg/dL

## 2015-04-24 LAB — LIPID PANEL
CHOL/HDL RATIO: 7
CHOLESTEROL: 180 mg/dL (ref 0–200)
HDL: 24.8 mg/dL — ABNORMAL LOW (ref >39.00)
NONHDL: 155.6
TRIGLYCERIDES: 258 mg/dL — AB (ref 0.0–149.0)
VLDL: 51.6 mg/dL — ABNORMAL HIGH (ref 0.0–40.0)

## 2015-04-24 LAB — HEMOGLOBIN A1C: Hgb A1c MFr Bld: 7.5 % — ABNORMAL HIGH (ref 4.6–6.5)

## 2015-04-26 ENCOUNTER — Encounter: Payer: Self-pay | Admitting: Internal Medicine

## 2015-04-26 ENCOUNTER — Ambulatory Visit (INDEPENDENT_AMBULATORY_CARE_PROVIDER_SITE_OTHER): Payer: Commercial Managed Care - HMO | Admitting: Internal Medicine

## 2015-04-26 VITALS — BP 124/82 | HR 71 | Temp 97.7°F | Ht 70.0 in | Wt 232.0 lb

## 2015-04-26 DIAGNOSIS — I10 Essential (primary) hypertension: Secondary | ICD-10-CM | POA: Diagnosis not present

## 2015-04-26 DIAGNOSIS — E119 Type 2 diabetes mellitus without complications: Secondary | ICD-10-CM | POA: Diagnosis not present

## 2015-04-26 DIAGNOSIS — Z0189 Encounter for other specified special examinations: Secondary | ICD-10-CM

## 2015-04-26 DIAGNOSIS — E785 Hyperlipidemia, unspecified: Secondary | ICD-10-CM | POA: Diagnosis not present

## 2015-04-26 DIAGNOSIS — Z0000 Encounter for general adult medical examination without abnormal findings: Secondary | ICD-10-CM

## 2015-04-26 NOTE — Progress Notes (Signed)
Pre visit review using our clinic review tool, if applicable. No additional management support is needed unless otherwise documented below in the visit note. 

## 2015-04-26 NOTE — Assessment & Plan Note (Signed)
stable overall by history and exam, recent data reviewed with pt, and pt to continue medical treatment as before,  to f/u any worsening symptoms or concerns Lab Results  Component Value Date   HGBA1C 7.5* 04/24/2015   delcines increased metformin due to risk of diarrhea

## 2015-04-26 NOTE — Assessment & Plan Note (Signed)
stable overall by history and exam, recent data reviewed with pt, and pt to continue medical treatment as before,  to f/u any worsening symptoms or concerns BP Readings from Last 3 Encounters:  04/26/15 124/82  04/21/15 140/100  04/19/15 130/88

## 2015-04-26 NOTE — Progress Notes (Signed)
Subjective:    Patient ID: Keith [name], male    DOB: 03/19/1956, 59 y.o.   MRN: [id number]  HPI   Here to f/u; overall doing ok,  Pt denies chest pain, increasing sob or doe, wheezing, orthopnea, PND, increased LE swelling, palpitations, dizziness or syncope.  Pt denies new neurological symptoms such as new headache, or facial or extremity weakness or numbness.  Pt denies polydipsia, polyuria, or low sugar episode.   Pt denies new neurological symptoms such as new headache, or facial or extremity weakness or numbness.   Pt states overall good compliance with meds, mostly trying to follow appropriate diet, with wt overall stable,  but little exercise however. BP Readings from Last 3 Encounters:  04/26/15 124/82  04/21/15 140/100  04/19/15 130/88   Wt Readings from Last 3 Encounters:  04/26/15 232 lb (105.235 kg)  04/21/15 234 lb (106.142 kg)  04/19/15 232 lb 2 oz (105.291 kg)   Past Medical History  Diagnosis Date  . Hyperlipidemia   . Diabetes mellitus   . GERD (gastroesophageal reflux disease)   . Hypertension   . Cervical spine pain     Chronic  . History of colonic polyps   . Low back pain     The pt is S/P spinal fusion surgeries  . Hereditary hemochromatosis     Compound heterozygote. Pt gets periodic phlebotomies  . Anxiety   . Chest pain     Exercise/adenosine Myoview showed EF 50% (3/10). Inferior hypokinesis. Perfusion images, inferior scar and ischemia in the apical anterior septum and in teh inferior wall . Suggestive of a CD. LHC (3/10) showed only lu minal irregulatrities in the coronaries w/ EF 55%, suggesting that myoview was a false positive, likely from diaphragmatic attnuation.  . Multifocal atrial tachycardia     Wandering atrial pacemaker. Holter monior in 5/11 showed frequent runs of symtomatic MAT with heart rate around 100. Pt is now  on amiodarone to try to suppress MAT. PFTs (6/11): FVC 105%, FEV1 109%, TLC 112%, DLCO 87% (minimal obstructive effect).  LFTs stable on amiodarone  . Cardiomyopathy     Echo (5/11) showed EF 30% with diffuse hypokinesis, normal wall thickness, mildly decreased RV systolic function. This may be a cardiomyopathy due to hemochromatosis versus tachy-mediated. Unable to get cardiac MRI due to claustrophobia.  . Peptic ulcer disease   . Fatty liver   . Arthritis   . Thyroid nodule   . Heart murmur    Past Surgical History  Procedure Laterality Date  . Inguinal hernia repair    . Back surgery  1991    Lumbar  . Tonsillectomy    . Spine surgery      reports that he quit smoking about 11 years ago. He has never used smokeless tobacco. He reports that he drinks about 0.5 oz of alcohol per week. He reports that he uses illicit drugs. family history includes Alcohol abuse in his father; Colitis in his mother; Colon polyps in his mother; Diabetes in his mother, paternal grandfather, and paternal grandmother; Hypertension in his father; Multiple sclerosis in his mother and sister. There is no history of Colon cancer. Allergies  Allergen Reactions  . Lipitor [Atorvastatin]     myalgias  . Metoclopramide Hcl   . Penicillins   . Sertraline Hcl    Current Outpatient Prescriptions on File Prior to Visit  Medication Sig Dispense Refill  . aspirin 81 MG EC tablet Take 81 mg by mouth daily.      Marland Kitchen  cetirizine (ZYRTEC) 10 MG tablet Take 1 tablet (10 mg total) by mouth as needed. 90 tablet 3  . Cholecalciferol (VITAMIN D-3 PO) Take by mouth.    . diazepam (VALIUM) 5 MG tablet Take 1 tablet (5 mg total) by mouth daily as needed for anxiety. 30 tablet 1  . fluticasone (FLONASE) 50 MCG/ACT nasal spray Place 2 sprays into the nose as needed. 16 g 5  . gabapentin (NEURONTIN) 100 MG capsule Take 1 capsule (100 mg total) by mouth 3 (three) times daily. 30 capsule 1  . glipiZIDE (GLUCOTROL XL) 5 MG 24 hr tablet TAKE 1 TABLET BY MOUTH DAILY WITH BREAKFAST. 90 tablet 1  . glucosamine-chondroitin 500-400 MG tablet Take 1 tablet by  mouth 3 (three) times daily.    Marland Kitchen ibuprofen (ADVIL,MOTRIN) 400 MG tablet Take 1 tablet (400 mg total) by mouth every 6 (six) hours as needed. 60 tablet 1  . levothyroxine (SYNTHROID, LEVOTHROID) 25 MCG tablet Take 1 tablet (25 mcg total) by mouth daily. 90 tablet 3  . losartan (COZAAR) 50 MG tablet TAKE 1 TABLET BY MOUTH DAILY. 90 tablet 1  . metoprolol succinate (TOPROL-XL) 100 MG 24 hr tablet Take 1 tablet (100 mg total) by mouth 2 (two) times daily. 180 tablet 3  . Omega-3 Fatty Acids (FISH OIL PO) Take by mouth.    . oxyCODONE-acetaminophen (PERCOCET) 7.5-325 MG per tablet Take 1 tablet by mouth every 8 (eight) hours as needed for severe pain. No extra tylenol, do not take norco with this. 30 tablet 0  . pantoprazole (PROTONIX) 40 MG tablet Take 1 tablet (40 mg total) by mouth daily. 90 tablet 3  . TURMERIC PO Take by mouth.    . valACYclovir (VALTREX) 1000 MG tablet Take 1 tablet (1,000 mg total) by mouth 3 (three) times daily. 21 tablet 0  . metFORMIN (GLUCOPHAGE-XR) 500 MG 24 hr tablet Take 3 tablets (1,500 mg total) by mouth daily with breakfast. 270 tablet 3   No current facility-administered medications on file prior to visit.   Review of Systems  Constitutional: Negative for unusual diaphoresis or night sweats HENT: Negative for ringing in ear or discharge Eyes: Negative for double vision or worsening visual disturbance.  Respiratory: Negative for choking and stridor.   Gastrointestinal: Negative for vomiting or other signifcant bowel change Genitourinary: Negative for hematuria or change in urine volume.  Musculoskeletal: Negative for other MSK pain or swelling Skin: Negative for color change and worsening wound.  Neurological: Negative for tremors and numbness other than noted  Psychiatric/Behavioral: Negative for decreased concentration or agitation other than above       Objective:   Physical Exam BP 124/82 mmHg  Pulse 71  Temp(Src) 97.7 F (36.5 C) (Oral)  Ht 5\' 10"   (1.778 m)  Wt 232 lb (105.235 kg)  BMI 33.29 kg/m2  SpO2 97% VS noted,  Constitutional: Pt appears in no significant distress HENT: Head: NCAT.  Right Ear: External ear normal.  Left Ear: External ear normal.  Eyes: . Pupils are equal, round, and reactive to light. Conjunctivae and EOM are normal Neck: Normal range of motion. Neck supple.  Cardiovascular: Normal rate and regular rhythm.   Pulmonary/Chest: Effort normal and breath sounds without rales or wheezing.  Abd:  Soft, NT, ND, + BS Neurological: Pt is alert. Not confused , motor grossly intact Skin: Skin is warm. No rash, no LE edema Psychiatric: Pt behavior is normal. No agitation.     Assessment & Plan:

## 2015-04-26 NOTE — Patient Instructions (Signed)
Please continue all other medications as before, and refills have been done if requested.  Please have the pharmacy call with any other refills you may need.  Please continue your efforts at being more active, low cholesterol diet, and weight control..  Please keep your appointments with your specialists as you may have planned  Please return in 6 months, or sooner if needed, with Lab testing done 3-5 days before  

## 2015-04-26 NOTE — Assessment & Plan Note (Signed)
stable overall by history and exam, recent data reviewed with pt, and pt to continue medical treatment as before,  to f/u any worsening symptoms or concerns Lab Results  Component Value Date   Kidron 79 11/30/2008   For fu lab next visit, has been lipitor intol, declines further statin trial due to hemochromatosis

## 2015-06-07 ENCOUNTER — Other Ambulatory Visit: Payer: Self-pay | Admitting: Internal Medicine

## 2015-08-03 ENCOUNTER — Other Ambulatory Visit: Payer: Self-pay | Admitting: Internal Medicine

## 2015-09-04 NOTE — Progress Notes (Signed)
Cardiology Office Note    Date:  09/05/2015   ID:  Keith [name], DOB 1955/12/12, MRN [id number]  PCP:  Keith Cower, MD  Cardiologist:  Dr. Loralie [name]   Electrophysiologist:  n/a  Chief Complaint  Patient presents with  . Follow-up  . Cardiomyopathy  . Atrial tachycardia    History of Present Illness:  Keith [name] is a 59 y.o. male with a hx of DM2, HL, hereditary hemachromatosis and atrial tachycardia. He also has a history of NICM with EF as low as 30% in the past. LHC 3/10 demonstrated mild luminal irregularities. Cardiomyopathy was felt to be related to hemochromatosis. Atrial tachycardia has been symptomatic and suppressed with amiodarone and Toprol-XL. Echo in 5/12 demonstrated improved LV function with an even 55-60%. ETT 10/12 demonstrated no ischemia. Last seen by Dr. Aundra [name] 12/15. He was noted to have multifocal atrial tachycardia and wandering atrial pacemaker on Holter and ECG. Hemochromatosis with infiltration of the atria may be the cause of his arrhythmia. No atrial fibrillation had been documented. Previously, amiodarone had been discontinued. The patient remained stable without tachypalpitations. He was left on Toprol-XL only. Repeat echo in 12/15 demonstrated EF 50%.  Returns for follow-up.  He is doing well. Denies chest pain, shortness of breath, syncope, orthopnea, PND or edema. Denies any rapid palpitations.  Past Medical History  Diagnosis Date  . Hyperlipidemia   . Diabetes mellitus   . GERD (gastroesophageal reflux disease)   . Hypertension   . Cervical spine pain     Chronic  . History of colonic polyps   . Low back pain     The pt is S/P spinal fusion surgeries  . Hereditary hemochromatosis     Compound heterozygote. Pt gets periodic phlebotomies  . Anxiety   . Chest pain     Exercise/adenosine Myoview showed EF 50% (3/10). Inferior hypokinesis. Perfusion images, inferior scar and ischemia in the apical anterior septum and in teh inferior  wall . Suggestive of a CD. LHC (3/10) showed only lu minal irregulatrities in the coronaries w/ EF 55%, suggesting that myoview was a false positive, likely from diaphragmatic attnuation.  . Multifocal atrial tachycardia (HCC)     Wandering atrial pacemaker. Holter monior in 5/11 showed frequent runs of symtomatic MAT with heart rate around 100. Pt is now  on amiodarone to try to suppress MAT. PFTs (6/11): FVC 105%, FEV1 109%, TLC 112%, DLCO 87% (minimal obstructive effect). LFTs stable on amiodarone  . Cardiomyopathy     Echo (5/11) showed EF 30% with diffuse hypokinesis, normal wall thickness, mildly decreased RV systolic function. This may be a cardiomyopathy due to hemochromatosis versus tachy-mediated. Unable to get cardiac MRI due to claustrophobia.  . Peptic ulcer disease   . Fatty liver   . Arthritis   . Thyroid nodule   . Heart murmur   1. Hyperlipidemia: myalgias with atorvastatin and pravastatin. 2. Type 2 diabetes. 3. Gastroesophageal reflux disease. 4. Hypertension. 5. Chronic C-spine pain. 6. Colonic polyps. 7. Low back pain. The patient is status post spinal fusion surgeries. 8. Hereditary hemochromatosis: Compound heterozygote. Patient gets periodic phlebotomies 10. Anxiety. 11. Chest pain: Exercise/adenosine Myoview showed EF of 50% (3/10). There was inferior hypokinesis. On perfusion images, there was inferior scar and ischemia in the apical anterior septum and in the inferior wall. This is suggestive of a coronary disease. LHC (3/10) showed only luminal irregularities in the coronaries with EF 55%, suggesting that myoview was a false positive, likely from diaphragmatic  attenuation.  12. Multifocal atrial tachycardia/wandering atrial pacemaker. Holter monitor in 5/11 showed frequent runs of symptomatic MAT with heart rate around 100. Patient is now on amiodarone to try to suppress MAT. PFTs (6/11): FVC 105%, FEV1 109%, TLC 112%, DLCO 87% (minimal obstructive  defect). LFTs stable on amiodarone. Holter (11/12) with PACs, blocked PACs, short runs of atrial tachycardia 13. Cardiomyopathy: echo (5/11) showed EF 30% with diffuse hypokinesis, normal wall thickness, mildly decreased RV systolic function. This may be a cardiomyopathy due to hemochromatosis versus tachy-mediated. Unable to get cardiac MRI due to claustrophobia. Echo (5/12): EF 55-60%, mild LAE.  14. Hypothyroidism  Past Surgical History  Procedure Laterality Date  . Inguinal hernia repair    . Back surgery  1991    Lumbar  . Tonsillectomy    . Spine surgery      Current Outpatient Prescriptions  Medication Sig Dispense Refill  . aspirin 81 MG EC tablet Take 81 mg by mouth daily.      . cetirizine (ZYRTEC) 10 MG tablet Take 10 mg by mouth daily as needed for allergies.    . Cholecalciferol (VITAMIN D-3 PO) Take 1 tablet by mouth daily. PATIENT TAKES ONCE TABLET BY MOUTH ONCE DAILY . PATIENT NOT SURE OF DOSEAGE.    Marland Kitchen diazepam (VALIUM) 5 MG tablet Take 1 tablet (5 mg total) by mouth daily as needed for anxiety. 30 tablet 1  . fluticasone (FLONASE) 50 MCG/ACT nasal spray Place 1 spray into both nostrils daily as needed for allergies or rhinitis.    Marland Kitchen gabapentin (NEURONTIN) 100 MG capsule Take 1 capsule (100 mg total) by mouth 3 (three) times daily. 30 capsule 1  . glipiZIDE (GLUCOTROL XL) 5 MG 24 hr tablet Take 5 mg by mouth daily with breakfast.    . glucosamine-chondroitin 500-400 MG tablet Take 1 tablet by mouth 3 (three) times daily.    Marland Kitchen ibuprofen (ADVIL,MOTRIN) 400 MG tablet Take 400 mg by mouth every 6 (six) hours as needed for headache or moderate pain.    Marland Kitchen levothyroxine (SYNTHROID, LEVOTHROID) 25 MCG tablet Take 25 mcg by mouth daily before breakfast.    . losartan (COZAAR) 50 MG tablet Take 50 mg by mouth daily.    . metFORMIN (GLUCOPHAGE-XR) 500 MG 24 hr tablet TAKE 3 TABLETS (1,500 MG TOTAL) BY MOUTH DAILY WITH BREAKFAST 270 tablet 3  . metoprolol succinate (TOPROL-XL)  100 MG 24 hr tablet Take 100 mg by mouth 2 (two) times daily. Take with or immediately following a meal.    . Omega-3 Fatty Acids (FISH OIL PO) Take 1 tablet by mouth daily.     . pantoprazole (PROTONIX) 40 MG tablet Take 40 mg by mouth daily.    . TURMERIC PO Take 1 tablet by mouth daily. Patient takes one tablet once daily. Patient not sure of doseage.     No current facility-administered medications for this visit.    Allergies:   Lipitor; Metoclopramide hcl; Penicillins; and Sertraline hcl   Social History   Social History  . Marital Status: Married    Spouse Name: [name]  . Number of Children: 3  . Years of Education: [name]   Occupational History  . Disabled    Social History Main Topics  . Smoking status: Former Smoker    Quit date: 09/16/2003  . Smokeless tobacco: Never Used  . Alcohol Use: 0.5 oz/week    1 Standard drinks or equivalent per week     Comment: 3-4 drinks per month  .  Drug Use: Yes     Comment: Quit in 1980  . Sexual Activity: Not Asked   Other Topics Concern  . None   Social History Narrative   Married   Lives in [place]   3 children   Daily caffeine use - 3 drinks daily   Does not get regular exercise      [employer] on file.     Family History:  The patient's family history includes Alcohol abuse in his father; Colitis in his mother; Colon polyps in his mother; Diabetes in his mother, paternal grandfather, and paternal grandmother; Hypertension in his father; Multiple sclerosis in his mother and sister. There is no history of Colon cancer.   ROS:   Please see the history of present illness.    Review of Systems  Musculoskeletal: Positive for back pain and muscle weakness.  Neurological: Positive for loss of balance.  All other systems reviewed and are negative.   PHYSICAL EXAM:   VS:  BP 124/82 mmHg  Pulse 66  Ht 5\' 10"  (1.778 m)  Wt 239 lb (108.41 kg)  BMI 34.29 kg/m2   GEN: Well nourished, well developed, in no acute  distress HEENT: normal Neck: no JVD, no carotid bruits, no masses Cardiac: Normal S1/S2,  RRR; no murmurs, rubs, or gallops, no edema  Respiratory:  clear to auscultation bilaterally; no wheezing, rhonchi or rales GI: soft, nontender, nondistended, + BS MS: no deformity or atrophy Skin: warm and dry, no rash Neuro:  Bilateral strength equal, no focal deficits  Psych: Alert and oriented x 3, normal affect   Wt Readings from Last 3 Encounters:  09/05/15 239 lb (108.41 kg)  04/26/15 232 lb (105.235 kg)  04/21/15 234 lb (106.142 kg)      Studies/Labs Reviewed:   EKG:  EKG is ordered today.  The ekg ordered today demonstrates NSR, HR 66, normal axis, QTc 431 ms, no changes since last tracing   Recent Labs: 10/20/2014: Hemoglobin 15.7; Platelets 210.0; TSH 3.94 04/24/2015: ALT 41; BUN 14; Creatinine, Ser 1.10; Potassium 3.8; Sodium 133*   Recent Lipid Panel    Component Value Date/Time   CHOL 180 04/24/2015 1319   TRIG 258.0* 04/24/2015 1319   HDL 24.80* 04/24/2015 1319   CHOLHDL 7 04/24/2015 1319   VLDL 51.6* 04/24/2015 1319   LDLCALC 79 11/30/2008 0934   LDLDIRECT 121.0 04/24/2015 1319    Additional studies/ records that were reviewed today include:   Echo 09/05/14 Mild LVH, EF 50%, normal wall motion, grade 1 diastolic dysfunction  Holter 10/12 PACs, blocked PACs, short runs of A. tach  ETT 10/12 No ischemic ST changes  LHC 11/2008 EF 55% Luminal irregularities in LAD, otherwise no significant CAD   ASSESSMENT:    1. NICM (nonischemic cardiomyopathy) ([place])   2. Atrial tachycardia ([place])   3. Hyperlipidemia   4. Essential hypertension     PLAN:  In order of problems listed above:  1. Atrial tachycardia - No rapid palpitations.  He has remained stable off of Amiodarone.  Continue current dose of beta-blocker.   2. Nonischemic cardiomyopathy - EF remained normal by echo in 2015.  Continue angiotensin receptor blocker, beta-blocker. No evidence of CHF.    3.  Hyperlipidemia - He has not tolerated statins in the past.  He has had recent arthralgias in his arms and has been evaluated extensively without significant findings.  He has not taken statins for a while.  HL managed by PCP.   4. HTN -  Controlled.  Continue current Rx. SCr in 8/16 1.1.      Medication Adjustments/Labs and Tests Ordered: Current medicines are reviewed at length with the patient today.  Concerns regarding medicines are outlined above.  Medication changes, Labs and Tests ordered today are listed below. Patient Instructions  Medication Instructions:  No changes. Continue your same medications.   Labwork: None today.   Testing/Procedures: None   Follow-Up: Dr. Loralie [name] in 1 year.  The office will contact you to schedule this appointment.   Any Other Special Instructions Will Be Listed Below (If Applicable).       Signed, Richardson Dopp, PA-C  09/05/2015 9:12 AM    [facility] [address], [place], [place]  [address] Phone: [phone]; Fax: [phone]

## 2015-09-05 ENCOUNTER — Encounter: Payer: Self-pay | Admitting: Physician Assistant

## 2015-09-05 ENCOUNTER — Ambulatory Visit (INDEPENDENT_AMBULATORY_CARE_PROVIDER_SITE_OTHER): Payer: Commercial Managed Care - HMO | Admitting: Physician Assistant

## 2015-09-05 VITALS — BP 124/82 | HR 66 | Ht 70.0 in | Wt 239.0 lb

## 2015-09-05 DIAGNOSIS — E785 Hyperlipidemia, unspecified: Secondary | ICD-10-CM | POA: Diagnosis not present

## 2015-09-05 DIAGNOSIS — I471 Supraventricular tachycardia: Secondary | ICD-10-CM

## 2015-09-05 DIAGNOSIS — I10 Essential (primary) hypertension: Secondary | ICD-10-CM | POA: Diagnosis not present

## 2015-09-05 DIAGNOSIS — I429 Cardiomyopathy, unspecified: Secondary | ICD-10-CM | POA: Diagnosis not present

## 2015-09-05 DIAGNOSIS — I428 Other cardiomyopathies: Secondary | ICD-10-CM

## 2015-09-05 NOTE — Patient Instructions (Signed)
Medication Instructions:  No changes. Continue your same medications.   Labwork: None today.   Testing/Procedures: None   Follow-Up: Dr. Loralie Champagne in 1 year.  The office will contact you to schedule this appointment.   Any Other Special Instructions Will Be Listed Below (If Applicable).

## 2015-09-06 ENCOUNTER — Encounter: Payer: Self-pay | Admitting: Physician Assistant

## 2015-09-11 NOTE — Addendum Note (Signed)
Addended by: Freada Bergeron on: 09/11/2015 05:21 PM   Modules accepted: Orders

## 2015-10-29 ENCOUNTER — Other Ambulatory Visit (INDEPENDENT_AMBULATORY_CARE_PROVIDER_SITE_OTHER): Payer: Commercial Managed Care - HMO

## 2015-10-29 DIAGNOSIS — R7989 Other specified abnormal findings of blood chemistry: Secondary | ICD-10-CM | POA: Diagnosis not present

## 2015-10-29 DIAGNOSIS — Z0189 Encounter for other specified special examinations: Secondary | ICD-10-CM | POA: Diagnosis not present

## 2015-10-29 DIAGNOSIS — E119 Type 2 diabetes mellitus without complications: Secondary | ICD-10-CM

## 2015-10-29 DIAGNOSIS — Z0000 Encounter for general adult medical examination without abnormal findings: Secondary | ICD-10-CM

## 2015-10-29 LAB — URINALYSIS, ROUTINE W REFLEX MICROSCOPIC
Bilirubin Urine: NEGATIVE
Hgb urine dipstick: NEGATIVE
KETONES UR: NEGATIVE
Leukocytes, UA: NEGATIVE
NITRITE: NEGATIVE
RBC / HPF: NONE SEEN (ref 0–?)
SPECIFIC GRAVITY, URINE: 1.01 (ref 1.000–1.030)
TOTAL PROTEIN, URINE-UPE24: NEGATIVE
UROBILINOGEN UA: 0.2 (ref 0.0–1.0)
pH: 6 (ref 5.0–8.0)

## 2015-10-29 LAB — CBC WITH DIFFERENTIAL/PLATELET
Basophils Absolute: 0.1 10*3/uL (ref 0.0–0.1)
Basophils Relative: 0.8 % (ref 0.0–3.0)
EOS PCT: 3.4 % (ref 0.0–5.0)
Eosinophils Absolute: 0.3 10*3/uL (ref 0.0–0.7)
HCT: 47.6 % (ref 39.0–52.0)
HEMOGLOBIN: 16 g/dL (ref 13.0–17.0)
LYMPHS PCT: 25.8 % (ref 12.0–46.0)
Lymphs Abs: 2.6 10*3/uL (ref 0.7–4.0)
MCHC: 33.5 g/dL (ref 30.0–36.0)
MCV: 89.1 fl (ref 78.0–100.0)
MONOS PCT: 10.6 % (ref 3.0–12.0)
Monocytes Absolute: 1.1 10*3/uL — ABNORMAL HIGH (ref 0.1–1.0)
Neutro Abs: 6.1 10*3/uL (ref 1.4–7.7)
Neutrophils Relative %: 59.4 % (ref 43.0–77.0)
Platelets: 205 10*3/uL (ref 150.0–400.0)
RBC: 5.34 Mil/uL (ref 4.22–5.81)
RDW: 13.8 % (ref 11.5–15.5)
WBC: 10.2 10*3/uL (ref 4.0–10.5)

## 2015-10-29 LAB — LIPID PANEL
CHOLESTEROL: 181 mg/dL (ref 0–200)
HDL: 24 mg/dL — AB (ref >39.00)
NonHDL: 156.92
Total CHOL/HDL Ratio: 8
Triglycerides: 220 mg/dL — ABNORMAL HIGH (ref 0.0–149.0)
VLDL: 44 mg/dL — AB (ref 0.0–40.0)

## 2015-10-29 LAB — BASIC METABOLIC PANEL
BUN: 13 mg/dL (ref 6–23)
CALCIUM: 9.5 mg/dL (ref 8.4–10.5)
CO2: 28 mEq/L (ref 19–32)
CREATININE: 1.13 mg/dL (ref 0.40–1.50)
Chloride: 101 mEq/L (ref 96–112)
GFR: 70.7 mL/min (ref >60.00)
GLUCOSE: 176 mg/dL — AB (ref 70–99)
Potassium: 4.2 mEq/L (ref 3.5–5.1)
Sodium: 137 mEq/L (ref 135–145)

## 2015-10-29 LAB — MICROALBUMIN / CREATININE URINE RATIO
Creatinine,U: 124.8 mg/dL
Microalb Creat Ratio: 0.6 mg/g (ref 0.0–30.0)
Microalb, Ur: <0.7 mg/dL (ref 0.0–1.9)

## 2015-10-29 LAB — TSH: TSH: 2.82 u[IU]/mL (ref 0.35–4.50)

## 2015-10-29 LAB — HEPATIC FUNCTION PANEL
ALBUMIN: 4.1 g/dL (ref 3.5–5.2)
ALT: 46 U/L (ref 0–53)
AST: 41 U/L — ABNORMAL HIGH (ref 0–37)
Alkaline Phosphatase: 75 U/L (ref 39–117)
Bilirubin, Direct: 0.1 mg/dL (ref 0.0–0.3)
Total Bilirubin: 0.8 mg/dL (ref 0.2–1.2)
Total Protein: 7.3 g/dL (ref 6.0–8.3)

## 2015-10-29 LAB — LDL CHOLESTEROL, DIRECT: Direct LDL: 121 mg/dL

## 2015-10-29 LAB — PSA: PSA: 0.89 ng/mL (ref 0.10–4.00)

## 2015-10-29 LAB — HEMOGLOBIN A1C: HEMOGLOBIN A1C: 8.6 % — AB (ref 4.6–6.5)

## 2015-10-30 ENCOUNTER — Ambulatory Visit (INDEPENDENT_AMBULATORY_CARE_PROVIDER_SITE_OTHER): Payer: Commercial Managed Care - HMO | Admitting: Internal Medicine

## 2015-10-30 ENCOUNTER — Encounter: Payer: Self-pay | Admitting: Internal Medicine

## 2015-10-30 VITALS — BP 122/82 | HR 73 | Temp 98.1°F | Ht 70.0 in | Wt 240.0 lb

## 2015-10-30 DIAGNOSIS — E119 Type 2 diabetes mellitus without complications: Secondary | ICD-10-CM | POA: Diagnosis not present

## 2015-10-30 DIAGNOSIS — L989 Disorder of the skin and subcutaneous tissue, unspecified: Secondary | ICD-10-CM | POA: Insufficient documentation

## 2015-10-30 DIAGNOSIS — Z0000 Encounter for general adult medical examination without abnormal findings: Secondary | ICD-10-CM | POA: Diagnosis not present

## 2015-10-30 DIAGNOSIS — I10 Essential (primary) hypertension: Secondary | ICD-10-CM

## 2015-10-30 DIAGNOSIS — E785 Hyperlipidemia, unspecified: Secondary | ICD-10-CM | POA: Diagnosis not present

## 2015-10-30 MED ORDER — TRIAMCINOLONE ACETONIDE 55 MCG/ACT NA AERO: 2.0000 | INHALATION_SPRAY | Freq: Every day | NASAL | Status: DC

## 2015-10-30 MED ORDER — GLIPIZIDE ER 10 MG PO TB24: 10.0000 mg | ORAL_TABLET | Freq: Every day | ORAL | Status: DC

## 2015-10-30 MED ORDER — ROSUVASTATIN CALCIUM 10 MG PO TABS: 10.0000 mg | ORAL_TABLET | Freq: Every day | ORAL | Status: DC

## 2015-10-30 NOTE — Assessment & Plan Note (Signed)
Mild uncontrolled, for increased glipizde ER to 10 mg daily Lab Results  Component Value Date   HGBA1C 8.6* 10/29/2015   Cont diet, wt loss efforts

## 2015-10-30 NOTE — Patient Instructions (Addendum)
Please take all new medication as prescribed - the crestor 10 mg for cholesterol, and the glipizide ER 10 mg per day  OK to stop the glipizide ER 5 mg per day (the lower dose glipizide)  OK to stop the flonase; Please take all new medication as prescribed - the Nasacort AQ  Please continue all other medications as before, and refills have been done if requested.  Please have the pharmacy call with any other refills you may need.  Please continue your efforts at being more active, low cholesterol diet, and weight control.  You are otherwise up to date with prevention measures today.  You will be contacted regarding the referral for: Dermatology, as well as Dr Pyrtle/GI  Please keep your appointments with your specialists as you may have planned  Please return in 6 months, or sooner if needed, with Lab testing done 3-5 days before

## 2015-10-30 NOTE — Assessment & Plan Note (Signed)

## 2015-10-30 NOTE — Assessment & Plan Note (Signed)
Due for fu GI, for ferritin/iron as well

## 2015-10-30 NOTE — Progress Notes (Signed)
Subjective:    Patient ID: Keith [name], male    DOB: 23-Dec-1955, 60 y.o.   MRN: [id number]  HPI  Here for wellness and f/u;  Overall doing ok;  Pt denies Chest pain, worsening SOB, DOE, wheezing, orthopnea, PND, worsening LE edema, palpitations, dizziness or syncope.  Pt denies neurological change such as new headache, facial or extremity weakness.  Pt denies polydipsia, polyuria, or low sugar symptoms. Pt states overall good compliance with treatment and medications, good tolerability, and has been trying to follow appropriate diet.  Pt denies worsening depressive symptoms, suicidal ideation or panic. No fever, night sweats, wt loss, loss of appetite, or other constitutional symptoms.  Pt states good ability with ADL's, has low fall risk, home safety reviewed and adequate, no other significant changes in hearing or vision, and only occasionally active with exercise.  Has not been able to take lipitor, but willing to try crestor.  Admits to some diet noncompliance and sugars higher recently.  Realizes today on chart review that he has not f/u with Gi/Dr Pyrtle as planned, last visit mar 2015 with normal ferritin.  Has irritating recurrent skin lesion to right lateral hip area/belt line, asks for derm referral.  Does have several wks ongoing nasal allergy symptoms with clearish congestion, itch and sneezing, without fever, pain, ST, cough, swelling or wheezing, but also with 3 episodes short lived small epistaxis over last 3 days Past Medical History  Diagnosis Date  . Hyperlipidemia   . Diabetes mellitus   . GERD (gastroesophageal reflux disease)   . Hypertension   . Cervical spine pain     Chronic  . History of colonic polyps   . Low back pain     The pt is S/P spinal fusion surgeries  . Hereditary hemochromatosis     Compound heterozygote. Pt gets periodic phlebotomies  . Anxiety   . Chest pain     Exercise/adenosine Myoview showed EF 50% (3/10). Inferior hypokinesis. Perfusion images,  inferior scar and ischemia in the apical anterior septum and in teh inferior wall . Suggestive of a CD. LHC (3/10) showed only lu minal irregulatrities in the coronaries w/ EF 55%, suggesting that myoview was a false positive, likely from diaphragmatic attnuation.  . Multifocal atrial tachycardia (HCC)     Wandering atrial pacemaker. Holter monior in 5/11 showed frequent runs of symtomatic MAT with heart rate around 100. Pt is now  on amiodarone to try to suppress MAT. PFTs (6/11): FVC 105%, FEV1 109%, TLC 112%, DLCO 87% (minimal obstructive effect). LFTs stable on amiodarone  . Cardiomyopathy     Echo (5/11) showed EF 30% with diffuse hypokinesis, normal wall thickness, mildly decreased RV systolic function. This may be a cardiomyopathy due to hemochromatosis versus tachy-mediated. Unable to get cardiac MRI due to claustrophobia.  . Peptic ulcer disease   . Fatty liver   . Arthritis   . Thyroid nodule   . Heart murmur    Past Surgical History  Procedure Laterality Date  . Inguinal hernia repair    . Back surgery  1991    Lumbar  . Tonsillectomy    . Spine surgery      reports that he quit smoking about 12 years ago. He has never used smokeless tobacco. He reports that he drinks about 0.5 oz of alcohol per week. He reports that he uses illicit drugs. family history includes Alcohol abuse in his father; Colitis in his mother; Colon polyps in his mother; Diabetes in his mother,  paternal grandfather, and paternal grandmother; Hypertension in his father; Multiple sclerosis in his mother and sister. There is no history of Colon cancer. Allergies  Allergen Reactions  . Lipitor [Atorvastatin]     myalgias  . Metoclopramide Hcl   . Penicillins   . Sertraline Hcl    Current Outpatient Prescriptions on File Prior to Visit  Medication Sig Dispense Refill  . aspirin 81 MG EC tablet Take 81 mg by mouth daily.      . cetirizine (ZYRTEC) 10 MG tablet Take 10 mg by mouth daily as needed for  allergies.    . Cholecalciferol (VITAMIN D-3 PO) Take 1 tablet by mouth daily. PATIENT TAKES ONCE TABLET BY MOUTH ONCE DAILY . PATIENT NOT SURE OF DOSEAGE.    . fluticasone (FLONASE) 50 MCG/ACT nasal spray Place 1 spray into both nostrils daily as needed for allergies or rhinitis.    Marland Kitchen glucosamine-chondroitin 500-400 MG tablet Take 1 tablet by mouth 3 (three) times daily.    Marland Kitchen levothyroxine (SYNTHROID, LEVOTHROID) 25 MCG tablet Take 25 mcg by mouth daily before breakfast.    . losartan (COZAAR) 50 MG tablet Take 50 mg by mouth daily.    . metFORMIN (GLUCOPHAGE-XR) 500 MG 24 hr tablet TAKE 3 TABLETS (1,500 MG TOTAL) BY MOUTH DAILY WITH BREAKFAST 270 tablet 3  . metoprolol succinate (TOPROL-XL) 100 MG 24 hr tablet Take 100 mg by mouth 2 (two) times daily. Take with or immediately following a meal.    . Omega-3 Fatty Acids (FISH OIL PO) Take 1 tablet by mouth daily.     . pantoprazole (PROTONIX) 40 MG tablet Take 40 mg by mouth daily.    . TURMERIC PO Take 1 tablet by mouth daily. Patient takes one tablet once daily. Patient not sure of doseage.    . diazepam (VALIUM) 5 MG tablet Take 1 tablet (5 mg total) by mouth daily as needed for anxiety. (Patient not taking: Reported on 10/30/2015) 30 tablet 1  . ibuprofen (ADVIL,MOTRIN) 400 MG tablet Take 400 mg by mouth every 6 (six) hours as needed for headache or moderate pain. Reported on 10/30/2015     No current facility-administered medications on file prior to visit.   Review of Systems /Constitutional: Negative for increased diaphoresis, other activity, appetite or siginficant weight change other than noted HENT: Negative for worsening hearing loss, ear pain, facial swelling, mouth sores and neck stiffness.   Eyes: Negative for other worsening pain, redness or visual disturbance.  Respiratory: Negative for shortness of breath and wheezing  Cardiovascular: Negative for chest pain and palpitations.  Gastrointestinal: Negative for diarrhea, blood in  stool, abdominal distention or other pain Genitourinary: Negative for hematuria, flank pain or change in urine volume.  Musculoskeletal: Negative for myalgias or other joint complaints.  Skin: Negative for color change and wound or drainage.  Neurological: Negative for syncope and numbness. other than noted Hematological: Negative for adenopathy. or other swelling Psychiatric/Behavioral: Negative for hallucinations, SI, self-injury, decreased concentration or other worsening agitation.      Objective:   Physical Exam BP 122/82 mmHg  Pulse 73  Temp(Src) 98.1 F (36.7 C) (Oral)  Ht 5\' 10"  (1.778 m)  Wt 240 lb (108.863 kg)  BMI 34.44 kg/m2  SpO2 73% VS noted,  Constitutional: Pt is oriented to person, place, and time. Appears well-developed and well-nourished, in no significant distress Head: Normocephalic and atraumatic.  Right Ear: External ear normal.  Left Ear: External ear normal.  Nose: Nose normal.  Mouth/Throat: Oropharynx is  clear and moist.  Bilat tm's with mild erythema.  Max sinus areas non tender.  Pharynx with mild erythema, no exudate Eyes: Conjunctivae and EOM are normal. Pupils are equal, round, and reactive to light.  Neck: Normal range of motion. Neck supple. No JVD present. No tracheal deviation present or significant neck LA or mass Cardiovascular: Normal rate, regular rhythm, normal heart sounds and intact distal pulses.   Pulmonary/Chest: Effort normal and breath sounds without rales or wheezing  Abdominal: Soft. Bowel sounds are normal. NT. No HSM  Musculoskeletal: Normal range of motion. Exhibits no edema.  Lymphadenopathy:  Has no cervical adenopathy.  Neurological: Pt is alert and oriented to person, place, and time. Pt has normal reflexes. No cranial nerve deficit. Motor grossly intact Skin: Skin is warm and dry. No rash noted. right postlateral hip area at the beltline with slight raised regular tan nontender skin lesion approx 10 mm  Psychiatric:  Has  normal mood and affect. Behavior is normal.          Noted;     [facility]            [address], [place] [address]              [phone]  ------------------------------------------------------------------- Transthoracic Echocardiography  Patient:  Ireoluwa, Soisson MR #:    [id number] Study Date: 09/05/2014 Gender:   M Age:    26 Height:   177.8 cm Weight:   103.9 kg BSA:    2.3 m^2 Pt. Status: Room:  ATTENDING  Default, Provider [phone] SONOGRAPHER Bufalo, Will Elenore Paddy, M.D. REFERRING  Loralie Champagne, M.D. PERFORMING  [facility], Outpatient  cc:  ------------------------------------------------------------------- LV EF: 50%  ------------------------------------------------------------------- Indications:   (I42.9).  ------------------------------------------------------------------- History:  PMH: Cardiomyopathy. Acquired from the patient and from the patient&'s chart. Risk factors: Former tobacco use. Hypertension. Diabetes mellitus. Dyslipidemia.  ------------------------------------------------------------------- Study Conclusions  - Left ventricle: The cavity size was normal. Wall thickness was increased in a pattern of mild LVH. Systolic function was normal. The estimated ejection fraction was 50%. Wall motion was normal; there were no regional wall motion abnormalities. Doppler parameters are consistent with abnormal left ventricular relaxation (grade 1 diastolic dysfunction).  Impressions:  - Low normal to mild global reduction in LV function; EF 50; grade 1 diastolic dysfunction.   Lab Results  Component Value Date   WBC 10.2 10/29/2015   HGB 16.0 10/29/2015   HCT 47.6 10/29/2015   PLT 205.0 10/29/2015   GLUCOSE 176* 10/29/2015   CHOL 181 10/29/2015   TRIG 220.0* 10/29/2015   HDL 24.00* 10/29/2015    LDLDIRECT 121.0 10/29/2015   LDLCALC 79 11/30/2008   ALT 46 10/29/2015   AST 41* 10/29/2015   NA 137 10/29/2015   K 4.2 10/29/2015   CL 101 10/29/2015   CREATININE 1.13 10/29/2015   BUN 13 10/29/2015   CO2 28 10/29/2015   TSH 2.82 10/29/2015   PSA 0.89 10/29/2015   INR 1.1* 12/07/2013   HGBA1C 8.6* 10/29/2015   MICROALBUR <0.7 10/29/2015       Assessment & Plan:

## 2015-10-30 NOTE — Assessment & Plan Note (Signed)
Uncontrolled, pt willing to try crestor 10 mg daily, as joint discomfort he though prior to lipitor never really improved

## 2015-10-30 NOTE — Progress Notes (Signed)
Pre visit review using our clinic review tool, if applicable. No additional management support is needed unless otherwise documented below in the visit note. 

## 2015-10-30 NOTE — Assessment & Plan Note (Signed)
Recently increased size and iriritation per pt, for derm referral, seems o/w benign in appearance

## 2015-10-30 NOTE — Assessment & Plan Note (Signed)
stable overall by history and exam, recent data reviewed with pt, and pt to continue medical treatment as before,  to f/u any worsening symptoms or concerns BP Readings from Last 3 Encounters:  10/30/15 122/82  09/05/15 124/82  04/26/15 124/82

## 2015-11-14 DIAGNOSIS — L820 Inflamed seborrheic keratosis: Secondary | ICD-10-CM | POA: Diagnosis not present

## 2015-11-14 DIAGNOSIS — D225 Melanocytic nevi of trunk: Secondary | ICD-10-CM | POA: Diagnosis not present

## 2015-11-14 DIAGNOSIS — L821 Other seborrheic keratosis: Secondary | ICD-10-CM | POA: Diagnosis not present

## 2015-12-11 ENCOUNTER — Encounter: Payer: Self-pay | Admitting: *Deleted

## 2015-12-19 ENCOUNTER — Ambulatory Visit: Payer: Commercial Managed Care - HMO | Admitting: Internal Medicine

## 2015-12-19 ENCOUNTER — Other Ambulatory Visit: Payer: Self-pay | Admitting: Internal Medicine

## 2016-02-29 ENCOUNTER — Telehealth: Payer: Self-pay | Admitting: Family Medicine

## 2016-02-29 NOTE — Telephone Encounter (Signed)
6.16.17 Pt came in complaining of severe back pain, he states he had back surgery before and thinks a metal rod is messed up. Pt refuses to see Dr. Jenny Reichmann, or go to ER/urgent care. Can you see if pt can get in soon, Greg's next available isn't until June 30th and [place] week of July 10th. Please call pt if an appointment can/can't be made any sooner. MS

## 2016-02-29 NOTE — Telephone Encounter (Signed)
We will see what we can do Have not seen patient over a year. Starting next week we are covering a soccer tournament and will not be in clinic the entire time.  If has has had surgery he should consider seeing his surgeon and if worsening he needs to go to emergency room immediately.

## 2016-03-02 ENCOUNTER — Other Ambulatory Visit: Payer: Self-pay | Admitting: Internal Medicine
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
## 2016-03-14 ENCOUNTER — Ambulatory Visit (INDEPENDENT_AMBULATORY_CARE_PROVIDER_SITE_OTHER): Payer: Commercial Managed Care - HMO | Admitting: Family Medicine

## 2016-03-14 ENCOUNTER — Ambulatory Visit (INDEPENDENT_AMBULATORY_CARE_PROVIDER_SITE_OTHER)
Admission: RE | Admit: 2016-03-14 | Discharge: 2016-03-14 | Disposition: A | Discharge status: Home / Self Care | Payer: Commercial Managed Care - HMO | Source: Ambulatory Visit | Attending: Family Medicine | Admitting: Family Medicine

## 2016-03-14 VITALS — BP 124/86 | HR 74 | Resp 16 | Wt 239.0 lb

## 2016-03-14 DIAGNOSIS — M5441 Lumbago with sciatica, right side: Secondary | ICD-10-CM

## 2016-03-14 DIAGNOSIS — M5442 Lumbago with sciatica, left side: Secondary | ICD-10-CM

## 2016-03-14 DIAGNOSIS — M545 Low back pain: Secondary | ICD-10-CM | POA: Diagnosis not present

## 2016-03-14 DIAGNOSIS — M5136 Other intervertebral disc degeneration, lumbar region: Secondary | ICD-10-CM | POA: Diagnosis not present

## 2016-03-14 DIAGNOSIS — S3992XA Unspecified injury of lower back, initial encounter: Secondary | ICD-10-CM | POA: Diagnosis not present

## 2016-03-14 MED ORDER — KETOROLAC TROMETHAMINE 60 MG/2ML IM SOLN
60.0000 mg | Freq: Once | INTRAMUSCULAR | Status: AC
  Administered 2016-03-14: 60 mg via INTRAMUSCULAR

## 2016-03-14 MED ORDER — TIZANIDINE HCL 4 MG PO TABS: 4.0000 mg | ORAL_TABLET | Freq: Two times a day (BID) | ORAL | Status: DC | PRN

## 2016-03-14 MED ORDER — METHYLPREDNISOLONE ACETATE 80 MG/ML IJ SUSP
80.0000 mg | Freq: Once | INTRAMUSCULAR | Status: AC
  Administered 2016-03-14: 80 mg via INTRAMUSCULAR

## 2016-03-14 NOTE — Patient Instructions (Addendum)
Good to se eyou  2 injections today  Xray downstairs to see if anything has changed. No news is good news.  Mild range of motion exercises for now would be good. Only 3 times a week. Ice 20 minutes 2 times daily. Usually after activity and before bed. Zanaflex up to 2 times daily  See me again in 2-3 weeks if not better

## 2016-03-14 NOTE — Assessment & Plan Note (Signed)
Patient has known degenerative disc disease of the lumbar spine and has had a 3 level fusion previously. Patient states he has had a metal rod. This will be new from previous imaging. X-rays ordered today and are pending. Patient given an injection of Toradol as well as Depo-Medrol. Hopefully this will help with pain relief. Warned of potential side effects. We discussed icing regimen. Discussed which activities to do in which ones to potentially avoid. Discussed range of motion exercises but only 3 times a week. Follow-up again in 2-3 weeks. Patient has a severe worsening symptoms he needs to seek medical attention immediately. Small dose muscle relaxer given to see how patient response.  Spent  25 minutes with patient face-to-face and had greater than 50% of counseling including as described above in assessment and plan.
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
## 2016-03-14 NOTE — Progress Notes (Signed)
Pre visit review using our clinic review tool, if applicable. No additional management support is needed unless otherwise documented below in the visit note. 

## 2016-04-03 ENCOUNTER — Ambulatory Visit: Payer: Commercial Managed Care - HMO | Admitting: Family Medicine

## 2016-04-25 ENCOUNTER — Other Ambulatory Visit (INDEPENDENT_AMBULATORY_CARE_PROVIDER_SITE_OTHER): Payer: Commercial Managed Care - HMO

## 2016-04-25 ENCOUNTER — Telehealth: Payer: Self-pay

## 2016-04-25 DIAGNOSIS — Z0000 Encounter for general adult medical examination without abnormal findings: Secondary | ICD-10-CM

## 2016-04-25 DIAGNOSIS — R7989 Other specified abnormal findings of blood chemistry: Secondary | ICD-10-CM

## 2016-04-25 LAB — HEPATIC FUNCTION PANEL
ALBUMIN: 4.1 g/dL (ref 3.5–5.2)
ALK PHOS: 62 U/L (ref 39–117)
ALT: 54 U/L — ABNORMAL HIGH (ref 0–53)
AST: 30 U/L (ref 0–37)
Bilirubin, Direct: 0.1 mg/dL (ref 0.0–0.3)
TOTAL PROTEIN: 6.8 g/dL (ref 6.0–8.3)
Total Bilirubin: 0.8 mg/dL (ref 0.2–1.2)

## 2016-04-25 LAB — IBC PANEL
IRON: 251 ug/dL — AB (ref 42–165)
Saturation Ratios: 87.5 % — ABNORMAL HIGH (ref 20.0–50.0)
TRANSFERRIN: 205 mg/dL — AB (ref 212.0–360.0)

## 2016-04-25 LAB — TSH: TSH: 2.05 u[IU]/mL (ref 0.35–4.50)

## 2016-04-25 LAB — BASIC METABOLIC PANEL
BUN: 16 mg/dL (ref 6–23)
CALCIUM: 9.3 mg/dL (ref 8.4–10.5)
CO2: 29 mEq/L (ref 19–32)
CREATININE: 1.33 mg/dL (ref 0.40–1.50)
Chloride: 101 mEq/L (ref 96–112)
GFR: 58.48 mL/min — AB (ref >60.00)
GLUCOSE: 229 mg/dL — AB (ref 70–99)
Potassium: 4.2 mEq/L (ref 3.5–5.1)
SODIUM: 136 meq/L (ref 135–145)

## 2016-04-25 LAB — CBC
HCT: 42.9 % (ref 39.0–52.0)
HEMOGLOBIN: 14.8 g/dL (ref 13.0–17.0)
MCHC: 34.5 g/dL (ref 30.0–36.0)
MCV: 88.1 fl (ref 78.0–100.0)
PLATELETS: 194 10*3/uL (ref 150.0–400.0)
RBC: 4.87 Mil/uL (ref 4.22–5.81)
RDW: 14.2 % (ref 11.5–15.5)
WBC: 8.2 10*3/uL (ref 4.0–10.5)

## 2016-04-25 LAB — HEMOGLOBIN A1C: HEMOGLOBIN A1C: 8.3 % — AB (ref 4.6–6.5)

## 2016-04-25 LAB — LIPID PANEL
Cholesterol: 175 mg/dL (ref 0–200)
HDL: 25.2 mg/dL — AB (ref >39.00)
NONHDL: 149.45
TRIGLYCERIDES: 242 mg/dL — AB (ref 0.0–149.0)
Total CHOL/HDL Ratio: 7
VLDL: 48.4 mg/dL — AB (ref 0.0–40.0)

## 2016-04-25 LAB — LDL CHOLESTEROL, DIRECT: LDL DIRECT: 107 mg/dL

## 2016-04-25 LAB — FERRITIN: FERRITIN: 90.6 ng/mL (ref 22.0–322.0)

## 2016-04-25 LAB — PSA: PSA: 0.93 ng/mL (ref 0.10–4.00)

## 2016-04-25 NOTE — Telephone Encounter (Signed)
Orders placed for lab work. 

## 2016-04-29 ENCOUNTER — Ambulatory Visit (INDEPENDENT_AMBULATORY_CARE_PROVIDER_SITE_OTHER): Payer: Commercial Managed Care - HMO | Admitting: Internal Medicine

## 2016-04-29 ENCOUNTER — Encounter: Payer: Self-pay | Admitting: Internal Medicine

## 2016-04-29 VITALS — BP 130/72 | HR 76 | Temp 98.1°F | Resp 20 | Wt 235.0 lb

## 2016-04-29 DIAGNOSIS — Z1211 Encounter for screening for malignant neoplasm of colon: Secondary | ICD-10-CM | POA: Insufficient documentation

## 2016-04-29 DIAGNOSIS — R6889 Other general symptoms and signs: Secondary | ICD-10-CM

## 2016-04-29 DIAGNOSIS — E785 Hyperlipidemia, unspecified: Secondary | ICD-10-CM | POA: Diagnosis not present

## 2016-04-29 DIAGNOSIS — E119 Type 2 diabetes mellitus without complications: Secondary | ICD-10-CM

## 2016-04-29 DIAGNOSIS — Z0001 Encounter for general adult medical examination with abnormal findings: Secondary | ICD-10-CM | POA: Diagnosis not present

## 2016-04-29 DIAGNOSIS — Z0000 Encounter for general adult medical examination without abnormal findings: Secondary | ICD-10-CM

## 2016-04-29 DIAGNOSIS — I10 Essential (primary) hypertension: Secondary | ICD-10-CM | POA: Diagnosis not present

## 2016-04-29 MED ORDER — METFORMIN HCL ER 500 MG PO TB24: ORAL_TABLET | ORAL | 3 refills | Status: DC

## 2016-04-29 NOTE — Patient Instructions (Signed)
OK to increase the metformin to 4 pills per day  Please continue all other medications as before, and refills have been done if requested.  Please have the pharmacy call with any other refills you may need.  Please continue your efforts at being more active, low cholesterol diet, and weight control.  You are otherwise up to date with prevention measures today.  Please keep your appointments with your specialists as you may have planned  You will be contacted regarding the referral for: Dr Pyrtle/GI in 3 mo  Please go to the LAB in the Basement (turn left off the elevator) for the tests to be done today  You will be contacted by phone if any changes need to be made immediately.  Otherwise, you will receive a letter about your results with an explanation, but please check with MyChart first.  Please remember to sign up for MyChart if you have not done so, as this will be important to you in the future with finding out test results, communicating by private email, and scheduling acute appointments online when needed.  Please return in 6 months, or sooner if needed, with Lab testing done 3-5 days before

## 2016-04-29 NOTE — Assessment & Plan Note (Signed)
Has been statin intolerant for unusual cough assoc,  to f/u any worsening symptoms or concerns, cont low chol diet

## 2016-04-29 NOTE — Assessment & Plan Note (Signed)

## 2016-04-29 NOTE — Assessment & Plan Note (Signed)
Also due for GI f/u, will ask for appt in 3 mo with Dr Hilarie Fredrickson

## 2016-04-29 NOTE — Progress Notes (Signed)
Subjective:    Patient ID: Keith [name], male    DOB: 1956-07-05, 60 y.o.   MRN: [id number]  HPI  Here for wellness and f/u;  Overall doing ok;  Pt denies Chest pain, worsening SOB, DOE, wheezing, orthopnea, PND, worsening LE edema, palpitations, dizziness or syncope.  Pt denies neurological change such as new headache, facial or extremity weakness. Pt states overall good compliance with treatment and medications, good tolerability, and has been trying to follow appropriate diet.  Pt denies worsening depressive symptoms, suicidal ideation or panic. No fever, night sweats, wt loss, loss of appetite, or other constitutional symptoms.  Pt states good ability with ADL's, has low fall risk, home safety reviewed and adequate, no other significant changes in hearing or vision, and only occasionally active with exercise.  Did not tolerate statin due to cough per pt, seemed to happen twice with 2 tries.  Due for f/u with GI for hemochromatosis, labs today c/w ferriitin and percent sat nearing phlebotomy need levels  Pt denies polydipsia, polyuria, or low sugar symptoms such as weakness or confusion improved with po intake.  Pt states overall good compliance with meds, trying to follow lower cholesterol, diabetic diet, wt overall stable but little exercise however.   Pt continues to have recurring LBP without change in severity, bowel or bladder change, fever, wt loss,  worsening LE pain/numbness/weakness, gait change or falls. Cont;s to be disabled due to ongoing back pain for 20 yrs Past Medical History:  Diagnosis Date  . Anxiety   . Arthritis   . Cardiomyopathy    Echo (5/11) showed EF 30% with diffuse hypokinesis, normal wall thickness, mildly decreased RV systolic function. This may be a cardiomyopathy due to hemochromatosis versus tachy-mediated. Unable to get cardiac MRI due to claustrophobia.  . Cervical spine pain    Chronic  . Chest pain    Exercise/adenosine Myoview showed EF 50% (3/10).  Inferior hypokinesis. Perfusion images, inferior scar and ischemia in the apical anterior septum and in teh inferior wall . Suggestive of a CD. LHC (3/10) showed only lu minal irregulatrities in the coronaries w/ EF 55%, suggesting that myoview was a false positive, likely from diaphragmatic attnuation.  . Diabetes mellitus   . Diverticulosis   . Fatty liver   . GERD (gastroesophageal reflux disease)   . Heart murmur   . Hereditary hemochromatosis    Compound heterozygote. Pt gets periodic phlebotomies  . History of colonic polyps   . Hyperlipidemia   . Hypertension   . Low back pain    The pt is S/P spinal fusion surgeries  . Multifocal atrial tachycardia (HCC)    Wandering atrial pacemaker. Holter monior in 5/11 showed frequent runs of symtomatic MAT with heart rate around 100. Pt is now  on amiodarone to try to suppress MAT. PFTs (6/11): FVC 105%, FEV1 109%, TLC 112%, DLCO 87% (minimal obstructive effect). LFTs stable on amiodarone  . Peptic ulcer disease   . Thyroid nodule    Past Surgical History:  Procedure Laterality Date  . BACK SURGERY  1991   Lumbar  . INGUINAL HERNIA REPAIR    . SPINE SURGERY    . TONSILLECTOMY      reports that he quit smoking about 12 years ago. He has never used smokeless tobacco. He reports that he drinks about 0.5 oz of alcohol per week . He reports that he uses drugs. family history includes Alcohol abuse in his father; Colitis in his mother; Colon polyps in his  mother; Diabetes in his mother, paternal grandfather, and paternal grandmother; Hypertension in his father; Multiple sclerosis in his mother and sister. Allergies  Allergen Reactions  . Lipitor [Atorvastatin]     myalgias  . Metoclopramide Hcl   . Penicillins   . Sertraline Hcl    Current Outpatient Prescriptions on File Prior to Visit  Medication Sig Dispense Refill  . aspirin 81 MG EC tablet Take 81 mg by mouth daily.      . cetirizine (ZYRTEC) 10 MG tablet Take 10 mg by mouth daily  as needed for allergies.    . Cholecalciferol (VITAMIN D-3 PO) Take 1 tablet by mouth daily. PATIENT TAKES ONCE TABLET BY MOUTH ONCE DAILY . PATIENT NOT SURE OF DOSEAGE.    Marland Kitchen diazepam (VALIUM) 5 MG tablet Take 1 tablet (5 mg total) by mouth daily as needed for anxiety. 30 tablet 1  . glipiZIDE (GLUCOTROL XL) 10 MG 24 hr tablet Take 1 tablet (10 mg total) by mouth daily with breakfast. 90 tablet 3  . glucosamine-chondroitin 500-400 MG tablet Take 1 tablet by mouth 3 (three) times daily.    Marland Kitchen ibuprofen (ADVIL,MOTRIN) 400 MG tablet Take 400 mg by mouth every 6 (six) hours as needed for headache or moderate pain. Reported on 10/30/2015    . levothyroxine (SYNTHROID, LEVOTHROID) 25 MCG tablet TAKE 1 TABLET EVERY DAY 90 tablet 2  . losartan (COZAAR) 50 MG tablet TAKE 1 TABLET EVERY DAY 90 tablet 1  . metoprolol succinate (TOPROL-XL) 100 MG 24 hr tablet TAKE 1 TABLET TWICE DAILY 180 tablet 2  . Omega-3 Fatty Acids (FISH OIL PO) Take 1 tablet by mouth daily.     . pantoprazole (PROTONIX) 40 MG tablet TAKE 1 TABLET EVERY DAY 90 tablet 2  . tiZANidine (ZANAFLEX) 4 MG tablet Take 1 tablet (4 mg total) by mouth every 12 (twelve) hours as needed for muscle spasms. 30 tablet 2  . triamcinolone (NASACORT AQ) 55 MCG/ACT AERO nasal inhaler Place 2 sprays into the nose daily. 1 Inhaler 12  . TURMERIC PO Take 1 tablet by mouth daily. Patient takes one tablet once daily. Patient not sure of doseage.     No current facility-administered medications on file prior to visit.    Review of Systems Constitutional: Negative for increased diaphoresis, or other activity, appetite or siginficant weight change other than noted HENT: Negative for worsening hearing loss, ear pain, facial swelling, mouth sores and neck stiffness.   Eyes: Negative for other worsening pain, redness or visual disturbance.  Respiratory: Negative for choking or stridor Cardiovascular: Negative for other chest pain and palpitations.    Gastrointestinal: Negative for worsening diarrhea, blood in stool, or abdominal distention Genitourinary: Negative for hematuria, flank pain or change in urine volume.  Musculoskeletal: Negative for myalgias or other joint complaints.  Skin: Negative for other color change and wound or drainage.  Neurological: Negative for syncope and numbness. other than noted Hematological: Negative for adenopathy. or other swelling Psychiatric/Behavioral: Negative for hallucinations, SI, self-injury, decreased concentration or other worsening agitation.      Objective:   Physical Exam BP 130/72   Pulse 76   Temp 98.1 F (36.7 C) (Oral)   Resp 20   Wt 235 lb (106.6 kg)   SpO2 95%   BMI 33.72 kg/m  VS noted,  Constitutional: Pt is oriented to person, place, and time. Appears well-developed and well-nourished, in no significant distress Head: Normocephalic and atraumatic  Eyes: Conjunctivae and EOM are normal. Pupils are equal, round,  and reactive to light Right Ear: External ear normal.  Left Ear: External ear normal Nose: Nose normal.  Mouth/Throat: Oropharynx is clear and moist  Neck: Normal range of motion. Neck supple. No JVD present. No tracheal deviation present or significant neck LA or mass Cardiovascular: Normal rate, regular rhythm, normal heart sounds and intact distal pulses.   Pulmonary/Chest: Effort normal and breath sounds without rales or wheezing  Abdominal: Soft. Bowel sounds are normal. NT. No HSM  Musculoskeletal: Normal range of motion. Exhibits no edema Lymphadenopathy: Has no cervical adenopathy.  Neurological: Pt is alert and oriented to person, place, and time. Pt has normal reflexes. No cranial nerve deficit. Motor grossly intact Skin: Skin is warm and dry. No rash noted or new ulcers Psychiatric:  Has normal mood and affect. Behavior is normal.    DG Lumbar Spine Complete  IMPRESSION: No acute fracture or dislocation is observed. The fusion hardware appears  intact. There is degenerative disc space narrowing at L2-3 with considerable facet joint hypertrophy at the same level.    Assessment & Plan:

## 2016-04-29 NOTE — Progress Notes (Signed)
Pre visit review using our clinic review tool, if applicable. No additional management support is needed unless otherwise documented below in the visit note. 

## 2016-04-29 NOTE — Assessment & Plan Note (Signed)
stable overall by history and exam, recent data reviewed with pt, and pt to continue medical treatment as before,  to f/u any worsening symptoms or concerns BP Readings from Last 3 Encounters:  04/29/16 130/72  03/14/16 124/86  10/30/15 122/82

## 2016-04-29 NOTE — Assessment & Plan Note (Signed)
Uncontrolled, to incresae the metformin er to 4 tabs per day, cont diet, wt loss efforts

## 2016-05-05 ENCOUNTER — Other Ambulatory Visit: Payer: Self-pay | Admitting: Internal Medicine

## 2016-07-03 DIAGNOSIS — H524 Presbyopia: Secondary | ICD-10-CM | POA: Diagnosis not present

## 2016-07-09 ENCOUNTER — Other Ambulatory Visit (INDEPENDENT_AMBULATORY_CARE_PROVIDER_SITE_OTHER): Payer: Commercial Managed Care - HMO

## 2016-07-09 ENCOUNTER — Ambulatory Visit: Payer: Commercial Managed Care - HMO | Admitting: Internal Medicine

## 2016-07-09 ENCOUNTER — Ambulatory Visit (INDEPENDENT_AMBULATORY_CARE_PROVIDER_SITE_OTHER): Payer: Commercial Managed Care - HMO | Admitting: Internal Medicine

## 2016-07-09 ENCOUNTER — Encounter: Payer: Self-pay | Admitting: Internal Medicine

## 2016-07-09 DIAGNOSIS — K219 Gastro-esophageal reflux disease without esophagitis: Secondary | ICD-10-CM | POA: Diagnosis not present

## 2016-07-09 LAB — COMPREHENSIVE METABOLIC PANEL
ALT: 49 U/L (ref 0–53)
AST: 31 U/L (ref 0–37)
Albumin: 4.3 g/dL (ref 3.5–5.2)
Alkaline Phosphatase: 65 U/L (ref 39–117)
BILIRUBIN TOTAL: 0.5 mg/dL (ref 0.2–1.2)
BUN: 15 mg/dL (ref 6–23)
CHLORIDE: 101 meq/L (ref 96–112)
CO2: 26 meq/L (ref 19–32)
Calcium: 9.7 mg/dL (ref 8.4–10.5)
Creatinine, Ser: 1.11 mg/dL (ref 0.40–1.50)
GFR: 72 mL/min (ref >60.00)
GLUCOSE: 187 mg/dL — AB (ref 70–99)
Potassium: 4.2 mEq/L (ref 3.5–5.1)
Sodium: 137 mEq/L (ref 135–145)
Total Protein: 7.7 g/dL (ref 6.0–8.3)

## 2016-07-09 LAB — IBC PANEL
Iron: 69 ug/dL (ref 42–165)
SATURATION RATIOS: 20.8 % (ref 20.0–50.0)
TRANSFERRIN: 237 mg/dL (ref 212.0–360.0)

## 2016-07-09 LAB — CBC WITH DIFFERENTIAL/PLATELET
BASOS PCT: 0.5 % (ref 0.0–3.0)
Basophils Absolute: 0 10*3/uL (ref 0.0–0.1)
EOS ABS: 0.3 10*3/uL (ref 0.0–0.7)
Eosinophils Relative: 3 % (ref 0.0–5.0)
HEMATOCRIT: 45.5 % (ref 39.0–52.0)
HEMOGLOBIN: 15.7 g/dL (ref 13.0–17.0)
LYMPHS PCT: 26.8 % (ref 12.0–46.0)
Lymphs Abs: 2.4 10*3/uL (ref 0.7–4.0)
MCHC: 34.4 g/dL (ref 30.0–36.0)
MCV: 89.6 fl (ref 78.0–100.0)
MONO ABS: 0.8 10*3/uL (ref 0.1–1.0)
Monocytes Relative: 9.3 % (ref 3.0–12.0)
Neutro Abs: 5.5 10*3/uL (ref 1.4–7.7)
Neutrophils Relative %: 60.4 % (ref 43.0–77.0)
Platelets: 190 10*3/uL (ref 150.0–400.0)
RBC: 5.08 Mil/uL (ref 4.22–5.81)
RDW: 13.4 % (ref 11.5–15.5)
WBC: 9.1 10*3/uL (ref 4.0–10.5)

## 2016-07-09 LAB — FERRITIN: FERRITIN: 79.3 ng/mL (ref 22.0–322.0)

## 2016-07-09 LAB — PROTIME-INR
INR: 1.1 ratio — ABNORMAL HIGH (ref 0.8–1.0)
PROTHROMBIN TIME: 11.1 s (ref 9.6–13.1)

## 2016-07-09 MED ORDER — PANTOPRAZOLE SODIUM 40 MG PO TBEC: 40.0000 mg | DELAYED_RELEASE_TABLET | Freq: Every day | ORAL | 3 refills | Status: DC
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
## 2016-07-09 NOTE — Patient Instructions (Signed)
Your physician has requested that you go to the basement for  lab work before leaving today.  We have sent the following prescriptions to your mail in pharmacy: Protonix.   If you have not heard from your mail in pharmacy within 1 week or if you have not received your medication in the mail, please contact us at [phone] so we may find out why.  You can take your Protonix twice daily for break through symptoms.

## 2016-07-14 ENCOUNTER — Other Ambulatory Visit: Payer: Self-pay

## 2016-07-14 ENCOUNTER — Other Ambulatory Visit: Payer: Self-pay | Admitting: Internal Medicine

## 2016-10-06 ENCOUNTER — Other Ambulatory Visit: Payer: Self-pay | Admitting: Internal Medicine

## 2016-11-03 ENCOUNTER — Other Ambulatory Visit (INDEPENDENT_AMBULATORY_CARE_PROVIDER_SITE_OTHER): Payer: Commercial Managed Care - HMO

## 2016-11-03 DIAGNOSIS — E119 Type 2 diabetes mellitus without complications: Secondary | ICD-10-CM

## 2016-11-03 LAB — HEPATIC FUNCTION PANEL
ALBUMIN: 4.2 g/dL (ref 3.5–5.2)
ALT: 76 U/L — ABNORMAL HIGH (ref 0–53)
AST: 51 U/L — ABNORMAL HIGH (ref 0–37)
Alkaline Phosphatase: 66 U/L (ref 39–117)
BILIRUBIN TOTAL: 0.6 mg/dL (ref 0.2–1.2)
Bilirubin, Direct: 0.1 mg/dL (ref 0.0–0.3)
Total Protein: 7.3 g/dL (ref 6.0–8.3)

## 2016-11-03 LAB — BASIC METABOLIC PANEL
BUN: 14 mg/dL (ref 6–23)
CALCIUM: 9.5 mg/dL (ref 8.4–10.5)
CO2: 28 mEq/L (ref 19–32)
CREATININE: 1.04 mg/dL (ref 0.40–1.50)
Chloride: 101 mEq/L (ref 96–112)
GFR: 77.53 mL/min (ref >60.00)
Glucose, Bld: 188 mg/dL — ABNORMAL HIGH (ref 70–99)
POTASSIUM: 3.8 meq/L (ref 3.5–5.1)
Sodium: 137 mEq/L (ref 135–145)

## 2016-11-03 LAB — LIPID PANEL
CHOL/HDL RATIO: 8
CHOLESTEROL: 193 mg/dL (ref 0–200)
HDL: 25.7 mg/dL — AB (ref >39.00)
NonHDL: 167.39
TRIGLYCERIDES: 342 mg/dL — AB (ref 0.0–149.0)
VLDL: 68.4 mg/dL — AB (ref 0.0–40.0)

## 2016-11-03 LAB — LDL CHOLESTEROL, DIRECT: LDL DIRECT: 110 mg/dL

## 2016-11-03 LAB — HEMOGLOBIN A1C: Hgb A1c MFr Bld: 8.9 % — ABNORMAL HIGH (ref 4.6–6.5)

## 2016-11-04 ENCOUNTER — Encounter: Payer: Self-pay | Admitting: Internal Medicine

## 2016-11-04 ENCOUNTER — Ambulatory Visit (INDEPENDENT_AMBULATORY_CARE_PROVIDER_SITE_OTHER): Payer: Medicare HMO | Admitting: Internal Medicine

## 2016-11-04 VITALS — BP 132/80 | HR 71 | Temp 96.8°F | Ht 70.5 in | Wt 239.0 lb

## 2016-11-04 DIAGNOSIS — I10 Essential (primary) hypertension: Secondary | ICD-10-CM | POA: Diagnosis not present

## 2016-11-04 DIAGNOSIS — Z0001 Encounter for general adult medical examination with abnormal findings: Secondary | ICD-10-CM

## 2016-11-04 DIAGNOSIS — E119 Type 2 diabetes mellitus without complications: Secondary | ICD-10-CM

## 2016-11-04 DIAGNOSIS — E785 Hyperlipidemia, unspecified: Secondary | ICD-10-CM

## 2016-11-04 MED ORDER — CANAGLIFLOZIN 300 MG PO TABS: 300.0000 mg | ORAL_TABLET | Freq: Every day | ORAL | 3 refills | Status: DC

## 2016-11-04 MED ORDER — EZETIMIBE 10 MG PO TABS: 10.0000 mg | ORAL_TABLET | Freq: Every day | ORAL | 3 refills | Status: DC

## 2016-11-04 NOTE — Assessment & Plan Note (Signed)
Uncontrolled, to add invokana 300 qd, o/w stable overall by history and exam, recent data reviewed with pt, and pt to continue medical treatment as before,  to f/u any worsening symptoms or concerns Lab Results  Component Value Date   HGBA1C 8.9 (H) 11/03/2016

## 2016-11-04 NOTE — Assessment & Plan Note (Signed)
Mild persistent elevated, goal < 70, has been statin intolerant, for zetia 10 qd,  F/u lab next visit, urged DM diet compliance and wt loss as well

## 2016-11-04 NOTE — Progress Notes (Signed)
Subjective:    Patient ID: Keith [name], male    DOB: [birth year], 61 y.o.   MRN: [id number]  HPI   Here to f/u; overall doing ok,  Pt denies chest pain, increasing sob or doe, wheezing, orthopnea, PND, increased LE swelling, palpitations, dizziness or syncope.  Pt denies new neurological symptoms such as new headache, or facial or extremity weakness or numbness.  Pt denies polydipsia, polyuria, or low sugar episode.   Pt denies new neurological symptoms such as new headache, or facial or extremity weakness or numbness.   Pt states overall good compliance with meds, admits to not really trying to follow appropriate diet, with wt overall up several lbs,  but little exercise however. Does mention chronic ST and roof of mought for 1 yr noticed first in the AM, breathes through mouth at night, gone after drinking some fluids. Wt Readings from Last 3 Encounters:  11/04/16 239 lb (108.4 kg)  07/09/16 237 lb 6.4 oz (107.7 kg)  04/29/16 235 lb (106.6 kg)   BP Readings from Last 3 Encounters:  11/04/16 132/80  07/09/16 118/80  04/29/16 130/72   Past Medical History:  Diagnosis Date  . Anxiety   . Arthritis   . Cardiomyopathy    Echo (5/11) showed EF 30% with diffuse hypokinesis, normal wall thickness, mildly decreased RV systolic function. This may be a cardiomyopathy due to hemochromatosis versus tachy-mediated. Unable to get cardiac MRI due to claustrophobia.  . Cervical spine pain    Chronic  . Chest pain    Exercise/adenosine Myoview showed EF 50% (3/10). Inferior hypokinesis. Perfusion images, inferior scar and ischemia in the apical anterior septum and in teh inferior wall . Suggestive of a CD. LHC (3/10) showed only lu minal irregulatrities in the coronaries w/ EF 55%, suggesting that myoview was a false positive, likely from diaphragmatic attnuation.  . Diabetes mellitus   . Diverticulosis   . Fatty liver   . GERD (gastroesophageal reflux disease)   . Heart murmur   . Hereditary  hemochromatosis    Compound heterozygote. Pt gets periodic phlebotomies  . History of colonic polyps   . Hyperlipidemia   . Hypertension   . Low back pain    The pt is S/P spinal fusion surgeries  . Multifocal atrial tachycardia (HCC)    Wandering atrial pacemaker. Holter monior in 5/11 showed frequent runs of symtomatic MAT with heart rate around 100. Pt is now  on amiodarone to try to suppress MAT. PFTs (6/11): FVC 105%, FEV1 109%, TLC 112%, DLCO 87% (minimal obstructive effect). LFTs stable on amiodarone  . Peptic ulcer disease   . Thyroid nodule    Past Surgical History:  Procedure Laterality Date  . BACK SURGERY  1991   Lumbar  . INGUINAL HERNIA REPAIR    . SPINE SURGERY    . TONSILLECTOMY      reports that he quit smoking about 13 years ago. He has never used smokeless tobacco. He reports that he drinks about 0.5 oz of alcohol per week . He reports that he uses drugs. family history includes Alcohol abuse in his father; Colitis in his mother; Colon polyps in his mother; Diabetes in his mother, paternal grandfather, and paternal grandmother; Hypertension in his father; Multiple sclerosis in his mother and sister. Allergies  Allergen Reactions  . Lipitor [Atorvastatin]     myalgias  . Metoclopramide Hcl   . Penicillins   . Sertraline Hcl    Current Outpatient Prescriptions on File Prior to  Visit  Medication Sig Dispense Refill  . aspirin 81 MG EC tablet Take 81 mg by mouth daily.      . cetirizine (ZYRTEC) 10 MG tablet Take 10 mg by mouth daily as needed for allergies.    . Cholecalciferol (VITAMIN D-3 PO) Take 1 tablet by mouth daily. PATIENT TAKES ONCE TABLET BY MOUTH ONCE DAILY . PATIENT NOT SURE OF DOSEAGE.    Marland Kitchen diazepam (VALIUM) 5 MG tablet Take 1 tablet (5 mg total) by mouth daily as needed for anxiety. 30 tablet 1  . glipiZIDE (GLUCOTROL XL) 10 MG 24 hr tablet TAKE 1 TABLET DAILY WITH BREAKFAST. 90 tablet 1  . glucosamine-chondroitin 500-400 MG tablet Take 1 tablet by  mouth 3 (three) times daily.    Marland Kitchen ibuprofen (ADVIL,MOTRIN) 400 MG tablet Take 400 mg by mouth every 6 (six) hours as needed for headache or moderate pain. Reported on 10/30/2015    . levothyroxine (SYNTHROID, LEVOTHROID) 25 MCG tablet TAKE 1 TABLET EVERY DAY 90 tablet 1  . losartan (COZAAR) 50 MG tablet TAKE 1 TABLET EVERY DAY 90 tablet 2  . metFORMIN (GLUCOPHAGE-XR) 500 MG 24 hr tablet TAKE 4 TABLETS EVERY DAY  WITH  BREAKFAST 360 tablet 3  . metoprolol succinate (TOPROL-XL) 100 MG 24 hr tablet TAKE 1 TABLET TWICE DAILY 180 tablet 2  . Omega-3 Fatty Acids (FISH OIL PO) Take 1 tablet by mouth daily.     . pantoprazole (PROTONIX) 40 MG tablet TAKE 1 TABLET EVERY DAY 90 tablet 2  . triamcinolone (NASACORT AQ) 55 MCG/ACT AERO nasal inhaler Place 2 sprays into the nose daily. 1 Inhaler 12   No current facility-administered medications on file prior to visit.     Review of Systems  Constitutional: Negative for unusual diaphoresis or night sweats HENT: Negative for ear swelling or discharge Eyes: Negative for worsening visual haziness  Respiratory: Negative for choking and stridor.   Gastrointestinal: Negative for distension or worsening eructation Genitourinary: Negative for retention or change in urine volume.  Musculoskeletal: Negative for other MSK pain or swelling Skin: Negative for color change and worsening wound Neurological: Negative for tremors and numbness other than noted  Psychiatric/Behavioral: Negative for decreased concentration or agitation other than above   All other system neg per pt    Objective:   Physical Exam BP 132/80   Pulse 71   Temp (!) 96.8 F (36 C)   Ht 5' 10.5" (1.791 m)   Wt 239 lb (108.4 kg)   SpO2 96%   BMI 33.81 kg/m  VS noted,  Constitutional: Pt appears in no apparent distress HENT: Head: NCAT.  Right Ear: External ear normal.  Left Ear: External ear normal.  Eyes: . Pupils are equal, round, and reactive to light. Conjunctivae and EOM are  normal Pharynx and roof of mouth without ulcer, mass or erythema or swelling Neck: Normal range of motion. Neck supple.  Cardiovascular: Normal rate and regular rhythm.   Pulmonary/Chest: Effort normal and breath sounds without rales or wheezing.  Neurological: Pt is alert. Not confused , motor grossly intact Skin: Skin is warm. No rash, no LE edema Psychiatric: Pt behavior is normal. No agitation.  No other new exam findings    Assessment & Plan:

## 2016-11-04 NOTE — Assessment & Plan Note (Signed)
stable overall by history and exam, recent data reviewed with pt, and pt to continue medical treatment as before,  to f/u any worsening symptoms or concerns BP Readings from Last 3 Encounters:  11/04/16 132/80  07/09/16 118/80  04/29/16 130/72

## 2016-11-04 NOTE — Patient Instructions (Signed)
Please take all new medication as prescribed - the zetia 10 mg for cholesterol, and the Invokana for sugar (sent to [place])  Please have the pharmacy call with a similar medication name if the invokana is not covered  Please continue all other medications as before, and refills have been done if requested.  Please have the pharmacy call with any other refills you may need.  Please continue your efforts at being more active, low cholesterol diet, and weight control  Please keep your appointments with your specialists as you may have planned  Please return in 6 months, or sooner if needed, with Lab testing done 3-5 days before

## 2016-12-08 ENCOUNTER — Other Ambulatory Visit: Payer: Self-pay | Admitting: Internal Medicine

## 2016-12-22 ENCOUNTER — Telehealth: Payer: Self-pay

## 2016-12-22 NOTE — Telephone Encounter (Signed)
-----   Message from Algernon Huxley, RN sent at 07/14/2016  3:03 PM EDT ----- Regarding: Labs Pt needs labs in 6 mth, orders in epic

## 2016-12-22 NOTE — Telephone Encounter (Signed)
Letter mailed to pt.  

## 2017-01-02 ENCOUNTER — Other Ambulatory Visit (INDEPENDENT_AMBULATORY_CARE_PROVIDER_SITE_OTHER): Payer: Medicare HMO

## 2017-01-02 LAB — CBC WITH DIFFERENTIAL/PLATELET
BASOS ABS: 0 10*3/uL (ref 0.0–0.1)
Basophils Relative: 0.6 % (ref 0.0–3.0)
EOS PCT: 2.6 % (ref 0.0–5.0)
Eosinophils Absolute: 0.2 10*3/uL (ref 0.0–0.7)
HEMATOCRIT: 45 % (ref 39.0–52.0)
HEMOGLOBIN: 15.5 g/dL (ref 13.0–17.0)
LYMPHS ABS: 2.2 10*3/uL (ref 0.7–4.0)
LYMPHS PCT: 30 % (ref 12.0–46.0)
MCHC: 34.3 g/dL (ref 30.0–36.0)
MCV: 88.9 fl (ref 78.0–100.0)
MONOS PCT: 8.9 % (ref 3.0–12.0)
Monocytes Absolute: 0.6 10*3/uL (ref 0.1–1.0)
NEUTROS PCT: 57.9 % (ref 43.0–77.0)
Neutro Abs: 4.2 10*3/uL (ref 1.4–7.7)
Platelets: 206 10*3/uL (ref 150.0–400.0)
RBC: 5.07 Mil/uL (ref 4.22–5.81)
RDW: 13.8 % (ref 11.5–15.5)
WBC: 7.3 10*3/uL (ref 4.0–10.5)

## 2017-01-02 LAB — COMPREHENSIVE METABOLIC PANEL
ALBUMIN: 4.1 g/dL (ref 3.5–5.2)
ALK PHOS: 63 U/L (ref 39–117)
ALT: 44 U/L (ref 0–53)
AST: 26 U/L (ref 0–37)
BILIRUBIN TOTAL: 0.6 mg/dL (ref 0.2–1.2)
BUN: 14 mg/dL (ref 6–23)
CO2: 28 mEq/L (ref 19–32)
Calcium: 9.5 mg/dL (ref 8.4–10.5)
Chloride: 101 mEq/L (ref 96–112)
Creatinine, Ser: 1.12 mg/dL (ref 0.40–1.50)
GFR: 71.14 mL/min (ref >60.00)
GLUCOSE: 173 mg/dL — AB (ref 70–99)
Potassium: 4.2 mEq/L (ref 3.5–5.1)
SODIUM: 137 meq/L (ref 135–145)
TOTAL PROTEIN: 7.3 g/dL (ref 6.0–8.3)

## 2017-01-02 LAB — FERRITIN: FERRITIN: 53.4 ng/mL (ref 22.0–322.0)

## 2017-01-02 LAB — IBC PANEL
Iron: 72 ug/dL (ref 42–165)
SATURATION RATIOS: 22.5 % (ref 20.0–50.0)
Transferrin: 229 mg/dL (ref 212.0–360.0)

## 2017-01-05 ENCOUNTER — Other Ambulatory Visit: Payer: Self-pay

## 2017-01-19 ENCOUNTER — Telehealth: Payer: Self-pay | Admitting: Internal Medicine

## 2017-01-19 NOTE — Telephone Encounter (Signed)
Attempted to call patient regarding awv. Lvm for patient to call office to schedule appt.

## 2017-01-21 ENCOUNTER — Encounter: Payer: Self-pay | Admitting: Emergency Medicine

## 2017-01-21 ENCOUNTER — Ambulatory Visit (INDEPENDENT_AMBULATORY_CARE_PROVIDER_SITE_OTHER): Payer: Medicare HMO | Admitting: Emergency Medicine

## 2017-01-21 ENCOUNTER — Telehealth: Payer: Self-pay | Admitting: Family Medicine

## 2017-01-21 VITALS — BP 132/88 | HR 68 | Temp 98.8°F | Resp 18 | Ht 70.5 in | Wt 233.4 lb

## 2017-01-21 DIAGNOSIS — H60391 Other infective otitis externa, right ear: Secondary | ICD-10-CM | POA: Diagnosis not present

## 2017-01-21 DIAGNOSIS — H9201 Otalgia, right ear: Secondary | ICD-10-CM

## 2017-01-21 MED ORDER — AZITHROMYCIN 250 MG PO TABS: ORAL_TABLET | ORAL | 0 refills | Status: DC

## 2017-01-21 MED ORDER — NEOMYCIN-COLIST-HC-THONZONIUM 3.3-3-10-0.5 MG/ML OT SUSP: 4.0000 [drp] | Freq: Four times a day (QID) | OTIC | 0 refills | Status: DC

## 2017-01-21 MED ORDER — DICLOFENAC SODIUM 75 MG PO TBEC: 75.0000 mg | DELAYED_RELEASE_TABLET | Freq: Two times a day (BID) | ORAL | 0 refills | Status: AC

## 2017-01-21 NOTE — Patient Instructions (Addendum)
     IF you received an x-ray today, you will receive an invoice from [facility]. Please contact [facility] at [phone] with questions or concerns regarding your invoice.   IF you received labwork today, you will receive an invoice from [facility]. Please contact [facility] at [phone] with questions or concerns regarding your invoice.   Our billing staff will not be able to assist you with questions regarding bills from these companies.  You will be contacted with the lab results as soon as they are available. The fastest way to get your results is to activate your My Chart account. Instructions are located on the last page of this paperwork. If you have not heard from [place] regarding the results in 2 weeks, please contact this office.      Otitis Externa Otitis externa is an infection of the outer ear canal. The outer ear canal is the area between the outside of the ear and the eardrum. Otitis externa is sometimes called "swimmer's ear." Follow these instructions at home:  If you were given antibiotic ear drops, use them as told by your doctor. Do not stop using them even if your condition gets better.  Take over-the-counter and prescription medicines only as told by your doctor.  Keep all follow-up visits as told by your doctor. This is important. How is this prevented?  Keep your ear dry. Use the corner of a towel to dry your ear after you swim or bathe.  Try not to scratch or put things in your ear. Doing these things makes it easier for germs to grow in your ear.  Avoid swimming in lakes, dirty water, or pools that may not have the right amount of a chemical called chlorine.  Consider making ear drops and putting 3 or 4 drops in each ear after you swim. Ask your doctor about how you can make ear drops. Contact a doctor if:  You have a fever.  After 3 days your ear is still red, swollen, or painful.  After 3 days you still have pus coming from your  ear.  Your redness, swelling, or pain gets worse.  You have a really bad headache.  You have redness, swelling, pain, or tenderness behind your ear. This information is not intended to replace advice given to you by your health care provider. Make sure you discuss any questions you have with your health care provider. Document Released: 02/18/2008 Document Revised: 09/27/2015 Document Reviewed: 06/11/2015 Elsevier Interactive Patient Education  2017 [employer].

## 2017-01-21 NOTE — Telephone Encounter (Signed)
PT CALLING STATING THAT HIS EAR DROPS NEED PRIOR AUTHORIZATION HE IS IN PAIN AND NEED IT AS SOON AS POSSIBLE

## 2017-01-21 NOTE — Progress Notes (Signed)
Keith [name] 61 y.o.   Chief Complaint  Patient presents with  . Ear Pain    right ear x 1 week    HISTORY OF PRESENT ILLNESS: This is a 61 y.o. male complaining of pain to right ear x 1 week.   Otalgia   There is pain in the right ear. This is a new problem. The current episode started in the past 7 days. The problem occurs constantly. The problem has been gradually worsening. There has been no fever. The pain is at a severity of 5/10. The pain is moderate. Associated symptoms include ear discharge (mild). Pertinent negatives include no abdominal pain, coughing, diarrhea, headaches, hearing loss, rash, rhinorrhea, sore throat or vomiting.     Prior to Admission medications   Medication Sig Start Date End Date Taking? Authorizing Provider  aspirin 81 MG EC tablet Take 81 mg by mouth daily.     Yes [provider]  cetirizine (ZYRTEC) 10 MG tablet Take 10 mg by mouth daily as needed for allergies.   Yes [provider]  Cholecalciferol (VITAMIN D-3 PO) Take 1 tablet by mouth daily. PATIENT TAKES ONCE TABLET BY MOUTH ONCE DAILY . PATIENT NOT SURE OF DOSEAGE.   Yes [provider]  diazepam (VALIUM) 5 MG tablet Take 1 tablet (5 mg total) by mouth daily as needed for anxiety. 11/03/13  Yes Biagio Borg, MD  glipiZIDE (GLUCOTROL XL) 10 MG 24 hr tablet TAKE 1 TABLET DAILY WITH BREAKFAST 12/08/16  Yes Biagio Borg, MD  glucosamine-chondroitin 500-400 MG tablet Take 1 tablet by mouth 3 (three) times daily.   Yes [provider]  ibuprofen (ADVIL,MOTRIN) 400 MG tablet Take 400 mg by mouth every 6 (six) hours as needed for headache or moderate pain. Reported on 10/30/2015   Yes [provider]  levothyroxine (SYNTHROID, LEVOTHROID) 25 MCG tablet TAKE 1 TABLET EVERY DAY 10/06/16  Yes Biagio Borg, MD  losartan (COZAAR) 50 MG tablet TAKE 1 TABLET EVERY DAY 10/06/16  Yes Biagio Borg, MD  metFORMIN (GLUCOPHAGE-XR) 500 MG 24 hr tablet TAKE 4 TABLETS  EVERY DAY  WITH  BREAKFAST 04/29/16  Yes Biagio Borg, MD  metoprolol succinate (TOPROL-XL) 100 MG 24 hr tablet TAKE 1 TABLET TWICE DAILY 10/06/16  Yes Biagio Borg, MD  Omega-3 Fatty Acids (FISH OIL PO) Take 1 tablet by mouth daily.    Yes [provider]  pantoprazole (PROTONIX) 40 MG tablet TAKE 1 TABLET EVERY DAY 10/06/16  Yes Biagio Borg, MD  triamcinolone (NASACORT AQ) 55 MCG/ACT AERO nasal inhaler Place 2 sprays into the nose daily. 10/30/15  Yes Biagio Borg, MD  canagliflozin [facility]) 300 MG TABS tablet Take 1 tablet (300 mg total) by mouth daily before breakfast. Patient not taking: Reported on 01/21/2017 11/04/16   Biagio Borg, MD  ezetimibe (ZETIA) 10 MG tablet Take 1 tablet (10 mg total) by mouth daily. Patient not taking: Reported on 01/21/2017 11/04/16   Biagio Borg, MD    Allergies  Allergen Reactions  . Lipitor [Atorvastatin]     myalgias  . Metoclopramide Hcl   . Penicillins   . Sertraline Hcl     Patient Active Problem List   Diagnosis Date Noted  . Encounter for well adult exam with abnormal findings 04/29/2016  . Degenerative disc disease, lumbar 03/14/2016  . Skin lesion 10/30/2015  . Atrial tachycardia ([place]) 09/05/2015  . Arthralgia 11/01/2014  . Subacromial bursitis 11/01/2014  . Bilateral  knee pain 10/26/2014  . Left shoulder pain 10/26/2014  . Bilateral hand pain 10/26/2013  . Right lateral epicondylitis 10/26/2013  . NICM (nonischemic cardiomyopathy) ([place]) 08/18/2013  . Right thyroid nodule 05/12/2013  . Hypothyroidism due to amiodarone 05/12/2012  . Abnormal TSH 11/13/2011  . Fatty infiltration of liver 08/01/2011  . Iron storage disease 08/01/2011  . Diverticulosis of colon (without mention of hemorrhage) 08/01/2011  . Palpitations 07/02/2011  . TRIGGER FINGER, RIGHT MIDDLE 11/12/2010  . FLATULENCE ERUCTATION AND GAS PAIN 10/11/2010  . Chronic systolic heart failure ([place]) 01/22/2010  . TACHYCARDIA 01/22/2010  . Cervicalgia 05/03/2009    . Hemochromatosis 01/23/2009  . OTHER SPECIFIED ARTHROPATHY MULTIPLE SITES 01/23/2009  . HEMOCHROMATOSIS 01/23/2009  . ABNORMAL STRESS ELECTROCARDIOGRAM 11/13/2008  . CHEST PAIN 11/02/2008  . TRANSAMINASES, SERUM, ELEVATED 11/02/2008  . Dysfunction of eustachian tube 05/02/2008  . OTITIS MEDIA, SEROUS 01/12/2008  . Diabetes ([place]) 11/02/2007  . ANXIETY 11/02/2007  . PEPTIC ULCER DISEASE 11/02/2007  . LOW BACK PAIN 11/02/2007  . Hyperlipidemia 06/07/2007  . Essential hypertension 06/07/2007  . GERD 06/07/2007  . POLYP, GALLBLADDER 06/07/2007  . COLONIC POLYPS, HX OF 06/07/2007  . FATTY LIVER DISEASE, HX OF 06/07/2007    Past Medical History:  Diagnosis Date  . Anxiety   . Arthritis   . Cardiomyopathy    Echo (5/11) showed EF 30% with diffuse hypokinesis, normal wall thickness, mildly decreased RV systolic function. This may be a cardiomyopathy due to hemochromatosis versus tachy-mediated. Unable to get cardiac MRI due to claustrophobia.  . Cervical spine pain    Chronic  . Chest pain    Exercise/adenosine Myoview showed EF 50% (3/10). Inferior hypokinesis. Perfusion images, inferior scar and ischemia in the apical anterior septum and in teh inferior wall . Suggestive of a CD. LHC (3/10) showed only lu minal irregulatrities in the coronaries w/ EF 55%, suggesting that myoview was a false positive, likely from diaphragmatic attnuation.  . Diabetes mellitus   . Diverticulosis   . Fatty liver   . GERD (gastroesophageal reflux disease)   . Heart murmur   . Hereditary hemochromatosis    Compound heterozygote. Pt gets periodic phlebotomies  . History of colonic polyps   . Hyperlipidemia   . Hypertension   . Low back pain    The pt is S/P spinal fusion surgeries  . Multifocal atrial tachycardia (HCC)    Wandering atrial pacemaker. Holter monior in 5/11 showed frequent runs of symtomatic MAT with heart rate around 100. Pt is now  on amiodarone to try to suppress MAT. PFTs (6/11):  FVC 105%, FEV1 109%, TLC 112%, DLCO 87% (minimal obstructive effect). LFTs stable on amiodarone  . Peptic ulcer disease   . Thyroid nodule     Past Surgical History:  Procedure Laterality Date  . BACK SURGERY  1991   Lumbar  . INGUINAL HERNIA REPAIR    . SPINE SURGERY    . TONSILLECTOMY      Social History   Social History  . Marital status: Married    Spouse name: [name]  . Number of children: 3  . Years of education: [name]   Occupational History  . Disabled Unemployed   Social History Main Topics  . Smoking status: Former Smoker    Quit date: 09/16/2003  . Smokeless tobacco: Never Used  . Alcohol use 0.5 oz/week    1 Standard drinks or equivalent per week     Comment: 3-4 drinks per month  . Drug use: Yes  Comment: Quit in 1980  . Sexual activity: Not on file   Other Topics Concern  . Not on file   Social History Narrative   Married   Lives in [place]   3 children   Daily caffeine use - 3 drinks daily   Does not get regular exercise      [employer] on file.    Family History  Problem Relation Age of Onset  . Colon polyps Mother   . Multiple sclerosis Mother   . Diabetes Mother   . Colitis Mother   . Hypertension Father   . Alcohol abuse Father   . Multiple sclerosis Sister   . Diabetes Paternal Grandmother   . Diabetes Paternal Grandfather   . Colon cancer Neg Hx      Review of Systems  Constitutional: Negative.  Negative for chills, fever and malaise/fatigue.  HENT: Positive for ear discharge (mild) and ear pain. Negative for congestion, hearing loss, nosebleeds, rhinorrhea, sinus pain and sore throat.   Eyes: Negative for discharge and redness.  Respiratory: Negative for cough and shortness of breath.   Cardiovascular: Negative for chest pain.  Gastrointestinal: Negative for abdominal pain, diarrhea, nausea and vomiting.  Genitourinary: Negative for dysuria and hematuria.  Skin: Negative for rash.  Neurological: Negative for  dizziness, focal weakness and headaches.  Endo/Heme/Allergies: Negative.   All other systems reviewed and are negative.    Vitals:   01/21/17 0930 01/21/17 1015  BP: (!) 152/93 132/88  Pulse: 68   Resp: 18   Temp: 98.8 F (37.1 C)      Physical Exam  Constitutional: He is oriented to person, place, and time. He appears well-developed and well-nourished.  HENT:  Head: Normocephalic and atraumatic.  Right Ear: There is drainage, swelling and tenderness (tender and red canal with swelling). No mastoid tenderness. Tympanic membrane is not injected. No hemotympanum.  Left Ear: Tympanic membrane, external ear and ear canal normal.  Nose: Nose normal.  Mouth/Throat: Oropharynx is clear and moist. No oropharyngeal exudate.  Eyes: Conjunctivae and EOM are normal. Pupils are equal, round, and reactive to light.  Neck: Normal range of motion. Neck supple.  Cardiovascular: Normal rate and regular rhythm.   Pulmonary/Chest: Effort normal and breath sounds normal.  Musculoskeletal: Normal range of motion.  Lymphadenopathy:    He has no cervical adenopathy.  Neurological: He is alert and oriented to person, place, and time. No sensory deficit. He exhibits normal muscle tone.  Skin: Skin is warm and dry. Capillary refill takes less than 2 seconds. No rash noted.  Psychiatric: He has a normal mood and affect. His behavior is normal.  Vitals reviewed.    ASSESSMENT & PLAN: Keith [name] was seen today for ear pain.  Diagnoses and all orders for this visit:  Other infective acute otitis externa of right ear  Acute otalgia, right  Other orders -     azithromycin (ZITHROMAX) 250 MG tablet; Sig as indicated -     neomycin-colistin-hydrocortisone-thonzonium (CORTISPORIN-TC) 3.11-15-08-0.5 MG/ML otic suspension; Place 4 drops into the right ear 4 (four) times daily. -     diclofenac (VOLTAREN) 75 MG EC tablet; Take 1 tablet (75 mg total) by mouth 2 (two) times daily.    Patient Instructions        IF you received an x-ray today, you will receive an invoice from [facility]. Please contact [facility] at [phone] with questions or concerns regarding your invoice.   IF you received labwork today, you will  receive an invoice from [facility]. Please contact [facility] at [phone] with questions or concerns regarding your invoice.   Our billing staff will not be able to assist you with questions regarding bills from these companies.  You will be contacted with the lab results as soon as they are available. The fastest way to get your results is to activate your My Chart account. Instructions are located on the last page of this paperwork. If you have not heard from [place] regarding the results in 2 weeks, please contact this office.      Otitis Externa Otitis externa is an infection of the outer ear canal. The outer ear canal is the area between the outside of the ear and the eardrum. Otitis externa is sometimes called "swimmer's ear." Follow these instructions at home:  If you were given antibiotic ear drops, use them as told by your doctor. Do not stop using them even if your condition gets better.  Take over-the-counter and prescription medicines only as told by your doctor.  Keep all follow-up visits as told by your doctor. This is important. How is this prevented?  Keep your ear dry. Use the corner of a towel to dry your ear after you swim or bathe.  Try not to scratch or put things in your ear. Doing these things makes it easier for germs to grow in your ear.  Avoid swimming in lakes, dirty water, or pools that may not have the right amount of a chemical called chlorine.  Consider making ear drops and putting 3 or 4 drops in each ear after you swim. Ask your doctor about how you can make ear drops. Contact a doctor if:  You have a fever.  After 3 days your ear is still red, swollen, or painful.  After 3 days you still have pus coming from your  ear.  Your redness, swelling, or pain gets worse.  You have a really bad headache.  You have redness, swelling, pain, or tenderness behind your ear. This information is not intended to replace advice given to you by your health care provider. Make sure you discuss any questions you have with your health care provider. Document Released: 02/18/2008 Document Revised: 09/27/2015 Document Reviewed: 06/11/2015 Elsevier Interactive Patient Education  2017 Elsevier Inc.      Agustina Caroli, MD [facility]

## 2017-01-23 NOTE — Telephone Encounter (Signed)
Cortisporin is rx, can you rx alt. Med for cost?

## 2017-01-24 ENCOUNTER — Other Ambulatory Visit: Payer: Self-pay | Admitting: Emergency Medicine

## 2017-01-24 MED ORDER — CIPROFLOXACIN-DEXAMETHASONE 0.3-0.1 % OT SUSP: 4.0000 [drp] | Freq: Two times a day (BID) | OTIC | 0 refills | Status: AC

## 2017-01-24 NOTE — Telephone Encounter (Signed)
Cortisporin shouldn't be expensive but we can switch to Ciprodex. Will order it.

## 2017-02-01 ENCOUNTER — Other Ambulatory Visit: Payer: Self-pay | Admitting: Internal Medicine

## 2017-04-15 ENCOUNTER — Other Ambulatory Visit: Payer: Self-pay | Admitting: Internal Medicine

## 2017-05-01 ENCOUNTER — Other Ambulatory Visit (INDEPENDENT_AMBULATORY_CARE_PROVIDER_SITE_OTHER): Payer: Medicare HMO

## 2017-05-01 DIAGNOSIS — Z0001 Encounter for general adult medical examination with abnormal findings: Secondary | ICD-10-CM | POA: Diagnosis not present

## 2017-05-01 DIAGNOSIS — E119 Type 2 diabetes mellitus without complications: Secondary | ICD-10-CM

## 2017-05-01 LAB — CBC WITH DIFFERENTIAL/PLATELET
BASOS PCT: 1.1 % (ref 0.0–3.0)
Basophils Absolute: 0.1 10*3/uL (ref 0.0–0.1)
EOS ABS: 0.2 10*3/uL (ref 0.0–0.7)
Eosinophils Relative: 3.5 % (ref 0.0–5.0)
HCT: 44.4 % (ref 39.0–52.0)
HEMOGLOBIN: 15.1 g/dL (ref 13.0–17.0)
Lymphocytes Relative: 27.7 % (ref 12.0–46.0)
Lymphs Abs: 2 10*3/uL (ref 0.7–4.0)
MCHC: 34.1 g/dL (ref 30.0–36.0)
MCV: 89.9 fl (ref 78.0–100.0)
MONO ABS: 0.8 10*3/uL (ref 0.1–1.0)
Monocytes Relative: 10.5 % (ref 3.0–12.0)
NEUTROS PCT: 57.2 % (ref 43.0–77.0)
Neutro Abs: 4.1 10*3/uL (ref 1.4–7.7)
PLATELETS: 185 10*3/uL (ref 150.0–400.0)
RBC: 4.93 Mil/uL (ref 4.22–5.81)
RDW: 13.8 % (ref 11.5–15.5)
WBC: 7.2 10*3/uL (ref 4.0–10.5)

## 2017-05-01 LAB — LIPID PANEL
CHOL/HDL RATIO: 6
Cholesterol: 152 mg/dL (ref 0–200)
HDL: 26 mg/dL — ABNORMAL LOW (ref >39.00)
LDL Cholesterol: 92 mg/dL (ref 0–99)
NonHDL: 125.58
Triglycerides: 168 mg/dL — ABNORMAL HIGH (ref 0.0–149.0)
VLDL: 33.6 mg/dL (ref 0.0–40.0)

## 2017-05-01 LAB — BASIC METABOLIC PANEL
BUN: 15 mg/dL (ref 6–23)
CHLORIDE: 102 meq/L (ref 96–112)
CO2: 27 mEq/L (ref 19–32)
Calcium: 9.1 mg/dL (ref 8.4–10.5)
Creatinine, Ser: 1.1 mg/dL (ref 0.40–1.50)
GFR: 72.55 mL/min (ref >60.00)
GLUCOSE: 141 mg/dL — AB (ref 70–99)
POTASSIUM: 4.1 meq/L (ref 3.5–5.1)
Sodium: 138 mEq/L (ref 135–145)

## 2017-05-01 LAB — URINALYSIS, ROUTINE W REFLEX MICROSCOPIC
Bilirubin Urine: NEGATIVE
Hgb urine dipstick: NEGATIVE
KETONES UR: NEGATIVE
Leukocytes, UA: NEGATIVE
NITRITE: NEGATIVE
PH: 6 (ref 5.0–8.0)
RBC / HPF: NONE SEEN (ref 0–?)
SPECIFIC GRAVITY, URINE: 1.015 (ref 1.000–1.030)
TOTAL PROTEIN, URINE-UPE24: NEGATIVE
URINE GLUCOSE: 250 — AB
Urobilinogen, UA: 1 (ref 0.0–1.0)
WBC, UA: NONE SEEN (ref 0–?)

## 2017-05-01 LAB — HEPATIC FUNCTION PANEL
ALT: 26 U/L (ref 0–53)
AST: 19 U/L (ref 0–37)
Albumin: 3.8 g/dL (ref 3.5–5.2)
Alkaline Phosphatase: 54 U/L (ref 39–117)
BILIRUBIN TOTAL: 0.8 mg/dL (ref 0.2–1.2)
Bilirubin, Direct: 0.1 mg/dL (ref 0.0–0.3)
Total Protein: 6.4 g/dL (ref 6.0–8.3)

## 2017-05-01 LAB — MICROALBUMIN / CREATININE URINE RATIO
CREATININE, U: 144.4 mg/dL
MICROALB/CREAT RATIO: 0.5 mg/g (ref 0.0–30.0)

## 2017-05-01 LAB — HEMOGLOBIN A1C: HEMOGLOBIN A1C: 7.6 % — AB (ref 4.6–6.5)

## 2017-05-01 LAB — TSH: TSH: 4.9 u[IU]/mL — AB (ref 0.35–4.50)

## 2017-05-01 LAB — PSA: PSA: 0.73 ng/mL (ref 0.10–4.00)

## 2017-05-06 ENCOUNTER — Ambulatory Visit (INDEPENDENT_AMBULATORY_CARE_PROVIDER_SITE_OTHER): Payer: Medicare HMO | Admitting: Internal Medicine

## 2017-05-06 ENCOUNTER — Encounter: Payer: Self-pay | Admitting: Internal Medicine

## 2017-05-06 VITALS — BP 138/92 | HR 65 | Temp 98.4°F | Ht 70.5 in | Wt 237.0 lb

## 2017-05-06 DIAGNOSIS — Z0001 Encounter for general adult medical examination with abnormal findings: Secondary | ICD-10-CM | POA: Diagnosis not present

## 2017-05-06 DIAGNOSIS — E785 Hyperlipidemia, unspecified: Secondary | ICD-10-CM

## 2017-05-06 DIAGNOSIS — Z114 Encounter for screening for human immunodeficiency virus [HIV]: Secondary | ICD-10-CM | POA: Diagnosis not present

## 2017-05-06 DIAGNOSIS — Z23 Encounter for immunization: Secondary | ICD-10-CM

## 2017-05-06 DIAGNOSIS — E119 Type 2 diabetes mellitus without complications: Secondary | ICD-10-CM | POA: Diagnosis not present

## 2017-05-06 DIAGNOSIS — H543 Unqualified visual loss, both eyes: Secondary | ICD-10-CM

## 2017-05-06 DIAGNOSIS — R51 Headache: Secondary | ICD-10-CM | POA: Diagnosis not present

## 2017-05-06 DIAGNOSIS — I10 Essential (primary) hypertension: Secondary | ICD-10-CM

## 2017-05-06 DIAGNOSIS — R519 Headache, unspecified: Secondary | ICD-10-CM

## 2017-05-06 MED ORDER — LINAGLIPTIN 5 MG PO TABS: 5.0000 mg | ORAL_TABLET | Freq: Every day | ORAL | 3 refills | Status: DC

## 2017-05-06 NOTE — Patient Instructions (Addendum)
You had the flu shot today'  Please take all new medication as prescribed  - the tradjenta 5 mg per day  Please continue all other medications as before, and refills have been done if requested.  Please have the pharmacy call with any other refills you may need.  Please continue your efforts at being more active, low cholesterol diet, and weight control.  You are otherwise up to date with prevention measures today.  Please keep your appointments with your specialists as you may have planned  You will be contacted regarding the referral for: MRI head (open at [facility] if possible)  Please return in 6 months, or sooner if needed, with Lab testing done 3-5 days before

## 2017-05-06 NOTE — Assessment & Plan Note (Signed)
stable overall by history and exam, recent data reviewed with pt, and pt to continue medical treatment as before,  to f/u any worsening symptoms or concerns  

## 2017-05-06 NOTE — Progress Notes (Signed)
Subjective:    Patient ID: Keith [name], male    DOB: 1955/11/27, 61 y.o.   MRN: [id number]  HPI Here for wellness and f/u;  Overall doing ok;  Pt denies Chest pain, worsening SOB, DOE, wheezing, orthopnea, PND, worsening LE edema, palpitations, dizziness or syncope.  Pt denies neurological change such as new facial or extremity weakness.  Pt denies polydipsia, polyuria, or low sugar symptoms. Pt states overall good compliance with treatment and medications, good tolerability, and has been trying to follow appropriate diet.  Pt denies worsening depressive symptoms, suicidal ideation or panic. No fever, night sweats, wt loss, loss of appetite, or other constitutional symptoms.  Pt states good ability with ADL's, has low fall risk, home safety reviewed and adequate, no other significant changes in hearing, and occasionally active with exercise.   CBG's in the lower 100's with better diet  Never took the zetia or invokana due to cost $400 . Wt Readings from Last 3 Encounters:  05/06/17 237 lb (107.5 kg)  01/21/17 233 lb 6.4 oz (105.9 kg)  11/04/16 239 lb (108.4 kg)   BP Readings from Last 3 Encounters:  05/06/17 (!) 138/92  01/21/17 132/88  11/04/16 132/80  BP's at home 130/80 at home.   Also here with c/o recurring HA with severe blurred visioin, dull, pressure, seemed to start after accidentally hard striking the head on a car he was working on 3 mo ago, since then with 3 episodes sudden bifrontal area head pain, blurred vision with near complete vision loss for 45 min each time , not assoc with photophobia or n/v.  No prior hx of migraine of other significant benign HA.  No fever.  Has ongoing neck pain in the past but no assoc vision loss.  Nothing else seems to make better or worse Past Medical History:  Diagnosis Date  . Anxiety   . Arthritis   . Cardiomyopathy    Echo (5/11) showed EF 30% with diffuse hypokinesis, normal wall thickness, mildly decreased RV systolic function. This may  be a cardiomyopathy due to hemochromatosis versus tachy-mediated. Unable to get cardiac MRI due to claustrophobia.  . Cervical spine pain    Chronic  . Chest pain    Exercise/adenosine Myoview showed EF 50% (3/10). Inferior hypokinesis. Perfusion images, inferior scar and ischemia in the apical anterior septum and in teh inferior wall . Suggestive of a CD. LHC (3/10) showed only lu minal irregulatrities in the coronaries w/ EF 55%, suggesting that myoview was a false positive, likely from diaphragmatic attnuation.  . Diabetes mellitus   . Diverticulosis   . Fatty liver   . GERD (gastroesophageal reflux disease)   . Heart murmur   . Hereditary hemochromatosis    Compound heterozygote. Pt gets periodic phlebotomies  . History of colonic polyps   . Hyperlipidemia   . Hypertension   . Low back pain    The pt is S/P spinal fusion surgeries  . Multifocal atrial tachycardia (HCC)    Wandering atrial pacemaker. Holter monior in 5/11 showed frequent runs of symtomatic MAT with heart rate around 100. Pt is now  on amiodarone to try to suppress MAT. PFTs (6/11): FVC 105%, FEV1 109%, TLC 112%, DLCO 87% (minimal obstructive effect). LFTs stable on amiodarone  . Peptic ulcer disease   . Thyroid nodule    Past Surgical History:  Procedure Laterality Date  . BACK SURGERY  1991   Lumbar  . INGUINAL HERNIA REPAIR    . SPINE SURGERY    .  TONSILLECTOMY      reports that he quit smoking about 13 years ago. He has never used smokeless tobacco. He reports that he drinks about 0.5 oz of alcohol per week . He reports that he uses drugs. family history includes Alcohol abuse in his father; Colitis in his mother; Colon polyps in his mother; Diabetes in his mother, paternal grandfather, and paternal grandmother; Hypertension in his father; Multiple sclerosis in his mother and sister. Allergies  Allergen Reactions  . Lipitor [Atorvastatin]     myalgias  . Metoclopramide Hcl   . Penicillins   . Sertraline  Hcl    Current Outpatient Prescriptions on File Prior to Visit  Medication Sig Dispense Refill  . aspirin 81 MG EC tablet Take 81 mg by mouth daily.      . Cholecalciferol (VITAMIN D-3 PO) Take 1 tablet by mouth daily. PATIENT TAKES ONCE TABLET BY MOUTH ONCE DAILY . PATIENT NOT SURE OF DOSEAGE.    Marland Kitchen diazepam (VALIUM) 5 MG tablet Take 1 tablet (5 mg total) by mouth daily as needed for anxiety. 30 tablet 1  . glipiZIDE (GLUCOTROL XL) 10 MG 24 hr tablet TAKE 1 TABLET DAILY WITH BREAKFAST 90 tablet 3  . glucosamine-chondroitin 500-400 MG tablet Take 1 tablet by mouth 3 (three) times daily.    Marland Kitchen ibuprofen (ADVIL,MOTRIN) 400 MG tablet Take 400 mg by mouth every 6 (six) hours as needed for headache or moderate pain. Reported on 10/30/2015    . levothyroxine (SYNTHROID, LEVOTHROID) 25 MCG tablet TAKE 1 TABLET EVERY DAY 90 tablet 2  . losartan (COZAAR) 50 MG tablet TAKE 1 TABLET EVERY DAY 90 tablet 1  . metFORMIN (GLUCOPHAGE-XR) 500 MG 24 hr tablet TAKE 4 TABLETS EVERY DAY WITH BREAKFAST 360 tablet 2  . metoprolol succinate (TOPROL-XL) 100 MG 24 hr tablet TAKE 1 TABLET TWICE DAILY 180 tablet 1  . Omega-3 Fatty Acids (FISH OIL PO) Take 1 tablet by mouth daily.     . pantoprazole (PROTONIX) 40 MG tablet TAKE 1 TABLET EVERY DAY 90 tablet 1  . triamcinolone (NASACORT AQ) 55 MCG/ACT AERO nasal inhaler Place 2 sprays into the nose daily. 1 Inhaler 12   No current facility-administered medications on file prior to visit.    Review of Systems Constitutional: Negative for other unusual diaphoresis, sweats, appetite or weight changes HENT: Negative for other worsening hearing loss, ear pain, facial swelling, mouth sores or neck stiffness.   Eyes: Negative for other worsening pain, redness or other visual disturbance.  Respiratory: Negative for other stridor or swelling Cardiovascular: Negative for other palpitations or other chest pain  Gastrointestinal: Negative for worsening diarrhea or loose stools, blood  in stool, distention or other pain Genitourinary: Negative for hematuria, flank pain or other change in urine volume.  Musculoskeletal: Negative for myalgias or other joint swelling.  Skin: Negative for other color change, or other wound or worsening drainage.  Neurological: Negative for other syncope or numbness. Hematological: Negative for other adenopathy or swelling Psychiatric/Behavioral: Negative for hallucinations, other worsening agitation, SI, self-injury, or new decreased concentration All other system neg per pt    Objective:   Physical Exam BP (!) 138/92   Pulse 65   Temp 98.4 F (36.9 C)   Ht 5' 10.5" (1.791 m)   Wt 237 lb (107.5 kg)   SpO2 98%   BMI 33.53 kg/m  VS noted,  Constitutional: Pt is oriented to person, place, and time. Appears well-developed and well-nourished, in no significant distress and comfortable Head:  Normocephalic and atraumatic  Eyes: Conjunctivae and EOM are normal. Pupils are equal, round, and reactive to light Right Ear: External ear normal without discharge Left Ear: External ear normal without discharge Nose: Nose without discharge or deformity Mouth/Throat: Oropharynx is without other ulcerations and moist  Neck: Normal range of motion. Neck supple. No JVD present. No tracheal deviation present or significant neck LA or mass Cardiovascular: Normal rate, regular rhythm, normal heart sounds and intact distal pulses.   Pulmonary/Chest: WOB normal and breath sounds without rales or wheezing  Abdominal: Soft. Bowel sounds are normal. NT. No HSM  Musculoskeletal: Normal range of motion. Exhibits no edema Lymphadenopathy: Has no other cervical adenopathy.  Neurological: Pt is alert and oriented to person, place, and time. Pt has normal reflexes. No cranial nerve deficit. Motor grossly intact, Gait intact, cn 2-12 intact Skin: Skin is warm and dry. No rash noted or new ulcerations Psychiatric:  Has normal mood and affect. Behavior is normal without  agitation No other exam findings       Assessment & Plan:

## 2017-05-06 NOTE — Assessment & Plan Note (Addendum)
With Headache recurring episodes bilat x 3 mo after head strike;  Will need MRI head r/o SDH or pituitary disorder or other eye related disease  In addition to the time spent performing CPE, I spent an additional 25 minutes face to face,in which greater than 50% of this time was spent in counseling and coordination of care for patient's acute illness as documented., including the differential dx, tx, evaluation and further management of Vision loss with HA, DM, HTN, HLD

## 2017-05-06 NOTE — Assessment & Plan Note (Addendum)
Lab Results  Component Value Date   LDLCALC 92 05/01/2017  goal ldl < 70; statin intolerant and zetia too expensive,m to cont low chol diet

## 2017-05-06 NOTE — Assessment & Plan Note (Signed)
With improved but still persistently mildly high a1c to add tradjenta 5 qd

## 2017-05-06 NOTE — Addendum Note (Signed)
Addended by: Juliet Rude on: 05/06/2017 09:21 AM   Modules accepted: Orders

## 2017-05-08 ENCOUNTER — Other Ambulatory Visit: Payer: Self-pay | Admitting: Internal Medicine

## 2017-05-08 DIAGNOSIS — Z77018 Contact with and (suspected) exposure to other hazardous metals: Secondary | ICD-10-CM

## 2017-05-08 DIAGNOSIS — R51 Headache: Principal | ICD-10-CM

## 2017-05-08 DIAGNOSIS — R519 Headache, unspecified: Secondary | ICD-10-CM

## 2017-05-25 ENCOUNTER — Ambulatory Visit
Admission: RE | Admit: 2017-05-25 | Discharge: 2017-05-25 | Disposition: A | Discharge status: Home / Self Care | Payer: Medicare HMO | Source: Ambulatory Visit | Attending: Internal Medicine | Admitting: Internal Medicine

## 2017-05-25 ENCOUNTER — Other Ambulatory Visit: Payer: Medicare HMO

## 2017-05-25 ENCOUNTER — Other Ambulatory Visit: Payer: Self-pay | Admitting: Internal Medicine

## 2017-05-25 ENCOUNTER — Encounter: Payer: Self-pay | Admitting: Internal Medicine

## 2017-05-25 DIAGNOSIS — S0990XA Unspecified injury of head, initial encounter: Secondary | ICD-10-CM | POA: Diagnosis not present

## 2017-05-25 DIAGNOSIS — R519 Headache, unspecified: Secondary | ICD-10-CM

## 2017-05-25 DIAGNOSIS — Z77018 Contact with and (suspected) exposure to other hazardous metals: Secondary | ICD-10-CM

## 2017-05-25 DIAGNOSIS — Z01818 Encounter for other preprocedural examination: Secondary | ICD-10-CM | POA: Diagnosis not present

## 2017-05-25 DIAGNOSIS — R51 Headache: Principal | ICD-10-CM

## 2017-06-04 ENCOUNTER — Ambulatory Visit (INDEPENDENT_AMBULATORY_CARE_PROVIDER_SITE_OTHER): Payer: Medicare HMO | Admitting: Physician Assistant

## 2017-06-04 ENCOUNTER — Encounter: Payer: Self-pay | Admitting: Physician Assistant

## 2017-06-04 VITALS — BP 140/90 | HR 75 | Temp 97.2°F | Resp 16 | Ht 70.5 in | Wt 235.0 lb

## 2017-06-04 DIAGNOSIS — T1591XA Foreign body on external eye, part unspecified, right eye, initial encounter: Secondary | ICD-10-CM

## 2017-06-04 DIAGNOSIS — T1501XA Foreign body in cornea, right eye, initial encounter: Secondary | ICD-10-CM | POA: Diagnosis not present

## 2017-06-04 NOTE — Patient Instructions (Addendum)
[facility], 3 pm Address: [address], [place], [place] [address]                        Phone: [phone]  IF you received an x-ray today, you will receive an invoice from [facility]. Please contact [facility] at [phone] with questions or concerns regarding your invoice.   IF you received labwork today, you will receive an invoice from [facility]. Please contact [facility] at [phone] with questions or concerns regarding your invoice.   Our billing staff will not be able to assist you with questions regarding bills from these companies.  You will be contacted with the lab results as soon as they are available. The fastest way to get your results is to activate your My Chart account. Instructions are located on the last page of this paperwork. If you have not heard from [place] regarding the results in 2 weeks, please contact this office.

## 2017-06-04 NOTE — Progress Notes (Signed)
06/04/2017 10:07 AM   DOB: Dec 30, 1955 / MRN: [id number]  SUBJECTIVE:  Keith [name] is a 61 y.o. male presenting for a piece of metal in his eye.  Denies changes in vision and photophobia.  This started about 3 days ago after using a blower on metal shavings.  He was wearing spectacles but not safety glasses.    He is allergic to lipitor [atorvastatin]; metoclopramide hcl; penicillins; and sertraline hcl.   He  has a past medical history of Anxiety; Arthritis; Cardiomyopathy; Cervical spine pain; Chest pain; Diabetes mellitus; Diverticulosis; Fatty liver; GERD (gastroesophageal reflux disease); Heart murmur; Hereditary hemochromatosis; History of colonic polyps; Hyperlipidemia; Hypertension; Low back pain; Multifocal atrial tachycardia ([place]); Peptic ulcer disease; and Thyroid nodule.    He  reports that he quit smoking about 13 years ago. He has never used smokeless tobacco. He reports that he drinks about 0.5 oz of alcohol per week . He reports that he uses drugs. He  has no sexual activity history on file. The patient  has a past surgical history that includes Inguinal hernia repair; Back surgery (1991); Tonsillectomy; and Spine surgery.  His family history includes Alcohol abuse in his father; Colitis in his mother; Colon polyps in his mother; Diabetes in his mother, paternal grandfather, and paternal grandmother; Hypertension in his father; Multiple sclerosis in his mother and sister.  Review of Systems  Constitutional: Negative for chills, diaphoresis and fever.  Eyes: Negative.   Respiratory: Negative for shortness of breath.   Cardiovascular: Negative for chest pain, orthopnea and leg swelling.  Gastrointestinal: Negative for nausea.  Skin: Negative for rash.  Neurological: Negative for dizziness, sensory change, speech change, focal weakness and headaches.    The problem list and medications were reviewed and updated by myself where necessary and exist elsewhere in the  encounter.   OBJECTIVE:  BP 140/90 (BP Location: Right Arm, Patient Position: Sitting, Cuff Size: Large)   Pulse 75   Temp (!) 97.2 F (36.2 C) (Oral)   Resp 16   Ht 5' 10.5" (1.791 m)   Wt 235 lb (106.6 kg)   SpO2 96%   BMI 33.24 kg/m   Physical Exam  Constitutional: He appears well-developed. He is active and cooperative.  Non-toxic appearance.  HENT:  Right Ear: Hearing, tympanic membrane, external ear and ear canal normal.  Left Ear: Hearing, tympanic membrane, external ear and ear canal normal.  Nose: Nose normal. Right sinus exhibits no maxillary sinus tenderness and no frontal sinus tenderness. Left sinus exhibits no maxillary sinus tenderness and no frontal sinus tenderness.  Mouth/Throat: Uvula is midline, oropharynx is clear and moist and mucous membranes are normal. No oropharyngeal exudate, posterior oropharyngeal edema or tonsillar abscesses.  Eyes: Pupils are equal, round, and reactive to light. Right conjunctiva is injected. Right conjunctiva has no hemorrhage. Left conjunctiva is not injected. Left conjunctiva has no hemorrhage. Right eye exhibits normal extraocular motion and no nystagmus. Left eye exhibits normal extraocular motion and no nystagmus. Right pupil is round and reactive. Left pupil is round and reactive. Pupils are equal.  Slit lamp exam:      The right eye shows no corneal flare and no fluorescein uptake.    Cardiovascular: Normal rate.   Pulmonary/Chest: Effort normal. No tachypnea.  Musculoskeletal: Normal range of motion.  Lymphadenopathy:       Head (right side): No submandibular and no tonsillar adenopathy present.       Head (left side): No submandibular and no tonsillar adenopathy present.  He has no cervical adenopathy.  Neurological: He is alert.  Skin: Skin is warm and dry. He is not diaphoretic. No pallor.  Vitals reviewed.   No results found for this or any previous visit (from the past 72 hour(s)).  No results  found.  ASSESSMENT AND PLAN:  Dracen was seen today for eye injury.  Diagnoses and all orders for this visit:  Foreign body of right eye, initial encounter Comments: Sent to [facility] for appointment today given the possibility of a rust ring there is little that I can do but refer today.     The patient is advised to call or return to clinic if he does not see an improvement in symptoms, or to seek the care of the closest emergency department if he worsens with the above plan.   Philis Fendt, MHS, PA-C [facility] 06/04/2017 10:07 AM

## 2017-07-15 ENCOUNTER — Other Ambulatory Visit (INDEPENDENT_AMBULATORY_CARE_PROVIDER_SITE_OTHER): Payer: Medicare HMO

## 2017-07-15 DIAGNOSIS — Z114 Encounter for screening for human immunodeficiency virus [HIV]: Secondary | ICD-10-CM

## 2017-07-15 LAB — CBC WITH DIFFERENTIAL/PLATELET
BASOS ABS: 0.1 10*3/uL (ref 0.0–0.1)
Basophils Relative: 1.3 % (ref 0.0–3.0)
Eosinophils Absolute: 0.2 10*3/uL (ref 0.0–0.7)
Eosinophils Relative: 3.1 % (ref 0.0–5.0)
HEMATOCRIT: 46.2 % (ref 39.0–52.0)
HEMOGLOBIN: 15.8 g/dL (ref 13.0–17.0)
LYMPHS PCT: 26.4 % (ref 12.0–46.0)
Lymphs Abs: 2.1 10*3/uL (ref 0.7–4.0)
MCHC: 34.2 g/dL (ref 30.0–36.0)
MCV: 90.2 fl (ref 78.0–100.0)
MONOS PCT: 10.6 % (ref 3.0–12.0)
Monocytes Absolute: 0.8 10*3/uL (ref 0.1–1.0)
Neutro Abs: 4.6 10*3/uL (ref 1.4–7.7)
Neutrophils Relative %: 58.6 % (ref 43.0–77.0)
Platelets: 197 10*3/uL (ref 150.0–400.0)
RBC: 5.12 Mil/uL (ref 4.22–5.81)
RDW: 14 % (ref 11.5–15.5)
WBC: 7.9 10*3/uL (ref 4.0–10.5)

## 2017-07-15 LAB — COMPREHENSIVE METABOLIC PANEL
ALK PHOS: 63 U/L (ref 39–117)
ALT: 49 U/L (ref 0–53)
AST: 36 U/L (ref 0–37)
Albumin: 4.1 g/dL (ref 3.5–5.2)
BILIRUBIN TOTAL: 0.8 mg/dL (ref 0.2–1.2)
BUN: 12 mg/dL (ref 6–23)
CALCIUM: 9.4 mg/dL (ref 8.4–10.5)
CO2: 28 meq/L (ref 19–32)
Chloride: 100 mEq/L (ref 96–112)
Creatinine, Ser: 1.11 mg/dL (ref 0.40–1.50)
GFR: 71.75 mL/min (ref >60.00)
Glucose, Bld: 180 mg/dL — ABNORMAL HIGH (ref 70–99)
Potassium: 4.3 mEq/L (ref 3.5–5.1)
Sodium: 136 mEq/L (ref 135–145)
TOTAL PROTEIN: 7.5 g/dL (ref 6.0–8.3)

## 2017-07-15 LAB — FERRITIN: Ferritin: 77 ng/mL (ref 22.0–322.0)

## 2017-07-15 LAB — IBC PANEL
IRON: 81 ug/dL (ref 42–165)
Saturation Ratios: 25.7 % (ref 20.0–50.0)
Transferrin: 225 mg/dL (ref 212.0–360.0)

## 2017-07-16 ENCOUNTER — Encounter: Payer: Self-pay | Admitting: Internal Medicine

## 2017-07-16 ENCOUNTER — Other Ambulatory Visit: Payer: Self-pay

## 2017-07-16 LAB — HIV ANTIBODY (ROUTINE TESTING W REFLEX): HIV 1&2 Ab, 4th Generation: NONREACTIVE

## 2017-08-28 ENCOUNTER — Other Ambulatory Visit: Payer: Self-pay | Admitting: Internal Medicine

## 2017-11-09 ENCOUNTER — Other Ambulatory Visit (INDEPENDENT_AMBULATORY_CARE_PROVIDER_SITE_OTHER): Payer: Medicare HMO

## 2017-11-09 DIAGNOSIS — E119 Type 2 diabetes mellitus without complications: Secondary | ICD-10-CM | POA: Diagnosis not present

## 2017-11-09 LAB — LIPID PANEL
CHOLESTEROL: 186 mg/dL (ref 0–200)
HDL: 27.8 mg/dL — ABNORMAL LOW (ref >39.00)
NonHDL: 158.41
TRIGLYCERIDES: 239 mg/dL — AB (ref 0.0–149.0)
Total CHOL/HDL Ratio: 7
VLDL: 47.8 mg/dL — ABNORMAL HIGH (ref 0.0–40.0)

## 2017-11-09 LAB — HEPATIC FUNCTION PANEL
ALBUMIN: 4 g/dL (ref 3.5–5.2)
ALT: 49 U/L (ref 0–53)
AST: 36 U/L (ref 0–37)
Alkaline Phosphatase: 64 U/L (ref 39–117)
Bilirubin, Direct: 0.1 mg/dL (ref 0.0–0.3)
TOTAL PROTEIN: 7.3 g/dL (ref 6.0–8.3)
Total Bilirubin: 0.7 mg/dL (ref 0.2–1.2)

## 2017-11-09 LAB — BASIC METABOLIC PANEL
BUN: 14 mg/dL (ref 6–23)
CALCIUM: 9.8 mg/dL (ref 8.4–10.5)
CO2: 28 mEq/L (ref 19–32)
CREATININE: 1.1 mg/dL (ref 0.40–1.50)
Chloride: 100 mEq/L (ref 96–112)
GFR: 72.42 mL/min (ref >60.00)
Glucose, Bld: 151 mg/dL — ABNORMAL HIGH (ref 70–99)
Potassium: 4.3 mEq/L (ref 3.5–5.1)
SODIUM: 137 meq/L (ref 135–145)

## 2017-11-09 LAB — HEMOGLOBIN A1C: Hgb A1c MFr Bld: 8.7 % — ABNORMAL HIGH (ref 4.6–6.5)

## 2017-11-09 LAB — LDL CHOLESTEROL, DIRECT: Direct LDL: 114 mg/dL

## 2017-11-11 ENCOUNTER — Encounter: Payer: Self-pay | Admitting: Internal Medicine

## 2017-11-11 ENCOUNTER — Ambulatory Visit (INDEPENDENT_AMBULATORY_CARE_PROVIDER_SITE_OTHER): Payer: Medicare HMO | Admitting: Internal Medicine

## 2017-11-11 ENCOUNTER — Ambulatory Visit (INDEPENDENT_AMBULATORY_CARE_PROVIDER_SITE_OTHER): Payer: Medicare HMO | Admitting: *Deleted

## 2017-11-11 ENCOUNTER — Ambulatory Visit (INDEPENDENT_AMBULATORY_CARE_PROVIDER_SITE_OTHER)
Admission: RE | Admit: 2017-11-11 | Discharge: 2017-11-11 | Disposition: A | Discharge status: Home / Self Care | Payer: Medicare HMO | Source: Ambulatory Visit | Attending: Internal Medicine | Admitting: Internal Medicine

## 2017-11-11 VITALS — BP 124/86 | HR 71 | Temp 97.9°F | Ht 70.5 in | Wt 238.0 lb

## 2017-11-11 VITALS — BP 124/86 | HR 71 | Resp 18 | Ht 71.0 in | Wt 238.0 lb

## 2017-11-11 DIAGNOSIS — Z0000 Encounter for general adult medical examination without abnormal findings: Secondary | ICD-10-CM | POA: Diagnosis not present

## 2017-11-11 DIAGNOSIS — E291 Testicular hypofunction: Secondary | ICD-10-CM

## 2017-11-11 DIAGNOSIS — E119 Type 2 diabetes mellitus without complications: Secondary | ICD-10-CM

## 2017-11-11 DIAGNOSIS — R5383 Other fatigue: Secondary | ICD-10-CM | POA: Diagnosis not present

## 2017-11-11 DIAGNOSIS — Z77090 Contact with and (suspected) exposure to asbestos: Secondary | ICD-10-CM | POA: Diagnosis not present

## 2017-11-11 DIAGNOSIS — I10 Essential (primary) hypertension: Secondary | ICD-10-CM

## 2017-11-11 DIAGNOSIS — E785 Hyperlipidemia, unspecified: Secondary | ICD-10-CM | POA: Diagnosis not present

## 2017-11-11 MED ORDER — DULAGLUTIDE 1.5 MG/0.5ML ~~LOC~~ SOAJ: 0.5000 mL | SUBCUTANEOUS | 3 refills | Status: DC

## 2017-11-11 NOTE — Progress Notes (Signed)
Subjective:    Patient ID: Keith [name], male    DOB: Jan 05, 1956, 62 y.o.   MRN: [id number]  HPI  Here to f/u; overall doing ok,  Pt denies chest pain, increasing sob or doe, wheezing, orthopnea, PND, increased LE swelling, palpitations, dizziness or syncope.  Pt denies new neurological symptoms such as new headache, or facial or extremity weakness or numbness.  Pt denies polydipsia, polyuria, or low sugar episode.  Pt states overall good compliance with meds, mostly trying to follow appropriate diet, with wt overall stable,  but little exercise however. Wt Readings from Last 3 Encounters:  11/11/17 238 lb (108 kg)  06/04/17 235 lb (106.6 kg)  05/06/17 237 lb (107.5 kg)   BP Readings from Last 3 Encounters:  11/11/17 124/86  06/04/17 140/90  05/06/17 (!) 138/92  Zetia and tradjenta was too expensive, not interested in Anna for goal ldl < 70.  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  Does c/o ongoing fatigue, but denies signficant daytime hypersomnolence, asks for testosterone level.  Denies worsening depressive symptoms, suicidal ideation, or panic; has ongoing anxiety, asks for med refill.  No other interval hx or new complaints Past Medical History:  Diagnosis Date  . Anxiety   . Arthritis   . Cardiomyopathy    Echo (5/11) showed EF 30% with diffuse hypokinesis, normal wall thickness, mildly decreased RV systolic function. This may be a cardiomyopathy due to hemochromatosis versus tachy-mediated. Unable to get cardiac MRI due to claustrophobia.  . Cervical spine pain    Chronic  . Chest pain    Exercise/adenosine Myoview showed EF 50% (3/10). Inferior hypokinesis. Perfusion images, inferior scar and ischemia in the apical anterior septum and in teh inferior wall . Suggestive of a CD. LHC (3/10) showed only lu minal irregulatrities in the coronaries w/ EF 55%, suggesting that myoview was a false positive,  likely from diaphragmatic attnuation.  . Diabetes mellitus   . Diverticulosis   . Fatty liver   . GERD (gastroesophageal reflux disease)   . Heart murmur   . Hereditary hemochromatosis    Compound heterozygote. Pt gets periodic phlebotomies  . History of colonic polyps   . Hyperlipidemia   . Hypertension   . Low back pain    The pt is S/P spinal fusion surgeries  . Multifocal atrial tachycardia ([facility])    Wandering atrial pacemaker. Holter monior in 5/11 showed frequent runs of symtomatic MAT with heart rate around 100. Pt is now  on amiodarone to try to suppress MAT. PFTs (6/11): FVC 105%, FEV1 109%, TLC 112%, DLCO 87% (minimal obstructive effect). LFTs stable on amiodarone  . Peptic ulcer disease   . Thyroid nodule    Past Surgical History:  Procedure Laterality Date  . BACK SURGERY  1991   Lumbar  . INGUINAL HERNIA REPAIR    . SPINE SURGERY    . TONSILLECTOMY      reports that he quit smoking about 14 years ago. he has never used smokeless tobacco. He reports that he drinks about 0.5 oz of alcohol per week. He reports that he uses drugs. family history includes Alcohol abuse in his father; Colitis in his mother; Colon polyps in his mother; Diabetes in his mother, paternal grandfather, and paternal grandmother; Hypertension in his father; Multiple sclerosis in his mother and sister. Allergies  Allergen Reactions  . Lipitor [Atorvastatin]     myalgias  .  Metoclopramide Hcl   . Penicillins   . Sertraline Hcl    Current Outpatient Medications on File Prior to Visit  Medication Sig Dispense Refill  . aspirin 81 MG EC tablet Take 81 mg by mouth daily.      . Cholecalciferol (VITAMIN D-3 PO) Take 1 tablet by mouth daily. PATIENT TAKES ONCE TABLET BY MOUTH ONCE DAILY . PATIENT NOT SURE OF DOSEAGE.    Marland Kitchen diazepam (VALIUM) 5 MG tablet Take 1 tablet (5 mg total) by mouth daily as needed for anxiety. 30 tablet 1  . glipiZIDE (GLUCOTROL XL) 10 MG 24 hr tablet TAKE 1 TABLET DAILY WITH  BREAKFAST 90 tablet 2  . glucosamine-chondroitin 500-400 MG tablet Take 1 tablet by mouth 3 (three) times daily.    Marland Kitchen ibuprofen (ADVIL,MOTRIN) 400 MG tablet Take 400 mg by mouth every 6 (six) hours as needed for headache or moderate pain. Reported on 10/30/2015    . levothyroxine (SYNTHROID, LEVOTHROID) 25 MCG tablet TAKE 1 TABLET EVERY DAY 90 tablet 2  . losartan (COZAAR) 50 MG tablet TAKE 1 TABLET EVERY DAY 90 tablet 1  . metFORMIN (GLUCOPHAGE-XR) 500 MG 24 hr tablet TAKE 4 TABLETS EVERY DAY WITH BREAKFAST 360 tablet 2  . metoprolol succinate (TOPROL-XL) 100 MG 24 hr tablet TAKE 1 TABLET TWICE DAILY 180 tablet 2  . Omega-3 Fatty Acids (FISH OIL PO) Take 1 tablet by mouth daily.     . pantoprazole (PROTONIX) 40 MG tablet TAKE 1 TABLET EVERY DAY 90 tablet 2  . triamcinolone (NASACORT AQ) 55 MCG/ACT AERO nasal inhaler Place 2 sprays into the nose daily. 1 Inhaler 12   No current facility-administered medications on file prior to visit.    Review of Systems  Constitutional: Negative for other unusual diaphoresis or sweats HENT: Negative for ear discharge or swelling Eyes: Negative for other worsening visual disturbances Respiratory: Negative for stridor or other swelling  Gastrointestinal: Negative for worsening distension or other blood Genitourinary: Negative for retention or other urinary change Musculoskeletal: Negative for other MSK pain or swelling Skin: Negative for color change or other new lesions Neurological: Negative for worsening tremors and other numbness  Psychiatric/Behavioral: Negative for worsening agitation or other fatigue All other system neg per pt    Objective:   Physical Exam BP 124/86   Pulse 71   Temp 97.9 F (36.6 C) (Oral)   Ht 5' 10.5" (1.791 m)   Wt 238 lb (108 kg)   SpO2 96%   BMI 33.67 kg/m  VS noted,  Constitutional: Pt appears in NAD HENT: Head: NCAT.  Right Ear: External ear normal.  Left Ear: External ear normal.  Eyes: . Pupils are equal,  round, and reactive to light. Conjunctivae and EOM are normal Nose: without d/c or deformity Neck: Neck supple. Gross normal ROM Cardiovascular: Normal rate and regular rhythm.   Pulmonary/Chest: Effort normal and breath sounds without rales or wheezing.  Abd:  Soft, NT, ND, + BS, no organomegaly Neurological: Pt is alert. At baseline orientation, motor grossly intact Skin: Skin is warm. No rashes, other new lesions, no LE edema Psychiatric: Pt behavior is normal without agitation ,. 1+ nervous No other exam findings    Assessment & Plan:

## 2017-11-11 NOTE — Progress Notes (Addendum)
Subjective:   Keith [name] is a 62 y.o. male who presents for an Initial Medicare Annual Wellness Visit.  Review of Systems  No ROS.  Medicare Wellness Visit. Additional risk factors are reflected in the social history.  Cardiac Risk Factors include: advanced age (>2men, >22 women);diabetes mellitus;dyslipidemia;hypertension;male gender;obesity (BMI >30kg/m2) Sleep patterns: has interrupted sleep, gets up 1-2 times nightly to void and sleeps 5-6 hours nightly.  Patient reports insomnia issues, discussed recommended sleep tips.  Home Safety/Smoke Alarms: Feels safe in home. Smoke alarms in place.  Living environment; residence and Firearm Safety: 1-story house/ trailer, no firearms. Lives with wife, no needs for DME, good support system Seat Belt Safety/Bike Helmet: Wears seat belt.   PSA-  Lab Results  Component Value Date   PSA 0.73 05/01/2017   PSA 0.93 04/25/2016   PSA 0.89 10/29/2015      Objective:    Today's Vitals   11/11/17 0859  BP: 124/86  Pulse: 71  Resp: 18  SpO2: 96%  Weight: 238 lb (108 kg)  Height: 5\' 11"  (1.803 m)   Body mass index is 33.19 kg/m.  Advanced Directives 11/11/2017  Does Patient Have a Medical Advance Directive? No  Would patient like information on creating a medical advance directive? No - Patient declined    Current Medications (verified) Outpatient Encounter Medications as of 11/11/2017  Medication Sig  . aspirin 81 MG EC tablet Take 81 mg by mouth daily.    . Cholecalciferol (VITAMIN D-3 PO) Take 1 tablet by mouth daily. PATIENT TAKES ONCE TABLET BY MOUTH ONCE DAILY . PATIENT NOT SURE OF DOSEAGE.  Marland Kitchen diazepam (VALIUM) 5 MG tablet Take 1 tablet (5 mg total) by mouth daily as needed for anxiety.  . Dulaglutide (TRULICITY) 1.5 GY/6.5LD SOPN Inject 0.5 mLs into the skin once a week.  Marland Kitchen glipiZIDE (GLUCOTROL XL) 10 MG 24 hr tablet TAKE 1 TABLET DAILY WITH BREAKFAST  . glucosamine-chondroitin 500-400 MG tablet Take 1 tablet by mouth  3 (three) times daily.  Marland Kitchen ibuprofen (ADVIL,MOTRIN) 400 MG tablet Take 400 mg by mouth every 6 (six) hours as needed for headache or moderate pain. Reported on 10/30/2015  . levothyroxine (SYNTHROID, LEVOTHROID) 25 MCG tablet TAKE 1 TABLET EVERY DAY  . losartan (COZAAR) 50 MG tablet TAKE 1 TABLET EVERY DAY  . metFORMIN (GLUCOPHAGE-XR) 500 MG 24 hr tablet TAKE 4 TABLETS EVERY DAY WITH BREAKFAST  . metoprolol succinate (TOPROL-XL) 100 MG 24 hr tablet TAKE 1 TABLET TWICE DAILY  . Omega-3 Fatty Acids (FISH OIL PO) Take 1 tablet by mouth daily.   . pantoprazole (PROTONIX) 40 MG tablet TAKE 1 TABLET EVERY DAY  . triamcinolone (NASACORT AQ) 55 MCG/ACT AERO nasal inhaler Place 2 sprays into the nose daily.  . [DISCONTINUED] linagliptin (TRADJENTA) 5 MG TABS tablet Take 1 tablet (5 mg total) by mouth daily.   No facility-administered encounter medications on file as of 11/11/2017.     Allergies (verified) Lipitor [atorvastatin]; Metoclopramide hcl; Penicillins; and Sertraline hcl   History: Past Medical History:  Diagnosis Date  . Anxiety   . Arthritis   . Cardiomyopathy    Echo (5/11) showed EF 30% with diffuse hypokinesis, normal wall thickness, mildly decreased RV systolic function. This may be a cardiomyopathy due to hemochromatosis versus tachy-mediated. Unable to get cardiac MRI due to claustrophobia.  . Cervical spine pain    Chronic  . Chest pain    Exercise/adenosine Myoview showed EF 50% (3/10). Inferior hypokinesis. Perfusion images, inferior scar  and ischemia in the apical anterior septum and in teh inferior wall . Suggestive of a CD. LHC (3/10) showed only lu minal irregulatrities in the coronaries w/ EF 55%, suggesting that myoview was a false positive, likely from diaphragmatic attnuation.  . Diabetes mellitus   . Diverticulosis   . Fatty liver   . GERD (gastroesophageal reflux disease)   . Heart murmur   . Hereditary hemochromatosis    Compound heterozygote. Pt gets periodic  phlebotomies  . History of colonic polyps   . Hyperlipidemia   . Hypertension   . Low back pain    The pt is S/P spinal fusion surgeries  . Multifocal atrial tachycardia ([facility])    Wandering atrial pacemaker. Holter monior in 5/11 showed frequent runs of symtomatic MAT with heart rate around 100. Pt is now  on amiodarone to try to suppress MAT. PFTs (6/11): FVC 105%, FEV1 109%, TLC 112%, DLCO 87% (minimal obstructive effect). LFTs stable on amiodarone  . Peptic ulcer disease   . Thyroid nodule    Past Surgical History:  Procedure Laterality Date  . BACK SURGERY  1991   Lumbar  . INGUINAL HERNIA REPAIR    . SPINE SURGERY    . TONSILLECTOMY     Family History  Problem Relation Age of Onset  . Colon polyps Mother   . Multiple sclerosis Mother   . Diabetes Mother   . Colitis Mother   . Hypertension Father   . Alcohol abuse Father   . Multiple sclerosis Sister   . Diabetes Paternal Grandmother   . Diabetes Paternal Grandfather   . Colon cancer Neg Hx    Social History   Socioeconomic History  . Marital status: Married    Spouse name: None  . Number of children: 3  . Years of education: None  . Highest education level: None  Social Needs  . Financial resource strain: Not very hard  . Food insecurity - worry: Never true  . Food insecurity - inability: Never true  . Transportation needs - medical: No  . Transportation needs - non-medical: No  Occupational History  . Occupation: Disabled    Employer: UNEMPLOYED  Tobacco Use  . Smoking status: Former Smoker    Last attempt to quit: 09/16/2003    Years since quitting: 14.1  . Smokeless tobacco: Never Used  Substance and Sexual Activity  . Alcohol use: Yes    Alcohol/week: 0.5 oz    Types: 1 Standard drinks or equivalent per week    Comment: 3-4 drinks per month  . Drug use: Yes    Comment: Quit in 1980  . Sexual activity: Yes  Other Topics Concern  . None  Social History Narrative   Married   Lives in [place]    3 children   Daily caffeine use - 3 drinks daily   Does not get regular exercise      [employer] on file.   Tobacco Counseling Counseling given: Not Answered  Activities of Daily Living In your present state of health, do you have any difficulty performing the following activities: 11/11/2017  Hearing? N  Vision? N  Difficulty concentrating or making decisions? N  Walking or climbing stairs? N  Dressing or bathing? N  Doing errands, shopping? N  Preparing Food and eating ? N  Using the Toilet? N  In the past six months, have you accidently leaked urine? N  Do you have problems with loss of bowel control? N  Managing your  Medications? N  Managing your Finances? N  Housekeeping or managing your Housekeeping? N  Some recent data might be hidden     Immunizations and Health Maintenance Immunization History  Administered Date(s) Administered  . Influenza Whole 11/08/2009  . Influenza,inj,Quad PF,6+ Mos 05/12/2013, 08/31/2014, 05/06/2017  . Influenza-Unspecified 07/19/2015  . Pneumococcal Conjugate-13 10/26/2014  . Pneumococcal Polysaccharide-23 05/12/2013  . Td 09/15/1997, 11/02/2008   Health Maintenance Due  Topic Date Due  . OPHTHALMOLOGY EXAM  09/15/2017    Patient Care Team: Biagio Borg, MD as PCP - General  Indicate any recent Medical Services you may have received from other than [facility] providers in the past year (date may be approximate).    Assessment:   This is a routine wellness examination for [place]. Physical assessment deferred to PCP.   Hearing/Vision screen  Visual Acuity Screening   Right eye Left eye Both eyes  Without correction:   20/32  With correction:     Comments: Patient states he will make an eye appointment soon. He does not have a regular ophthalmologist, education was provided regarding retinopathy.    Hearing Screening Comments: Able to hear conversational tones w/o difficulty. No issues reported.  Passed whisper  test   Dietary issues and exercise activities discussed: Current Exercise Habits: The patient does not participate in regular exercise at present Diet (meal preparation, eat out, water intake, caffeinated beverages, dairy products, fruits and vegetables): in general, a "healthy" diet     Reviewed heart healthy and diabetic diet, encouraged patient to increase daily water intake.  Goals    . Patient Stated     Continue to decrease the amount of soda I drink then decrease the amount tea.       Depression Screen PHQ 2/9 Scores 11/11/2017 06/04/2017 01/21/2017 04/29/2016  PHQ - 2 Score 1 0 0 0  PHQ- 9 Score 4 - - -    Fall Risk Fall Risk  11/11/2017 06/04/2017 05/06/2017 01/21/2017 04/29/2016  Falls in the past year? No No No No No   Cognitive Function:       Ad8 score reviewed for issues:  Issues making decisions: no  Less interest in hobbies / activities: no  Repeats questions, stories (family complaining): no  Trouble using ordinary gadgets (microwave, computer, phone):no  Forgets the month or year: no  Mismanaging finances: no  Remembering appts: no  Daily problems with thinking and/or memory: no Ad8 score is= 0    Screening Tests Health Maintenance  Topic Date Due  . OPHTHALMOLOGY EXAM  09/15/2017  . FOOT EXAM  05/06/2018  . HEMOGLOBIN A1C  05/09/2018  . PNEUMOCOCCAL POLYSACCHARIDE VACCINE (2) 05/12/2018  . TETANUS/TDAP  11/02/2018  . COLONOSCOPY  02/16/2021  . INFLUENZA VACCINE  Completed  . Hepatitis C Screening  Completed  . HIV Screening  Completed      Plan:    Continue doing brain stimulating activities (puzzles, reading, adult coloring books, staying active) to keep memory sharp.   Continue to eat heart healthy diet (full of fruits, vegetables, whole grains, lean protein, water--limit salt, fat, and sugar intake) and increase physical activity as tolerated.  I have personally reviewed and noted the following in the patient's chart:   . Medical and  social history . Use of alcohol, tobacco or illicit drugs  . Current medications and supplements . Functional ability and status . Nutritional status . Physical activity . Advanced directives . List of other physicians . Vitals . Screenings to include cognitive, depression, and  falls . Referrals and appointments  In addition, I have reviewed and discussed with patient certain preventive protocols, quality metrics, and best practice recommendations. A written personalized care plan for preventive services as well as general preventive health recommendations were provided to patient.     Michiel Cowboy, RN   11/11/2017   Medical screening examination/treatment/procedure(s) were performed by non-physician practitioner and as supervising physician I was immediately available for consultation/collaboration. I agree with above. Cathlean Cower, MD

## 2017-11-11 NOTE — Patient Instructions (Addendum)
Please take all new medication as prescribed - the Trulicity shots once per wk  Please have the pharmacy call us with a different medication Name that would be similar if the Trulicity is too expensive  Please continue all other medications as before, and refills have been done if requested.  Please have the pharmacy call with any other refills you may need.  Please keep your appointments with your specialists as you may have planned  Please go to the XRAY Department in the Basement (go straight as you get off the elevator) for the x-ray testing  Please return in 6 months, or sooner if needed, with Lab testing done 3-5 days before

## 2017-11-11 NOTE — Patient Instructions (Signed)
Continue doing brain stimulating activities (puzzles, reading, adult coloring books, staying active) to keep memory sharp.   Continue to eat heart healthy diet (full of fruits, vegetables, whole grains, lean protein, water--limit salt, fat, and sugar intake) and increase physical activity as tolerated.   Keith [name] , Thank you for taking time to come for your Medicare Wellness Visit. I appreciate your ongoing commitment to your health goals. Please review the following plan we discussed and let me know if I can assist you in the future.   These are the goals we discussed: Goals    . Patient Stated     Continue to decrease the amount of soda I drink then decrease the amount tea.        This is a list of the screening recommended for you and due dates:  Health Maintenance  Topic Date Due  . Eye exam for diabetics  09/15/2017  . Complete foot exam   05/06/2018  . Hemoglobin A1C  05/09/2018  . Pneumococcal vaccine (2) 05/12/2018  . Tetanus Vaccine  11/02/2018  . Colon Cancer Screening  02/16/2021  . Flu Shot  Completed  .  Hepatitis C: One time screening is recommended by Center for Disease Control  (CDC) for  adults born from [birth year] through 1964.   Completed  . HIV Screening  Completed

## 2017-11-14 ENCOUNTER — Encounter: Payer: Self-pay | Admitting: Internal Medicine

## 2017-11-14 DIAGNOSIS — R5383 Other fatigue: Secondary | ICD-10-CM | POA: Insufficient documentation

## 2017-11-14 DIAGNOSIS — Z77090 Contact with and (suspected) exposure to asbestos: Secondary | ICD-10-CM | POA: Insufficient documentation

## 2017-11-14 NOTE — Assessment & Plan Note (Signed)
Ok for cxr per pt request,  to f/u any worsening symptoms or concerns

## 2017-11-14 NOTE — Assessment & Plan Note (Signed)
stable overall by history and exam, recent data reviewed with pt, and pt to continue medical treatment as before,  to f/u any worsening symptoms or concerns BP Readings from Last 3 Encounters:  11/11/17 124/86  11/11/17 124/86  06/04/17 140/90

## 2017-11-14 NOTE — Assessment & Plan Note (Signed)
Etiology unclear, Exam otherwise benign, to check labs as documented, follow with expectant management  

## 2017-11-14 NOTE — Assessment & Plan Note (Signed)
Lab Results  Component Value Date   LDLCALC 92 05/01/2017  stable overall by history and exam, recent data reviewed with pt, and pt to continue medical treatment as before,  to f/u any worsening symptoms or concerns

## 2017-11-14 NOTE — Assessment & Plan Note (Addendum)
Uncontrolled, o/w stable overall by history and exam, recent data reviewed with pt, and pt to continue medical treatment as before,  to f/u any worsening symptoms or concerns, to add trulicity once weekly for better control, cont diet Lab Results  Component Value Date   HGBA1C 8.7 (H) 11/09/2017

## 2017-12-17 ENCOUNTER — Telehealth: Payer: Self-pay | Admitting: *Deleted

## 2017-12-17 NOTE — Telephone Encounter (Signed)
Patient states that he was under the impression that PCP was going to prescribe another medication other than Trulicity to control his diabetes.  Patient states that he would rather not have to take a shot and he is uncertain if his insurance will cover the cost of Trulicity. He would like to know if there are any other options.

## 2017-12-18 NOTE — Telephone Encounter (Signed)
Called patient and informed him that PCP does want him to take Trulicity. It was discussed for the patient to contact his pharmacy to inquire about the cost and if Keith [name] feels he cannot afford the medication to ask his pharmacy to contact Dr. Gwynn Burly office and another similar medication will be prescribed. [name] verbalized understanding and was appreciative for the callback.

## 2017-12-18 NOTE — Telephone Encounter (Signed)
As we discussed at his office visit, I do not and can not know which medications are covered by his insurance  Yes, pt needs the Trulicity for treatment (or a medication similar)  Yes, pt should definitely address the cost issue with his pharmacist, and if not affordable, have the pharmacy let us know which similar medication is covered by his plan; that way, we reduce the guessing games and recurrent notes and phone calls

## 2018-01-10 ENCOUNTER — Other Ambulatory Visit: Payer: Self-pay | Admitting: Internal Medicine

## 2018-01-12 ENCOUNTER — Other Ambulatory Visit: Payer: Self-pay

## 2018-01-12 ENCOUNTER — Other Ambulatory Visit (INDEPENDENT_AMBULATORY_CARE_PROVIDER_SITE_OTHER): Payer: Medicare HMO

## 2018-01-12 LAB — COMPREHENSIVE METABOLIC PANEL
ALK PHOS: 68 U/L (ref 39–117)
ALT: 47 U/L (ref 0–53)
AST: 32 U/L (ref 0–37)
Albumin: 4.2 g/dL (ref 3.5–5.2)
BUN: 16 mg/dL (ref 6–23)
CALCIUM: 9.8 mg/dL (ref 8.4–10.5)
CO2: 26 mEq/L (ref 19–32)
Chloride: 100 mEq/L (ref 96–112)
Creatinine, Ser: 1.09 mg/dL (ref 0.40–1.50)
GFR: 73.15 mL/min (ref >60.00)
GLUCOSE: 186 mg/dL — AB (ref 70–99)
Potassium: 4.2 mEq/L (ref 3.5–5.1)
Sodium: 136 mEq/L (ref 135–145)
TOTAL PROTEIN: 7.6 g/dL (ref 6.0–8.3)
Total Bilirubin: 0.8 mg/dL (ref 0.2–1.2)

## 2018-01-12 LAB — CBC WITH DIFFERENTIAL/PLATELET
Basophils Absolute: 0.1 10*3/uL (ref 0.0–0.1)
Basophils Relative: 1 % (ref 0.0–3.0)
EOS PCT: 3.2 % (ref 0.0–5.0)
Eosinophils Absolute: 0.3 10*3/uL (ref 0.0–0.7)
HCT: 46.2 % (ref 39.0–52.0)
Hemoglobin: 15.8 g/dL (ref 13.0–17.0)
LYMPHS ABS: 2.4 10*3/uL (ref 0.7–4.0)
Lymphocytes Relative: 27.4 % (ref 12.0–46.0)
MCHC: 34.2 g/dL (ref 30.0–36.0)
MCV: 89.4 fl (ref 78.0–100.0)
MONOS PCT: 9.5 % (ref 3.0–12.0)
Monocytes Absolute: 0.8 10*3/uL (ref 0.1–1.0)
NEUTROS ABS: 5.1 10*3/uL (ref 1.4–7.7)
NEUTROS PCT: 58.9 % (ref 43.0–77.0)
PLATELETS: 188 10*3/uL (ref 150.0–400.0)
RBC: 5.17 Mil/uL (ref 4.22–5.81)
RDW: 14 % (ref 11.5–15.5)
WBC: 8.7 10*3/uL (ref 4.0–10.5)

## 2018-01-12 LAB — IBC PANEL
IRON: 99 ug/dL (ref 42–165)
Saturation Ratios: 30.5 % (ref 20.0–50.0)
TRANSFERRIN: 232 mg/dL (ref 212.0–360.0)

## 2018-01-12 LAB — FERRITIN: Ferritin: 75.5 ng/mL (ref 22.0–322.0)

## 2018-02-26 ENCOUNTER — Telehealth: Payer: Self-pay | Admitting: Internal Medicine

## 2018-02-26 NOTE — Telephone Encounter (Signed)
Spoke with a representative and provided the side B fax number for them to try.

## 2018-02-26 NOTE — Telephone Encounter (Signed)
Copied from [place] [id number]. Topic: Inquiry >> Feb 26, 2018 11:08 AM Oliver Pila B wrote: Reason for CRM: Mcarthur Rossetti has called to ask if the form of "Drug Therapy Alert" from [facility] has been filled b/c they havent received anything back concerning the form they also state the form has been sent 3x, contact [place] @ [phone]

## 2018-03-29 ENCOUNTER — Other Ambulatory Visit: Payer: Self-pay | Admitting: Internal Medicine

## 2018-04-13 DIAGNOSIS — Z1283 Encounter for screening for malignant neoplasm of skin: Secondary | ICD-10-CM | POA: Diagnosis not present

## 2018-04-13 DIAGNOSIS — L820 Inflamed seborrheic keratosis: Secondary | ICD-10-CM | POA: Diagnosis not present

## 2018-04-13 DIAGNOSIS — L821 Other seborrheic keratosis: Secondary | ICD-10-CM | POA: Diagnosis not present

## 2018-05-10 ENCOUNTER — Other Ambulatory Visit (INDEPENDENT_AMBULATORY_CARE_PROVIDER_SITE_OTHER): Payer: Medicare HMO

## 2018-05-10 DIAGNOSIS — Z77090 Contact with and (suspected) exposure to asbestos: Secondary | ICD-10-CM | POA: Diagnosis not present

## 2018-05-10 DIAGNOSIS — E291 Testicular hypofunction: Secondary | ICD-10-CM

## 2018-05-10 DIAGNOSIS — E119 Type 2 diabetes mellitus without complications: Secondary | ICD-10-CM

## 2018-05-10 LAB — CBC WITH DIFFERENTIAL/PLATELET
BASOS PCT: 1.5 % (ref 0.0–3.0)
Basophils Absolute: 0.1 10*3/uL (ref 0.0–0.1)
EOS ABS: 0.2 10*3/uL (ref 0.0–0.7)
Eosinophils Relative: 2.4 % (ref 0.0–5.0)
HCT: 45.1 % (ref 39.0–52.0)
Hemoglobin: 15.4 g/dL (ref 13.0–17.0)
LYMPHS ABS: 2.2 10*3/uL (ref 0.7–4.0)
Lymphocytes Relative: 26.6 % (ref 12.0–46.0)
MCHC: 34.2 g/dL (ref 30.0–36.0)
MCV: 90.7 fl (ref 78.0–100.0)
MONO ABS: 0.8 10*3/uL (ref 0.1–1.0)
Monocytes Relative: 9.8 % (ref 3.0–12.0)
NEUTROS PCT: 59.7 % (ref 43.0–77.0)
Neutro Abs: 5 10*3/uL (ref 1.4–7.7)
PLATELETS: 193 10*3/uL (ref 150.0–400.0)
RBC: 4.97 Mil/uL (ref 4.22–5.81)
RDW: 13.7 % (ref 11.5–15.5)
WBC: 8.3 10*3/uL (ref 4.0–10.5)

## 2018-05-10 LAB — URINALYSIS, ROUTINE W REFLEX MICROSCOPIC
Bilirubin Urine: NEGATIVE
Hgb urine dipstick: NEGATIVE
Ketones, ur: NEGATIVE
Leukocytes, UA: NEGATIVE
Nitrite: NEGATIVE
PH: 5.5 (ref 5.0–8.0)
RBC / HPF: NONE SEEN (ref 0–?)
SPECIFIC GRAVITY, URINE: 1.02 (ref 1.000–1.030)
TOTAL PROTEIN, URINE-UPE24: NEGATIVE
URINE GLUCOSE: 500 — AB
Urobilinogen, UA: 0.2 (ref 0.0–1.0)

## 2018-05-10 LAB — TESTOSTERONE: Testosterone: 175.91 ng/dL — ABNORMAL LOW (ref 300.00–890.00)

## 2018-05-10 LAB — BASIC METABOLIC PANEL
BUN: 14 mg/dL (ref 6–23)
CHLORIDE: 101 meq/L (ref 96–112)
CO2: 27 mEq/L (ref 19–32)
Calcium: 9.7 mg/dL (ref 8.4–10.5)
Creatinine, Ser: 1.16 mg/dL (ref 0.40–1.50)
GFR: 68.01 mL/min (ref >60.00)
GLUCOSE: 176 mg/dL — AB (ref 70–99)
Potassium: 4.1 mEq/L (ref 3.5–5.1)
SODIUM: 137 meq/L (ref 135–145)

## 2018-05-10 LAB — LIPID PANEL
CHOL/HDL RATIO: 6
Cholesterol: 161 mg/dL (ref 0–200)
HDL: 27 mg/dL — ABNORMAL LOW (ref >39.00)
NonHDL: 133.55
Triglycerides: 210 mg/dL — ABNORMAL HIGH (ref 0.0–149.0)
VLDL: 42 mg/dL — ABNORMAL HIGH (ref 0.0–40.0)

## 2018-05-10 LAB — MICROALBUMIN / CREATININE URINE RATIO
CREATININE, U: 100.8 mg/dL
Microalb Creat Ratio: 0.7 mg/g (ref 0.0–30.0)

## 2018-05-10 LAB — HEPATIC FUNCTION PANEL
ALK PHOS: 66 U/L (ref 39–117)
ALT: 34 U/L (ref 0–53)
AST: 20 U/L (ref 0–37)
Albumin: 4.2 g/dL (ref 3.5–5.2)
BILIRUBIN TOTAL: 1 mg/dL (ref 0.2–1.2)
Bilirubin, Direct: 0.1 mg/dL (ref 0.0–0.3)
Total Protein: 7.4 g/dL (ref 6.0–8.3)

## 2018-05-10 LAB — LDL CHOLESTEROL, DIRECT: LDL DIRECT: 102 mg/dL

## 2018-05-10 LAB — HEMOGLOBIN A1C: HEMOGLOBIN A1C: 8.3 % — AB (ref 4.6–6.5)

## 2018-05-10 LAB — PSA: PSA: 0.86 ng/mL (ref 0.10–4.00)

## 2018-05-10 LAB — TSH: TSH: 4.38 u[IU]/mL (ref 0.35–4.50)

## 2018-05-12 ENCOUNTER — Encounter: Payer: Self-pay | Admitting: Internal Medicine

## 2018-05-12 ENCOUNTER — Ambulatory Visit (INDEPENDENT_AMBULATORY_CARE_PROVIDER_SITE_OTHER): Payer: Medicare HMO | Admitting: Internal Medicine

## 2018-05-12 VITALS — BP 128/88 | HR 76 | Temp 98.5°F | Ht 71.0 in | Wt 232.0 lb

## 2018-05-12 DIAGNOSIS — Z0000 Encounter for general adult medical examination without abnormal findings: Secondary | ICD-10-CM | POA: Diagnosis not present

## 2018-05-12 DIAGNOSIS — E119 Type 2 diabetes mellitus without complications: Secondary | ICD-10-CM | POA: Diagnosis not present

## 2018-05-12 MED ORDER — DIAZEPAM 5 MG PO TABS: 5.0000 mg | ORAL_TABLET | Freq: Every day | ORAL | 0 refills | Status: DC | PRN

## 2018-05-12 NOTE — Assessment & Plan Note (Signed)
Mild uncontrolled, to add actos 15 qd

## 2018-05-12 NOTE — Assessment & Plan Note (Signed)

## 2018-05-12 NOTE — Patient Instructions (Signed)
Please continue all other medications as before, and refills have been done if requested - the valium  Please take all new medication as prescribed - the actos at 15 mg per day  Please have the pharmacy call with any other refills you may need.  Please continue your efforts at being more active, low cholesterol diet, and weight control.  You are otherwise up to date with prevention measures today.  Please keep your appointments with your specialists as you may have planned  Please return in 6 months, or sooner if needed, with Lab testing done 3-5 days before

## 2018-05-12 NOTE — Progress Notes (Signed)
Subjective:    Patient ID: Keith [name], male    DOB: [birth year], 62 y.o.   MRN: [id number]  HPI  Here for wellness and f/u;  Overall doing ok;  Pt denies Chest pain, worsening SOB, DOE, wheezing, orthopnea, PND, worsening LE edema, palpitations, dizziness or syncope.  Pt denies neurological change such as new headache, facial or extremity weakness.  Pt denies polydipsia, polyuria, or low sugar symptoms. Pt states overall good compliance with treatment and medications, good tolerability, and has been trying to follow appropriate diet.  Pt denies worsening depressive symptoms, suicidal ideation or panic. No fever, night sweats, wt loss, loss of appetite, or other constitutional symptoms.  Pt states good ability with ADL's, has low fall risk, home safety reviewed and adequate, no other significant changes in hearing or vision, and only occasionally active with exercise due to chronic back and neck pain, disabled since 1989,  Statin intolerant.  Not taking the trulicity due to cost.  Past Medical History:  Diagnosis Date  . Anxiety   . Arthritis   . Cardiomyopathy    Echo (5/11) showed EF 30% with diffuse hypokinesis, normal wall thickness, mildly decreased RV systolic function. This may be a cardiomyopathy due to hemochromatosis versus tachy-mediated. Unable to get cardiac MRI due to claustrophobia.  . Cervical spine pain    Chronic  . Chest pain    Exercise/adenosine Myoview showed EF 50% (3/10). Inferior hypokinesis. Perfusion images, inferior scar and ischemia in the apical anterior septum and in teh inferior wall . Suggestive of a CD. LHC (3/10) showed only lu minal irregulatrities in the coronaries w/ EF 55%, suggesting that myoview was a false positive, likely from diaphragmatic attnuation.  . Diabetes mellitus   . Diverticulosis   . Fatty liver   . GERD (gastroesophageal reflux disease)   . Heart murmur   . Hereditary hemochromatosis    Compound heterozygote. Pt gets periodic  phlebotomies  . History of colonic polyps   . Hyperlipidemia   . Hypertension   . Low back pain    The pt is S/P spinal fusion surgeries  . Multifocal atrial tachycardia (HCC)    Wandering atrial pacemaker. Holter monior in 5/11 showed frequent runs of symtomatic MAT with heart rate around 100. Pt is now  on amiodarone to try to suppress MAT. PFTs (6/11): FVC 105%, FEV1 109%, TLC 112%, DLCO 87% (minimal obstructive effect). LFTs stable on amiodarone  . Peptic ulcer disease   . Thyroid nodule    Past Surgical History:  Procedure Laterality Date  . BACK SURGERY  1991   Lumbar  . INGUINAL HERNIA REPAIR    . SPINE SURGERY    . TONSILLECTOMY      reports that he quit smoking about 14 years ago. He has never used smokeless tobacco. He reports that he drinks about 1.0 standard drinks of alcohol per week. He reports that he has current or past drug history. family history includes Alcohol abuse in his father; Colitis in his mother; Colon polyps in his mother; Diabetes in his mother, paternal grandfather, and paternal grandmother; Hypertension in his father; Multiple sclerosis in his mother and sister. Allergies  Allergen Reactions  . Lipitor [Atorvastatin]     myalgias  . Metoclopramide Hcl   . Penicillins   . Sertraline Hcl    Current Outpatient Medications on File Prior to Visit  Medication Sig Dispense Refill  . aspirin 81 MG EC tablet Take 81 mg by mouth daily.      Marland Kitchen  Cholecalciferol (VITAMIN D-3 PO) Take 1 tablet by mouth daily. PATIENT TAKES ONCE TABLET BY MOUTH ONCE DAILY . PATIENT NOT SURE OF DOSEAGE.    Marland Kitchen diazepam (VALIUM) 5 MG tablet Take 1 tablet (5 mg total) by mouth daily as needed for anxiety. 30 tablet 1  . glipiZIDE (GLUCOTROL XL) 10 MG 24 hr tablet TAKE 1 TABLET DAILY WITH BREAKFAST 90 tablet 2  . glucosamine-chondroitin 500-400 MG tablet Take 1 tablet by mouth 3 (three) times daily.    Marland Kitchen ibuprofen (ADVIL,MOTRIN) 400 MG tablet Take 400 mg by mouth every 6 (six) hours as  needed for headache or moderate pain. Reported on 10/30/2015    . levothyroxine (SYNTHROID, LEVOTHROID) 25 MCG tablet TAKE 1 TABLET EVERY DAY 90 tablet 2  . losartan (COZAAR) 50 MG tablet TAKE 1 TABLET EVERY DAY 90 tablet 3  . metFORMIN (GLUCOPHAGE-XR) 500 MG 24 hr tablet TAKE 4 TABLETS EVERY DAY WITH BREAKFAST 360 tablet 2  . metoprolol succinate (TOPROL-XL) 100 MG 24 hr tablet TAKE 1 TABLET TWICE DAILY 180 tablet 2  . Omega-3 Fatty Acids (FISH OIL PO) Take 1 tablet by mouth daily.     . pantoprazole (PROTONIX) 40 MG tablet TAKE 1 TABLET EVERY DAY 90 tablet 2  . triamcinolone (NASACORT AQ) 55 MCG/ACT AERO nasal inhaler Place 2 sprays into the nose daily. 1 Inhaler 12   No current facility-administered medications on file prior to visit.    Review of Systems Constitutional: Negative for other unusual diaphoresis, sweats, appetite or weight changes HENT: Negative for other worsening hearing loss, ear pain, facial swelling, mouth sores or neck stiffness.   Eyes: Negative for other worsening pain, redness or other visual disturbance.  Respiratory: Negative for other stridor or swelling Cardiovascular: Negative for other palpitations or other chest pain  Gastrointestinal: Negative for worsening diarrhea or loose stools, blood in stool, distention or other pain Genitourinary: Negative for hematuria, flank pain or other change in urine volume.  Musculoskeletal: Negative for myalgias or other joint swelling.  Skin: Negative for other color change, or other wound or worsening drainage.  Neurological: Negative for other syncope or numbness. Hematological: Negative for other adenopathy or swelling Psychiatric/Behavioral: Negative for hallucinations, other worsening agitation, SI, self-injury, or new decreased concentration All other system neg per pt    Objective:   Physical Exam BP 128/88   Pulse 76   Temp 98.5 F (36.9 C) (Oral)   Ht 5\' 11"  (1.803 m)   Wt 232 lb (105.2 kg)   SpO2 95%   BMI  32.36 kg/m  VS noted,  Constitutional: Pt is oriented to person, place, and time. Appears well-developed and well-nourished, in no significant distress and comfortable Head: Normocephalic and atraumatic  Eyes: Conjunctivae and EOM are normal. Pupils are equal, round, and reactive to light Right Ear: External ear normal without discharge Left Ear: External ear normal without discharge Nose: Nose without discharge or deformity Mouth/Throat: Oropharynx is without other ulcerations and moist  Neck: Normal range of motion. Neck supple. No JVD present. No tracheal deviation present or significant neck LA or mass Cardiovascular: Normal rate, regular rhythm, normal heart sounds and intact distal pulses.   Pulmonary/Chest: WOB normal and breath sounds without rales or wheezing  Abdominal: Soft. Bowel sounds are normal. NT. No HSM  Musculoskeletal: Normal range of motion. Exhibits no edema Lymphadenopathy: Has no other cervical adenopathy.  Neurological: Pt is alert and oriented to person, place, and time. Pt has normal reflexes. No cranial nerve deficit. Motor grossly  intact, Gait intact Skin: Skin is warm and dry. No rash noted or new ulcerations Psychiatric:  Has normal mood and affect. Behavior is normal without agitation No other exam findings Lab Results  Component Value Date   WBC 8.3 05/10/2018   HGB 15.4 05/10/2018   HCT 45.1 05/10/2018   PLT 193.0 05/10/2018   GLUCOSE 176 (H) 05/10/2018   CHOL 161 05/10/2018   TRIG 210.0 (H) 05/10/2018   HDL 27.00 (L) 05/10/2018   LDLDIRECT 102.0 05/10/2018   LDLCALC 92 05/01/2017   ALT 34 05/10/2018   AST 20 05/10/2018   NA 137 05/10/2018   K 4.1 05/10/2018   CL 101 05/10/2018   CREATININE 1.16 05/10/2018   BUN 14 05/10/2018   CO2 27 05/10/2018   TSH 4.38 05/10/2018   PSA 0.86 05/10/2018   INR 1.1 (H) 07/09/2016   HGBA1C 8.3 (H) 05/10/2018   MICROALBUR <0.7 05/10/2018       Assessment & Plan:

## 2018-06-03 ENCOUNTER — Telehealth: Payer: Self-pay | Admitting: Internal Medicine

## 2018-06-03 MED ORDER — PIOGLITAZONE HCL 15 MG PO TABS: 15.0000 mg | ORAL_TABLET | Freq: Every day | ORAL | 3 refills | Status: DC

## 2018-06-03 NOTE — Telephone Encounter (Signed)
Copied from [place] [id number]. Topic: General - Other >> Jun 03, 2018  8:07 AM Alfredia Ferguson R wrote: Pt called in and stated when he came in on 05/10/2018 for his appt he was told by Dr Jenny Reichmann that he was sending a generic med in to replace trulicity in a pill form and he still hasnt received a med and pharmacy states they have not received an order.  Cb# [id number]

## 2018-06-03 NOTE — Telephone Encounter (Signed)
Ok, this is done 

## 2018-06-30 DIAGNOSIS — H52223 Regular astigmatism, bilateral: Secondary | ICD-10-CM | POA: Diagnosis not present

## 2018-06-30 DIAGNOSIS — H524 Presbyopia: Secondary | ICD-10-CM | POA: Diagnosis not present

## 2018-06-30 LAB — HM DIABETES EYE EXAM

## 2018-07-30 ENCOUNTER — Other Ambulatory Visit: Payer: Self-pay

## 2018-07-30 ENCOUNTER — Other Ambulatory Visit (INDEPENDENT_AMBULATORY_CARE_PROVIDER_SITE_OTHER): Payer: Medicare HMO

## 2018-07-30 LAB — CBC WITH DIFFERENTIAL/PLATELET
BASOS ABS: 0.1 10*3/uL (ref 0.0–0.1)
Basophils Relative: 1.1 % (ref 0.0–3.0)
EOS ABS: 0.2 10*3/uL (ref 0.0–0.7)
EOS PCT: 2.6 % (ref 0.0–5.0)
HCT: 47.1 % (ref 39.0–52.0)
HEMOGLOBIN: 16.3 g/dL (ref 13.0–17.0)
LYMPHS ABS: 2.3 10*3/uL (ref 0.7–4.0)
Lymphocytes Relative: 26.8 % (ref 12.0–46.0)
MCHC: 34.7 g/dL (ref 30.0–36.0)
MCV: 90.2 fl (ref 78.0–100.0)
MONO ABS: 0.8 10*3/uL (ref 0.1–1.0)
Monocytes Relative: 9.7 % (ref 3.0–12.0)
NEUTROS PCT: 59.8 % (ref 43.0–77.0)
Neutro Abs: 5.1 10*3/uL (ref 1.4–7.7)
Platelets: 188 10*3/uL (ref 150.0–400.0)
RBC: 5.22 Mil/uL (ref 4.22–5.81)
RDW: 13.4 % (ref 11.5–15.5)
WBC: 8.5 10*3/uL (ref 4.0–10.5)

## 2018-07-30 LAB — COMPREHENSIVE METABOLIC PANEL
ALT: 39 U/L (ref 0–53)
AST: 28 U/L (ref 0–37)
Albumin: 4.4 g/dL (ref 3.5–5.2)
Alkaline Phosphatase: 60 U/L (ref 39–117)
BUN: 18 mg/dL (ref 6–23)
CALCIUM: 9.8 mg/dL (ref 8.4–10.5)
CHLORIDE: 101 meq/L (ref 96–112)
CO2: 28 meq/L (ref 19–32)
Creatinine, Ser: 1.18 mg/dL (ref 0.40–1.50)
GFR: 66.63 mL/min (ref >60.00)
Glucose, Bld: 233 mg/dL — ABNORMAL HIGH (ref 70–99)
POTASSIUM: 4.7 meq/L (ref 3.5–5.1)
Sodium: 138 mEq/L (ref 135–145)
Total Bilirubin: 0.6 mg/dL (ref 0.2–1.2)
Total Protein: 7.7 g/dL (ref 6.0–8.3)

## 2018-07-30 LAB — IBC PANEL
IRON: 47 ug/dL (ref 42–165)
SATURATION RATIOS: 13.4 % — AB (ref 20.0–50.0)
Transferrin: 251 mg/dL (ref 212.0–360.0)

## 2018-07-30 LAB — FERRITIN: FERRITIN: 61.8 ng/mL (ref 22.0–322.0)

## 2018-10-11 ENCOUNTER — Other Ambulatory Visit: Payer: Self-pay | Admitting: Internal Medicine

## 2018-11-12 ENCOUNTER — Other Ambulatory Visit (INDEPENDENT_AMBULATORY_CARE_PROVIDER_SITE_OTHER): Payer: Medicare HMO

## 2018-11-12 DIAGNOSIS — E119 Type 2 diabetes mellitus without complications: Secondary | ICD-10-CM | POA: Diagnosis not present

## 2018-11-12 LAB — LIPID PANEL
Cholesterol: 159 mg/dL (ref 0–200)
HDL: 27.4 mg/dL — ABNORMAL LOW (ref >39.00)
LDL Cholesterol: 92 mg/dL (ref 0–99)
NonHDL: 131.81
TRIGLYCERIDES: 199 mg/dL — AB (ref 0.0–149.0)
Total CHOL/HDL Ratio: 6
VLDL: 39.8 mg/dL (ref 0.0–40.0)

## 2018-11-12 LAB — BASIC METABOLIC PANEL
BUN: 17 mg/dL (ref 6–23)
CALCIUM: 9.4 mg/dL (ref 8.4–10.5)
CO2: 27 mEq/L (ref 19–32)
Chloride: 101 mEq/L (ref 96–112)
Creatinine, Ser: 1.11 mg/dL (ref 0.40–1.50)
GFR: 67.21 mL/min (ref >60.00)
Glucose, Bld: 188 mg/dL — ABNORMAL HIGH (ref 70–99)
Potassium: 4.5 mEq/L (ref 3.5–5.1)
Sodium: 136 mEq/L (ref 135–145)

## 2018-11-12 LAB — HEPATIC FUNCTION PANEL
ALT: 31 U/L (ref 0–53)
AST: 22 U/L (ref 0–37)
Albumin: 4.3 g/dL (ref 3.5–5.2)
Alkaline Phosphatase: 63 U/L (ref 39–117)
Bilirubin, Direct: 0.1 mg/dL (ref 0.0–0.3)
Total Bilirubin: 0.7 mg/dL (ref 0.2–1.2)
Total Protein: 7.3 g/dL (ref 6.0–8.3)

## 2018-11-12 LAB — HEMOGLOBIN A1C: Hgb A1c MFr Bld: 8.7 % — ABNORMAL HIGH (ref 4.6–6.5)

## 2018-11-16 ENCOUNTER — Ambulatory Visit (INDEPENDENT_AMBULATORY_CARE_PROVIDER_SITE_OTHER): Payer: Medicare HMO | Admitting: Internal Medicine

## 2018-11-16 ENCOUNTER — Encounter: Payer: Self-pay | Admitting: Internal Medicine

## 2018-11-16 VITALS — BP 118/76 | HR 71 | Temp 98.5°F | Ht 71.0 in | Wt 237.0 lb

## 2018-11-16 DIAGNOSIS — E119 Type 2 diabetes mellitus without complications: Secondary | ICD-10-CM

## 2018-11-16 DIAGNOSIS — Z0000 Encounter for general adult medical examination without abnormal findings: Secondary | ICD-10-CM

## 2018-11-16 DIAGNOSIS — E785 Hyperlipidemia, unspecified: Secondary | ICD-10-CM

## 2018-11-16 DIAGNOSIS — F411 Generalized anxiety disorder: Secondary | ICD-10-CM | POA: Diagnosis not present

## 2018-11-16 DIAGNOSIS — I10 Essential (primary) hypertension: Secondary | ICD-10-CM

## 2018-11-16 MED ORDER — ALPRAZOLAM 0.5 MG PO TABS: ORAL_TABLET | ORAL | 2 refills | Status: DC

## 2018-11-16 MED ORDER — DULAGLUTIDE 0.75 MG/0.5ML ~~LOC~~ SOAJ: 0.5000 mL | SUBCUTANEOUS | 3 refills | Status: DC

## 2018-11-16 NOTE — Assessment & Plan Note (Signed)
Mild uncontrolled, for lower chol diet, has been statin intolerant

## 2018-11-16 NOTE — Patient Instructions (Signed)
Please take all new medication as prescribed  - the trulicity, and the xanax as needed  Please continue all other medications as before, and refills have been done if requested.  Please have the pharmacy call with any other refills you may need.  Please continue your efforts at being more active, low cholesterol diet, and weight control.  Please keep your appointments with your specialists as you may have planned  Please return in 6 months, or sooner if needed, with Lab testing done 3-5 days before

## 2018-11-16 NOTE — Assessment & Plan Note (Signed)
stable overall by history and exam, recent data reviewed with pt, and pt to continue medical treatment as before,  to f/u any worsening symptoms or concerns  

## 2018-11-16 NOTE — Assessment & Plan Note (Signed)
For xanax refill, stable overall by history and exam, recent data reviewed with pt, and pt to continue medical treatment as before,  to f/u any worsening symptoms or concerns

## 2018-11-16 NOTE — Progress Notes (Signed)
Subjective:    Patient ID: Keith [name], male    DOB: 10-11-1955, 63 y.o.   MRN: [id number]  HPI  Here to f/u; overall doing ok,  Pt denies chest pain, increasing sob or doe, wheezing, orthopnea, PND, increased LE swelling, palpitations, dizziness or syncope.  Pt denies new neurological symptoms such as new headache, or facial or extremity weakness or numbness.  Pt denies polydipsia, polyuria, or low sugar episode.  Pt states overall good compliance with meds, mostly trying to follow appropriate diet, with wt overall stable,  but little exercise however. Wt Readings from Last 3 Encounters:  11/16/18 237 lb (107.5 kg)  05/12/18 232 lb (105.2 kg)  11/11/17 238 lb (108 kg)  Did not tolerate actos with HA, feeling bad all over, then weakness in the legs felt like maybe falling or passing out, so did not take after that.   Pt continues to have recurring LBP without change in severity, bowel or bladder change, fever, wt loss,  worsening LE pain/numbness/weakness, gait change or falls, and only takes rare valium for this.  Denies worsening depressive symptoms, suicidal ideation, or panic; has ongoing anxiety, asks for refill Past Medical History:  Diagnosis Date  . Anxiety   . Arthritis   . Cardiomyopathy    Echo (5/11) showed EF 30% with diffuse hypokinesis, normal wall thickness, mildly decreased RV systolic function. This may be a cardiomyopathy due to hemochromatosis versus tachy-mediated. Unable to get cardiac MRI due to claustrophobia.  . Cervical spine pain    Chronic  . Chest pain    Exercise/adenosine Myoview showed EF 50% (3/10). Inferior hypokinesis. Perfusion images, inferior scar and ischemia in the apical anterior septum and in teh inferior wall . Suggestive of a CD. LHC (3/10) showed only lu minal irregulatrities in the coronaries w/ EF 55%, suggesting that myoview was a false positive, likely from diaphragmatic attnuation.  . Diabetes mellitus   . Diverticulosis   . Fatty liver    . GERD (gastroesophageal reflux disease)   . Heart murmur   . Hereditary hemochromatosis    Compound heterozygote. Pt gets periodic phlebotomies  . History of colonic polyps   . Hyperlipidemia   . Hypertension   . Low back pain    The pt is S/P spinal fusion surgeries  . Multifocal atrial tachycardia (HCC)    Wandering atrial pacemaker. Holter monior in 5/11 showed frequent runs of symtomatic MAT with heart rate around 100. Pt is now  on amiodarone to try to suppress MAT. PFTs (6/11): FVC 105%, FEV1 109%, TLC 112%, DLCO 87% (minimal obstructive effect). LFTs stable on amiodarone  . Peptic ulcer disease   . Thyroid nodule    Past Surgical History:  Procedure Laterality Date  . BACK SURGERY  1991   Lumbar  . INGUINAL HERNIA REPAIR    . SPINE SURGERY    . TONSILLECTOMY      reports that he quit smoking about 15 years ago. He has never used smokeless tobacco. He reports current alcohol use of about 1.0 standard drinks of alcohol per week. He reports current drug use. family history includes Alcohol abuse in his father; Colitis in his mother; Colon polyps in his mother; Diabetes in his mother, paternal grandfather, and paternal grandmother; Hypertension in his father; Multiple sclerosis in his mother and sister. Allergies  Allergen Reactions  . Actos [Pioglitazone] Other (See Comments)    Feeling bad, weak, HA  . Lipitor [Atorvastatin]     myalgias  . Metoclopramide  Hcl   . Penicillins   . Sertraline Hcl   . Zocor [Simvastatin] Other (See Comments)    Myalgia    Current Outpatient Medications on File Prior to Visit  Medication Sig Dispense Refill  . aspirin 81 MG EC tablet Take 81 mg by mouth daily.      . Cholecalciferol (VITAMIN D-3 PO) Take 1 tablet by mouth daily. PATIENT TAKES ONCE TABLET BY MOUTH ONCE DAILY . PATIENT NOT SURE OF DOSEAGE.    Marland Kitchen diazepam (VALIUM) 5 MG tablet Take 1 tablet (5 mg total) by mouth daily as needed for muscle spasms or sedation. 30 tablet 0  .  glipiZIDE (GLUCOTROL XL) 10 MG 24 hr tablet TAKE 1 TABLET DAILY WITH BREAKFAST 90 tablet 1  . glucosamine-chondroitin 500-400 MG tablet Take 1 tablet by mouth 3 (three) times daily.    Marland Kitchen ibuprofen (ADVIL,MOTRIN) 400 MG tablet Take 400 mg by mouth every 6 (six) hours as needed for headache or moderate pain. Reported on 10/30/2015    . levothyroxine (SYNTHROID, LEVOTHROID) 25 MCG tablet TAKE 1 TABLET EVERY DAY 90 tablet 1  . losartan (COZAAR) 50 MG tablet TAKE 1 TABLET EVERY DAY 90 tablet 1  . metFORMIN (GLUCOPHAGE-XR) 500 MG 24 hr tablet TAKE 4 TABLETS EVERY DAY WITH BREAKFAST 360 tablet 1  . metoprolol succinate (TOPROL-XL) 100 MG 24 hr tablet TAKE 1 TABLET TWICE DAILY 180 tablet 1  . Omega-3 Fatty Acids (FISH OIL PO) Take 1 tablet by mouth daily.     . pantoprazole (PROTONIX) 40 MG tablet TAKE 1 TABLET EVERY DAY 90 tablet 1  . triamcinolone (NASACORT AQ) 55 MCG/ACT AERO nasal inhaler Place 2 sprays into the nose daily. 1 Inhaler 12   No current facility-administered medications on file prior to visit.    Review of Systems  Constitutional: Negative for other unusual diaphoresis or sweats HENT: Negative for ear discharge or swelling Eyes: Negative for other worsening visual disturbances Respiratory: Negative for stridor or other swelling  Gastrointestinal: Negative for worsening distension or other blood Genitourinary: Negative for retention or other urinary change Musculoskeletal: Negative for other MSK pain or swelling Skin: Negative for color change or other new lesions Neurological: Negative for worsening tremors and other numbness  Psychiatric/Behavioral: Negative for worsening agitation or other fatigue All other system neg per pt    Objective:   Physical Exam BP 118/76   Pulse 71   Temp 98.5 F (36.9 C) (Oral)   Ht 5\' 11"  (1.803 m)   Wt 237 lb (107.5 kg)   SpO2 95%   BMI 33.05 kg/m  VS noted,  Constitutional: Pt appears in NAD HENT: Head: NCAT.  Right Ear: External ear  normal.  Left Ear: External ear normal.  Eyes: . Pupils are equal, round, and reactive to light. Conjunctivae and EOM are normal Nose: without d/c or deformity Neck: Neck supple. Gross normal ROM Cardiovascular: Normal rate and regular rhythm.   Pulmonary/Chest: Effort normal and breath sounds without rales or wheezing.  Abd:  Soft, NT, ND, + BS, no organomegaly Neurological: Pt is alert. At baseline orientation, motor grossly intact Skin: Skin is warm. No rashes, other new lesions, no LE edema Psychiatric: Pt behavior is normal without agitation , 1+ nervous No o hter exam findings  Lab Results  Component Value Date   WBC 8.5 07/30/2018   HGB 16.3 07/30/2018   HCT 47.1 07/30/2018   PLT 188.0 07/30/2018   GLUCOSE 188 (H) 11/12/2018   CHOL 159 11/12/2018  TRIG 199.0 (H) 11/12/2018   HDL 27.40 (L) 11/12/2018   LDLDIRECT 102.0 05/10/2018   LDLCALC 92 11/12/2018   ALT 31 11/12/2018   AST 22 11/12/2018   NA 136 11/12/2018   K 4.5 11/12/2018   CL 101 11/12/2018   CREATININE 1.11 11/12/2018   BUN 17 11/12/2018   CO2 27 11/12/2018   TSH 4.38 05/10/2018   PSA 0.86 05/10/2018   INR 1.1 (H) 07/09/2016   HGBA1C 8.7 (H) 11/12/2018   MICROALBUR <0.7 05/10/2018        Assessment & Plan:

## 2018-12-14 ENCOUNTER — Other Ambulatory Visit: Payer: Self-pay | Admitting: Internal Medicine

## 2018-12-15 ENCOUNTER — Other Ambulatory Visit: Payer: Self-pay | Admitting: Internal Medicine

## 2019-01-13 ENCOUNTER — Telehealth: Payer: Self-pay | Admitting: *Deleted

## 2019-01-13 NOTE — Telephone Encounter (Signed)
Received a fax stating patient is not currently on a statin drug and may benefit from atorvastatin, rosuvastatin, pravastatin or lovastatin. I see he has intolerances (myalgia)  to Lipitor and Zocor.  Please advise.

## 2019-01-13 NOTE — Telephone Encounter (Signed)
Ok to hold on further tx at this time, thanks

## 2019-01-20 DIAGNOSIS — T1502XA Foreign body in cornea, left eye, initial encounter: Secondary | ICD-10-CM | POA: Diagnosis not present

## 2019-01-20 DIAGNOSIS — H18892 Other specified disorders of cornea, left eye: Secondary | ICD-10-CM | POA: Diagnosis not present

## 2019-01-21 DIAGNOSIS — T1502XD Foreign body in cornea, left eye, subsequent encounter: Secondary | ICD-10-CM | POA: Diagnosis not present

## 2019-02-17 ENCOUNTER — Other Ambulatory Visit (INDEPENDENT_AMBULATORY_CARE_PROVIDER_SITE_OTHER): Payer: Medicare HMO

## 2019-02-17 LAB — CBC WITH DIFFERENTIAL/PLATELET
Basophils Absolute: 0.1 10*3/uL (ref 0.0–0.1)
Basophils Relative: 0.8 % (ref 0.0–3.0)
Eosinophils Absolute: 0.3 10*3/uL (ref 0.0–0.7)
Eosinophils Relative: 3 % (ref 0.0–5.0)
HCT: 47.2 % (ref 39.0–52.0)
Hemoglobin: 16.3 g/dL (ref 13.0–17.0)
Lymphocytes Relative: 30.4 % (ref 12.0–46.0)
Lymphs Abs: 2.8 10*3/uL (ref 0.7–4.0)
MCHC: 34.6 g/dL (ref 30.0–36.0)
MCV: 89.6 fl (ref 78.0–100.0)
Monocytes Absolute: 0.8 10*3/uL (ref 0.1–1.0)
Monocytes Relative: 8.8 % (ref 3.0–12.0)
Neutro Abs: 5.3 10*3/uL (ref 1.4–7.7)
Neutrophils Relative %: 57 % (ref 43.0–77.0)
Platelets: 209 10*3/uL (ref 150.0–400.0)
RBC: 5.26 Mil/uL (ref 4.22–5.81)
RDW: 14 % (ref 11.5–15.5)
WBC: 9.3 10*3/uL (ref 4.0–10.5)

## 2019-02-17 LAB — COMPREHENSIVE METABOLIC PANEL
ALT: 19 U/L (ref 0–53)
AST: 15 U/L (ref 0–37)
Albumin: 4.3 g/dL (ref 3.5–5.2)
Alkaline Phosphatase: 66 U/L (ref 39–117)
BUN: 16 mg/dL (ref 6–23)
CO2: 27 mEq/L (ref 19–32)
Calcium: 9.5 mg/dL (ref 8.4–10.5)
Chloride: 101 mEq/L (ref 96–112)
Creatinine, Ser: 1.13 mg/dL (ref 0.40–1.50)
GFR: 65.78 mL/min (ref >60.00)
Glucose, Bld: 182 mg/dL — ABNORMAL HIGH (ref 70–99)
Potassium: 4.1 mEq/L (ref 3.5–5.1)
Sodium: 137 mEq/L (ref 135–145)
Total Bilirubin: 0.4 mg/dL (ref 0.2–1.2)
Total Protein: 7.6 g/dL (ref 6.0–8.3)

## 2019-02-17 LAB — IBC PANEL
Iron: 65 ug/dL (ref 42–165)
Saturation Ratios: 21.3 % (ref 20.0–50.0)
Transferrin: 218 mg/dL (ref 212.0–360.0)

## 2019-02-17 LAB — FERRITIN: Ferritin: 58.2 ng/mL (ref 22.0–322.0)

## 2019-02-22 ENCOUNTER — Other Ambulatory Visit: Payer: Self-pay | Admitting: Internal Medicine

## 2019-04-08 ENCOUNTER — Telehealth: Payer: Self-pay | Admitting: Internal Medicine

## 2019-04-08 NOTE — Telephone Encounter (Signed)
Please advise 

## 2019-04-08 NOTE — Telephone Encounter (Signed)
Pt called to tell Dr. Jenny Reichmann he would like to start taking Sildenafil and discussed at his last appt. Please advise.  [facility] [address], [place] [phone] (Phone) [phone] (Fax)

## 2019-04-11 MED ORDER — SILDENAFIL CITRATE 100 MG PO TABS: 50.0000 mg | ORAL_TABLET | Freq: Every day | ORAL | 11 refills | Status: DC | PRN

## 2019-04-11 NOTE — Addendum Note (Signed)
Addended by: Biagio Borg on: 04/11/2019 06:08 AM   Modules accepted: Orders

## 2019-04-11 NOTE — Telephone Encounter (Signed)
Done erx to local pharmacy

## 2019-04-12 ENCOUNTER — Telehealth: Payer: Self-pay | Admitting: Internal Medicine

## 2019-04-12 MED ORDER — SILDENAFIL CITRATE 20 MG PO TABS: ORAL_TABLET | ORAL | 11 refills | Status: DC

## 2019-04-12 NOTE — Telephone Encounter (Signed)
Done erx 

## 2019-04-12 NOTE — Telephone Encounter (Signed)
Jasmine, with [facility], states that patient is requesting 20 mg. She inquired if it could be resent to pharmacy and also if office could reach out to patient regarding change.

## 2019-04-12 NOTE — Telephone Encounter (Signed)
Medication Refill - Medication: SIDENAFIL 20MG  30 DAY SUPPLY Patient is wanting sildenafil (VIAGRA) 20mg  for 30 day supply sent to pharmacy and the wrong dosage and quantity was sent over.  Has the patient contacted their pharmacy? Yes (Agent: If no, request that the patient contact the pharmacy for the refill.) (Agent: If yes, when and what did the pharmacy advise?)Contact PCP  Preferred Pharmacy (with phone number or street name):  [facility], [place] [phone] (Phone) [phone] (Fax)    Agent: Please be advised that RX refills may take up to 3 business days. We ask that you follow-up with your pharmacy.

## 2019-05-16 ENCOUNTER — Other Ambulatory Visit (INDEPENDENT_AMBULATORY_CARE_PROVIDER_SITE_OTHER): Payer: Medicare HMO

## 2019-05-16 DIAGNOSIS — Z125 Encounter for screening for malignant neoplasm of prostate: Secondary | ICD-10-CM | POA: Diagnosis not present

## 2019-05-16 DIAGNOSIS — Z0000 Encounter for general adult medical examination without abnormal findings: Secondary | ICD-10-CM | POA: Diagnosis not present

## 2019-05-16 DIAGNOSIS — E119 Type 2 diabetes mellitus without complications: Secondary | ICD-10-CM

## 2019-05-16 LAB — LIPID PANEL
Cholesterol: 176 mg/dL (ref 0–200)
HDL: 27.4 mg/dL — ABNORMAL LOW (ref >39.00)
NonHDL: 148.19
Total CHOL/HDL Ratio: 6
Triglycerides: 364 mg/dL — ABNORMAL HIGH (ref 0.0–149.0)
VLDL: 72.8 mg/dL — ABNORMAL HIGH (ref 0.0–40.0)

## 2019-05-16 LAB — HEPATIC FUNCTION PANEL
ALT: 49 U/L (ref 0–53)
AST: 35 U/L (ref 0–37)
Albumin: 4.6 g/dL (ref 3.5–5.2)
Alkaline Phosphatase: 87 U/L (ref 39–117)
Bilirubin, Direct: 0.1 mg/dL (ref 0.0–0.3)
Total Bilirubin: 0.9 mg/dL (ref 0.2–1.2)
Total Protein: 7.7 g/dL (ref 6.0–8.3)

## 2019-05-16 LAB — URINALYSIS, ROUTINE W REFLEX MICROSCOPIC
Bilirubin Urine: NEGATIVE
Hgb urine dipstick: NEGATIVE
Ketones, ur: NEGATIVE
Leukocytes,Ua: NEGATIVE
Nitrite: NEGATIVE
RBC / HPF: NONE SEEN (ref 0–?)
Specific Gravity, Urine: 1.02 (ref 1.000–1.030)
Total Protein, Urine: NEGATIVE
Urine Glucose: >=1000 — AB
Urobilinogen, UA: 0.2 (ref 0.0–1.0)
WBC, UA: NONE SEEN (ref 0–?)
pH: 6 (ref 5.0–8.0)

## 2019-05-16 LAB — HEMOGLOBIN A1C: Hgb A1c MFr Bld: 10 % — ABNORMAL HIGH (ref 4.6–6.5)

## 2019-05-16 LAB — CBC WITH DIFFERENTIAL/PLATELET
Basophils Absolute: 0.1 10*3/uL (ref 0.0–0.1)
Basophils Relative: 1.4 % (ref 0.0–3.0)
Eosinophils Absolute: 0.2 10*3/uL (ref 0.0–0.7)
Eosinophils Relative: 1.7 % (ref 0.0–5.0)
HCT: 47.4 % (ref 39.0–52.0)
Hemoglobin: 16.3 g/dL (ref 13.0–17.0)
Lymphocytes Relative: 29.9 % (ref 12.0–46.0)
Lymphs Abs: 2.6 10*3/uL (ref 0.7–4.0)
MCHC: 34.4 g/dL (ref 30.0–36.0)
MCV: 89.6 fl (ref 78.0–100.0)
Monocytes Absolute: 0.9 10*3/uL (ref 0.1–1.0)
Monocytes Relative: 10.1 % (ref 3.0–12.0)
Neutro Abs: 5 10*3/uL (ref 1.4–7.7)
Neutrophils Relative %: 56.9 % (ref 43.0–77.0)
Platelets: 195 10*3/uL (ref 150.0–400.0)
RBC: 5.29 Mil/uL (ref 4.22–5.81)
RDW: 13.2 % (ref 11.5–15.5)
WBC: 8.8 10*3/uL (ref 4.0–10.5)

## 2019-05-16 LAB — BASIC METABOLIC PANEL
BUN: 13 mg/dL (ref 6–23)
CO2: 28 mEq/L (ref 19–32)
Calcium: 9.7 mg/dL (ref 8.4–10.5)
Chloride: 98 mEq/L (ref 96–112)
Creatinine, Ser: 1.11 mg/dL (ref 0.40–1.50)
GFR: 67.1 mL/min (ref >60.00)
Glucose, Bld: 245 mg/dL — ABNORMAL HIGH (ref 70–99)
Potassium: 4.2 mEq/L (ref 3.5–5.1)
Sodium: 136 mEq/L (ref 135–145)

## 2019-05-16 LAB — MICROALBUMIN / CREATININE URINE RATIO
Creatinine,U: 128 mg/dL
Microalb Creat Ratio: 0.8 mg/g (ref 0.0–30.0)
Microalb, Ur: 1 mg/dL (ref 0.0–1.9)

## 2019-05-16 LAB — PSA: PSA: 3.6 ng/mL (ref 0.10–4.00)

## 2019-05-16 LAB — TSH: TSH: 4.51 u[IU]/mL — ABNORMAL HIGH (ref 0.35–4.50)

## 2019-05-16 LAB — LDL CHOLESTEROL, DIRECT: Direct LDL: 86 mg/dL

## 2019-05-19 ENCOUNTER — Other Ambulatory Visit: Payer: Self-pay

## 2019-05-19 ENCOUNTER — Ambulatory Visit (INDEPENDENT_AMBULATORY_CARE_PROVIDER_SITE_OTHER): Payer: Medicare HMO | Admitting: Internal Medicine

## 2019-05-19 ENCOUNTER — Encounter: Payer: Self-pay | Admitting: Internal Medicine

## 2019-05-19 VITALS — BP 136/84 | HR 82 | Temp 98.3°F | Ht 71.0 in | Wt 227.0 lb

## 2019-05-19 DIAGNOSIS — Z0001 Encounter for general adult medical examination with abnormal findings: Secondary | ICD-10-CM

## 2019-05-19 DIAGNOSIS — N529 Male erectile dysfunction, unspecified: Secondary | ICD-10-CM

## 2019-05-19 DIAGNOSIS — I5022 Chronic systolic (congestive) heart failure: Secondary | ICD-10-CM

## 2019-05-19 DIAGNOSIS — E559 Vitamin D deficiency, unspecified: Secondary | ICD-10-CM

## 2019-05-19 DIAGNOSIS — Z23 Encounter for immunization: Secondary | ICD-10-CM | POA: Diagnosis not present

## 2019-05-19 DIAGNOSIS — I10 Essential (primary) hypertension: Secondary | ICD-10-CM

## 2019-05-19 DIAGNOSIS — E611 Iron deficiency: Secondary | ICD-10-CM

## 2019-05-19 DIAGNOSIS — E538 Deficiency of other specified B group vitamins: Secondary | ICD-10-CM

## 2019-05-19 DIAGNOSIS — Z0000 Encounter for general adult medical examination without abnormal findings: Secondary | ICD-10-CM

## 2019-05-19 DIAGNOSIS — R972 Elevated prostate specific antigen [PSA]: Secondary | ICD-10-CM

## 2019-05-19 DIAGNOSIS — E119 Type 2 diabetes mellitus without complications: Secondary | ICD-10-CM

## 2019-05-19 MED ORDER — TADALAFIL 10 MG PO TABS: 10.0000 mg | ORAL_TABLET | Freq: Every day | ORAL | 11 refills | Status: DC | PRN

## 2019-05-19 MED ORDER — PIOGLITAZONE HCL 45 MG PO TABS: 45.0000 mg | ORAL_TABLET | Freq: Every day | ORAL | 3 refills | Status: DC

## 2019-05-19 MED ORDER — DOXYCYCLINE HYCLATE 100 MG PO TABS: 100.0000 mg | ORAL_TABLET | Freq: Two times a day (BID) | ORAL | 0 refills | Status: DC

## 2019-05-19 NOTE — Progress Notes (Signed)
Subjective:    Patient ID: Keith [name], male    DOB: 1956-02-02, 63 y.o.   MRN: [id number]  HPI  Here for wellness and f/u;  Overall doing ok;  Pt denies Chest pain, worsening SOB, DOE, wheezing, orthopnea, PND, worsening LE edema, palpitations, dizziness or syncope.  Pt denies neurological change such as new headache, facial or extremity weakness.  Pt denies polydipsia, polyuria, or low sugar symptoms. Pt states overall good compliance with treatment and medications, good tolerability, and has been trying to follow appropriate diet.  Pt denies worsening depressive symptoms, suicidal ideation or panic. No fever, night sweats, wt loss, loss of appetite, or other constitutional symptoms.  Pt states good ability with ADL's, has low fall risk, home safety reviewed and adequate, no other significant changes in hearing or vision, and only occasionally active with exercise.Marland KitchenUnfortunately had to stop the trulicity due to cost and donut hole.   Also Denies urinary symptoms such as dysuria, frequency, urgency, flank pain, hematuria or n/v, fever, chills. Denies prostatism such as difficulty starting urination. Also c/o worsening ED symptoms in the past 6 mo, asking for PDE5 change as viagra leads to HA.   Past Medical History:  Diagnosis Date  . Anxiety   . Arthritis   . Cardiomyopathy    Echo (5/11) showed EF 30% with diffuse hypokinesis, normal wall thickness, mildly decreased RV systolic function. This may be a cardiomyopathy due to hemochromatosis versus tachy-mediated. Unable to get cardiac MRI due to claustrophobia.  . Cervical spine pain    Chronic  . Chest pain    Exercise/adenosine Myoview showed EF 50% (3/10). Inferior hypokinesis. Perfusion images, inferior scar and ischemia in the apical anterior septum and in teh inferior wall . Suggestive of a CD. LHC (3/10) showed only lu minal irregulatrities in the coronaries w/ EF 55%, suggesting that myoview was a false positive, likely from  diaphragmatic attnuation.  . Diabetes mellitus   . Diverticulosis   . Fatty liver   . GERD (gastroesophageal reflux disease)   . Heart murmur   . Hereditary hemochromatosis    Compound heterozygote. Pt gets periodic phlebotomies  . History of colonic polyps   . Hyperlipidemia   . Hypertension   . Low back pain    The pt is S/P spinal fusion surgeries  . Multifocal atrial tachycardia (HCC)    Wandering atrial pacemaker. Holter monior in 5/11 showed frequent runs of symtomatic MAT with heart rate around 100. Pt is now  on amiodarone to try to suppress MAT. PFTs (6/11): FVC 105%, FEV1 109%, TLC 112%, DLCO 87% (minimal obstructive effect). LFTs stable on amiodarone  . Peptic ulcer disease   . Thyroid nodule    Past Surgical History:  Procedure Laterality Date  . BACK SURGERY  1991   Lumbar  . INGUINAL HERNIA REPAIR    . SPINE SURGERY    . TONSILLECTOMY      reports that he quit smoking about 15 years ago. He has never used smokeless tobacco. He reports current alcohol use of about 1.0 standard drinks of alcohol per week. He reports current drug use. family history includes Alcohol abuse in his father; Colitis in his mother; Colon polyps in his mother; Diabetes in his mother, paternal grandfather, and paternal grandmother; Hypertension in his father; Multiple sclerosis in his mother and sister. Allergies  Allergen Reactions  . Lipitor [Atorvastatin]     myalgias  . Metoclopramide Hcl   . Penicillins   . Sertraline Hcl   .  Zocor [Simvastatin] Other (See Comments)    Myalgia    Current Outpatient Medications on File Prior to Visit  Medication Sig Dispense Refill  . ALPRAZolam (XANAX) 0.5 MG tablet 1/2 - 1 tab by mouth twice per day as needed 60 tablet 2  . aspirin 81 MG EC tablet Take 81 mg by mouth daily.      . Cholecalciferol (VITAMIN D-3 PO) Take 1 tablet by mouth daily. PATIENT TAKES ONCE TABLET BY MOUTH ONCE DAILY . PATIENT NOT SURE OF DOSEAGE.    Marland Kitchen diazepam (VALIUM) 5 MG  tablet Take 1 tablet (5 mg total) by mouth daily as needed for muscle spasms or sedation. 30 tablet 0  . Dulaglutide (TRULICITY) A999333 0000000 SOPN Inject 0.5 mLs into the skin once a week. 6 mL 3  . glipiZIDE (GLUCOTROL XL) 10 MG 24 hr tablet TAKE 1 TABLET DAILY WITH BREAKFAST 90 tablet 1  . glucosamine-chondroitin 500-400 MG tablet Take 1 tablet by mouth 3 (three) times daily.    Marland Kitchen ibuprofen (ADVIL,MOTRIN) 400 MG tablet Take 400 mg by mouth every 6 (six) hours as needed for headache or moderate pain. Reported on 10/30/2015    . levothyroxine (SYNTHROID) 25 MCG tablet TAKE 1 TABLET EVERY DAY 90 tablet 1  . losartan (COZAAR) 50 MG tablet TAKE 1 TABLET EVERY DAY 90 tablet 1  . metFORMIN (GLUCOPHAGE-XR) 500 MG 24 hr tablet TAKE 4 TABLETS EVERY DAY WITH BREAKFAST 360 tablet 1  . metoprolol succinate (TOPROL-XL) 100 MG 24 hr tablet TAKE 1 TABLET TWICE DAILY 180 tablet 1  . Omega-3 Fatty Acids (FISH OIL PO) Take 1 tablet by mouth daily.     . pantoprazole (PROTONIX) 40 MG tablet TAKE 1 TABLET EVERY DAY 90 tablet 1  . sildenafil (VIAGRA) 100 MG tablet Take 0.5-1 tablets (50-100 mg total) by mouth daily as needed for erectile dysfunction. 5 tablet 11  . triamcinolone (NASACORT AQ) 55 MCG/ACT AERO nasal inhaler Place 2 sprays into the nose daily. 1 Inhaler 12   No current facility-administered medications on file prior to visit.    Review of Systems Constitutional: Negative for other unusual diaphoresis, sweats, appetite or weight changes HENT: Negative for other worsening hearing loss, ear pain, facial swelling, mouth sores or neck stiffness.   Eyes: Negative for other worsening pain, redness or other visual disturbance.  Respiratory: Negative for other stridor or swelling Cardiovascular: Negative for other palpitations or other chest pain  Gastrointestinal: Negative for worsening diarrhea or loose stools, blood in stool, distention or other pain Genitourinary: Negative for hematuria, flank pain or  other change in urine volume.  Musculoskeletal: Negative for myalgias or other joint swelling.  Skin: Negative for other color change, or other wound or worsening drainage.  Neurological: Negative for other syncope or numbness. Hematological: Negative for other adenopathy or swelling Psychiatric/Behavioral: Negative for hallucinations, other worsening agitation, SI, self-injury, or new decreased concentration All other system neg per pt    Objective:   Physical Exam BP 136/84   Pulse 82   Temp 98.3 F (36.8 C) (Oral)   Ht 5\' 11"  (1.803 m)   Wt 227 lb (103 kg)   SpO2 97%   BMI 31.66 kg/m  VS noted,  Constitutional: Pt is oriented to person, place, and time. Appears well-developed and well-nourished, in no significant distress and comfortable Head: Normocephalic and atraumatic  Eyes: Conjunctivae and EOM are normal. Pupils are equal, round, and reactive to light Right Ear: External ear normal without discharge Left Ear: External  ear normal without discharge Nose: Nose without discharge or deformity Mouth/Throat: Oropharynx is without other ulcerations and moist  Neck: Normal range of motion. Neck supple. No JVD present. No tracheal deviation present or significant neck LA or mass Cardiovascular: Normal rate, regular rhythm, normal heart sounds and intact distal pulses.   Pulmonary/Chest: WOB normal and breath sounds without rales or wheezing  Abdominal: Soft. Bowel sounds are normal. NT. No HSM  Musculoskeletal: Normal range of motion. Exhibits no edema Lymphadenopathy: Has no other cervical adenopathy.  Neurological: Pt is alert and oriented to person, place, and time. Pt has normal reflexes. No cranial nerve deficit. Motor grossly intact, Gait intact Skin: Skin is warm and dry. No rash noted or new ulcerations Psychiatric:  Has normal mood and affect. Behavior is normal without agitation No other exam findings  Lab Results  Component Value Date   WBC 8.8 05/16/2019   HGB 16.3  05/16/2019   HCT 47.4 05/16/2019   PLT 195.0 05/16/2019   GLUCOSE 245 (H) 05/16/2019   CHOL 176 05/16/2019   TRIG 364.0 (H) 05/16/2019   HDL 27.40 (L) 05/16/2019   LDLDIRECT 86.0 05/16/2019   LDLCALC 92 11/12/2018   ALT 49 05/16/2019   AST 35 05/16/2019   NA 136 05/16/2019   K 4.2 05/16/2019   CL 98 05/16/2019   CREATININE 1.11 05/16/2019   BUN 13 05/16/2019   CO2 28 05/16/2019   TSH 4.51 (H) 05/16/2019   PSA 3.60 05/16/2019   INR 1.1 (H) 07/09/2016   HGBA1C 10.0 (H) 05/16/2019   MICROALBUR 1.0 05/16/2019      Assessment & Plan:

## 2019-05-19 NOTE — Patient Instructions (Addendum)
You had the flu shot and Tdap tetanus shots today  Please take all new medication as prescribed  - the generic actos 45 mg per day  Please take all new medication as prescribed - the doxycycline for 1 mo (antibiotic) , and the generic cialis 10 mg  Please return in 4 wks for LAB only for repeat PSA  Please continue all other medications as before, and refills have been done if requested.  Please have the pharmacy call with any other refills you may need.  Please continue your efforts at being more active, low cholesterol diet, and weight control.  You are otherwise up to date with prevention measures today.  Please keep your appointments with your specialists as you may have planned  Please return in 6 months, or sooner if needed, with Lab testing done 3-5 days before

## 2019-05-20 ENCOUNTER — Encounter: Payer: Self-pay | Admitting: Internal Medicine

## 2019-05-20 ENCOUNTER — Other Ambulatory Visit: Payer: Self-pay | Admitting: Internal Medicine

## 2019-05-20 DIAGNOSIS — N529 Male erectile dysfunction, unspecified: Secondary | ICD-10-CM | POA: Insufficient documentation

## 2019-05-20 MED ORDER — SILDENAFIL CITRATE 100 MG PO TABS: 50.0000 mg | ORAL_TABLET | Freq: Every day | ORAL | 11 refills | Status: DC | PRN

## 2019-05-20 NOTE — Assessment & Plan Note (Signed)
stable overall by history and exam, recent data reviewed with pt, and pt to continue medical treatment as before,  to f/u any worsening symptoms or concerns  

## 2019-05-20 NOTE — Assessment & Plan Note (Signed)

## 2019-05-20 NOTE — Assessment & Plan Note (Addendum)
Surprise finding, for doxycycline x 4 wks, then repeat PSA  In addition to the time spent performing CPE, I spent an additional 25 minutes face to face,in which greater than 50% of this time was spent in counseling and coordination of care for patient's acute illness as documented, including the differential dx, treatment, further evaluation and other management of increased psa velocity, ED, HTN, DM, CHF

## 2019-05-20 NOTE — Assessment & Plan Note (Signed)
Persistent and now worsening, for change viagra to generic cialis prn,  to f/u any worsening symptoms or concerns

## 2019-05-20 NOTE — Assessment & Plan Note (Signed)
Severe uncontrolled, pt does want any type of injectable med, but is willing to try actos again, for actos 45 mg daily, cont other meds, cont diet and wt control,  to f/u any worsening symptoms or concerns

## 2019-05-20 NOTE — Telephone Encounter (Signed)
Patient called stating he picked up his medication tadalafil (CIALIS) 10 MG and the pharmacy only gave him 10 tablet. Patient is now requesting 30 tablets due to his coupon he has for goodRX.   Patient call back [place], [place]  [phone] PHONE [phone] FAX

## 2019-05-24 NOTE — Telephone Encounter (Signed)
Pt stated that he asked for a request for   tadalafil (CIALIS) 10 MG tablet  And not for Sildenafil. Pt stated when he went to the pharmacy they advised him of  4 tablet Rx with 29 refills. Pt is confused about the RX order and just wants an Rx sent to the pharmacy for Tadalafil 10 mg / 30 tablets so that he can use his goodrx coupon please advise

## 2019-05-25 MED ORDER — TADALAFIL 10 MG PO TABS: 10.0000 mg | ORAL_TABLET | Freq: Every day | ORAL | 11 refills | Status: DC | PRN

## 2019-05-25 NOTE — Addendum Note (Signed)
Addended by: Biagio Borg on: 05/25/2019 12:47 PM   Modules accepted: Orders

## 2019-05-25 NOTE — Telephone Encounter (Signed)
cialis rx done erx

## 2019-05-26 ENCOUNTER — Ambulatory Visit: Payer: Self-pay

## 2019-05-26 MED ORDER — TADALAFIL 10 MG PO TABS: 10.0000 mg | ORAL_TABLET | Freq: Every day | ORAL | 11 refills | Status: DC | PRN

## 2019-05-26 MED ORDER — CIPROFLOXACIN HCL 500 MG PO TABS: 500.0000 mg | ORAL_TABLET | Freq: Two times a day (BID) | ORAL | 0 refills | Status: AC

## 2019-05-26 NOTE — Addendum Note (Signed)
Addended by: Biagio Borg on: 05/26/2019 12:30 PM   Modules accepted: Orders

## 2019-05-26 NOTE — Telephone Encounter (Signed)
Done erx 

## 2019-05-26 NOTE — Telephone Encounter (Signed)
Ok to change the doxy to cipro  - done  erx

## 2019-05-26 NOTE — Telephone Encounter (Signed)
Pt called and stated that he would 30 tablets at one because of Good Rx coupon. Please advise   Cb#[phone]

## 2019-05-26 NOTE — Telephone Encounter (Signed)
Pt. Reports he started feeling bad as soon as he started taking the Doxycycline. He states about 20 minutes after he takes it, he feels "woozy, nauseous." Had some blurred vision and pain in his jaw Sunday night, none today. Has noticed a weaker urinary stream as well. These symptoms come in waves. States he has not started the Actos yet." I wanted to get this straight before I start another new medicine." Please advise pt.   Answer Assessment - Initial Assessment Questions 1.   NAME of MEDICATION: "What medicine are you calling about?"     Doxycycline 2.   QUESTION: "What is your question?"     Having nausea, woozy, had pain in left jaw Sunday night 3.   PRESCRIBING HCP: "Who prescribed it?" Reason: if prescribed by specialist, call should be referred to that group.     Dr. Jenny Reichmann 4. SYMPTOMS: "Do you have any symptoms?"     Yes 5. SEVERITY: If symptoms are present, ask "Are they mild, moderate or severe?"     Moderate 6.  PREGNANCY:  "Is there any chance that you are pregnant?" "When was your last menstrual period?"     n/a  Protocols used: MEDICATION QUESTION CALL-A-AH

## 2019-05-26 NOTE — Addendum Note (Signed)
Addended by: Biagio Borg on: 05/26/2019 12:42 PM   Modules accepted: Orders

## 2019-05-30 ENCOUNTER — Telehealth: Payer: Self-pay | Admitting: Internal Medicine

## 2019-05-30 NOTE — Telephone Encounter (Signed)
Sounds correct to me, since the purpose was to take for 1 mo, then check the PSA again after.

## 2019-05-30 NOTE — Telephone Encounter (Signed)
Patient would like to know how he is suppose to take it. The bottle says to take 1 tab by mouth twice a day but it was sent in with a quantity of 60. Please advise.

## 2019-05-30 NOTE — Telephone Encounter (Signed)
Copied from [place] [phone]. Topic: General - Other >> May 30, 2019  4:13 PM Keene Breath wrote: Reason for CRM: Patient called to ask the nurse for some clarification on the dosage of his medication for ciprofloxacin (CIPRO) 500 MG tablet.  Patient is not sure how much he is supposed to take.  Please call patient to clarify.  CB# [phone]

## 2019-05-31 NOTE — Telephone Encounter (Signed)
Attempted to contact, no VM set up to leave a msg.

## 2019-05-31 NOTE — Telephone Encounter (Signed)
The directions says for him to take it 10 days. Should he omit those directions?

## 2019-05-31 NOTE — Telephone Encounter (Signed)
Sorry for the confusion, this was a computer error that was not changed automatically when I changed the quantity to 60; ok for take for 1 mo

## 2019-07-01 ENCOUNTER — Other Ambulatory Visit (INDEPENDENT_AMBULATORY_CARE_PROVIDER_SITE_OTHER): Payer: Medicare HMO

## 2019-07-01 ENCOUNTER — Encounter: Payer: Self-pay | Admitting: Internal Medicine

## 2019-07-01 DIAGNOSIS — E538 Deficiency of other specified B group vitamins: Secondary | ICD-10-CM

## 2019-07-01 DIAGNOSIS — R972 Elevated prostate specific antigen [PSA]: Secondary | ICD-10-CM | POA: Diagnosis not present

## 2019-07-01 DIAGNOSIS — E611 Iron deficiency: Secondary | ICD-10-CM

## 2019-07-01 DIAGNOSIS — E559 Vitamin D deficiency, unspecified: Secondary | ICD-10-CM

## 2019-07-01 LAB — IBC PANEL
Iron: 99 ug/dL (ref 42–165)
Saturation Ratios: 32.7 % (ref 20.0–50.0)
Transferrin: 216 mg/dL (ref 212.0–360.0)

## 2019-07-01 LAB — PSA: PSA: 1.26 ng/mL (ref 0.10–4.00)

## 2019-07-01 LAB — VITAMIN D 25 HYDROXY (VIT D DEFICIENCY, FRACTURES): VITD: 32.14 ng/mL (ref 30.00–100.00)

## 2019-07-01 LAB — VITAMIN B12: Vitamin B-12: 358 pg/mL (ref 211–911)

## 2019-07-06 ENCOUNTER — Telehealth: Payer: Self-pay | Admitting: *Deleted

## 2019-07-06 NOTE — Telephone Encounter (Signed)
I called pt- he wanted to know about his 07/01/19 labs. I informed him his labs were normal.

## 2019-07-06 NOTE — Telephone Encounter (Signed)
Copied from [place] [id number]. Topic: Quick Communication - Lab Results (Clinic Use ONLY) >> Jul 06, 2019  9:50 AM Lennox Solders wrote: Pt would like blood work results from 07-01-2019

## 2019-07-09 ENCOUNTER — Other Ambulatory Visit: Payer: Self-pay | Admitting: Internal Medicine

## 2019-07-12 ENCOUNTER — Other Ambulatory Visit: Payer: Self-pay | Admitting: Internal Medicine

## 2019-07-18 ENCOUNTER — Telehealth: Payer: Self-pay | Admitting: *Deleted

## 2019-07-18 DIAGNOSIS — R3912 Poor urinary stream: Secondary | ICD-10-CM

## 2019-07-18 NOTE — Telephone Encounter (Signed)
Referral done

## 2019-07-18 NOTE — Telephone Encounter (Signed)
Copied from [place] [phone]. Topic: Referral - Request for Referral >> Jul 18, 2019 11:04 AM Ivar Drape wrote: Has patient seen PCP for this complaint? YES *If NO, is insurance requiring patient see PCP for this issue before PCP can refer them? Referral for which specialty:  Urologist Preferred provider/office: Urologist across the street from the practice.  Pt said he saw them before Reason for referral: Pt stated he is experiencing a very weak stream when urinating

## 2019-08-26 DIAGNOSIS — N401 Enlarged prostate with lower urinary tract symptoms: Secondary | ICD-10-CM | POA: Diagnosis not present

## 2019-08-26 DIAGNOSIS — R3912 Poor urinary stream: Secondary | ICD-10-CM | POA: Diagnosis not present

## 2019-08-26 DIAGNOSIS — N281 Cyst of kidney, acquired: Secondary | ICD-10-CM | POA: Diagnosis not present

## 2019-11-15 ENCOUNTER — Other Ambulatory Visit (INDEPENDENT_AMBULATORY_CARE_PROVIDER_SITE_OTHER): Payer: Medicare HMO

## 2019-11-15 DIAGNOSIS — E119 Type 2 diabetes mellitus without complications: Secondary | ICD-10-CM | POA: Diagnosis not present

## 2019-11-15 DIAGNOSIS — R972 Elevated prostate specific antigen [PSA]: Secondary | ICD-10-CM | POA: Diagnosis not present

## 2019-11-15 DIAGNOSIS — N401 Enlarged prostate with lower urinary tract symptoms: Secondary | ICD-10-CM | POA: Diagnosis not present

## 2019-11-15 LAB — LDL CHOLESTEROL, DIRECT: Direct LDL: 127 mg/dL

## 2019-11-15 LAB — BASIC METABOLIC PANEL
BUN: 15 mg/dL (ref 6–23)
CO2: 27 mEq/L (ref 19–32)
Calcium: 10 mg/dL (ref 8.4–10.5)
Chloride: 101 mEq/L (ref 96–112)
Creatinine, Ser: 1.18 mg/dL (ref 0.40–1.50)
GFR: 62.42 mL/min (ref >60.00)
Glucose, Bld: 172 mg/dL — ABNORMAL HIGH (ref 70–99)
Potassium: 4.2 mEq/L (ref 3.5–5.1)
Sodium: 138 mEq/L (ref 135–145)

## 2019-11-15 LAB — HEPATIC FUNCTION PANEL
ALT: 23 U/L (ref 0–53)
AST: 20 U/L (ref 0–37)
Albumin: 4.1 g/dL (ref 3.5–5.2)
Alkaline Phosphatase: 65 U/L (ref 39–117)
Bilirubin, Direct: 0.1 mg/dL (ref 0.0–0.3)
Total Bilirubin: 0.6 mg/dL (ref 0.2–1.2)
Total Protein: 7.2 g/dL (ref 6.0–8.3)

## 2019-11-15 LAB — LIPID PANEL
Cholesterol: 199 mg/dL (ref 0–200)
HDL: 28.1 mg/dL — ABNORMAL LOW (ref >39.00)
NonHDL: 170.88
Total CHOL/HDL Ratio: 7
Triglycerides: 337 mg/dL — ABNORMAL HIGH (ref 0.0–149.0)
VLDL: 67.4 mg/dL — ABNORMAL HIGH (ref 0.0–40.0)

## 2019-11-15 LAB — HEMOGLOBIN A1C: Hgb A1c MFr Bld: 8.5 % — ABNORMAL HIGH (ref 4.6–6.5)

## 2019-11-15 LAB — PSA: PSA: 1.21 ng/mL (ref 0.10–4.00)

## 2019-11-17 ENCOUNTER — Encounter: Payer: Self-pay | Admitting: Internal Medicine

## 2019-11-17 ENCOUNTER — Other Ambulatory Visit: Payer: Self-pay

## 2019-11-17 ENCOUNTER — Ambulatory Visit (INDEPENDENT_AMBULATORY_CARE_PROVIDER_SITE_OTHER): Payer: Medicare HMO | Admitting: Internal Medicine

## 2019-11-17 VITALS — BP 132/84 | HR 70 | Temp 98.7°F | Ht 71.0 in | Wt 238.8 lb

## 2019-11-17 DIAGNOSIS — E119 Type 2 diabetes mellitus without complications: Secondary | ICD-10-CM

## 2019-11-17 DIAGNOSIS — Z0000 Encounter for general adult medical examination without abnormal findings: Secondary | ICD-10-CM

## 2019-11-17 DIAGNOSIS — E785 Hyperlipidemia, unspecified: Secondary | ICD-10-CM

## 2019-11-17 MED ORDER — FENOFIBRATE 145 MG PO TABS: 145.0000 mg | ORAL_TABLET | Freq: Every day | ORAL | 3 refills | Status: DC

## 2019-11-17 NOTE — Patient Instructions (Signed)
Please take all new medication as prescribed - the fenofibrate for cholesterol  Please continue all other medications as before, and refills have been done if requested.  Please have the pharmacy call with any other refills you may need.  Please continue your efforts at being more active, low cholesterol diet, and weight control.  You are otherwise up to date with prevention measures today.  Please keep your appointments with your specialists as you may have planned  Please make an Appointment to return in 6 months, or sooner if needed, also with Lab Appointment for testing done 3-5 days before at the [facility] (so this is for TWO appointments - please see the scheduling desk as you leave)

## 2019-11-17 NOTE — Progress Notes (Signed)
Subjective:    Patient ID: Keith [name], male    DOB: August 01, 1956, 64 y.o.   MRN: [id number]  HPI  Here for wellness and f/u;  Overall doing ok;  Pt denies Chest pain, worsening SOB, DOE, wheezing, orthopnea, PND, worsening LE edema, palpitations, dizziness or syncope.  Pt denies neurological change such as new headache, facial or extremity weakness.  Pt denies polydipsia, polyuria, or low sugar symptoms. Pt states overall good compliance with treatment and medications, good tolerability, and has been trying to follow appropriate diet.  Pt denies worsening depressive symptoms, suicidal ideation or panic. No fever, night sweats, wt loss, loss of appetite, or other constitutional symptoms.  Pt states good ability with ADL's, has low fall risk, home safety reviewed and adequate, no other significant changes in hearing or vision, and only occasionally active with exercise. Plans to call eye doctor soon.  Has been statin intolerant. Past Medical History:  Diagnosis Date  . Anxiety   . Arthritis   . Cardiomyopathy    Echo (5/11) showed EF 30% with diffuse hypokinesis, normal wall thickness, mildly decreased RV systolic function. This may be a cardiomyopathy due to hemochromatosis versus tachy-mediated. Unable to get cardiac MRI due to claustrophobia.  . Cervical spine pain    Chronic  . Chest pain    Exercise/adenosine Myoview showed EF 50% (3/10). Inferior hypokinesis. Perfusion images, inferior scar and ischemia in the apical anterior septum and in teh inferior wall . Suggestive of a CD. LHC (3/10) showed only lu minal irregulatrities in the coronaries w/ EF 55%, suggesting that myoview was a false positive, likely from diaphragmatic attnuation.  . Diabetes mellitus   . Diverticulosis   . Fatty liver   . GERD (gastroesophageal reflux disease)   . Heart murmur   . Hereditary hemochromatosis    Compound heterozygote. Pt gets periodic phlebotomies  . History of colonic polyps   . Hyperlipidemia    . Hypertension   . Low back pain    The pt is S/P spinal fusion surgeries  . Multifocal atrial tachycardia (HCC)    Wandering atrial pacemaker. Holter monior in 5/11 showed frequent runs of symtomatic MAT with heart rate around 100. Pt is now  on amiodarone to try to suppress MAT. PFTs (6/11): FVC 105%, FEV1 109%, TLC 112%, DLCO 87% (minimal obstructive effect). LFTs stable on amiodarone  . Peptic ulcer disease   . Thyroid nodule    Past Surgical History:  Procedure Laterality Date  . BACK SURGERY  1991   Lumbar  . INGUINAL HERNIA REPAIR    . SPINE SURGERY    . TONSILLECTOMY      reports that he quit smoking about 16 years ago. He has never used smokeless tobacco. He reports current alcohol use of about 1.0 standard drinks of alcohol per week. He reports current drug use. family history includes Alcohol abuse in his father; Colitis in his mother; Colon polyps in his mother; Diabetes in his mother, paternal grandfather, and paternal grandmother; Hypertension in his father; Multiple sclerosis in his mother and sister. Allergies  Allergen Reactions  . Lipitor [Atorvastatin]     myalgias  . Metoclopramide Hcl   . Penicillins   . Sertraline Hcl   . Zocor [Simvastatin] Other (See Comments)    Myalgia    Current Outpatient Medications on File Prior to Visit  Medication Sig Dispense Refill  . ALPRAZolam (XANAX) 0.5 MG tablet 1/2 - 1 tab by mouth twice per day as needed 60 tablet  2  . aspirin 81 MG EC tablet Take 81 mg by mouth daily.      . Cholecalciferol (VITAMIN D-3 PO) Take 1 tablet by mouth daily. PATIENT TAKES ONCE TABLET BY MOUTH ONCE DAILY . PATIENT NOT SURE OF DOSEAGE.    Marland Kitchen diazepam (VALIUM) 5 MG tablet Take 1 tablet (5 mg total) by mouth daily as needed for muscle spasms or sedation. 30 tablet 0  . glipiZIDE (GLUCOTROL XL) 10 MG 24 hr tablet TAKE 1 TABLET DAILY WITH BREAKFAST 90 tablet 1  . glucosamine-chondroitin 500-400 MG tablet Take 1 tablet by mouth 3 (three) times  daily.    Marland Kitchen ibuprofen (ADVIL,MOTRIN) 400 MG tablet Take 400 mg by mouth every 6 (six) hours as needed for headache or moderate pain. Reported on 10/30/2015    . levothyroxine (SYNTHROID) 25 MCG tablet TAKE 1 TABLET EVERY DAY 90 tablet 1  . losartan (COZAAR) 50 MG tablet TAKE 1 TABLET EVERY DAY 90 tablet 1  . metFORMIN (GLUCOPHAGE-XR) 500 MG 24 hr tablet TAKE 4 TABLETS EVERY DAY WITH BREAKFAST 360 tablet 1  . metoprolol succinate (TOPROL-XL) 100 MG 24 hr tablet TAKE 1 TABLET TWICE DAILY 180 tablet 1  . Omega-3 Fatty Acids (FISH OIL PO) Take 1 tablet by mouth daily.     . pantoprazole (PROTONIX) 40 MG tablet TAKE 1 TABLET EVERY DAY 90 tablet 1  . pioglitazone (ACTOS) 45 MG tablet Take 1 tablet (45 mg total) by mouth daily. 90 tablet 3  . tadalafil (CIALIS) 10 MG tablet Take 1 tablet (10 mg total) by mouth daily as needed for erectile dysfunction. 30 tablet 11  . Dulaglutide (TRULICITY) A999333 0000000 SOPN Inject 0.5 mLs into the skin once a week. (Patient not taking: Reported on 11/17/2019) 6 mL 3   No current facility-administered medications on file prior to visit.   Review of Systems All otherwise neg per pt     Objective:   Physical Exam BP 132/84   Pulse 70   Temp 98.7 F (37.1 C)   Ht 5\' 11"  (1.803 m)   Wt 238 lb 12.8 oz (108.3 kg)   SpO2 99%   BMI 33.31 kg/m  VS noted,  Constitutional: Pt appears in NAD HENT: Head: NCAT.  Right Ear: External ear normal.  Left Ear: External ear normal.  Eyes: . Pupils are equal, round, and reactive to light. Conjunctivae and EOM are normal Nose: without d/c or deformity Neck: Neck supple. Gross normal ROM Cardiovascular: Normal rate and regular rhythm.   Pulmonary/Chest: Effort normal and breath sounds without rales or wheezing.  Abd:  Soft, NT, ND, + BS, no organomegaly Neurological: Pt is alert. At baseline orientation, motor grossly intact Skin: Skin is warm. No rashes, other new lesions, no LE edema Psychiatric: Pt behavior is normal  without agitation  All otherwise neg per pt  Lab Results  Component Value Date   WBC 8.8 05/16/2019   HGB 16.3 05/16/2019   HCT 47.4 05/16/2019   PLT 195.0 05/16/2019   GLUCOSE 172 (H) 11/15/2019   CHOL 199 11/15/2019   TRIG 337.0 (H) 11/15/2019   HDL 28.10 (L) 11/15/2019   LDLDIRECT 127.0 11/15/2019   LDLCALC 92 11/12/2018   ALT 23 11/15/2019   AST 20 11/15/2019   NA 138 11/15/2019   K 4.2 11/15/2019   CL 101 11/15/2019   CREATININE 1.18 11/15/2019   BUN 15 11/15/2019   CO2 27 11/15/2019   TSH 4.51 (H) 05/16/2019   PSA 1.21 11/15/2019  INR 1.1 (H) 07/09/2016   HGBA1C 8.5 (H) 11/15/2019   MICROALBUR 1.0 05/16/2019      Assessment & Plan:

## 2019-11-19 ENCOUNTER — Encounter: Payer: Self-pay | Admitting: Internal Medicine

## 2019-11-19 NOTE — Assessment & Plan Note (Signed)
stable overall by history and exam, recent data reviewed with pt, and pt to continue medical treatment as before,  to f/u any worsening symptoms or concerns  

## 2019-11-19 NOTE — Assessment & Plan Note (Signed)

## 2019-11-19 NOTE — Assessment & Plan Note (Signed)
To add fenofibrate 145 qd

## 2019-11-22 DIAGNOSIS — R3912 Poor urinary stream: Secondary | ICD-10-CM | POA: Diagnosis not present

## 2019-11-22 DIAGNOSIS — N401 Enlarged prostate with lower urinary tract symptoms: Secondary | ICD-10-CM | POA: Diagnosis not present

## 2019-11-22 DIAGNOSIS — N281 Cyst of kidney, acquired: Secondary | ICD-10-CM | POA: Diagnosis not present

## 2019-11-22 DIAGNOSIS — N5201 Erectile dysfunction due to arterial insufficiency: Secondary | ICD-10-CM | POA: Diagnosis not present

## 2019-11-26 ENCOUNTER — Other Ambulatory Visit: Payer: Self-pay | Admitting: Internal Medicine

## 2019-11-26 NOTE — Telephone Encounter (Signed)
Please refill as per office routine med refill policy (all routine meds refilled for 3 mo or monthly per pt preference up to one year from last visit, then month to month grace period for 3 mo, then further med refills will have to be denied)  

## 2020-04-13 ENCOUNTER — Other Ambulatory Visit: Payer: Self-pay | Admitting: Internal Medicine

## 2020-04-13 NOTE — Telephone Encounter (Signed)
Please refill as per office routine med refill policy (all routine meds refilled for 3 mo or monthly per pt preference up to one year from last visit, then month to month grace period for 3 mo, then further med refills will have to be denied)  

## 2020-05-14 DIAGNOSIS — H524 Presbyopia: Secondary | ICD-10-CM | POA: Diagnosis not present

## 2020-05-14 DIAGNOSIS — H52223 Regular astigmatism, bilateral: Secondary | ICD-10-CM | POA: Diagnosis not present

## 2020-05-15 ENCOUNTER — Other Ambulatory Visit (INDEPENDENT_AMBULATORY_CARE_PROVIDER_SITE_OTHER): Payer: Medicare HMO

## 2020-05-15 DIAGNOSIS — E119 Type 2 diabetes mellitus without complications: Secondary | ICD-10-CM

## 2020-05-15 LAB — BASIC METABOLIC PANEL
BUN: 12 mg/dL (ref 6–23)
CO2: 27 mEq/L (ref 19–32)
Calcium: 9.4 mg/dL (ref 8.4–10.5)
Chloride: 103 mEq/L (ref 96–112)
Creatinine, Ser: 1.24 mg/dL (ref 0.40–1.50)
GFR: 58.86 mL/min — ABNORMAL LOW (ref >60.00)
Glucose, Bld: 142 mg/dL — ABNORMAL HIGH (ref 70–99)
Potassium: 4.1 mEq/L (ref 3.5–5.1)
Sodium: 138 mEq/L (ref 135–145)

## 2020-05-15 LAB — HEPATIC FUNCTION PANEL
ALT: 21 U/L (ref 0–53)
AST: 19 U/L (ref 0–37)
Albumin: 4.3 g/dL (ref 3.5–5.2)
Alkaline Phosphatase: 43 U/L (ref 39–117)
Bilirubin, Direct: 0.1 mg/dL (ref 0.0–0.3)
Total Bilirubin: 0.6 mg/dL (ref 0.2–1.2)
Total Protein: 7 g/dL (ref 6.0–8.3)

## 2020-05-15 LAB — LIPID PANEL
Cholesterol: 176 mg/dL (ref 0–200)
HDL: 30.4 mg/dL — ABNORMAL LOW (ref >39.00)
LDL Cholesterol: 118 mg/dL — ABNORMAL HIGH (ref 0–99)
NonHDL: 146.02
Total CHOL/HDL Ratio: 6
Triglycerides: 139 mg/dL (ref 0.0–149.0)
VLDL: 27.8 mg/dL (ref 0.0–40.0)

## 2020-05-15 LAB — HEMOGLOBIN A1C: Hgb A1c MFr Bld: 7.6 % — ABNORMAL HIGH (ref 4.6–6.5)

## 2020-05-22 ENCOUNTER — Ambulatory Visit (INDEPENDENT_AMBULATORY_CARE_PROVIDER_SITE_OTHER): Payer: Medicare HMO | Admitting: Internal Medicine

## 2020-05-22 ENCOUNTER — Encounter: Payer: Self-pay | Admitting: Internal Medicine

## 2020-05-22 ENCOUNTER — Other Ambulatory Visit: Payer: Self-pay

## 2020-05-22 ENCOUNTER — Ambulatory Visit (INDEPENDENT_AMBULATORY_CARE_PROVIDER_SITE_OTHER): Payer: Medicare HMO

## 2020-05-22 VITALS — BP 150/92 | HR 59 | Temp 98.1°F | Ht 70.5 in | Wt 238.0 lb

## 2020-05-22 VITALS — BP 132/82 | HR 85 | Temp 98.2°F | Resp 16 | Ht 70.5 in | Wt 240.6 lb

## 2020-05-22 DIAGNOSIS — E785 Hyperlipidemia, unspecified: Secondary | ICD-10-CM

## 2020-05-22 DIAGNOSIS — Z0000 Encounter for general adult medical examination without abnormal findings: Secondary | ICD-10-CM

## 2020-05-22 DIAGNOSIS — I10 Essential (primary) hypertension: Secondary | ICD-10-CM | POA: Diagnosis not present

## 2020-05-22 DIAGNOSIS — E119 Type 2 diabetes mellitus without complications: Secondary | ICD-10-CM

## 2020-05-22 DIAGNOSIS — E559 Vitamin D deficiency, unspecified: Secondary | ICD-10-CM

## 2020-05-22 DIAGNOSIS — E538 Deficiency of other specified B group vitamins: Secondary | ICD-10-CM | POA: Diagnosis not present

## 2020-05-22 NOTE — Patient Instructions (Addendum)
Please continue to monitor your BP at home on a regular basis, with the goal being to be less than 14090, or better would be less than 130/80.  Please remember to see your eye doctor for your yearly exam  Please continue all other medications as before, and refills have been done if requested.  Please have the pharmacy call with any other refills you may need.  Please continue your efforts at being more active, low cholesterol diet, and weight control.  You are otherwise up to date with prevention measures today.  Please keep your appointments with your specialists as you may have planned  Please make an Appointment to return in 6 months, or sooner if needed, also with Lab Appointment for testing done 3-5 days before at the [facility] (so this is for TWO appointments - please see the scheduling desk as you leave)

## 2020-05-22 NOTE — Progress Notes (Signed)
Subjective:    Patient ID: Keith [name], male    DOB: [birth year], 64 y.o.   MRN: [id number]  HPI  Here to f/u; overall doing ok,  Pt denies chest pain, increasing sob or doe, wheezing, orthopnea, PND, increased LE swelling, palpitations, dizziness or syncope.  Pt denies new neurological symptoms such as new headache, or facial or extremity weakness or numbness.  Pt denies polydipsia, polyuria, or low sugar episode.  Pt states overall good compliance with meds, mostly trying to follow appropriate diet, with wt overall stable,  but little exercise however.  bP at home < 140/90.   BP Readings from Last 3 Encounters:  05/22/20 (!) 150/92  05/22/20 132/82  11/17/19 132/84   Past Medical History:  Diagnosis Date  . Anxiety   . Arthritis   . Cardiomyopathy    Echo (5/11) showed EF 30% with diffuse hypokinesis, normal wall thickness, mildly decreased RV systolic function. This may be a cardiomyopathy due to hemochromatosis versus tachy-mediated. Unable to get cardiac MRI due to claustrophobia.  . Cervical spine pain    Chronic  . Chest pain    Exercise/adenosine Myoview showed EF 50% (3/10). Inferior hypokinesis. Perfusion images, inferior scar and ischemia in the apical anterior septum and in teh inferior wall . Suggestive of a CD. LHC (3/10) showed only lu minal irregulatrities in the coronaries w/ EF 55%, suggesting that myoview was a false positive, likely from diaphragmatic attnuation.  . Diabetes mellitus   . Diverticulosis   . Fatty liver   . GERD (gastroesophageal reflux disease)   . Heart murmur   . Hereditary hemochromatosis    Compound heterozygote. Pt gets periodic phlebotomies  . History of colonic polyps   . Hyperlipidemia   . Hypertension   . Low back pain    The pt is S/P spinal fusion surgeries  . Multifocal atrial tachycardia (HCC)    Wandering atrial pacemaker. Holter monior in 5/11 showed frequent runs of symtomatic MAT with heart rate around 100. Pt is now  on  amiodarone to try to suppress MAT. PFTs (6/11): FVC 105%, FEV1 109%, TLC 112%, DLCO 87% (minimal obstructive effect). LFTs stable on amiodarone  . Peptic ulcer disease   . Thyroid nodule    Past Surgical History:  Procedure Laterality Date  . BACK SURGERY  1991   Lumbar  . INGUINAL HERNIA REPAIR    . SPINE SURGERY    . TONSILLECTOMY      reports that he quit smoking about 16 years ago. He has never used smokeless tobacco. He reports current alcohol use of about 1.0 standard drink of alcohol per week. He reports current drug use. family history includes Alcohol abuse in his father; Colitis in his mother; Colon polyps in his mother; Diabetes in his mother, paternal grandfather, and paternal grandmother; Hypertension in his father; Multiple sclerosis in his mother and sister. Allergies  Allergen Reactions  . Lipitor [Atorvastatin]     myalgias  . Metoclopramide Hcl   . Penicillins   . Sertraline Hcl   . Zocor [Simvastatin] Other (See Comments)    Myalgia    Current Outpatient Medications on File Prior to Visit  Medication Sig Dispense Refill  . ALPRAZolam (XANAX) 0.5 MG tablet 1/2 - 1 tab by mouth twice per day as needed 60 tablet 2  . aspirin 81 MG EC tablet Take 81 mg by mouth daily.      . Cholecalciferol (VITAMIN D-3 PO) Take 1 tablet by mouth daily. PATIENT TAKES  ONCE TABLET BY MOUTH ONCE DAILY . PATIENT NOT SURE OF DOSEAGE.    Marland Kitchen diazepam (VALIUM) 5 MG tablet Take 1 tablet (5 mg total) by mouth daily as needed for muscle spasms or sedation. 30 tablet 0  . Dulaglutide (TRULICITY) 1.5 WU/1.3KG SOPN INJECT  1.5MG  (0.5  MLS)  INTO  THE  SKIN ONE TIME WEEKLY    . fenofibrate (TRICOR) 145 MG tablet Take 1 tablet (145 mg total) by mouth daily. 90 tablet 3  . glipiZIDE (GLUCOTROL XL) 10 MG 24 hr tablet Take 1 tablet (10 mg total) by mouth daily with breakfast. Annual appt due in March must see provider for future refills 90 tablet 1  . glucosamine-chondroitin 500-400 MG tablet Take 1  tablet by mouth 3 (three) times daily.    Marland Kitchen ibuprofen (ADVIL,MOTRIN) 400 MG tablet Take 400 mg by mouth every 6 (six) hours as needed for headache or moderate pain. Reported on 10/30/2015    . levothyroxine (SYNTHROID) 25 MCG tablet TAKE 1 TABLET EVERY DAY 90 tablet 1  . losartan (COZAAR) 50 MG tablet Take 1 tablet (50 mg total) by mouth daily. Annual appt due in March must see provider for future refills 90 tablet 1  . metFORMIN (GLUCOPHAGE-XR) 500 MG 24 hr tablet Take 4 tablets (2,000 mg total) by mouth daily with breakfast. TAKE 4 TABLETS EVERY DAY  WITH  BREAKFAST 360 tablet 1  . metoprolol succinate (TOPROL-XL) 100 MG 24 hr tablet Take 1 tablet (100 mg total) by mouth 2 (two) times daily. Annual appt due in March must see provider for future refills 180 tablet 1  . Omega-3 Fatty Acids (FISH OIL PO) Take 1 tablet by mouth daily.     . pantoprazole (PROTONIX) 40 MG tablet Take 1 tablet (40 mg total) by mouth daily. Annual appt due in March must see provider for future refills 90 tablet 1  . pioglitazone (ACTOS) 45 MG tablet Take 1 tablet (45 mg total) by mouth daily. Annual appt due in March must see provider for future refills 90 tablet 1  . tadalafil (CIALIS) 10 MG tablet Take 1 tablet (10 mg total) by mouth daily as needed for erectile dysfunction. 30 tablet 11   No current facility-administered medications on file prior to visit.   Review of Systems All otherwise neg per pt    Objective:   Physical Exam BP (!) 150/92 (BP Location: Left Arm, Patient Position: Sitting, Cuff Size: Large)   Pulse (!) 59   Temp 98.1 F (36.7 C) (Oral)   Ht 5' 10.5" (1.791 m)   Wt 238 lb (108 kg)   SpO2 96%   BMI 33.67 kg/m  VS noted,  Constitutional: Pt appears in NAD HENT: Head: NCAT.  Right Ear: External ear normal.  Left Ear: External ear normal.  Eyes: . Pupils are equal, round, and reactive to light. Conjunctivae and EOM are normal Nose: without d/c or deformity Neck: Neck supple. Gross normal  ROM Cardiovascular: Normal rate and regular rhythm.   Pulmonary/Chest: Effort normal and breath sounds without rales or wheezing.  Abd:  Soft, NT, ND, + BS, no organomegaly Neurological: Pt is alert. At baseline orientation, motor grossly intact Skin: Skin is warm. No rashes, other new lesions, no LE edema Psychiatric: Pt behavior is normal without agitation  All otherwise neg per pt Lab Results  Component Value Date   WBC 8.8 05/16/2019   HGB 16.3 05/16/2019   HCT 47.4 05/16/2019   PLT 195.0 05/16/2019   GLUCOSE  142 (H) 05/15/2020   CHOL 176 05/15/2020   TRIG 139.0 05/15/2020   HDL 30.40 (L) 05/15/2020   LDLDIRECT 127.0 11/15/2019   LDLCALC 118 (H) 05/15/2020   ALT 21 05/15/2020   AST 19 05/15/2020   NA 138 05/15/2020   K 4.1 05/15/2020   CL 103 05/15/2020   CREATININE 1.24 05/15/2020   BUN 12 05/15/2020   CO2 27 05/15/2020   TSH 4.51 (H) 05/16/2019   PSA 1.21 11/15/2019   INR 1.1 (H) 07/09/2016   HGBA1C 7.6 (H) 05/15/2020   MICROALBUR 1.0 05/16/2019      Assessment & Plan:

## 2020-05-22 NOTE — Progress Notes (Signed)
Subjective:   Keith [name] is a 64 y.o. male who presents for Medicare Annual/Subsequent preventive examination.  Review of Systems    No ROS. Medicare Wellness Visit Cardiac Risk Factors include: advanced age (>50men, >66 women);diabetes mellitus;dyslipidemia;hypertension;male gender;obesity (BMI >30kg/m2)     Objective:    Today's Vitals   05/22/20 0820  BP: 132/82  Pulse: 85  Resp: 16  Temp: 98.2 F (36.8 C)  SpO2: 96%  Weight: 240 lb 9.6 oz (109.1 kg)  Height: 5' 10.5" (1.791 m)  PainSc: 0-No pain   Body mass index is 34.03 kg/m.  Advanced Directives 05/22/2020 11/11/2017  Does Patient Have a Medical Advance Directive? No No  Would patient like information on creating a medical advance directive? No - Patient declined No - Patient declined    Current Medications (verified) Outpatient Encounter Medications as of 05/22/2020  Medication Sig  . ALPRAZolam (XANAX) 0.5 MG tablet 1/2 - 1 tab by mouth twice per day as needed  . aspirin 81 MG EC tablet Take 81 mg by mouth daily.    . Cholecalciferol (VITAMIN D-3 PO) Take 1 tablet by mouth daily. PATIENT TAKES ONCE TABLET BY MOUTH ONCE DAILY . PATIENT NOT SURE OF DOSEAGE.  Marland Kitchen diazepam (VALIUM) 5 MG tablet Take 1 tablet (5 mg total) by mouth daily as needed for muscle spasms or sedation.  . Dulaglutide (TRULICITY) 7.82 NF/6.2ZH SOPN Inject 0.5 mLs into the skin once a week. (Patient not taking: Reported on 11/17/2019)  . fenofibrate (TRICOR) 145 MG tablet Take 1 tablet (145 mg total) by mouth daily.  Marland Kitchen glipiZIDE (GLUCOTROL XL) 10 MG 24 hr tablet Take 1 tablet (10 mg total) by mouth daily with breakfast. Annual appt due in March must see provider for future refills  . glucosamine-chondroitin 500-400 MG tablet Take 1 tablet by mouth 3 (three) times daily.  Marland Kitchen ibuprofen (ADVIL,MOTRIN) 400 MG tablet Take 400 mg by mouth every 6 (six) hours as needed for headache or moderate pain. Reported on 10/30/2015  . levothyroxine (SYNTHROID) 25  MCG tablet TAKE 1 TABLET EVERY DAY  . losartan (COZAAR) 50 MG tablet Take 1 tablet (50 mg total) by mouth daily. Annual appt due in March must see provider for future refills  . metFORMIN (GLUCOPHAGE-XR) 500 MG 24 hr tablet Take 4 tablets (2,000 mg total) by mouth daily with breakfast. TAKE 4 TABLETS EVERY DAY  WITH  BREAKFAST  . metoprolol succinate (TOPROL-XL) 100 MG 24 hr tablet Take 1 tablet (100 mg total) by mouth 2 (two) times daily. Annual appt due in March must see provider for future refills  . Omega-3 Fatty Acids (FISH OIL PO) Take 1 tablet by mouth daily.   . pantoprazole (PROTONIX) 40 MG tablet Take 1 tablet (40 mg total) by mouth daily. Annual appt due in March must see provider for future refills  . pioglitazone (ACTOS) 45 MG tablet Take 1 tablet (45 mg total) by mouth daily. Annual appt due in March must see provider for future refills  . tadalafil (CIALIS) 10 MG tablet Take 1 tablet (10 mg total) by mouth daily as needed for erectile dysfunction.   No facility-administered encounter medications on file as of 05/22/2020.    Allergies (verified) Lipitor [atorvastatin], Metoclopramide hcl, Penicillins, Sertraline hcl, and Zocor [simvastatin]   History: Past Medical History:  Diagnosis Date  . Anxiety   . Arthritis   . Cardiomyopathy    Echo (5/11) showed EF 30% with diffuse hypokinesis, normal wall thickness, mildly decreased RV  systolic function. This may be a cardiomyopathy due to hemochromatosis versus tachy-mediated. Unable to get cardiac MRI due to claustrophobia.  . Cervical spine pain    Chronic  . Chest pain    Exercise/adenosine Myoview showed EF 50% (3/10). Inferior hypokinesis. Perfusion images, inferior scar and ischemia in the apical anterior septum and in teh inferior wall . Suggestive of a CD. LHC (3/10) showed only lu minal irregulatrities in the coronaries w/ EF 55%, suggesting that myoview was a false positive, likely from diaphragmatic attnuation.  . Diabetes  mellitus   . Diverticulosis   . Fatty liver   . GERD (gastroesophageal reflux disease)   . Heart murmur   . Hereditary hemochromatosis    Compound heterozygote. Pt gets periodic phlebotomies  . History of colonic polyps   . Hyperlipidemia   . Hypertension   . Low back pain    The pt is S/P spinal fusion surgeries  . Multifocal atrial tachycardia (HCC)    Wandering atrial pacemaker. Holter monior in 5/11 showed frequent runs of symtomatic MAT with heart rate around 100. Pt is now  on amiodarone to try to suppress MAT. PFTs (6/11): FVC 105%, FEV1 109%, TLC 112%, DLCO 87% (minimal obstructive effect). LFTs stable on amiodarone  . Peptic ulcer disease   . Thyroid nodule    Past Surgical History:  Procedure Laterality Date  . BACK SURGERY  1991   Lumbar  . INGUINAL HERNIA REPAIR    . SPINE SURGERY    . TONSILLECTOMY     Family History  Problem Relation Age of Onset  . Colon polyps Mother   . Multiple sclerosis Mother   . Diabetes Mother   . Colitis Mother   . Hypertension Father   . Alcohol abuse Father   . Multiple sclerosis Sister   . Diabetes Paternal Grandmother   . Diabetes Paternal Grandfather   . Colon cancer Neg Hx    Social History   Socioeconomic History  . Marital status: Married    Spouse name: Not on file  . Number of children: 3  . Years of education: Not on file  . Highest education level: Not on file  Occupational History  . Occupation: Disabled    Employer: UNEMPLOYED  Tobacco Use  . Smoking status: Former Smoker    Quit date: 09/16/2003    Years since quitting: 16.6  . Smokeless tobacco: Never Used  Vaping Use  . Vaping Use: Never used  Substance and Sexual Activity  . Alcohol use: Yes    Alcohol/week: 1.0 standard drink    Types: 1 Standard drinks or equivalent per week    Comment: 3-4 drinks per month  . Drug use: Yes    Comment: Quit in 1980  . Sexual activity: Yes  Other Topics Concern  . Not on file  Social History Narrative   Married    Lives in [place]   3 children   Daily caffeine use - 3 drinks daily   Does not get regular exercise      [employer] on file.   Social Determinants of Health   Financial Resource Strain: Low Risk   . Difficulty of Paying Living Expenses: Not hard at all  Food Insecurity: No Food Insecurity  . Worried About Charity fundraiser in the Last Year: Never true  . Ran Out of Food in the Last Year: Never true  Transportation Needs: No Transportation Needs  . Lack of Transportation (Medical): No  . Lack  of Transportation (Non-Medical): No  Physical Activity: Inactive  . Days of Exercise per Week: 0 days  . Minutes of Exercise per Session: 0 min  Stress: No Stress Concern Present  . Feeling of Stress : Not at all  Social Connections:   . Frequency of Communication with Friends and Family: Not on file  . Frequency of Social Gatherings with Friends and Family: Not on file  . Attends Religious Services: Not on file  . Active Member of Clubs or Organizations: Not on file  . Attends Archivist Meetings: Not on file  . Marital Status: Not on file    Tobacco Counseling Counseling given: Not Answered   Clinical Intake:  Pre-visit preparation completed: Yes  Pain : No/denies pain Pain Score: 0-No pain     BMI - recorded: 33.56 Nutritional Status: BMI > 30  Obese Nutritional Risks: None Diabetes: Yes CBG done?: No Did pt. bring in CBG monitor from home?: No  How often do you need to have someone help you when you read instructions, pamphlets, or other written materials from your doctor or pharmacy?: 1 - Never What is the last grade level you completed in school?: GED  Diabetic? yes  Interpreter Needed?: No  Information entered by :: Chesni Vos N. Alvina Strother, LPN   Activities of Daily Living In your present state of health, do you have any difficulty performing the following activities: 05/22/2020  Hearing? N  Vision? N  Difficulty concentrating or  making decisions? N  Walking or climbing stairs? N  Dressing or bathing? N  Doing errands, shopping? N  Preparing Food and eating ? N  Using the Toilet? N  In the past six months, have you accidently leaked urine? N  Do you have problems with loss of bowel control? N  Managing your Medications? N  Managing your Finances? N  Housekeeping or managing your Housekeeping? N  Some recent data might be hidden    Patient Care Team: Biagio Borg, MD as PCP - General  Indicate any recent Medical Services you may have received from other than [facility] providers in the past year (date may be approximate).     Assessment:   This is a routine wellness examination for Tyaire.  Hearing/Vision screen No exam data present  Dietary issues and exercise activities discussed: Current Exercise Habits: The patient does not participate in regular exercise at present, Exercise limited by: None identified  Goals    .  Patient Stated (pt-stated)      Continue to decrease the amount of soda I drink then decrease the amount tea.       Depression Screen PHQ 2/9 Scores 05/22/2020 11/17/2019 05/19/2019 05/12/2018 11/11/2017 06/04/2017 01/21/2017  PHQ - 2 Score 0 0 0 0 1 0 0  PHQ- 9 Score - - - - 4 - -    Fall Risk Fall Risk  05/22/2020 11/17/2019 05/19/2019 05/12/2018 11/11/2017  Falls in the past year? 0 0 0 No No  Number falls in past yr: 0 - - - -  Injury with Fall? 0 - - - -  Risk for fall due to : No Fall Risks - - - -  Follow up Falls evaluation completed;Education provided - - - -    Any stairs in or around the home? No  If so, are there any without handrails? No  Home free of loose throw rugs in walkways, pet beds, electrical cords, etc? Yes  Adequate lighting in your home to reduce risk of falls?  Yes   ASSISTIVE DEVICES UTILIZED TO PREVENT FALLS:  Life alert? No  Use of a cane, walker or w/c? No  Grab bars in the bathroom? No  Shower chair or bench in shower? No  Elevated toilet seat or a handicapped  toilet? No   TIMED UP AND GO:  Was the test performed? No .  Length of time to ambulate 10 feet: 0 sec.   Gait steady and fast without use of assistive device  Cognitive Function:     6CIT Screen 05/22/2020  What Year? 0 points  What month? 0 points  What time? 0 points  Count back from 20 0 points  Months in reverse 0 points  Repeat phrase 0 points  Total Score 0    Immunizations Immunization History  Administered Date(s) Administered  . Influenza Whole 11/08/2009  . Influenza,inj,Quad PF,6+ Mos 05/12/2013, 08/31/2014, 05/06/2017, 06/24/2018, 05/19/2019  . Influenza-Unspecified 07/19/2015, 06/24/2018  . Pneumococcal Conjugate-13 10/26/2014  . Pneumococcal Polysaccharide-23 05/12/2013  . Td 09/15/1997, 11/02/2008  . Tdap 05/19/2019    TDAP status: Up to date Flu Vaccine status: Declined, Education has been provided regarding the importance of this vaccine but patient still declined. Advised may receive this vaccine at local pharmacy or Health Dept. Aware to provide a copy of the vaccination record if obtained from local pharmacy or Health Dept. Verbalized acceptance and understanding. Pneumococcal vaccine status: Up to date Covid-19 vaccine status: Declined, Education has been provided regarding the importance of this vaccine but patient still declined. Advised may receive this vaccine at local pharmacy or Health Dept.or vaccine clinic. Aware to provide a copy of the vaccination record if obtained from local pharmacy or Health Dept. Verbalized acceptance and understanding.  Qualifies for Shingles Vaccine? Yes   Zostavax completed No   Shingrix Completed?: No.    Education has been provided regarding the importance of this vaccine. Patient has been advised to call insurance company to determine out of pocket expense if they have not yet received this vaccine. Advised may also receive vaccine at local pharmacy or Health Dept. Verbalized acceptance and understanding.  Screening  Tests Health Maintenance  Topic Date Due  . COVID-19 Vaccine (1) Never done  . OPHTHALMOLOGY EXAM  07/01/2019  . INFLUENZA VACCINE  04/15/2020  . FOOT EXAM  05/18/2020  . HEMOGLOBIN A1C  11/12/2020  . COLONOSCOPY  02/16/2021  . TETANUS/TDAP  05/18/2029  . PNEUMOCOCCAL POLYSACCHARIDE VACCINE AGE 3-64 HIGH RISK  Completed  . Hepatitis C Screening  Completed  . HIV Screening  Completed    Health Maintenance  Health Maintenance Due  Topic Date Due  . COVID-19 Vaccine (1) Never done  . OPHTHALMOLOGY EXAM  07/01/2019  . INFLUENZA VACCINE  04/15/2020  . FOOT EXAM  05/18/2020    Colorectal cancer screening: Completed 02/17/2011. Repeat every 10 years  Lung Cancer Screening: (Low Dose CT Chest recommended if Age 67-80 years, 30 pack-year currently smoking OR have quit w/in 15years.) does qualify.   Lung Cancer Screening Referral: no  Additional Screening:  Hepatitis C Screening: does qualify; Completed yes  Vision Screening: Recommended annual ophthalmology exams for early detection of glaucoma and other disorders of the eye. Is the patient up to date with their annual eye exam?  Yes  Who is the provider or what is the name of the office in which the patient attends annual eye exams? [facility] on [address] If pt is not established with a provider, would they like to be referred to a provider to  establish care? No .   Dental Screening: Recommended annual dental exams for proper oral hygiene  Community Resource Referral / Chronic Care Management: CRR required this visit?  No   CCM required this visit?  No      Plan:     I have personally reviewed and noted the following in the patient's chart:   . Medical and social history . Use of alcohol, tobacco or illicit drugs  . Current medications and supplements . Functional ability and status . Nutritional status . Physical activity . Advanced directives . List of other physicians . Hospitalizations, surgeries, and ER visits  in previous 12 months . Vitals . Screenings to include cognitive, depression, and falls . Referrals and appointments  In addition, I have reviewed and discussed with patient certain preventive protocols, quality metrics, and best practice recommendations. A written personalized care plan for preventive services as well as general preventive health recommendations were provided to patient.     Sheral Flow, LPN   11/19/6438   Nurse Notes: n/a

## 2020-05-22 NOTE — Patient Instructions (Signed)
Keith [name] , Thank you for taking time to come for your Medicare Wellness Visit. I appreciate your ongoing commitment to your health goals. Please review the following plan we discussed and let me know if I can assist you in the future.   Screening recommendations/referrals: Colonoscopy: 02/17/2011; due every 10 years Recommended yearly ophthalmology/optometry visit for glaucoma screening and checkup Recommended yearly dental visit for hygiene and checkup  Vaccinations: Influenza vaccine: 05/19/2019 Pneumococcal vaccine: completed Tdap vaccine: 05/19/2019; due every 10 years Shingles vaccine: never done Covid-19: never done  Advanced directives: Advance directive discussed with you today. Even though you declined this today please call our office should you change your mind and we can give you the proper paperwork for you to fill out.  Conditions/risks identified: Yes; Reviewed health maintenance screenings with patient today and relevant education, vaccines, and/or referrals were provided. Please continue to do your personal lifestyle choices by: daily care of teeth and gums, regular physical activity (goal should be 5 days a week for 30 minutes), eat a healthy diet, avoid tobacco and drug use, limiting any alcohol intake, taking a low-dose aspirin (if not allergic or have been advised by your provider otherwise) and taking vitamins and minerals as recommended by your provider. Continue doing brain stimulating activities (puzzles, reading, adult coloring books, staying active) to keep memory sharp. Continue to eat heart healthy diet (full of fruits, vegetables, whole grains, lean protein, water--limit salt, fat, and sugar intake) and increase physical activity as tolerated.  Next appointment: Please schedule your next Medicare Wellness Visit with your Nurse Health Advisor in 1 year.  Preventive Care 40-64 Years, Male Preventive care refers to lifestyle choices and visits with your health care  provider that can promote health and wellness. What does preventive care include?  A yearly physical exam. This is also called an annual well check.  Dental exams once or twice a year.  Routine eye exams. Ask your health care provider how often you should have your eyes checked.  Personal lifestyle choices, including:  Daily care of your teeth and gums.  Regular physical activity.  Eating a healthy diet.  Avoiding tobacco and drug use.  Limiting alcohol use.  Practicing safe sex.  Taking low-dose aspirin every day starting at age 60. What happens during an annual well check? The services and screenings done by your health care provider during your annual well check will depend on your age, overall health, lifestyle risk factors, and family history of disease. Counseling  Your health care provider may ask you questions about your:  Alcohol use.  Tobacco use.  Drug use.  Emotional well-being.  Home and relationship well-being.  Sexual activity.  Eating habits.  Work and work Statistician. Screening  You may have the following tests or measurements:  Height, weight, and BMI.  Blood pressure.  Lipid and cholesterol levels. These may be checked every 5 years, or more frequently if you are over 21 years old.  Skin check.  Lung cancer screening. You may have this screening every year starting at age 30 if you have a 30-pack-year history of smoking and currently smoke or have quit within the past 15 years.  Fecal occult blood test (FOBT) of the stool. You may have this test every year starting at age 89.  Flexible sigmoidoscopy or colonoscopy. You may have a sigmoidoscopy every 5 years or a colonoscopy every 10 years starting at age 54.  Prostate cancer screening. Recommendations will vary depending on your family history and other  risks.  Hepatitis C blood test.  Hepatitis B blood test.  Sexually transmitted disease (STD) testing.  Diabetes screening. This  is done by checking your blood sugar (glucose) after you have not eaten for a while (fasting). You may have this done every 1-3 years. Discuss your test results, treatment options, and if necessary, the need for more tests with your health care provider. Vaccines  Your health care provider may recommend certain vaccines, such as:  Influenza vaccine. This is recommended every year.  Tetanus, diphtheria, and acellular pertussis (Tdap, Td) vaccine. You may need a Td booster every 10 years.  Zoster vaccine. You may need this after age 30.  Pneumococcal 13-valent conjugate (PCV13) vaccine. You may need this if you have certain conditions and have not been vaccinated.  Pneumococcal polysaccharide (PPSV23) vaccine. You may need one or two doses if you smoke cigarettes or if you have certain conditions. Talk to your health care provider about which screenings and vaccines you need and how often you need them. This information is not intended to replace advice given to you by your health care provider. Make sure you discuss any questions you have with your health care provider. Document Released: 09/28/2015 Document Revised: 05/21/2016 Document Reviewed: 07/03/2015 Elsevier Interactive Patient Education  2017 [employer] Prevention in the Home Falls can cause injuries. They can happen to people of all ages. There are many things you can do to make your home safe and to help prevent falls. What can I do on the outside of my home?  Regularly fix the edges of walkways and driveways and fix any cracks.  Remove anything that might make you trip as you walk through a door, such as a raised step or threshold.  Trim any bushes or trees on the path to your home.  Use bright outdoor lighting.  Clear any walking paths of anything that might make someone trip, such as rocks or tools.  Regularly check to see if handrails are loose or broken. Make sure that both sides of any steps have  handrails.  Any raised decks and porches should have guardrails on the edges.  Have any leaves, snow, or ice cleared regularly.  Use sand or salt on walking paths during winter.  Clean up any spills in your garage right away. This includes oil or grease spills. What can I do in the bathroom?  Use night lights.  Install grab bars by the toilet and in the tub and shower. Do not use towel bars as grab bars.  Use non-skid mats or decals in the tub or shower.  If you need to sit down in the shower, use a plastic, non-slip stool.  Keep the floor dry. Clean up any water that spills on the floor as soon as it happens.  Remove soap buildup in the tub or shower regularly.  Attach bath mats securely with double-sided non-slip rug tape.  Do not have throw rugs and other things on the floor that can make you trip. What can I do in the bedroom?  Use night lights.  Make sure that you have a light by your bed that is easy to reach.  Do not use any sheets or blankets that are too big for your bed. They should not hang down onto the floor.  Have a firm chair that has side arms. You can use this for support while you get dressed.  Do not have throw rugs and other things on the floor that can  make you trip. What can I do in the kitchen?  Clean up any spills right away.  Avoid walking on wet floors.  Keep items that you use a lot in easy-to-reach places.  If you need to reach something above you, use a strong step stool that has a grab bar.  Keep electrical cords out of the way.  Do not use floor polish or wax that makes floors slippery. If you must use wax, use non-skid floor wax.  Do not have throw rugs and other things on the floor that can make you trip. What can I do with my stairs?  Do not leave any items on the stairs.  Make sure that there are handrails on both sides of the stairs and use them. Fix handrails that are broken or loose. Make sure that handrails are as long as  the stairways.  Check any carpeting to make sure that it is firmly attached to the stairs. Fix any carpet that is loose or worn.  Avoid having throw rugs at the top or bottom of the stairs. If you do have throw rugs, attach them to the floor with carpet tape.  Make sure that you have a light switch at the top of the stairs and the bottom of the stairs. If you do not have them, ask someone to add them for you. What else can I do to help prevent falls?  Wear shoes that:  Do not have high heels.  Have rubber bottoms.  Are comfortable and fit you well.  Are closed at the toe. Do not wear sandals.  If you use a stepladder:  Make sure that it is fully opened. Do not climb a closed stepladder.  Make sure that both sides of the stepladder are locked into place.  Ask someone to hold it for you, if possible.  Clearly mark and make sure that you can see:  Any grab bars or handrails.  First and last steps.  Where the edge of each step is.  Use tools that help you move around (mobility aids) if they are needed. These include:  Canes.  Walkers.  Scooters.  Crutches.  Turn on the lights when you go into a dark area. Replace any light bulbs as soon as they burn out.  Set up your furniture so you have a clear path. Avoid moving your furniture around.  If any of your floors are uneven, fix them.  If there are any pets around you, be aware of where they are.  Review your medicines with your doctor. Some medicines can make you feel dizzy. This can increase your chance of falling. Ask your doctor what other things that you can do to help prevent falls. This information is not intended to replace advice given to you by your health care provider. Make sure you discuss any questions you have with your health care provider. Document Released: 06/28/2009 Document Revised: 02/07/2016 Document Reviewed: 10/06/2014 Elsevier Interactive Patient Education  2017 [employer].

## 2020-05-25 DIAGNOSIS — H2513 Age-related nuclear cataract, bilateral: Secondary | ICD-10-CM | POA: Diagnosis not present

## 2020-05-25 DIAGNOSIS — E119 Type 2 diabetes mellitus without complications: Secondary | ICD-10-CM | POA: Diagnosis not present

## 2020-05-25 LAB — HM DIABETES EYE EXAM

## 2020-05-27 ENCOUNTER — Encounter: Payer: Self-pay | Admitting: Internal Medicine

## 2020-05-27 NOTE — Assessment & Plan Note (Signed)
Pt states bP At home < 140/90, cont ciurrent tx

## 2020-05-27 NOTE — Assessment & Plan Note (Addendum)
With mild increased a1c, declines increesed med tx, plans for better diet/excercise/wt control  I spent 31 minutes in preparing to see the patient by review of recent labs, imaging and procedures, obtaining and reviewing separately obtained history, communicating with the patient and family or caregiver, ordering medications, tests or procedures, and documenting clinical information in the EHR including the differential Dx, treatment, and any further evaluation and other management of dm, htn, hld

## 2020-05-27 NOTE — Assessment & Plan Note (Signed)
Has been statin intolerant, declines further tx, for low chol dm diet

## 2020-08-21 ENCOUNTER — Other Ambulatory Visit: Payer: Self-pay | Admitting: Internal Medicine

## 2020-08-21 NOTE — Telephone Encounter (Signed)
Please refill as per office routine med refill policy (all routine meds refilled for 3 mo or monthly per pt preference up to one year from last visit, then month to month grace period for 3 mo, then further med refills will have to be denied)  

## 2020-08-27 ENCOUNTER — Other Ambulatory Visit: Payer: Self-pay | Admitting: Internal Medicine

## 2020-08-30 ENCOUNTER — Other Ambulatory Visit: Payer: Self-pay | Admitting: Internal Medicine

## 2020-09-05 ENCOUNTER — Emergency Department (HOSPITAL_COMMUNITY)
Admission: EM | Admit: 2020-09-05 | Discharge: 2020-09-05 | Disposition: A | Discharge status: Home / Self Care | Payer: Medicare HMO | Attending: Emergency Medicine | Admitting: Emergency Medicine

## 2020-09-05 ENCOUNTER — Encounter (HOSPITAL_COMMUNITY): Payer: Self-pay | Admitting: Emergency Medicine

## 2020-09-05 DIAGNOSIS — Z7982 Long term (current) use of aspirin: Secondary | ICD-10-CM | POA: Diagnosis not present

## 2020-09-05 DIAGNOSIS — R040 Epistaxis: Secondary | ICD-10-CM | POA: Insufficient documentation

## 2020-09-05 DIAGNOSIS — I10 Essential (primary) hypertension: Secondary | ICD-10-CM | POA: Diagnosis not present

## 2020-09-05 DIAGNOSIS — Z7984 Long term (current) use of oral hypoglycemic drugs: Secondary | ICD-10-CM | POA: Insufficient documentation

## 2020-09-05 DIAGNOSIS — Z79899 Other long term (current) drug therapy: Secondary | ICD-10-CM | POA: Diagnosis not present

## 2020-09-05 DIAGNOSIS — E119 Type 2 diabetes mellitus without complications: Secondary | ICD-10-CM | POA: Insufficient documentation

## 2020-09-05 DIAGNOSIS — Z87891 Personal history of nicotine dependence: Secondary | ICD-10-CM | POA: Diagnosis not present

## 2020-09-05 MED ORDER — OXYMETAZOLINE HCL 0.05 % NA SOLN
1.0000 | Freq: Once | NASAL | Status: AC
  Administered 2020-09-05: 06:00:00 1 via NASAL
  Filled 2020-09-05: qty 30

## 2020-09-05 NOTE — ED Provider Notes (Signed)
[facility] DEPT Provider Note   CSN: [id number] Arrival date & time: 09/05/20  [id number]     History Chief Complaint  Patient presents with  . Epistaxis    Keith [name] is a 64 y.o. male.  Patient presents to the emergency department with a chief complaint of nosebleed.  He states that he awoke this morning with a nosebleed out of his right nostril.  He takes a baby aspirin daily.  He denies any injury.  He states he has had difficulty controlling the bleeding.  States that as soon as he removes the tissue or washrag, the clot breaks and starts to bleed again.  He denies any other associated symptoms.  The history is provided by the patient. No language interpreter was used.       Past Medical History:  Diagnosis Date  . Anxiety   . Arthritis   . Cardiomyopathy    Echo (5/11) showed EF 30% with diffuse hypokinesis, normal wall thickness, mildly decreased RV systolic function. This may be a cardiomyopathy due to hemochromatosis versus tachy-mediated. Unable to get cardiac MRI due to claustrophobia.  . Cervical spine pain    Chronic  . Chest pain    Exercise/adenosine Myoview showed EF 50% (3/10). Inferior hypokinesis. Perfusion images, inferior scar and ischemia in the apical anterior septum and in teh inferior wall . Suggestive of a CD. LHC (3/10) showed only lu minal irregulatrities in the coronaries w/ EF 55%, suggesting that myoview was a false positive, likely from diaphragmatic attnuation.  . Diabetes mellitus   . Diverticulosis   . Fatty liver   . GERD (gastroesophageal reflux disease)   . Heart murmur   . Hereditary hemochromatosis    Compound heterozygote. Pt gets periodic phlebotomies  . History of colonic polyps   . Hyperlipidemia   . Hypertension   . Low back pain    The pt is S/P spinal fusion surgeries  . Multifocal atrial tachycardia (HCC)    Wandering atrial pacemaker. Holter monior in 5/11 showed frequent runs of symtomatic  MAT with heart rate around 100. Pt is now  on amiodarone to try to suppress MAT. PFTs (6/11): FVC 105%, FEV1 109%, TLC 112%, DLCO 87% (minimal obstructive effect). LFTs stable on amiodarone  . Peptic ulcer disease   . Thyroid nodule     Patient Active Problem List   Diagnosis Date Noted  . Erectile dysfunction 05/20/2019  . Increased prostate specific antigen (PSA) velocity 05/19/2019  . Asbestos exposure 11/14/2017  . Fatigue 11/14/2017  . Vision loss, bilateral 05/06/2017  . Preventative health care 04/29/2016  . Degenerative disc disease, lumbar 03/14/2016  . Skin lesion 10/30/2015  . Atrial tachycardia ([place]) 09/05/2015  . Arthralgia 11/01/2014  . Subacromial bursitis 11/01/2014  . Bilateral knee pain 10/26/2014  . Left shoulder pain 10/26/2014  . Bilateral hand pain 10/26/2013  . Right lateral epicondylitis 10/26/2013  . NICM (nonischemic cardiomyopathy) ([place]) 08/18/2013  . Right thyroid nodule 05/12/2013  . Hypothyroidism due to amiodarone 05/12/2012  . Abnormal TSH 11/13/2011  . Fatty infiltration of liver 08/01/2011  . Iron storage disease 08/01/2011  . Diverticulosis of colon (without mention of hemorrhage) 08/01/2011  . Palpitations 07/02/2011  . TRIGGER FINGER, RIGHT MIDDLE 11/12/2010  . FLATULENCE ERUCTATION AND GAS PAIN 10/11/2010  . Chronic systolic heart failure ([place]) 01/22/2010  . TACHYCARDIA 01/22/2010  . Cervicalgia 05/03/2009  . Hemochromatosis 01/23/2009  . OTHER SPECIFIED ARTHROPATHY MULTIPLE SITES 01/23/2009  . HEMOCHROMATOSIS 01/23/2009  . ABNORMAL STRESS  ELECTROCARDIOGRAM 11/13/2008  . CHEST PAIN 11/02/2008  . TRANSAMINASES, SERUM, ELEVATED 11/02/2008  . Dysfunction of eustachian tube 05/02/2008  . OTITIS MEDIA, SEROUS 01/12/2008  . Diabetes ([place]) 11/02/2007  . Anxiety state 11/02/2007  . PEPTIC ULCER DISEASE 11/02/2007  . LOW BACK PAIN 11/02/2007  . Hyperlipidemia 06/07/2007  . Essential hypertension 06/07/2007  . GERD 06/07/2007  . POLYP,  GALLBLADDER 06/07/2007  . COLONIC POLYPS, HX OF 06/07/2007  . FATTY LIVER DISEASE, HX OF 06/07/2007    Past Surgical History:  Procedure Laterality Date  . BACK SURGERY  1991   Lumbar  . INGUINAL HERNIA REPAIR    . SPINE SURGERY    . TONSILLECTOMY         Family History  Problem Relation Age of Onset  . Colon polyps Mother   . Multiple sclerosis Mother   . Diabetes Mother   . Colitis Mother   . Hypertension Father   . Alcohol abuse Father   . Multiple sclerosis Sister   . Diabetes Paternal Grandmother   . Diabetes Paternal Grandfather   . Colon cancer Neg Hx     Social History   Tobacco Use  . Smoking status: Former Smoker    Quit date: 09/16/2003    Years since quitting: 16.9  . Smokeless tobacco: Never Used  Vaping Use  . Vaping Use: Never used  Substance Use Topics  . Alcohol use: Yes    Alcohol/week: 1.0 standard drink    Types: 1 Standard drinks or equivalent per week    Comment: 3-4 drinks per month  . Drug use: Yes    Comment: Quit in [place] Medications Prior to Admission medications   Medication Sig Start Date End Date Taking? Authorizing Provider  ALPRAZolam Duanne Moron) 0.5 MG tablet 1/2 - 1 tab by mouth twice per day as needed 11/16/18   Biagio Borg, MD  aspirin 81 MG EC tablet Take 81 mg by mouth daily.      [provider]  Cholecalciferol (VITAMIN D-3 PO) Take 1 tablet by mouth daily. PATIENT TAKES ONCE TABLET BY MOUTH ONCE DAILY . PATIENT NOT SURE OF DOSEAGE.    [provider]  diazepam (VALIUM) 5 MG tablet Take 1 tablet (5 mg total) by mouth daily as needed for muscle spasms or sedation. 05/12/18   Biagio Borg, MD  Dulaglutide (TRULICITY) 1.5 DT/2.6ZT SOPN INJECT  1.5MG  (0.5  MLS)  INTO  THE  SKIN ONE TIME WEEKLY 12/15/18   [provider]  fenofibrate (TRICOR) 145 MG tablet TAKE 1 TABLET EVERY DAY 08/21/20   Biagio Borg, MD  glipiZIDE (GLUCOTROL XL) 10 MG 24 hr tablet TAKE 1 TABLET BY MOUTH  DAILY WITH BREAKFAST.  NEED APPOINTMENT 08/21/20   Biagio Borg, MD  glucosamine-chondroitin 500-400 MG tablet Take 1 tablet by mouth 3 (three) times daily.    [provider]  ibuprofen (ADVIL,MOTRIN) 400 MG tablet Take 400 mg by mouth every 6 (six) hours as needed for headache or moderate pain. Reported on 10/30/2015    [provider]  levothyroxine (SYNTHROID) 25 MCG tablet TAKE 1 TABLET EVERY DAY (NEED MD APPOINTMENT) 08/21/20   Biagio Borg, MD  losartan (COZAAR) 50 MG tablet TAKE 1 TABLET EVERY DAY (NEED MD APPOINTMENT) 08/21/20   Biagio Borg, MD  metFORMIN (GLUCOPHAGE-XR) 500 MG 24 hr tablet TAKE 4 TABLETS EVERY DAY WITH BREAKFAST 08/21/20   Biagio Borg, MD  metoprolol succinate (TOPROL-XL) 100 MG 24  hr tablet TAKE 1 TABLET BY MOUTH TWICE DAILY. NEED APPOINTMENT 08/21/20   Biagio Borg, MD  Omega-3 Fatty Acids (FISH OIL PO) Take 1 tablet by mouth daily.     [provider]  pantoprazole (PROTONIX) 40 MG tablet TAKE 1 TABLET BY MOUTH  DAILY. ANNUAL APPT DUE IN [facility] MUST SEE PROVIDER FOR FUTURE REFILLS 08/21/20   Biagio Borg, MD  pioglitazone (ACTOS) 45 MG tablet TAKE 1 TABLET BY MOUTH  DAILY. ANNUAL APPT DUE IN [place] MUST SEE PROVIDER FOR FUTURE REFILLS 08/21/20   Biagio Borg, MD  tadalafil (CIALIS) 10 MG tablet TAKE 1 TABLET BY MOUTH ONCE DAILY AS NEEDED FOR ERECTILE DYSFUNCTION 08/31/20   Biagio Borg, MD    Allergies    Lipitor [atorvastatin], Metoclopramide hcl, Penicillins, Sertraline hcl, and Zocor [simvastatin]  Review of Systems   Review of Systems  All other systems reviewed and are negative.   Physical Exam Updated Vital Signs BP (!) 190/103 (BP Location: Right Arm)   Pulse 79   Temp 97.9 F (36.6 C) (Oral)   Resp 18   Ht 5\' 10"  (1.778 m)   Wt 108.9 kg   SpO2 94%   BMI 34.44 kg/m   Physical Exam Vitals and nursing note reviewed.  Constitutional:      General: He is not in acute distress.    Appearance: He is well-developed. He is not ill-appearing.   HENT:     Head: Normocephalic and atraumatic.     Nose:     Comments: No active bleeding at this time. Eyes:     Conjunctiva/sclera: Conjunctivae normal.  Cardiovascular:     Rate and Rhythm: Normal rate.  Pulmonary:     Effort: Pulmonary effort is normal. No respiratory distress.  Abdominal:     General: There is no distension.  Musculoskeletal:     Cervical back: Neck supple.     Comments: Moves all extremities  Skin:    General: Skin is warm and dry.  Neurological:     Mental Status: He is alert and oriented to person, place, and time.  Psychiatric:        Mood and Affect: Mood normal.        Behavior: Behavior normal.     ED Results / Procedures / Treatments   Labs (all labs ordered are listed, but only abnormal results are displayed) Labs Reviewed - No data to display  EKG None  Radiology No results found.  Procedures Procedures (including critical care time)  Medications Ordered in ED Medications  oxymetazoline (AFRIN) 0.05 % nasal spray 1 spray (1 spray Each Nare Given 09/05/20 0536)    ED Course  I have reviewed the triage vital signs and the nursing notes.  Pertinent labs & imaging results that were available during my care of the patient were reviewed by me and considered in my medical decision making (see chart for details).    MDM Rules/Calculators/A&P                          Patient with right-sided nosebleed.  Appears to have been anterior.  No bleeding at this time.  He has been monitored for about an hour without any repeat bleeding.  Discharged home with some Afrin and instructions for how to manage his nosebleed if it rebleeds. Final Clinical Impression(s) / ED Diagnoses Final diagnoses:  Epistaxis    Rx / DC Orders ED Discharge Orders  None       Montine Circle, PA-C 09/05/20 [id number]    Merrily Pew, MD 09/05/20 [phone]

## 2020-09-05 NOTE — Discharge Instructions (Addendum)
If your nose starts bleeding again, use 5 sprays of the afrin, lean forward, and pinch the tip of your nose hard for 10 minutes.  This should stop the bleeding.  If this doesn't work, return to the ER for packing.

## 2020-09-05 NOTE — ED Triage Notes (Signed)
Pt reports that he woke up with epistaxis around 1a. Takes a baby aspirin daily. Still bleeding, controlled with pressure.

## 2020-09-06 ENCOUNTER — Telehealth: Payer: Self-pay | Admitting: Internal Medicine

## 2020-09-06 NOTE — Telephone Encounter (Signed)
Patient was seen in the ED yesterday and stated his intial bp was 211/150 and then it came down to 190/103. Patient did not want team health since he was just at ED yesterday, just wants to speak to Dr. Judi Cong assistant about his blood pressure.  [phone]

## 2020-09-10 NOTE — Telephone Encounter (Signed)
Tried to contact pt and discuss pts concerns with him about is BP. Pts VM came on and was not set up.

## 2020-09-13 ENCOUNTER — Other Ambulatory Visit: Payer: Self-pay

## 2020-09-13 ENCOUNTER — Encounter: Payer: Self-pay | Admitting: Internal Medicine

## 2020-09-13 ENCOUNTER — Ambulatory Visit (INDEPENDENT_AMBULATORY_CARE_PROVIDER_SITE_OTHER): Payer: Medicare HMO | Admitting: Internal Medicine

## 2020-09-13 DIAGNOSIS — R040 Epistaxis: Secondary | ICD-10-CM

## 2020-09-13 DIAGNOSIS — I10 Essential (primary) hypertension: Secondary | ICD-10-CM | POA: Diagnosis not present

## 2020-09-13 DIAGNOSIS — G8929 Other chronic pain: Secondary | ICD-10-CM

## 2020-09-13 DIAGNOSIS — M5442 Lumbago with sciatica, left side: Secondary | ICD-10-CM | POA: Diagnosis not present

## 2020-09-13 DIAGNOSIS — M5441 Lumbago with sciatica, right side: Secondary | ICD-10-CM

## 2020-09-13 MED ORDER — AMLODIPINE-OLMESARTAN 5-40 MG PO TABS: 1.0000 | ORAL_TABLET | Freq: Every day | ORAL | 3 refills | Status: DC

## 2020-09-13 NOTE — Progress Notes (Signed)
Subjective:  Patient ID: Keith [name], male    DOB: Jun 14, 1956  Age: 64 y.o. MRN: [id number]  CC: Follow-up   HPI Keith [name] presents for HTN - high BP, nose bleed on 12/22 - SBP was 220 in the ER  Outpatient Medications Prior to Visit  Medication Sig Dispense Refill  . ALPRAZolam (XANAX) 0.5 MG tablet 1/2 - 1 tab by mouth twice per day as needed 60 tablet 2  . aspirin 81 MG EC tablet Take 81 mg by mouth daily.    . Cholecalciferol (VITAMIN D-3 PO) Take 1 tablet by mouth daily. PATIENT TAKES ONCE TABLET BY MOUTH ONCE DAILY . PATIENT NOT SURE OF DOSEAGE.    Marland Kitchen diazepam (VALIUM) 5 MG tablet Take 1 tablet (5 mg total) by mouth daily as needed for muscle spasms or sedation. 30 tablet 0  . Dulaglutide (TRULICITY) 1.5 0000000 SOPN INJECT  1.5MG  (0.5  MLS)  INTO  THE  SKIN ONE TIME WEEKLY    . fenofibrate (TRICOR) 145 MG tablet TAKE 1 TABLET EVERY DAY 90 tablet 3  . glipiZIDE (GLUCOTROL XL) 10 MG 24 hr tablet TAKE 1 TABLET BY MOUTH  DAILY WITH BREAKFAST. NEED APPOINTMENT 90 tablet 1  . glucosamine-chondroitin 500-400 MG tablet Take 1 tablet by mouth 3 (three) times daily.    Marland Kitchen ibuprofen (ADVIL,MOTRIN) 400 MG tablet Take 400 mg by mouth every 6 (six) hours as needed for headache or moderate pain. Reported on 10/30/2015    . levothyroxine (SYNTHROID) 25 MCG tablet TAKE 1 TABLET EVERY DAY (NEED MD APPOINTMENT) 90 tablet 1  . losartan (COZAAR) 50 MG tablet TAKE 1 TABLET EVERY DAY (NEED MD APPOINTMENT) 90 tablet 1  . metFORMIN (GLUCOPHAGE-XR) 500 MG 24 hr tablet TAKE 4 TABLETS EVERY DAY WITH BREAKFAST 360 tablet 1  . metoprolol succinate (TOPROL-XL) 100 MG 24 hr tablet TAKE 1 TABLET BY MOUTH TWICE DAILY. NEED APPOINTMENT 180 tablet 1  . Omega-3 Fatty Acids (FISH OIL PO) Take 1 tablet by mouth daily.     . pantoprazole (PROTONIX) 40 MG tablet TAKE 1 TABLET BY MOUTH  DAILY. ANNUAL APPT DUE IN [facility] MUST SEE PROVIDER FOR FUTURE REFILLS 90 tablet 1  . pioglitazone (ACTOS) 45 MG tablet TAKE 1  TABLET BY MOUTH  DAILY. ANNUAL APPT DUE IN [facility] MUST SEE PROVIDER FOR FUTURE REFILLS 90 tablet 1  . tadalafil (CIALIS) 10 MG tablet TAKE 1 TABLET BY MOUTH ONCE DAILY AS NEEDED FOR ERECTILE DYSFUNCTION 30 tablet 0   No facility-administered medications prior to visit.    ROS: Review of Systems  Constitutional: Negative for appetite change, fatigue and unexpected weight change.  HENT: Positive for nosebleeds. Negative for congestion, sneezing, sore throat and trouble swallowing.   Eyes: Negative for itching and visual disturbance.  Respiratory: Negative for cough.   Cardiovascular: Negative for chest pain, palpitations and leg swelling.  Gastrointestinal: Negative for abdominal distention, blood in stool, diarrhea and nausea.  Genitourinary: Negative for frequency and hematuria.  Musculoskeletal: Positive for back pain. Negative for gait problem, joint swelling and neck pain.  Skin: Negative for rash.  Neurological: Negative for dizziness, tremors, speech difficulty and weakness.  Psychiatric/Behavioral: Negative for agitation, dysphoric mood and sleep disturbance. The patient is not nervous/anxious.     Objective:  BP (!) 162/98   Pulse 77   Temp 99.1 F (37.3 C) (Oral)   Ht 5\' 10"  (1.778 m)   Wt 238 lb (108 kg)   SpO2 97%   BMI 34.15  kg/m   BP Readings from Last 3 Encounters:  09/13/20 (!) 162/98  09/05/20 (!) 159/99  05/22/20 (!) 150/92    Wt Readings from Last 3 Encounters:  09/13/20 238 lb (108 kg)  09/05/20 240 lb (108.9 kg)  05/22/20 238 lb (108 kg)    Physical Exam Constitutional:      General: He is not in acute distress.    Appearance: He is well-developed.     Comments: NAD  HENT:     Mouth/Throat:     Mouth: Oropharynx is clear and moist.  Eyes:     Conjunctiva/sclera: Conjunctivae normal.     Pupils: Pupils are equal, round, and reactive to light.  Neck:     Thyroid: No thyromegaly.     Vascular: No JVD.  Cardiovascular:     Rate and Rhythm:  Normal rate and regular rhythm.     Pulses: Intact distal pulses.     Heart sounds: Normal heart sounds. No murmur heard. No friction rub. No gallop.   Pulmonary:     Effort: Pulmonary effort is normal. No respiratory distress.     Breath sounds: Normal breath sounds. No wheezing or rales.  Chest:     Chest wall: No tenderness.  Abdominal:     General: Bowel sounds are normal. There is no distension.     Palpations: Abdomen is soft. There is no mass.     Tenderness: There is no abdominal tenderness. There is no guarding or rebound.  Musculoskeletal:        General: No tenderness or edema. Normal range of motion.     Cervical back: Normal range of motion.  Lymphadenopathy:     Cervical: No cervical adenopathy.  Skin:    General: Skin is warm and dry.     Findings: No rash.  Neurological:     Mental Status: He is alert and oriented to person, place, and time.     Cranial Nerves: No cranial nerve deficit.     Motor: No abnormal muscle tone.     Coordination: He displays a negative Romberg sign. Coordination normal.     Gait: Gait normal.     Deep Tendon Reflexes: Reflexes are normal and symmetric.  Psychiatric:        Mood and Affect: Mood and affect normal.        Behavior: Behavior normal.        Thought Content: Thought content normal.        Judgment: Judgment normal.    LS tender  Lab Results  Component Value Date   WBC 8.8 05/16/2019   HGB 16.3 05/16/2019   HCT 47.4 05/16/2019   PLT 195.0 05/16/2019   GLUCOSE 142 (H) 05/15/2020   CHOL 176 05/15/2020   TRIG 139.0 05/15/2020   HDL 30.40 (L) 05/15/2020   LDLDIRECT 127.0 11/15/2019   LDLCALC 118 (H) 05/15/2020   ALT 21 05/15/2020   AST 19 05/15/2020   NA 138 05/15/2020   K 4.1 05/15/2020   CL 103 05/15/2020   CREATININE 1.24 05/15/2020   BUN 12 05/15/2020   CO2 27 05/15/2020   TSH 4.51 (H) 05/16/2019   PSA 1.21 11/15/2019   INR 1.1 (H) 07/09/2016   HGBA1C 7.6 (H) 05/15/2020   MICROALBUR 1.0 05/16/2019     No results found.  Assessment & Plan:   There are no diagnoses linked to this encounter.   No orders of the defined types were placed in this encounter.    Follow-up: No  follow-ups on file.  Walker Kehr, MD

## 2020-09-13 NOTE — Assessment & Plan Note (Signed)
Worse Take Losartan 50 mg twice a day until you get a new medication, then stop Losartan and start Amlodipine - Olmesartan. NAS diet

## 2020-09-13 NOTE — Patient Instructions (Signed)
Take Losartan 50 mg twice a day until you get a new medication, then stop Losartan and start Amlodipine - Olmesartan  Hypertension, Adult Hypertension is another name for high blood pressure. High blood pressure forces your heart to work harder to pump blood. This can cause problems over time. There are two numbers in a blood pressure reading. There is a top number (systolic) over a bottom number (diastolic). It is best to have a blood pressure that is below 120/80. Healthy choices can help lower your blood pressure, or you may need medicine to help lower it. What are the causes? The cause of this condition is not known. Some conditions may be related to high blood pressure. What increases the risk?  Smoking.  Having type 2 diabetes mellitus, high cholesterol, or both.  Not getting enough exercise or physical activity.  Being overweight.  Having too much fat, sugar, calories, or salt (sodium) in your diet.  Drinking too much alcohol.  Having long-term (chronic) kidney disease.  Having a family history of high blood pressure.  Age. Risk increases with age.  Race. You may be at higher risk if you are African American.  Gender. Men are at higher risk than women before age 19. After age 23, women are at higher risk than men.  Having obstructive sleep apnea.  Stress. What are the signs or symptoms?  High blood pressure may not cause symptoms. Very high blood pressure (hypertensive crisis) may cause: ? Headache. ? Feelings of worry or nervousness (anxiety). ? Shortness of breath. ? Nosebleed. ? A feeling of being sick to your stomach (nausea). ? Throwing up (vomiting). ? Changes in how you see. ? Very bad chest pain. ? Seizures. How is this treated?  This condition is treated by making healthy lifestyle changes, such as: ? Eating healthy foods. ? Exercising more. ? Drinking less alcohol.  Your health care provider may prescribe medicine if lifestyle changes are not  enough to get your blood pressure under control, and if: ? Your top number is above 130. ? Your bottom number is above 80.  Your personal target blood pressure may vary. Follow these instructions at home: Eating and drinking   If told, follow the DASH eating plan. To follow this plan: ? Fill one half of your plate at each meal with fruits and vegetables. ? Fill one fourth of your plate at each meal with whole grains. Whole grains include whole-wheat pasta, brown rice, and whole-grain bread. ? Eat or drink low-fat dairy products, such as skim milk or low-fat yogurt. ? Fill one fourth of your plate at each meal with low-fat (lean) proteins. Low-fat proteins include fish, chicken without skin, eggs, beans, and tofu. ? Avoid fatty meat, cured and processed meat, or chicken with skin. ? Avoid pre-made or processed food.  Eat less than 1,500 mg of salt each day.  Do not drink alcohol if: ? Your doctor tells you not to drink. ? You are pregnant, may be pregnant, or are planning to become pregnant.  If you drink alcohol: ? Limit how much you use to:  0-1 drink a day for women.  0-2 drinks a day for men. ? Be aware of how much alcohol is in your drink. In the [place] one drink equals one 12 oz bottle of beer (355 mL), one 5 oz glass of wine (148 mL), or one 1 oz glass of hard liquor (44 mL). Lifestyle   Work with your doctor to stay at a healthy weight or  to lose weight. Ask your doctor what the best weight is for you.  Get at least 30 minutes of exercise most days of the week. This may include walking, swimming, or biking.  Get at least 30 minutes of exercise that strengthens your muscles (resistance exercise) at least 3 days a week. This may include lifting weights or doing Pilates.  Do not use any products that contain nicotine or tobacco, such as cigarettes, e-cigarettes, and chewing tobacco. If you need help quitting, ask your doctor.  Check your blood pressure at home as told by  your doctor.  Keep all follow-up visits as told by your doctor. This is important. Medicines  Take over-the-counter and prescription medicines only as told by your doctor. Follow directions carefully.  Do not skip doses of blood pressure medicine. The medicine does not work as well if you skip doses. Skipping doses also puts you at risk for problems.  Ask your doctor about side effects or reactions to medicines that you should watch for. Contact a doctor if you:  Think you are having a reaction to the medicine you are taking.  Have headaches that keep coming back (recurring).  Feel dizzy.  Have swelling in your ankles.  Have trouble with your vision. Get help right away if you:  Get a very bad headache.  Start to feel mixed up (confused).  Feel weak or numb.  Feel faint.  Have very bad pain in your: ? Chest. ? Belly (abdomen).  Throw up more than once.  Have trouble breathing. Summary  Hypertension is another name for high blood pressure.  High blood pressure forces your heart to work harder to pump blood.  For most people, a normal blood pressure is less than 120/80.  Making healthy choices can help lower blood pressure. If your blood pressure does not get lower with healthy choices, you may need to take medicine. This information is not intended to replace advice given to you by your health care provider. Make sure you discuss any questions you have with your health care provider. Document Revised: 05/12/2018 Document Reviewed: 05/12/2018 Elsevier Patient Education  2020 [employer].

## 2020-09-13 NOTE — Assessment & Plan Note (Signed)
New Treat HTN No relapse

## 2020-09-13 NOTE — Assessment & Plan Note (Signed)
Chronic Use a stationary bike Tylenol prn

## 2020-10-01 ENCOUNTER — Telehealth: Payer: Self-pay | Admitting: Internal Medicine

## 2020-10-01 NOTE — Telephone Encounter (Signed)
    Patient calling to report amLODipine-olmesartan (AZOR) 5-40 MG tablet is $125 thru [facility]  Patient states he will not have a copay if medications not combined, prescribed separately .

## 2020-10-02 MED ORDER — OLMESARTAN MEDOXOMIL 40 MG PO TABS: 40.0000 mg | ORAL_TABLET | Freq: Every day | ORAL | 3 refills | Status: DC

## 2020-10-02 MED ORDER — AMLODIPINE BESYLATE 5 MG PO TABS: 5.0000 mg | ORAL_TABLET | Freq: Every day | ORAL | 3 refills | Status: DC

## 2020-10-02 NOTE — Telephone Encounter (Signed)
Ok this is changed to the individual meds

## 2020-10-03 NOTE — Telephone Encounter (Signed)
Notified pt w/MD response.../lmb 

## 2020-10-10 ENCOUNTER — Other Ambulatory Visit (INDEPENDENT_AMBULATORY_CARE_PROVIDER_SITE_OTHER): Payer: Medicare HMO

## 2020-10-10 DIAGNOSIS — E559 Vitamin D deficiency, unspecified: Secondary | ICD-10-CM | POA: Diagnosis not present

## 2020-10-10 DIAGNOSIS — E119 Type 2 diabetes mellitus without complications: Secondary | ICD-10-CM | POA: Diagnosis not present

## 2020-10-10 DIAGNOSIS — Z125 Encounter for screening for malignant neoplasm of prostate: Secondary | ICD-10-CM | POA: Diagnosis not present

## 2020-10-10 DIAGNOSIS — E538 Deficiency of other specified B group vitamins: Secondary | ICD-10-CM | POA: Diagnosis not present

## 2020-10-10 DIAGNOSIS — Z0000 Encounter for general adult medical examination without abnormal findings: Secondary | ICD-10-CM

## 2020-10-10 LAB — COMPREHENSIVE METABOLIC PANEL
ALT: 21 U/L (ref 0–53)
AST: 20 U/L (ref 0–37)
Albumin: 4.4 g/dL (ref 3.5–5.2)
Alkaline Phosphatase: 49 U/L (ref 39–117)
BUN: 14 mg/dL (ref 6–23)
CO2: 29 mEq/L (ref 19–32)
Calcium: 9.8 mg/dL (ref 8.4–10.5)
Chloride: 103 mEq/L (ref 96–112)
Creatinine, Ser: 1.09 mg/dL (ref 0.40–1.50)
GFR: 72.22 mL/min (ref >60.00)
Glucose, Bld: 157 mg/dL — ABNORMAL HIGH (ref 70–99)
Potassium: 4.6 mEq/L (ref 3.5–5.1)
Sodium: 138 mEq/L (ref 135–145)
Total Bilirubin: 0.6 mg/dL (ref 0.2–1.2)
Total Protein: 7.3 g/dL (ref 6.0–8.3)

## 2020-10-10 LAB — URINALYSIS, ROUTINE W REFLEX MICROSCOPIC
Bilirubin Urine: NEGATIVE
Hgb urine dipstick: NEGATIVE
Ketones, ur: NEGATIVE
Leukocytes,Ua: NEGATIVE
Nitrite: NEGATIVE
RBC / HPF: NONE SEEN (ref 0–?)
Specific Gravity, Urine: 1.01 (ref 1.000–1.030)
Total Protein, Urine: NEGATIVE
Urine Glucose: 250 — AB
Urobilinogen, UA: 0.2 (ref 0.0–1.0)
WBC, UA: NONE SEEN (ref 0–?)
pH: 6 (ref 5.0–8.0)

## 2020-10-10 LAB — HEMOGLOBIN A1C: Hgb A1c MFr Bld: 7.7 % — ABNORMAL HIGH (ref 4.6–6.5)

## 2020-10-10 LAB — CBC WITH DIFFERENTIAL/PLATELET
Basophils Absolute: 0.1 10*3/uL (ref 0.0–0.1)
Basophils Relative: 0.8 % (ref 0.0–3.0)
Eosinophils Absolute: 0.2 10*3/uL (ref 0.0–0.7)
Eosinophils Relative: 2.6 % (ref 0.0–5.0)
HCT: 42.5 % (ref 39.0–52.0)
Hemoglobin: 14.6 g/dL (ref 13.0–17.0)
Lymphocytes Relative: 26.1 % (ref 12.0–46.0)
Lymphs Abs: 2.1 10*3/uL (ref 0.7–4.0)
MCHC: 34.4 g/dL (ref 30.0–36.0)
MCV: 89.9 fl (ref 78.0–100.0)
Monocytes Absolute: 0.7 10*3/uL (ref 0.1–1.0)
Monocytes Relative: 9 % (ref 3.0–12.0)
Neutro Abs: 5 10*3/uL (ref 1.4–7.7)
Neutrophils Relative %: 61.5 % (ref 43.0–77.0)
Platelets: 215 10*3/uL (ref 150.0–400.0)
RBC: 4.73 Mil/uL (ref 4.22–5.81)
RDW: 14 % (ref 11.5–15.5)
WBC: 8.1 10*3/uL (ref 4.0–10.5)

## 2020-10-10 LAB — MICROALBUMIN / CREATININE URINE RATIO
Creatinine,U: 45.5 mg/dL
Microalb Creat Ratio: 1.5 mg/g (ref 0.0–30.0)
Microalb, Ur: <0.7 mg/dL (ref 0.0–1.9)

## 2020-10-10 LAB — LIPID PANEL
Cholesterol: 212 mg/dL — ABNORMAL HIGH (ref 0–200)
HDL: 35.4 mg/dL — ABNORMAL LOW (ref >39.00)
LDL Cholesterol: 152 mg/dL — ABNORMAL HIGH (ref 0–99)
NonHDL: 176.96
Total CHOL/HDL Ratio: 6
Triglycerides: 125 mg/dL (ref 0.0–149.0)
VLDL: 25 mg/dL (ref 0.0–40.0)

## 2020-10-10 LAB — TSH: TSH: 4.3 u[IU]/mL (ref 0.35–4.50)

## 2020-10-10 LAB — VITAMIN B12: Vitamin B-12: 240 pg/mL (ref 211–911)

## 2020-10-10 LAB — PSA: PSA: 0.94 ng/mL (ref 0.10–4.00)

## 2020-10-10 LAB — VITAMIN D 25 HYDROXY (VIT D DEFICIENCY, FRACTURES): VITD: 38.28 ng/mL (ref 30.00–100.00)

## 2020-10-10 NOTE — Addendum Note (Signed)
Addended by: Trenda Moots on: 12/12/5186 10:16 AM   Modules accepted: Orders

## 2020-10-11 ENCOUNTER — Other Ambulatory Visit: Payer: Self-pay

## 2020-10-12 ENCOUNTER — Encounter: Payer: Self-pay | Admitting: Internal Medicine

## 2020-10-12 ENCOUNTER — Ambulatory Visit (INDEPENDENT_AMBULATORY_CARE_PROVIDER_SITE_OTHER): Payer: Medicare HMO | Admitting: Internal Medicine

## 2020-10-12 ENCOUNTER — Telehealth: Payer: Self-pay

## 2020-10-12 VITALS — BP 152/90 | HR 76 | Temp 98.6°F | Ht 70.0 in | Wt 243.0 lb

## 2020-10-12 DIAGNOSIS — E785 Hyperlipidemia, unspecified: Secondary | ICD-10-CM

## 2020-10-12 DIAGNOSIS — M5441 Lumbago with sciatica, right side: Secondary | ICD-10-CM | POA: Diagnosis not present

## 2020-10-12 DIAGNOSIS — T466X5A Adverse effect of antihyperlipidemic and antiarteriosclerotic drugs, initial encounter: Secondary | ICD-10-CM | POA: Diagnosis not present

## 2020-10-12 DIAGNOSIS — Z0001 Encounter for general adult medical examination with abnormal findings: Secondary | ICD-10-CM

## 2020-10-12 DIAGNOSIS — E1165 Type 2 diabetes mellitus with hyperglycemia: Secondary | ICD-10-CM | POA: Diagnosis not present

## 2020-10-12 DIAGNOSIS — E559 Vitamin D deficiency, unspecified: Secondary | ICD-10-CM

## 2020-10-12 DIAGNOSIS — M5442 Lumbago with sciatica, left side: Secondary | ICD-10-CM | POA: Diagnosis not present

## 2020-10-12 DIAGNOSIS — G8929 Other chronic pain: Secondary | ICD-10-CM

## 2020-10-12 DIAGNOSIS — G720 Drug-induced myopathy: Secondary | ICD-10-CM

## 2020-10-12 DIAGNOSIS — I10 Essential (primary) hypertension: Secondary | ICD-10-CM

## 2020-10-12 DIAGNOSIS — Z0000 Encounter for general adult medical examination without abnormal findings: Secondary | ICD-10-CM

## 2020-10-12 MED ORDER — VICTOZA 18 MG/3ML ~~LOC~~ SOPN: PEN_INJECTOR | SUBCUTANEOUS | 0 refills | Status: DC

## 2020-10-12 MED ORDER — TADALAFIL 20 MG PO TABS: 20.0000 mg | ORAL_TABLET | Freq: Every day | ORAL | 3 refills | Status: DC | PRN

## 2020-10-12 MED ORDER — AMLODIPINE BESYLATE 5 MG PO TABS: 5.0000 mg | ORAL_TABLET | Freq: Every day | ORAL | 3 refills | Status: DC

## 2020-10-12 MED ORDER — VICTOZA 18 MG/3ML ~~LOC~~ SOPN: 1.8000 mg | PEN_INJECTOR | Freq: Every day | SUBCUTANEOUS | 3 refills | Status: DC

## 2020-10-12 MED ORDER — OLMESARTAN MEDOXOMIL 40 MG PO TABS: 40.0000 mg | ORAL_TABLET | Freq: Every day | ORAL | 3 refills | Status: DC

## 2020-10-12 NOTE — Patient Instructions (Addendum)
Please take all new medication as prescribed - the victoza   Ok to start the new Blood Pressure medication when they finally get to you  You will be contacted regarding the referral for: Lipid clinic - for possible medication such as Repatha  Please continue all other medications as before, and refills have been done if requested.  Please have the pharmacy call with any other refills you may need.  Please continue your efforts at being more active, low cholesterol diet, and weight control.  You are otherwise up to date with prevention measures today.  Please keep your appointments with your specialists as you may have planned  Please make an Appointment to return in 6 months, or sooner if needed, also with Lab Appointment for testing done 3-5 days before at the [place] (so this is for TWO appointments - please see the scheduling desk as you leave)  Due to the ongoing Covid 19 pandemic, our lab now requires an appointment for any labs done at our office.  If you need labs done and do not have an appointment, please call our office ahead of time to schedule before presenting to the lab for your testing.

## 2020-10-12 NOTE — Progress Notes (Signed)
Established Patient Office Visit  Subjective:  Patient ID: Keith [name], male    DOB: [birth year]  Age: 65 y.o. MRN: [id number]   BP has been elevated mildly persistently recently  Had seen Dr Alain Marion and rx benicar-amlodipine combination pill, but this was not covered by [employer].  On jan 18 this was changed to amlodpine 5 mg and benicar 40 mg but not taking yet due to not sent yet or approved it seems from [place].  Actually taking losartan 100 mg by doubling what he had left of that per Dr Alain Marion, and has enough on hand to do that for abou 15 wks.  Nurse today will contact [employer] to get the amoldo 5 and benicar 40 mg hopefully coming soon.   BP Readings from Last 3 Encounters:  10/12/20 (!) 152/90  09/13/20 (!) 162/98  09/05/20 (!) 159/99   Did go to [facility] with nose bleed, no packing or ENT needed, only had a trace of bleeding with blowing nose since that time.  No trauma or falls.   Not taking the trulicity due to cost       Chief Complaint:: wellness exam and uncontrolled DM, hHLD, htn, and chronic lbp       HPI:  Keith [name] is a 65 y.o. male here for wellness exam   Wt Readings from Last 3 Encounters:  10/12/20 243 lb (110.2 kg)  09/13/20 238 lb (108 kg)  09/05/20 240 lb (108.9 kg)   BP Readings from Last 3 Encounters:  10/12/20 (!) 152/90  09/13/20 (!) 162/98  09/05/20 (!) 159/99   Immunization History  Administered Date(s) Administered  . Influenza Whole 11/08/2009  . Influenza,inj,Quad PF,6+ Mos 05/12/2013, 08/31/2014, 05/06/2017, 06/24/2018, 05/19/2019  . Influenza-Unspecified 07/19/2015, 06/24/2018  . Pneumococcal Conjugate-13 10/26/2014  . Pneumococcal Polysaccharide-23 05/12/2013  . Td 09/15/1997, 11/02/2008  . Tdap 05/19/2019  There are no preventive care reminders to display for this patient.       Also not checking sugars at home recenly, but willing to start,  Pt denies polydipsia, polyuria,  Did not take BP meds this am yet in rush to get here.  Pt denies chest pain, increased sob or doe, wheezing, orthopnea, PND, increased LE swelling, palpitations, dizziness or syncope.  Has been statin intolerant in past, taking fenofibrate but just not able to do better with lower chol DM diet.  Pt continues to have recurring LBP without change in severity, bowel or bladder change, fever, wt loss,  worsening LE pain/numbness/weakness, gait change or falls, but needs form signed today to certify her is disabled 100 % for purpose of eigibility for half property tax payments per yr to the [employer].    Past Medical History:  Diagnosis Date  . Anxiety   . Arthritis   . Cardiomyopathy    Echo (5/11) showed EF 30% with diffuse hypokinesis, normal wall thickness, mildly decreased RV systolic function. This may be a cardiomyopathy due to hemochromatosis versus tachy-mediated. Unable to get cardiac MRI due to claustrophobia.  . Cervical spine pain    Chronic  . Chest pain    Exercise/adenosine Myoview showed EF 50% (3/10). Inferior hypokinesis. Perfusion images, inferior scar and ischemia in the apical anterior septum and in teh inferior wall . Suggestive of a CD. LHC (3/10) showed only lu minal irregulatrities in the coronaries w/ EF 55%, suggesting that myoview was a false positive, likely from diaphragmatic attnuation.  . Diabetes mellitus   . Diverticulosis   . Fatty liver   .  GERD (gastroesophageal reflux disease)   . Heart murmur   . Hereditary hemochromatosis    Compound heterozygote. Pt gets periodic phlebotomies  . History of colonic polyps   . Hyperlipidemia   . Hypertension   . Low back pain    The pt is S/P spinal fusion surgeries  . Multifocal atrial tachycardia (HCC)    Wandering atrial pacemaker. Holter monior in 5/11 showed frequent runs of symtomatic MAT with heart rate around 100. Pt is now  on amiodarone to try to suppress MAT. PFTs (6/11): FVC 105%, FEV1 109%, TLC 112%, DLCO 87% (minimal obstructive effect). LFTs stable on amiodarone  .  Peptic ulcer disease   . Thyroid nodule    Past Surgical History:  Procedure Laterality Date  . BACK SURGERY  1991   Lumbar  . INGUINAL HERNIA REPAIR    . SPINE SURGERY    . TONSILLECTOMY      reports that he quit smoking about 17 years ago. He has never used smokeless tobacco. He reports current alcohol use of about 1.0 standard drink of alcohol per week. He reports current drug use. family history includes Alcohol abuse in his father; Colitis in his mother; Colon polyps in his mother; Diabetes in his mother, paternal grandfather, and paternal grandmother; Hypertension in his father; Multiple sclerosis in his mother and sister. Allergies  Allergen Reactions  . Lipitor [Atorvastatin]     myalgias  . Metoclopramide Hcl   . Penicillins   . Sertraline Hcl   . Zocor [Simvastatin] Other (See Comments)    Myalgia    Current Outpatient Medications on File Prior to Visit  Medication Sig Dispense Refill  . aspirin 81 MG EC tablet Take 81 mg by mouth daily.    . Cholecalciferol (VITAMIN D-3 PO) Take 1 tablet by mouth daily. PATIENT TAKES ONCE TABLET BY MOUTH ONCE DAILY . PATIENT NOT SURE OF DOSEAGE.    . fenofibrate (TRICOR) 145 MG tablet TAKE 1 TABLET EVERY DAY 90 tablet 3  . glipiZIDE (GLUCOTROL XL) 10 MG 24 hr tablet TAKE 1 TABLET BY MOUTH  DAILY WITH BREAKFAST. NEED APPOINTMENT 90 tablet 1  . glucosamine-chondroitin 500-400 MG tablet Take 1 tablet by mouth 3 (three) times daily.    Marland Kitchen ibuprofen (ADVIL,MOTRIN) 400 MG tablet Take 400 mg by mouth every 6 (six) hours as needed for headache or moderate pain. Reported on 10/30/2015    . levothyroxine (SYNTHROID) 25 MCG tablet TAKE 1 TABLET EVERY DAY (NEED MD APPOINTMENT) 90 tablet 1  . metFORMIN (GLUCOPHAGE-XR) 500 MG 24 hr tablet TAKE 4 TABLETS EVERY DAY WITH BREAKFAST 360 tablet 1  . metoprolol succinate (TOPROL-XL) 100 MG 24 hr tablet TAKE 1 TABLET BY MOUTH TWICE DAILY. NEED APPOINTMENT 180 tablet 1  . Omega-3 Fatty Acids (FISH OIL PO) Take  1 tablet by mouth daily.     . pantoprazole (PROTONIX) 40 MG tablet TAKE 1 TABLET BY MOUTH  DAILY. ANNUAL APPT DUE IN [facility] MUST SEE PROVIDER FOR FUTURE REFILLS 90 tablet 1  . pioglitazone (ACTOS) 45 MG tablet TAKE 1 TABLET BY MOUTH  DAILY. ANNUAL APPT DUE IN [place] MUST SEE PROVIDER FOR FUTURE REFILLS 90 tablet 1  . ALPRAZolam (XANAX) 0.5 MG tablet 1/2 - 1 tab by mouth twice per day as needed (Patient not taking: Reported on 10/12/2020) 60 tablet 2  . diazepam (VALIUM) 5 MG tablet Take 1 tablet (5 mg total) by mouth daily as needed for muscle spasms or sedation. (Patient not taking: Reported on 10/12/2020)  30 tablet 0   No current facility-administered medications on file prior to visit.        ROS:  All others reviewed and negative.  Objective        PE:  BP (!) 152/90 (BP Location: Left Arm, Patient Position: Sitting, Cuff Size: Large)   Pulse 76   Temp 98.6 F (37 C) (Oral)   Ht 5\' 10"  (1.778 m)   Wt 243 lb (110.2 kg)   SpO2 98%   BMI 34.87 kg/m                 Constitutional: Pt appears in NAD               HENT: Head: NCAT.                Right Ear: External ear normal.                 Left Ear: External ear normal.                Eyes: . Pupils are equal, round, and reactive to light. Conjunctivae and EOM are normal               Nose: without d/c or deformity               Neck: Neck supple. Gross normal ROM               Cardiovascular: Normal rate and regular rhythm.                 Pulmonary/Chest: Effort normal and breath sounds without rales or wheezing.                Abd:  Soft, NT, ND, + BS, no organomegaly               Neurological: Pt is alert. At baseline orientation, motor grossly intact               Skin: Skin is warm. No rashes, no other new lesions, LE edema - none               Spine diffuse tender lumbar with scoliosis               Psychiatric: Pt behavior is normal without agitation   Assessment/Plan:  Keith [name] is a 65 y.o. White or Caucasian  [1] male with  has a past medical history of Anxiety, Arthritis, Cardiomyopathy, Cervical spine pain, Chest pain, Diabetes mellitus, Diverticulosis, Fatty liver, GERD (gastroesophageal reflux disease), Heart murmur, Hereditary hemochromatosis, History of colonic polyps, Hyperlipidemia, Hypertension, Low back pain, Multifocal atrial tachycardia ([place]), Peptic ulcer disease, and Thyroid nodule.  Micro: none  Cardiac tracings I have personally interpreted today:  none  Pertinent Radiological findings (summarize): none   Lab Results  Component Value Date   WBC 8.1 10/10/2020   HGB 14.6 10/10/2020   HCT 42.5 10/10/2020   PLT 215.0 10/10/2020   GLUCOSE 157 (H) 10/10/2020   CHOL 212 (H) 10/10/2020   TRIG 125.0 10/10/2020   HDL 35.40 (L) 10/10/2020   LDLDIRECT 127.0 11/15/2019   LDLCALC 152 (H) 10/10/2020   ALT 21 10/10/2020   AST 20 10/10/2020   NA 138 10/10/2020   K 4.6 10/10/2020   CL 103 10/10/2020   CREATININE 1.09 10/10/2020   BUN 14 10/10/2020   CO2 29 10/10/2020   TSH 4.30 10/10/2020   PSA 0.94 10/10/2020   INR 1.1 (H) 07/09/2016  HGBA1C 7.7 (H) 10/10/2020   MICROALBUR <0.7 10/10/2020     Assessment & Plan:   Problem List Items Addressed This Visit      High   Encounter for well adult exam with abnormal findings - Primary    Age and sex appropriate education and counseling updated with regular exercise and diet Referrals for preventative services - none needed Immunizations addressed - none needed Smoking counseling  - none needed Evidence for depression or other mood disorder - none significant Most recent labs reviewed. I have personally reviewed and have noted: 1) the patient's medical and social history 2) The patient's current medications and supplements 3) The patient's height, weight, and BMI have been recorded in the chart         Medium   Statin myopathy    Unable for statin tolerance, for lipid clinic referral      Hypertension, uncontrolled     Pt states BP at home usually < 140/90, though did not take meds this am, I have concern about his overall compliance with treatment, to restart meds, check BP at home for 10 days and call with average      Relevant Medications   amLODipine (NORVASC) 5 MG tablet   olmesartan (BENICAR) 40 MG tablet   Hyperlipidemia    Lab Results  Component Value Date   LDLCALC 152 (H) 10/10/2020   Stable, pt to continue current fenofibrate, but also refer lipid clinic as uncontrolled and statin intolerant       Relevant Medications   amLODipine (NORVASC) 5 MG tablet   olmesartan (BENICAR) 40 MG tablet   Other Relevant Orders   AMB Referral to [facility]   Diabetes [facility])    Lab Results  Component Value Date   HGBA1C 7.7 (H) 10/10/2020   Uncontrolled, to start victoza since trulicity not covered with his insurance,, pt to continue current medical treatment otherwise and diet and wt control  Current Outpatient Medications (Endocrine & Metabolic):  .  glipiZIDE (GLUCOTROL XL) 10 MG 24 hr tablet, TAKE 1 TABLET BY MOUTH  DAILY WITH BREAKFAST. NEED APPOINTMENT .  levothyroxine (SYNTHROID) 25 MCG tablet, TAKE 1 TABLET EVERY DAY (NEED MD APPOINTMENT) .  liraglutide (VICTOZA) 18 MG/3ML SOPN, Start 0.6mg  SQ once a day for 7 days, then increase to 1.2mg  once a day .  liraglutide (VICTOZA) 18 MG/3ML SOPN, Inject 1.8 mg into the skin daily. .  metFORMIN (GLUCOPHAGE-XR) 500 MG 24 hr tablet, TAKE 4 TABLETS EVERY DAY WITH BREAKFAST .  pioglitazone (ACTOS) 45 MG tablet, TAKE 1 TABLET BY MOUTH  DAILY. ANNUAL APPT DUE IN [facility] MUST SEE PROVIDER FOR FUTURE REFILLS  Current Outpatient Medications (Cardiovascular):  .  fenofibrate (TRICOR) 145 MG tablet, TAKE 1 TABLET EVERY DAY .  metoprolol succinate (TOPROL-XL) 100 MG 24 hr tablet, TAKE 1 TABLET BY MOUTH TWICE DAILY. NEED APPOINTMENT .  amLODipine (NORVASC) 5 MG tablet, Take 1 tablet (5 mg total) by mouth daily. Marland Kitchen  olmesartan (BENICAR) 40 MG  tablet, Take 1 tablet (40 mg total) by mouth daily. .  tadalafil (CIALIS) 20 MG tablet, Take 1 tablet (20 mg total) by mouth daily as needed for erectile dysfunction.   Current Outpatient Medications (Analgesics):  .  aspirin 81 MG EC tablet, Take 81 mg by mouth daily. Marland Kitchen  ibuprofen (ADVIL,MOTRIN) 400 MG tablet, Take 400 mg by mouth every 6 (six) hours as needed for headache or moderate pain. Reported on 10/30/2015   Current Outpatient Medications (Other):  .  Cholecalciferol (VITAMIN D-3 PO), Take 1 tablet by mouth daily. PATIENT TAKES ONCE TABLET BY MOUTH ONCE DAILY . PATIENT NOT SURE OF DOSEAGE. .  glucosamine-chondroitin 500-400 MG tablet, Take 1 tablet by mouth 3 (three) times daily. .  Omega-3 Fatty Acids (FISH OIL PO), Take 1 tablet by mouth daily.  .  pantoprazole (PROTONIX) 40 MG tablet, TAKE 1 TABLET BY MOUTH  DAILY. ANNUAL APPT DUE IN [facility] MUST SEE PROVIDER FOR FUTURE REFILLS .  ALPRAZolam (XANAX) 0.5 MG tablet, 1/2 - 1 tab by mouth twice per day as needed (Patient not taking: Reported on 10/12/2020) .  diazepam (VALIUM) 5 MG tablet, Take 1 tablet (5 mg total) by mouth daily as needed for muscle spasms or sedation. (Patient not taking: Reported on 10/12/2020)       Relevant Medications   liraglutide (VICTOZA) 18 MG/3ML SOPN   liraglutide (VICTOZA) 18 MG/3ML SOPN   olmesartan (BENICAR) 40 MG tablet   Other Relevant Orders   Hemoglobin A1c   Lipid panel   Basic metabolic panel   Hepatic function panel   Chronic low back pain    Stable, form signed for tax purpose       Other Visit Diagnoses    Vitamin D deficiency       Relevant Orders   VITAMIN D 25 Hydroxy (Vit-D Deficiency, Fractures)      Meds ordered this encounter  Medications  . liraglutide (VICTOZA) 18 MG/3ML SOPN    Sig: Start 0.6mg  SQ once a day for 7 days, then increase to 1.2mg  once a day    Dispense:  6 mL    Refill:  0  . liraglutide (VICTOZA) 18 MG/3ML SOPN    Sig: Inject 1.8 mg into the skin daily.     Dispense:  9 mL    Refill:  3  . amLODipine (NORVASC) 5 MG tablet    Sig: Take 1 tablet (5 mg total) by mouth daily.    Dispense:  90 tablet    Refill:  3  . olmesartan (BENICAR) 40 MG tablet    Sig: Take 1 tablet (40 mg total) by mouth daily.    Dispense:  90 tablet    Refill:  3    Follow-up: Return in about 6 months (around 04/11/2021).   Cathlean Cower, MD 10/13/2020 2:54 PM [facility]

## 2020-10-12 NOTE — Telephone Encounter (Signed)
Ok done to [facility] 

## 2020-10-12 NOTE — Telephone Encounter (Signed)
Patient would like to increase the tadalafil to the 20mg  tablets.   Please advise

## 2020-10-13 ENCOUNTER — Encounter: Payer: Self-pay | Admitting: Internal Medicine

## 2020-10-13 NOTE — Assessment & Plan Note (Signed)
Lab Results  Component Value Date   HGBA1C 7.7 (H) 10/10/2020   Uncontrolled, to start victoza since trulicity not covered with his insurance,, pt to continue current medical treatment otherwise and diet and wt control  Current Outpatient Medications (Endocrine & Metabolic):  .  glipiZIDE (GLUCOTROL XL) 10 MG 24 hr tablet, TAKE 1 TABLET BY MOUTH  DAILY WITH BREAKFAST. NEED APPOINTMENT .  levothyroxine (SYNTHROID) 25 MCG tablet, TAKE 1 TABLET EVERY DAY (NEED MD APPOINTMENT) .  liraglutide (VICTOZA) 18 MG/3ML SOPN, Start 0.6mg  SQ once a day for 7 days, then increase to 1.2mg  once a day .  liraglutide (VICTOZA) 18 MG/3ML SOPN, Inject 1.8 mg into the skin daily. .  metFORMIN (GLUCOPHAGE-XR) 500 MG 24 hr tablet, TAKE 4 TABLETS EVERY DAY WITH BREAKFAST .  pioglitazone (ACTOS) 45 MG tablet, TAKE 1 TABLET BY MOUTH  DAILY. ANNUAL APPT DUE IN [facility] MUST SEE PROVIDER FOR FUTURE REFILLS  Current Outpatient Medications (Cardiovascular):  .  fenofibrate (TRICOR) 145 MG tablet, TAKE 1 TABLET EVERY DAY .  metoprolol succinate (TOPROL-XL) 100 MG 24 hr tablet, TAKE 1 TABLET BY MOUTH TWICE DAILY. NEED APPOINTMENT .  amLODipine (NORVASC) 5 MG tablet, Take 1 tablet (5 mg total) by mouth daily. Marland Kitchen  olmesartan (BENICAR) 40 MG tablet, Take 1 tablet (40 mg total) by mouth daily. .  tadalafil (CIALIS) 20 MG tablet, Take 1 tablet (20 mg total) by mouth daily as needed for erectile dysfunction.   Current Outpatient Medications (Analgesics):  .  aspirin 81 MG EC tablet, Take 81 mg by mouth daily. Marland Kitchen  ibuprofen (ADVIL,MOTRIN) 400 MG tablet, Take 400 mg by mouth every 6 (six) hours as needed for headache or moderate pain. Reported on 10/30/2015   Current Outpatient Medications (Other):  Marland Kitchen  Cholecalciferol (VITAMIN D-3 PO), Take 1 tablet by mouth daily. PATIENT TAKES ONCE TABLET BY MOUTH ONCE DAILY . PATIENT NOT SURE OF DOSEAGE. .  glucosamine-chondroitin 500-400 MG tablet, Take 1 tablet by mouth 3 (three) times daily. .   Omega-3 Fatty Acids (FISH OIL PO), Take 1 tablet by mouth daily.  .  pantoprazole (PROTONIX) 40 MG tablet, TAKE 1 TABLET BY MOUTH  DAILY. ANNUAL APPT DUE IN [facility] MUST SEE PROVIDER FOR FUTURE REFILLS .  ALPRAZolam (XANAX) 0.5 MG tablet, 1/2 - 1 tab by mouth twice per day as needed (Patient not taking: Reported on 10/12/2020) .  diazepam (VALIUM) 5 MG tablet, Take 1 tablet (5 mg total) by mouth daily as needed for muscle spasms or sedation. (Patient not taking: Reported on 10/12/2020)

## 2020-10-13 NOTE — Assessment & Plan Note (Signed)
Stable, form signed for tax purpose

## 2020-10-13 NOTE — Assessment & Plan Note (Signed)
Unable for statin tolerance, for lipid clinic referral

## 2020-10-13 NOTE — Assessment & Plan Note (Addendum)
Lab Results  Component Value Date   LDLCALC 152 (H) 10/10/2020   Stable, pt to continue current fenofibrate, but also refer lipid clinic as uncontrolled and statin intolerant

## 2020-10-13 NOTE — Assessment & Plan Note (Signed)
Pt states BP at home usually < 140/90, though did not take meds this am, I have concern about his overall compliance with treatment, to restart meds, check BP at home for 10 days and call with average

## 2020-10-13 NOTE — Assessment & Plan Note (Signed)

## 2020-10-30 ENCOUNTER — Other Ambulatory Visit: Payer: Self-pay

## 2020-11-01 ENCOUNTER — Other Ambulatory Visit: Payer: Self-pay

## 2020-11-02 ENCOUNTER — Telehealth: Payer: Self-pay | Admitting: Internal Medicine

## 2020-11-02 NOTE — Telephone Encounter (Signed)
Error

## 2020-11-22 ENCOUNTER — Ambulatory Visit: Payer: Medicare HMO | Admitting: Internal Medicine

## 2020-11-23 ENCOUNTER — Telehealth: Payer: Self-pay | Admitting: Internal Medicine

## 2020-11-23 ENCOUNTER — Other Ambulatory Visit: Payer: Self-pay

## 2020-11-23 MED ORDER — VICTOZA 18 MG/3ML ~~LOC~~ SOPN: 1.8000 mg | PEN_INJECTOR | Freq: Every day | SUBCUTANEOUS | 3 refills | Status: DC

## 2020-11-23 NOTE — Telephone Encounter (Signed)
Patient calling to clarify dosage for liraglutide (VICTOZA) 18 MG/3ML SOPN  liraglutide (VICTOZA) 18 MG/3ML SOPN

## 2020-11-28 NOTE — Telephone Encounter (Signed)
Patient calling to clarify dosage of Victoza. Please call

## 2020-11-28 NOTE — Telephone Encounter (Signed)
This is really confusing since it clearly says on he rx -   liraglutide (VICTOZA) 18 MG/3ML SOPN [[id number]]    Order Details Dose, Route, Frequency: As Directed  Dispense Quantity: 6 mL Refills: 0   Indications of Use: Type 2 Diabetes Mellitus       Sig: Start 0.6mg  SQ once a day for 7 days, then increase to 1.2mg  once a day       Start Date: 10/12/20 End Date: --  Written Date: 10/12/20 Expiration Date: 10/12/21   Providers  Authorizing Provider:   Biagio Borg, [address], [place] [address]  Phone:  [phone]   Fax:  [phone]  DEA #:  [id number]   NPI:  [id number]      Ordering User:  Biagio Borg, [place] [place]  [place], [place] [address]  Phone:  [phone]  Fax:  [phone]  DEA #:  --  DAW Reason: --

## 2020-11-30 NOTE — Telephone Encounter (Signed)
Patient Notified

## 2020-12-06 DIAGNOSIS — R3912 Poor urinary stream: Secondary | ICD-10-CM | POA: Diagnosis not present

## 2020-12-06 DIAGNOSIS — N401 Enlarged prostate with lower urinary tract symptoms: Secondary | ICD-10-CM | POA: Diagnosis not present

## 2021-01-16 ENCOUNTER — Other Ambulatory Visit: Payer: Self-pay | Admitting: Internal Medicine

## 2021-01-16 NOTE — Telephone Encounter (Signed)
Please refill as per office routine med refill policy (all routine meds refilled for 3 mo or monthly per pt preference up to one year from last visit, then month to month grace period for 3 mo, then further med refills will have to be denied)  

## 2021-04-07 ENCOUNTER — Encounter: Payer: Self-pay | Admitting: Internal Medicine

## 2021-04-15 ENCOUNTER — Other Ambulatory Visit (INDEPENDENT_AMBULATORY_CARE_PROVIDER_SITE_OTHER): Payer: Medicare HMO

## 2021-04-15 DIAGNOSIS — E559 Vitamin D deficiency, unspecified: Secondary | ICD-10-CM | POA: Diagnosis not present

## 2021-04-15 DIAGNOSIS — E1165 Type 2 diabetes mellitus with hyperglycemia: Secondary | ICD-10-CM

## 2021-04-15 LAB — BASIC METABOLIC PANEL
BUN: 18 mg/dL (ref 6–23)
CO2: 23 mEq/L (ref 19–32)
Calcium: 9.4 mg/dL (ref 8.4–10.5)
Chloride: 102 mEq/L (ref 96–112)
Creatinine, Ser: 1.27 mg/dL (ref 0.40–1.50)
GFR: 59.9 mL/min — ABNORMAL LOW (ref >60.00)
Glucose, Bld: 244 mg/dL — ABNORMAL HIGH (ref 70–99)
Potassium: 4.1 mEq/L (ref 3.5–5.1)
Sodium: 135 mEq/L (ref 135–145)

## 2021-04-15 LAB — HEMOGLOBIN A1C: Hgb A1c MFr Bld: 7 % — ABNORMAL HIGH (ref 4.6–6.5)

## 2021-04-15 LAB — LIPID PANEL
Cholesterol: 195 mg/dL (ref 0–200)
HDL: 33.5 mg/dL — ABNORMAL LOW (ref >39.00)
LDL Cholesterol: 130 mg/dL — ABNORMAL HIGH (ref 0–99)
NonHDL: 161.47
Total CHOL/HDL Ratio: 6
Triglycerides: 159 mg/dL — ABNORMAL HIGH (ref 0.0–149.0)
VLDL: 31.8 mg/dL (ref 0.0–40.0)

## 2021-04-15 LAB — HEPATIC FUNCTION PANEL
ALT: 21 U/L (ref 0–53)
AST: 17 U/L (ref 0–37)
Albumin: 4.3 g/dL (ref 3.5–5.2)
Alkaline Phosphatase: 42 U/L (ref 39–117)
Bilirubin, Direct: 0.1 mg/dL (ref 0.0–0.3)
Total Bilirubin: 0.5 mg/dL (ref 0.2–1.2)
Total Protein: 7.4 g/dL (ref 6.0–8.3)

## 2021-04-15 LAB — VITAMIN D 25 HYDROXY (VIT D DEFICIENCY, FRACTURES): VITD: 44.03 ng/mL (ref 30.00–100.00)

## 2021-04-17 ENCOUNTER — Other Ambulatory Visit: Payer: Self-pay

## 2021-04-17 ENCOUNTER — Ambulatory Visit (INDEPENDENT_AMBULATORY_CARE_PROVIDER_SITE_OTHER): Payer: Medicare HMO | Admitting: Internal Medicine

## 2021-04-17 ENCOUNTER — Encounter: Payer: Self-pay | Admitting: Internal Medicine

## 2021-04-17 VITALS — BP 130/76 | HR 90 | Temp 98.1°F | Ht 70.0 in | Wt 242.0 lb

## 2021-04-17 DIAGNOSIS — E785 Hyperlipidemia, unspecified: Secondary | ICD-10-CM

## 2021-04-17 DIAGNOSIS — E538 Deficiency of other specified B group vitamins: Secondary | ICD-10-CM | POA: Diagnosis not present

## 2021-04-17 DIAGNOSIS — I10 Essential (primary) hypertension: Secondary | ICD-10-CM | POA: Diagnosis not present

## 2021-04-17 DIAGNOSIS — Z0000 Encounter for general adult medical examination without abnormal findings: Secondary | ICD-10-CM

## 2021-04-17 DIAGNOSIS — E1165 Type 2 diabetes mellitus with hyperglycemia: Secondary | ICD-10-CM | POA: Diagnosis not present

## 2021-04-17 DIAGNOSIS — E559 Vitamin D deficiency, unspecified: Secondary | ICD-10-CM | POA: Diagnosis not present

## 2021-04-17 MED ORDER — TRULICITY 1.5 MG/0.5ML ~~LOC~~ SOAJ: 1.5000 mg | SUBCUTANEOUS | 3 refills | Status: DC

## 2021-04-17 MED ORDER — TADALAFIL 20 MG PO TABS: 20.0000 mg | ORAL_TABLET | Freq: Every day | ORAL | 11 refills | Status: AC | PRN

## 2021-04-17 NOTE — Patient Instructions (Signed)
Please call if you would like to have the Cardiac CT Score testing done  [facility] to change the victoza to the trulicity  Please continue all other medications as before, and refills have been done if requested.  Please have the pharmacy call with any other refills you may need.  Please continue your efforts at being more active, low cholesterol diet, and weight control.  Please keep your appointments with your specialists as you may have planned  Please make an Appointment to return in 6 months, or sooner if needed, also with Lab Appointment for testing done 3-5 days before at the [facility] (so this is for TWO appointments - please see the scheduling desk as you leave)  Due to the ongoing Covid 19 pandemic, our lab now requires an appointment for any labs done at our office.  If you need labs done and do not have an appointment, please call our office ahead of time to schedule before presenting to the lab for your testing.

## 2021-04-17 NOTE — Progress Notes (Signed)
Patient ID: Keith Keith [name], male   DOB: 07-21-1956, 65 y.o.   MRN: [id number]        Chief Complaint: follow up HTN, HLD and hyperglycemia       HPI:  Keith Keith [name] is a 65 y.o. male here overall doing ok but has lack of insurance coverage for victoza, and would prefer the daily Keith [name] the weekly trulicity better covered by insurance as well;  Pt denies chest pain, increased sob or doe, wheezing, orthopnea, PND, increased LE swelling, palpitations, dizziness or syncope.   Pt denies polydipsia, polyuria, or new focal neuro s/s.   Pt denies fever, wt loss, night sweats, loss of appetite, or other constitutional symptoms  Pt is interested in Cardiac Ct Score.  No other new complaints     Wt Readings from Last 3 Encounters:  04/17/21 242 lb (109.8 kg)  10/12/20 243 lb (110.2 kg)  09/13/20 238 lb (108 kg)   BP Readings from Last 3 Encounters:  04/17/21 130/76  10/12/20 (!) 152/90  09/13/20 (!) 162/98         Past Medical History:  Diagnosis Date   Anxiety    Arthritis    Cardiomyopathy    Echo (5/11) showed EF 30% with diffuse hypokinesis, normal wall thickness, mildly decreased RV systolic function. This may be a cardiomyopathy due Keith [name] hemochromatosis versus tachy-mediated. Unable Keith [name] get cardiac MRI due Keith [name] claustrophobia.   Cervical spine pain    Chronic   Chest pain    Exercise/adenosine Myoview showed EF 50% (3/10). Inferior hypokinesis. Perfusion images, inferior scar and ischemia in the apical anterior septum and in teh inferior wall . Suggestive of a CD. LHC (3/10) showed only lu minal irregulatrities in the coronaries w/ EF 55%, suggesting that myoview was a false positive, likely from diaphragmatic attnuation.   Diabetes mellitus    Diverticulosis    Fatty liver    GERD (gastroesophageal reflux disease)    Heart murmur    Hereditary hemochromatosis    Compound heterozygote. Pt gets periodic phlebotomies   History of colonic polyps    Hyperlipidemia    Hypertension    Low  back pain    The pt is S/P spinal fusion surgeries   Multifocal atrial tachycardia (HCC)    Wandering atrial pacemaker. Holter monior in 5/11 showed frequent runs of symtomatic MAT with heart rate around 100. Pt is now  on amiodarone Keith [name] try Keith [name] suppress MAT. PFTs (6/11): FVC 105%, FEV1 109%, TLC 112%, DLCO 87% (minimal obstructive effect). LFTs stable on amiodarone   Peptic ulcer disease    Thyroid nodule    Past Surgical History:  Procedure Laterality Date   BACK SURGERY  1991   Lumbar   INGUINAL HERNIA REPAIR     SPINE SURGERY     TONSILLECTOMY      reports that he quit smoking about 17 years ago. He has never used smokeless tobacco. He reports current alcohol use of about 1.0 standard drink of alcohol per week. He reports current drug use. family history includes Alcohol abuse in his father; Colitis in his mother; Colon polyps in his mother; Diabetes in his mother, paternal grandfather, and paternal grandmother; Hypertension in his father; Multiple sclerosis in his mother and sister. Allergies  Allergen Reactions   Lipitor [Atorvastatin]     myalgias   Metoclopramide Hcl    Penicillins    Sertraline Hcl    Zocor [Simvastatin] Other (See Comments)    Myalgia    Current Outpatient  Medications on File Prior Keith [name] Visit  Medication Sig Dispense Refill   ALPRAZolam (XANAX) 0.5 MG tablet 1/2 - 1 tab by mouth twice per day as needed 60 tablet 2   amLODipine (NORVASC) 5 MG tablet Take 1 tablet (5 mg total) by mouth daily. 90 tablet 3   aspirin 81 MG EC tablet Take 81 mg by mouth daily.     Cholecalciferol (VITAMIN D-3 PO) Take 1 tablet by mouth daily. PATIENT TAKES ONCE TABLET BY MOUTH ONCE DAILY . PATIENT NOT SURE OF DOSEAGE.     diazepam (VALIUM) 5 MG tablet Take 1 tablet (5 mg total) by mouth daily as needed for muscle spasms or sedation. 30 tablet 0   fenofibrate (TRICOR) 145 MG tablet TAKE 1 TABLET EVERY DAY 90 tablet 3   glipiZIDE (GLUCOTROL XL) 10 MG 24 hr tablet TAKE 1 TABLET BY  MOUTH DAILY WITH BREAKFAST. NEED APPOINTMENT. 90 tablet 1   glucosamine-chondroitin 500-400 MG tablet Take 1 tablet by mouth 3 (three) times daily.     ibuprofen (ADVIL,MOTRIN) 400 MG tablet Take 400 mg by mouth every 6 (six) hours as needed for headache or moderate pain. Reported on 10/30/2015     levothyroxine (SYNTHROID) 25 MCG tablet TAKE 1 TABLET EVERY DAY (NEED MD APPOINTMENT) 90 tablet 1   metFORMIN (GLUCOPHAGE-XR) 500 MG 24 hr tablet TAKE 4 TABLETS EVERY DAY WITH BREAKFAST 360 tablet 1   metoprolol succinate (TOPROL-XL) 100 MG 24 hr tablet TAKE 1 TABLET BY MOUTH TWICE DAILY. NEED APPOINTMENT. 180 tablet 1   olmesartan (BENICAR) 40 MG tablet Take 1 tablet (40 mg total) by mouth daily. 90 tablet 3   Omega-3 Fatty Acids (FISH OIL PO) Take 1 tablet by mouth daily.      pantoprazole (PROTONIX) 40 MG tablet TAKE 1 TABLET BY MOUTH DAILY. ANNUAL APPOINTMENT DUE IN [facility]. MUST SEE PROVIDER FOR FUTURE REFILLS. 90 tablet 1   pioglitazone (ACTOS) 45 MG tablet TAKE 1 TABLET BY MOUTH DAILY. ANNUAL APPT DUE IN MARCH, MUST SEE PROVIDER FOR FUTURE REFILLS. 90 tablet 1   DROPLET PEN NEEDLES 32G X 4 MM MISC      No current facility-administered medications on file prior Keith [name] visit.        ROS:  All others reviewed and negative.  Objective        PE:  BP 130/76 (BP Location: Left Arm, Patient Position: Sitting, Cuff Size: Large)   Pulse 90   Temp 98.1 F (36.7 C) (Oral)   Ht '5\' 10"'$  (1.778 m)   Wt 242 lb (109.8 kg)   SpO2 97%   BMI 34.72 kg/m                 Constitutional: Pt appears in NAD               HENT: Head: NCAT.                Right Ear: External ear normal.                 Left Ear: External ear normal.                Eyes: . Pupils are equal, round, and reactive Keith [name] light. Conjunctivae and EOM are normal               Nose: without d/c or deformity               Neck: Neck supple. Gross normal ROM  Cardiovascular: Normal rate and regular rhythm.                  Pulmonary/Chest: Effort normal and breath sounds without rales or wheezing.                Abd:  Soft, NT, ND, + BS, no organomegaly               Neurological: Pt is alert. At baseline orientation, motor grossly intact               Skin: Skin is warm. No rashes, no other new lesions, LE edema - none               Psychiatric: Pt behavior is normal without agitation   Micro: none  Cardiac tracings I have personally interpreted today:  none  Pertinent Radiological findings (summarize): none   Lab Results  Component Value Date   WBC 8.1 10/10/2020   HGB 14.6 10/10/2020   HCT 42.5 10/10/2020   PLT 215.0 10/10/2020   GLUCOSE 244 (H) 04/15/2021   CHOL 195 04/15/2021   TRIG 159.0 (H) 04/15/2021   HDL 33.50 (L) 04/15/2021   LDLDIRECT 127.0 11/15/2019   LDLCALC 130 (H) 04/15/2021   ALT 21 04/15/2021   AST 17 04/15/2021   NA 135 04/15/2021   K 4.1 04/15/2021   CL 102 04/15/2021   CREATININE 1.27 04/15/2021   BUN 18 04/15/2021   CO2 23 04/15/2021   TSH 4.30 10/10/2020   PSA 0.94 10/10/2020   INR 1.1 (H) 07/09/2016   HGBA1C 7.0 (H) 04/15/2021   MICROALBUR <0.7 10/10/2020   Assessment/Plan:  Keith Keith [name] is a 65 y.o. White or Caucasian [1] male with  has a past medical history of Anxiety, Arthritis, Cardiomyopathy, Cervical spine pain, Chest pain, Diabetes mellitus, Diverticulosis, Fatty liver, GERD (gastroesophageal reflux disease), Heart murmur, Hereditary hemochromatosis, History of colonic polyps, Hyperlipidemia, Hypertension, Low back pain, Multifocal atrial tachycardia ([place]), Peptic ulcer disease, and Thyroid nodule.  Hypertension, uncontrolled BP Readings from Last 3 Encounters:  04/17/21 130/76  10/12/20 (!) 152/90  09/13/20 (!) 162/98   Stable, pt Keith [name] continue medical treatment norvasc, toprol, benicar   Hyperlipidemia Lab Results  Component Value Date   LDLCALC 130 (H) 04/15/2021   Uncontrolled, goal ldl < 70, now on tricor as statin intolerant, pt Keith [name]  continue current diet and tx, also for cardiac Ct Score, consider referral Keith [name] lipid clinic if abnormal    Diabetes Lab Results  Component Value Date   HGBA1C 7.0 (H) 04/15/2021   Mild uncontrolled, goal A1c < 7, Keith [name] change victoza Keith [name] trulicity 1.5, cont diet and f/u lab next visti  Followup: Return in about 6 months (around 10/18/2021).  Cathlean Cower, MD 04/20/2021 7:34 PM [facility]

## 2021-04-20 ENCOUNTER — Encounter: Payer: Self-pay | Admitting: Internal Medicine

## 2021-04-20 NOTE — Assessment & Plan Note (Signed)
BP Readings from Last 3 Encounters:  04/17/21 130/76  10/12/20 (!) 152/90  09/13/20 (!) 162/98   Stable, pt to continue medical treatment norvasc, toprol, benicar

## 2021-04-20 NOTE — Assessment & Plan Note (Signed)
Lab Results  Component Value Date   HGBA1C 7.0 (H) 04/15/2021   Mild uncontrolled, goal A1c < 7, to change victoza to trulicity 1.5, cont diet and f/u lab next visti

## 2021-04-20 NOTE — Assessment & Plan Note (Signed)
Lab Results  Component Value Date   LDLCALC 130 (H) 04/15/2021   Uncontrolled, goal ldl < 70, now on tricor as statin intolerant, pt to continue current diet and tx, also for cardiac Ct Score, consider referral to lipid clinic if abnormal

## 2021-05-24 ENCOUNTER — Encounter: Payer: Self-pay | Admitting: Internal Medicine

## 2021-06-04 ENCOUNTER — Encounter: Payer: Self-pay | Admitting: Gastroenterology

## 2021-06-04 ENCOUNTER — Ambulatory Visit: Payer: Medicare HMO | Admitting: Gastroenterology

## 2021-06-04 ENCOUNTER — Other Ambulatory Visit (INDEPENDENT_AMBULATORY_CARE_PROVIDER_SITE_OTHER): Payer: Medicare HMO

## 2021-06-04 DIAGNOSIS — Z794 Long term (current) use of insulin: Secondary | ICD-10-CM

## 2021-06-04 DIAGNOSIS — R1032 Left lower quadrant pain: Secondary | ICD-10-CM | POA: Diagnosis not present

## 2021-06-04 DIAGNOSIS — E119 Type 2 diabetes mellitus without complications: Secondary | ICD-10-CM | POA: Diagnosis not present

## 2021-06-04 DIAGNOSIS — Z1211 Encounter for screening for malignant neoplasm of colon: Secondary | ICD-10-CM | POA: Diagnosis not present

## 2021-06-04 LAB — CBC
HCT: 43.1 % (ref 39.0–52.0)
Hemoglobin: 14.7 g/dL (ref 13.0–17.0)
MCHC: 34 g/dL (ref 30.0–36.0)
MCV: 91.8 fl (ref 78.0–100.0)
Platelets: 221 10*3/uL (ref 150.0–400.0)
RBC: 4.69 Mil/uL (ref 4.22–5.81)
RDW: 14 % (ref 11.5–15.5)
WBC: 8.4 10*3/uL (ref 4.0–10.5)

## 2021-06-04 LAB — IBC PANEL
Iron: 153 ug/dL (ref 42–165)
Saturation Ratios: 38.9 % (ref 20.0–50.0)
TIBC: 393.4 ug/dL (ref 250.0–450.0)
Transferrin: 281 mg/dL (ref 212.0–360.0)

## 2021-06-04 LAB — FERRITIN: Ferritin: 76.2 ng/mL (ref 22.0–322.0)

## 2021-06-04 MED ORDER — CIPROFLOXACIN HCL 500 MG PO TABS: 500.0000 mg | ORAL_TABLET | Freq: Two times a day (BID) | ORAL | 0 refills | Status: AC

## 2021-06-04 MED ORDER — METRONIDAZOLE 500 MG PO TABS: 500.0000 mg | ORAL_TABLET | Freq: Three times a day (TID) | ORAL | 0 refills | Status: AC

## 2021-06-04 MED ORDER — SUTAB 1479-225-188 MG PO TABS: 1.0000 | ORAL_TABLET | ORAL | 0 refills | Status: DC

## 2021-06-04 NOTE — Patient Instructions (Signed)
If you are age 64 or older, your body mass index should be between 23-30. Your Body mass index is 35.09 kg/m. If this is out of the aforementioned range listed, please consider follow up with your Primary Care Provider. __________________________________________________________  The [facility] providers would like to encourage you to use [facility] to communicate with providers for non-urgent requests or questions.  Due to long hold times on the telephone, sending your provider a message by [facility] may be a faster and more efficient way to get a response.  Please allow 48 business hours for a response.  Please remember that this is for non-urgent requests.   You have been scheduled for a colonoscopy. Please follow written instructions given to you at your visit today.  Please pick up your prep supplies at the pharmacy within the next 1-3 days. If you use inhalers (even only as needed), please bring them with you on the day of your procedure.  Your provider has requested that you go to the basement level for lab work before leaving today. Press "B" on the elevator. The lab is located at the first door on the left as you exit the elevator.  START Cipro 500 mg 1 tablet twice daily for 1 week and Flagyl 500 mg 1 tablet threes times daily for 1 week.  Follow up as needed for now.  Thank you for entrusting me with your care and choosing [facility].  Alonza Bogus, PA-C

## 2021-06-04 NOTE — Progress Notes (Signed)
06/04/2021 Keith [name] [id number] Jan 20, 1956   HISTORY OF PRESENT ILLNESS: This is a 65 year old male who is a patient Dr. Vena Rua.  He usually follows here with Dr. Hilarie Fredrickson for his hemochromatosis, but has not been seen here since 2017.  It looks like he has not had any iron studies drawn since 2020.  He is heterozygote for C282Y and heterozygote for H63D with iron overload in the past requiring phlebotomy.  He also would like to schedule his colonoscopy.  His last was in February 2012 by Dr. Sharlett Iles at which time he was found to have diverticulosis and some hyperplastic polyps in his rectum.  He says that he moves his bowels well.  No rectal bleeding.  He tells me that he has been having abdominal pain for the past 5 or 6 weeks, however.  He says that it initially started out on the left lower side, but then progressed and was hurting over his entire abdomen.  He thought that it was due to the Victoza that he was taking.  He did get a lot of improvement after discontinuing the Victoza several weeks ago, but he still has some pretty localized left lower quadrant abdominal pain.  He denies any associated nausea, vomiting, fevers, chills.   Past Medical History:  Diagnosis Date   Anxiety    Arthritis    Cardiomyopathy    Echo (5/11) showed EF 30% with diffuse hypokinesis, normal wall thickness, mildly decreased RV systolic function. This may be a cardiomyopathy due to hemochromatosis versus tachy-mediated. Unable to get cardiac MRI due to claustrophobia.   Cervical spine pain    Chronic   Chest pain    Exercise/adenosine Myoview showed EF 50% (3/10). Inferior hypokinesis. Perfusion images, inferior scar and ischemia in the apical anterior septum and in teh inferior wall . Suggestive of a CD. LHC (3/10) showed only lu minal irregulatrities in the coronaries w/ EF 55%, suggesting that myoview was a false positive, likely from diaphragmatic attnuation.   Diabetes mellitus     Diverticulosis    Fatty liver    GERD (gastroesophageal reflux disease)    Heart murmur    Hereditary hemochromatosis    Compound heterozygote. Pt gets periodic phlebotomies   History of colonic polyps    Hyperlipidemia    Hypertension    Low back pain    The pt is S/P spinal fusion surgeries   Multifocal atrial tachycardia (HCC)    Wandering atrial pacemaker. Holter monior in 5/11 showed frequent runs of symtomatic MAT with heart rate around 100. Pt is now  on amiodarone to try to suppress MAT. PFTs (6/11): FVC 105%, FEV1 109%, TLC 112%, DLCO 87% (minimal obstructive effect). LFTs stable on amiodarone   Peptic ulcer disease    Thyroid nodule    Past Surgical History:  Procedure Laterality Date   INGUINAL HERNIA REPAIR Right    LUMBAR [place] SURGERY  09/15/1989   TONSILLECTOMY      reports that he quit smoking about 17 years ago. His smoking use included cigarettes. He has never used smokeless tobacco. He reports current alcohol use of about 1.0 standard drink per week. He reports current drug use. family history includes Alcohol abuse in his father; Colitis in his mother; Colon polyps in his mother; Diabetes in his mother, paternal grandfather, and paternal grandmother; Hypertension in his father; Multiple sclerosis in his mother and sister. Allergies  Allergen Reactions   Lipitor [Atorvastatin]     myalgias   Penicillins  Reglan [Metoclopramide]    Zocor [Simvastatin] Other (See Comments)    Myalgia    Zoloft [Sertraline Hcl]       Outpatient Encounter Medications as of 06/04/2021  Medication Sig   ALPRAZolam (XANAX) 0.5 MG tablet 1/2 - 1 tab by mouth twice per day as needed   amLODipine (NORVASC) 5 MG tablet Take 1 tablet (5 mg total) by mouth daily.   aspirin 81 MG EC tablet Take 81 mg by mouth daily.   Cholecalciferol (VITAMIN D-3 PO) Take 1 tablet by mouth daily. PATIENT TAKES ONCE TABLET BY MOUTH ONCE DAILY . PATIENT NOT SURE OF DOSEAGE.   diazepam (VALIUM) 5 MG  tablet Take 1 tablet (5 mg total) by mouth daily as needed for muscle spasms or sedation.   DROPLET PEN NEEDLES 32G X 4 MM MISC    fenofibrate (TRICOR) 145 MG tablet TAKE 1 TABLET EVERY DAY   glipiZIDE (GLUCOTROL XL) 10 MG 24 hr tablet TAKE 1 TABLET BY MOUTH DAILY WITH BREAKFAST. NEED APPOINTMENT.   glucosamine-chondroitin 500-400 MG tablet Take 1 tablet by mouth 3 (three) times daily.   ibuprofen (ADVIL,MOTRIN) 400 MG tablet Take 400 mg by mouth every 6 (six) hours as needed for headache or moderate pain. Reported on 10/30/2015   levothyroxine (SYNTHROID) 25 MCG tablet TAKE 1 TABLET EVERY DAY (NEED MD APPOINTMENT)   metFORMIN (GLUCOPHAGE-XR) 500 MG 24 hr tablet TAKE 4 TABLETS EVERY DAY WITH BREAKFAST   metoprolol succinate (TOPROL-XL) 100 MG 24 hr tablet TAKE 1 TABLET BY MOUTH TWICE DAILY. NEED APPOINTMENT.   olmesartan (BENICAR) 40 MG tablet Take 1 tablet (40 mg total) by mouth daily.   Omega-3 Fatty Acids (FISH OIL PO) Take 1 tablet by mouth daily.    pantoprazole (PROTONIX) 40 MG tablet TAKE 1 TABLET BY MOUTH DAILY. ANNUAL APPOINTMENT DUE IN [facility] MUST SEE PROVIDER FOR FUTURE REFILLS.   pioglitazone (ACTOS) 45 MG tablet TAKE 1 TABLET BY MOUTH DAILY. ANNUAL APPT DUE IN MARCH, MUST SEE PROVIDER FOR FUTURE REFILLS.   tadalafil (CIALIS) 20 MG tablet Take 1 tablet (20 mg total) by mouth daily as needed for erectile dysfunction.   [DISCONTINUED] Dulaglutide (TRULICITY) 1.5 AY/3.0ZS SOPN Inject 1.5 mg into the skin once a week.   No facility-administered encounter medications on file as of 06/04/2021.     REVIEW OF SYSTEMS  : All other systems reviewed and negative except where noted in the History of Present Illness.   PHYSICAL EXAM: BP 132/88 (BP Location: Left Arm, Patient Position: Sitting, Cuff Size: Normal)   Pulse 76   Ht 5' 9.69" (1.77 m) Comment: height measured without shoes  Wt 242 lb 6 oz (109.9 kg)   BMI 35.09 kg/m  General: Well developed white male in no acute distress Head:  Normocephalic and atraumatic Eyes:  Sclerae anicteric, conjunctiva pink. Ears: Normal auditory acuity Lungs: Clear throughout to auscultation; No W/R/R. Heart: Regular rate and rhythm; no M/R/G. Abdomen: Soft, non-distended.  BS present.  LLQ TTP. Rectal:  Will be done at the time of colonoscopy. Musculoskeletal: Symmetrical with no gross deformities  Skin: No lesions on visible extremities Extremities: No edema  Neurological: Alert oriented x 4, grossly non-focal Psychological:  Alert and cooperative. Normal mood and affect  ASSESSMENT AND PLAN: *Left lower quadrant abdominal pain: He does have history of diverticulosis, but has never been treated for diverticulitis in the past.  He said that this pain has been present for little over a month and initially he thought it was associated  with the Victoza, but he has been off of that for quite some time now and he still has pretty localized left lower quadrant abdominal pain and is tender on exam.  I am going to empirically treat him with a 1 week course of Cipro and Flagyl, Cipro 500 mg twice a day and Flagyl 500 mg 3 times a day.  Would like him to call and let us know how he is feeling upon completion of the antibiotics. *Hemochromatosis: He is heterozygote for C282Y and heterozygote for H63D with iron overload in the past requiring phlebotomy.  In 2017 there was no evidence of fibrosis with a hepatic elastography fibrosis at F0.  I do not see any iron studies in almost 2 years.  It been 5 years since he has been seen here.  We will check CBC, ferritin/iron studies today.  Would resume phlebotomy if ferritin is greater than 100. *CRC screening:  Last colonoscopy 2012.  Will schedule with Dr. Hilarie Fredrickson. *IDDM:  Insulin will be adjusted prior to endoscopic procedure per protocol. Will resume normal dosing after procedure.   **The risks, benefits, and alternatives to colonoscopy were discussed with the patient and he consents to proceed.   CC:  Biagio Borg, MD

## 2021-06-07 NOTE — Progress Notes (Signed)
Addendum: Reviewed and agree with assessment and management plan. Pebble Botkin M, MD  

## 2021-06-24 DIAGNOSIS — Z20828 Contact with and (suspected) exposure to other viral communicable diseases: Secondary | ICD-10-CM | POA: Diagnosis not present

## 2021-06-24 DIAGNOSIS — R5383 Other fatigue: Secondary | ICD-10-CM | POA: Diagnosis not present

## 2021-06-24 DIAGNOSIS — J189 Pneumonia, unspecified organism: Secondary | ICD-10-CM | POA: Diagnosis not present

## 2021-06-24 DIAGNOSIS — R051 Acute cough: Secondary | ICD-10-CM | POA: Diagnosis not present

## 2021-07-06 ENCOUNTER — Other Ambulatory Visit: Payer: Self-pay | Admitting: Internal Medicine

## 2021-07-06 NOTE — Telephone Encounter (Signed)
Please refill as per office routine med refill policy (all routine meds to be refilled for 3 mo or monthly (per pt preference) up to one year from last visit, then month to month grace period for 3 mo, then further med refills will have to be denied) ? ?

## 2021-07-11 ENCOUNTER — Other Ambulatory Visit: Payer: Self-pay | Admitting: Internal Medicine

## 2021-07-11 MED ORDER — RELION TRUE METRIX TEST STRIPS VI STRP: ORAL_STRIP | 12 refills | Status: AC

## 2021-07-11 MED ORDER — TRUE METRIX LEVEL 1 LOW VI SOLN: 3 refills | Status: AC

## 2021-07-11 MED ORDER — TRUEPLUS LANCETS 33G MISC: 12 refills | Status: AC

## 2021-07-11 MED ORDER — BD SWAB SINGLE USE REGULAR PADS: MEDICATED_PAD | 3 refills | Status: AC

## 2021-07-11 MED ORDER — TRUE METRIX METER W/DEVICE KIT: PACK | 0 refills | Status: DC

## 2021-07-25 ENCOUNTER — Encounter: Payer: Self-pay | Admitting: Internal Medicine

## 2021-08-01 ENCOUNTER — Other Ambulatory Visit: Payer: Self-pay

## 2021-08-01 ENCOUNTER — Encounter: Payer: Self-pay | Admitting: Internal Medicine

## 2021-08-01 ENCOUNTER — Ambulatory Visit (AMBULATORY_SURGERY_CENTER): Payer: Medicare HMO | Admitting: Internal Medicine

## 2021-08-01 VITALS — BP 121/91 | HR 77 | Temp 98.0°F | Resp 15 | Ht 69.0 in | Wt 242.0 lb

## 2021-08-01 DIAGNOSIS — E119 Type 2 diabetes mellitus without complications: Secondary | ICD-10-CM | POA: Diagnosis not present

## 2021-08-01 DIAGNOSIS — D122 Benign neoplasm of ascending colon: Secondary | ICD-10-CM

## 2021-08-01 DIAGNOSIS — Z1211 Encounter for screening for malignant neoplasm of colon: Secondary | ICD-10-CM | POA: Diagnosis not present

## 2021-08-01 DIAGNOSIS — D123 Benign neoplasm of transverse colon: Secondary | ICD-10-CM

## 2021-08-01 DIAGNOSIS — D120 Benign neoplasm of cecum: Secondary | ICD-10-CM | POA: Diagnosis not present

## 2021-08-01 MED ORDER — SODIUM CHLORIDE 0.9 % IV SOLN
500.0000 mL | Freq: Once | INTRAVENOUS | Status: DC
Start: 2021-08-01 — End: 2021-08-01

## 2021-08-01 NOTE — Progress Notes (Signed)
GASTROENTEROLOGY PROCEDURE H&P NOTE   Primary Care Physician: Biagio Borg, MD    Reason for Procedure:  Colorectal cancer screening  Plan:    Colonoscopy  Patient is appropriate for endoscopic procedure(s) in the ambulatory ([place]) setting.  The nature of the procedure, as well as the risks, benefits, and alternatives were carefully and thoroughly reviewed with the patient. Ample time for discussion and questions allowed. The patient understood, was satisfied, and agreed to proceed.     HPI: Keith [name] is a 65 y.o. male who presents for screening colonoscopy.  Medical history as below.  Seen in the office on 06/04/2021.  No significant change in medical history since that time.  Tolerated the prep.  No complaint today including chest pain or shortness of breath.  No abdominal pain today.  Past Medical History:  Diagnosis Date   Anxiety    Arthritis    Cardiomyopathy    Echo (5/11) showed EF 30% with diffuse hypokinesis, normal wall thickness, mildly decreased RV systolic function. This may be a cardiomyopathy due to hemochromatosis versus tachy-mediated. Unable to get cardiac MRI due to claustrophobia.   Cervical spine pain    Chronic   Chest pain    Exercise/adenosine Myoview showed EF 50% (3/10). Inferior hypokinesis. Perfusion images, inferior scar and ischemia in the apical anterior septum and in teh inferior wall . Suggestive of a CD. LHC (3/10) showed only lu minal irregulatrities in the coronaries w/ EF 55%, suggesting that myoview was a false positive, likely from diaphragmatic attnuation.   Diabetes mellitus    Diverticulosis    Fatty liver    GERD (gastroesophageal reflux disease)    Heart murmur    Hereditary hemochromatosis    Compound heterozygote. Pt gets periodic phlebotomies   History of colonic polyps    Hyperlipidemia    Hypertension    Low back pain    The pt is S/P spinal fusion surgeries   Multifocal atrial tachycardia (HCC)    Wandering  atrial pacemaker. Holter monior in 5/11 showed frequent runs of symtomatic MAT with heart rate around 100. Pt is now  on amiodarone to try to suppress MAT. PFTs (6/11): FVC 105%, FEV1 109%, TLC 112%, DLCO 87% (minimal obstructive effect). LFTs stable on amiodarone   Peptic ulcer disease    Thyroid nodule     Past Surgical History:  Procedure Laterality Date   INGUINAL HERNIA REPAIR Right    LUMBAR [place] SURGERY  09/15/1989   TONSILLECTOMY      Prior to Admission medications   Medication Sig Start Date End Date Taking? Authorizing Provider  amLODipine (NORVASC) 5 MG tablet Take 1 tablet (5 mg total) by mouth daily. 10/12/20 10/12/21 Yes Biagio Borg, MD  aspirin 81 MG EC tablet Take 81 mg by mouth daily.   Yes [provider]  Blood Glucose Calibration (TRUE METRIX LEVEL 1) Low SOLN Use as directed once daily E11.9 07/11/21  Yes Biagio Borg, MD  Blood Glucose Monitoring Suppl (TRUE METRIX METER) w/Device KIT Use as directed once daily E11.9 07/11/21  Yes Biagio Borg, MD  Cholecalciferol (VITAMIN D-3 PO) Take 1 tablet by mouth daily. PATIENT TAKES ONCE TABLET BY MOUTH ONCE DAILY . PATIENT NOT SURE OF DOSEAGE.   Yes [provider]  DROPLET PEN NEEDLES 32G X 4 MM MISC  11/19/20  Yes [provider]  fenofibrate (TRICOR) 145 MG tablet TAKE 1 TABLET EVERY DAY 07/10/21  Yes Biagio Borg, MD  glipiZIDE (  GLUCOTROL XL) 10 MG 24 hr tablet TAKE 1 TABLET BY MOUTH DAILY WITH BREAKFAST. NEED APPOINTMENT. 01/18/21  Yes Biagio Borg, MD  glucosamine-chondroitin 500-400 MG tablet Take 1 tablet by mouth 3 (three) times daily.   Yes [provider]  glucose blood (RELION TRUE METRIX TEST STRIPS) test strip Use as instructed once daily E11.9 07/11/21  Yes Biagio Borg, MD  levothyroxine (SYNTHROID) 25 MCG tablet TAKE 1 TABLET EVERY DAY (NEED MD APPOINTMENT) 01/18/21  Yes Biagio Borg, MD  metFORMIN (GLUCOPHAGE-XR) 500 MG 24 hr tablet TAKE 4 TABLETS EVERY DAY WITH BREAKFAST  01/18/21  Yes Biagio Borg, MD  metoprolol succinate (TOPROL-XL) 100 MG 24 hr tablet TAKE 1 TABLET BY MOUTH TWICE DAILY. NEED APPOINTMENT. 01/18/21  Yes Biagio Borg, MD  olmesartan (BENICAR) 40 MG tablet Take 1 tablet (40 mg total) by mouth daily. 10/12/20  Yes Biagio Borg, MD  Omega-3 Fatty Acids (FISH OIL PO) Take 1 tablet by mouth daily.    Yes [provider]  pantoprazole (PROTONIX) 40 MG tablet TAKE 1 TABLET BY MOUTH DAILY. ANNUAL APPOINTMENT DUE IN [facility]. MUST SEE PROVIDER FOR FUTURE REFILLS. 01/18/21  Yes Biagio Borg, MD  TRUEplus Lancets 33G MISC Use as directed once daily E11.9 07/11/21  Yes Biagio Borg, MD  Alcohol Swabs (B-D SINGLE USE SWABS REGULAR) PADS Use as directed once daily E11.9 07/11/21   Biagio Borg, MD  ALPRAZolam Duanne Moron) 0.5 MG tablet 1/2 - 1 tab by mouth twice per day as needed 11/16/18   Biagio Borg, MD  diazepam (VALIUM) 5 MG tablet Take 1 tablet (5 mg total) by mouth daily as needed for muscle spasms or sedation. 05/12/18   Biagio Borg, MD  ibuprofen (ADVIL,MOTRIN) 400 MG tablet Take 400 mg by mouth every 6 (six) hours as needed for headache or moderate pain. Reported on 10/30/2015    [provider]  pioglitazone (ACTOS) 45 MG tablet TAKE 1 TABLET BY MOUTH DAILY. ANNUAL APPT DUE IN [place], [place] SEE PROVIDER FOR FUTURE REFILLS. 01/18/21   Biagio Borg, MD  tadalafil (CIALIS) 20 MG tablet Take 1 tablet (20 mg total) by mouth daily as needed for erectile dysfunction. 04/17/21 05/17/21  Biagio Borg, MD    Current Outpatient Medications  Medication Sig Dispense Refill   amLODipine (NORVASC) 5 MG tablet Take 1 tablet (5 mg total) by mouth daily. 90 tablet 3   aspirin 81 MG EC tablet Take 81 mg by mouth daily.     Blood Glucose Calibration (TRUE METRIX LEVEL 1) Low SOLN Use as directed once daily E11.9 1 each 3   Blood Glucose Monitoring Suppl (TRUE METRIX METER) w/Device KIT Use as directed once daily E11.9 1 kit 0   Cholecalciferol (VITAMIN D-3 PO) Take  1 tablet by mouth daily. PATIENT TAKES ONCE TABLET BY MOUTH ONCE DAILY . PATIENT NOT SURE OF DOSEAGE.     DROPLET PEN NEEDLES 32G X 4 MM MISC      fenofibrate (TRICOR) 145 MG tablet TAKE 1 TABLET EVERY DAY 90 tablet 3   glipiZIDE (GLUCOTROL XL) 10 MG 24 hr tablet TAKE 1 TABLET BY MOUTH DAILY WITH BREAKFAST. NEED APPOINTMENT. 90 tablet 1   glucosamine-chondroitin 500-400 MG tablet Take 1 tablet by mouth 3 (three) times daily.     glucose blood (RELION TRUE METRIX TEST STRIPS) test strip Use as instructed once daily E11.9 100 each 12   levothyroxine (SYNTHROID) 25 MCG tablet TAKE 1  TABLET EVERY DAY (NEED MD APPOINTMENT) 90 tablet 1   metFORMIN (GLUCOPHAGE-XR) 500 MG 24 hr tablet TAKE 4 TABLETS EVERY DAY WITH BREAKFAST 360 tablet 1   metoprolol succinate (TOPROL-XL) 100 MG 24 hr tablet TAKE 1 TABLET BY MOUTH TWICE DAILY. NEED APPOINTMENT. 180 tablet 1   olmesartan (BENICAR) 40 MG tablet Take 1 tablet (40 mg total) by mouth daily. 90 tablet 3   Omega-3 Fatty Acids (FISH OIL PO) Take 1 tablet by mouth daily.      pantoprazole (PROTONIX) 40 MG tablet TAKE 1 TABLET BY MOUTH DAILY. ANNUAL APPOINTMENT DUE IN [facility]. MUST SEE PROVIDER FOR FUTURE REFILLS. 90 tablet 1   TRUEplus Lancets 33G MISC Use as directed once daily E11.9 100 each 12   Alcohol Swabs (B-D SINGLE USE SWABS REGULAR) PADS Use as directed once daily E11.9 100 each 3   ALPRAZolam (XANAX) 0.5 MG tablet 1/2 - 1 tab by mouth twice per day as needed 60 tablet 2   diazepam (VALIUM) 5 MG tablet Take 1 tablet (5 mg total) by mouth daily as needed for muscle spasms or sedation. 30 tablet 0   ibuprofen (ADVIL,MOTRIN) 400 MG tablet Take 400 mg by mouth every 6 (six) hours as needed for headache or moderate pain. Reported on 10/30/2015     pioglitazone (ACTOS) 45 MG tablet TAKE 1 TABLET BY MOUTH DAILY. ANNUAL APPT DUE IN [place], MUST SEE PROVIDER FOR FUTURE REFILLS. 90 tablet 1   tadalafil (CIALIS) 20 MG tablet Take 1 tablet (20 mg total) by mouth daily as  needed for erectile dysfunction. 30 tablet 11   Current Facility-Administered Medications  Medication Dose Route Frequency Provider Last Rate Last Admin   0.9 %  sodium chloride infusion  500 mL Intravenous Once Randie Tallarico, Lajuan Lines, MD        Allergies as of 08/01/2021 - Review Complete 08/01/2021  Allergen Reaction Noted   Dicyclomine Nausea And Vomiting and Other (See Comments) 08/01/2021   Lipitor [atorvastatin]  05/12/2013   Penicillins     Reglan [metoclopramide]  06/04/2021   Zocor [simvastatin] Other (See Comments) 11/16/2018   Zoloft [sertraline hcl]  06/04/2021    Family History  Problem Relation Age of Onset   Colon polyps Mother    Multiple sclerosis Mother    Diabetes Mother    Colitis Mother    Hypertension Father    Alcohol abuse Father    Multiple sclerosis Sister    Diabetes Paternal Grandmother    Diabetes Paternal Grandfather    Colon cancer Neg Hx     Social History   Socioeconomic History   Marital status: Married    Spouse name: Not on file   Number of children: 3   Years of education: Not on file   Highest education level: Not on file  Occupational History   Occupation: Disabled    Employer: UNEMPLOYED  Tobacco Use   Smoking status: Former    Types: Cigarettes    Quit date: 09/16/2003    Years since quitting: 17.8   Smokeless tobacco: Never  Vaping Use   Vaping Use: Never used  Substance and Sexual Activity   Alcohol use: Yes    Alcohol/week: 1.0 standard drink    Types: 1 Standard drinks or equivalent per week    Comment: social   Drug use: Yes    Comment: Quit in 1980   Sexual activity: Yes  Other Topics Concern   Not on file  Social History Narrative   Married  Lives in [place]   3 children   Daily caffeine use - 3 drinks daily   Does not get regular exercise      [employer] on file.   Social Determinants of Health   Financial Resource Strain: Not on file  Food Insecurity: Not on file  Transportation Needs:  Not on file  Physical Activity: Not on file  Stress: Not on file  Social Connections: Not on file  Intimate Partner Violence: Not on file    Physical Exam: Vital signs in last 24 hours: '@BP'  137/86   Pulse 84   Temp 98 F (36.7 C)   Ht '5\' 9"'  (1.753 m)   Wt 242 lb (109.8 kg)   SpO2 95%   BMI 35.74 kg/m  GEN: NAD EYE: Sclerae anicteric ENT: MMM CV: Non-tachycardic Pulm: CTA b/l GI: Soft, NT/ND NEURO:  Alert & Oriented x 3   Keith Jarred, MD [facility]  08/01/2021 10:48 AM

## 2021-08-01 NOTE — Op Note (Signed)
[facility] Patient Name: Keith [name] Procedure Date: 08/01/2021 10:49 AM MRN: [id number] Endoscopist: Jerene Bears , MD Age: 65 Referring MD:  Date of Birth: 08-19-1956 Gender: Male Account #: [id number] Procedure:                Colonoscopy Indications:              Screening for colorectal malignant neoplasm, Last                            colonoscopy 10 years ago Medicines:                Monitored Anesthesia Care Procedure:                Pre-Anesthesia Assessment:                           - Prior to the procedure, a History and Physical                            was performed, and patient medications and                            allergies were reviewed. The patient's tolerance of                            previous anesthesia was also reviewed. The risks                            and benefits of the procedure and the sedation                            options and risks were discussed with the patient.                            All questions were answered, and informed consent                            was obtained. Prior Anticoagulants: The patient has                            taken no previous anticoagulant or antiplatelet                            agents. ASA Grade Assessment: III - A patient with                            severe systemic disease. After reviewing the risks                            and benefits, the patient was deemed in                            satisfactory condition to undergo the procedure.  After obtaining informed consent, the colonoscope                            was passed under direct vision. Throughout the                            procedure, the patient's blood pressure, pulse, and                            oxygen saturations were monitored continuously. The                            CF [id number] #[id number] was introduced through the anus                            and advanced to the cecum,  identified by                            appendiceal orifice and ileocecal valve. The                            colonoscopy was performed without difficulty. The                            patient tolerated the procedure well. The quality                            of the bowel preparation was good. The ileocecal                            valve, appendiceal orifice, and rectum were                            photographed. Scope In: 11:00:54 AM Scope Out: 11:13:31 AM Scope Withdrawal Time: 0 hours 10 minutes 44 seconds  Total Procedure Duration: 0 hours 12 minutes 37 seconds  Findings:                 Skin tags were found on perianal exam.                           Two sessile polyps were found in the ascending                            colon and cecum. The polyps were 3 to 5 mm in size.                            These polyps were removed with a cold snare.                            Resection and retrieval were complete.                           External and internal hemorrhoids were found during  retroflexion and during digital exam. Complications:            No immediate complications. Estimated Blood Loss:     Estimated blood loss: none. Impression:               - Perianal skin tags found on perianal exam.                           - Two 3 to 5 mm polyps in the ascending colon and                            in the cecum, removed with a cold snare. Resected                            and retrieved.                           - External and internal hemorrhoids. Recommendation:           - Patient has a contact number available for                            emergencies. The signs and symptoms of potential                            delayed complications were discussed with the                            patient. Return to normal activities tomorrow.                            Written discharge instructions were provided to the                             patient.                           - Resume previous diet.                           - Continue present medications.                           - Await pathology results.                           - Repeat colonoscopy is recommended. The                            colonoscopy date will be determined after pathology                            results from today's exam become available for                            review. Jerene Bears, MD 08/01/2021 11:16:25 AM This report has been signed  electronically.

## 2021-08-01 NOTE — Progress Notes (Signed)
VS by DT    

## 2021-08-01 NOTE — Patient Instructions (Signed)
Await pathology  Please read handouts about polyps and hemorrhoids  Continue your normal medications   YOU HAD AN ENDOSCOPIC PROCEDURE TODAY AT [facility]:   Refer to the procedure report that was given to you for any specific questions about what was found during the examination.  If the procedure report does not answer your questions, please call your gastroenterologist to clarify.  If you requested that your care partner not be given the details of your procedure findings, then the procedure report has been included in a sealed envelope for you to review at your convenience later.  YOU SHOULD EXPECT: Some feelings of bloating in the abdomen. Passage of more gas than usual.  Walking can help get rid of the air that was put into your GI tract during the procedure and reduce the bloating. If you had a lower endoscopy (such as a colonoscopy or flexible sigmoidoscopy) you may notice spotting of blood in your stool or on the toilet paper. If you underwent a bowel prep for your procedure, you may not have a normal bowel movement for a few days.  Please Note:  You might notice some irritation and congestion in your nose or some drainage.  This is from the oxygen used during your procedure.  There is no need for concern and it should clear up in a day or so.  SYMPTOMS TO REPORT IMMEDIATELY:  Following lower endoscopy (colonoscopy or flexible sigmoidoscopy):  Excessive amounts of blood in the stool  Significant tenderness or worsening of abdominal pains  Swelling of the abdomen that is new, acute  Fever of 100F or higher  For urgent or emergent issues, a gastroenterologist can be reached at any hour by calling [phone]. Do not use MyChart messaging for urgent concerns.    DIET:  We do recommend a small meal at first, but then you may proceed to your regular diet.  Drink plenty of fluids but you should avoid alcoholic beverages for 24 hours.  ACTIVITY:  You should plan to  take it easy for the rest of today and you should NOT DRIVE or use heavy machinery until tomorrow (because of the sedation medicines used during the test).    FOLLOW UP: Our staff will call the number listed on your records 48-72 hours following your procedure to check on you and address any questions or concerns that you may have regarding the information given to you following your procedure. If we do not reach you, we will leave a message.  We will attempt to reach you two times.  During this call, we will ask if you have developed any symptoms of COVID 19. If you develop any symptoms (ie: fever, flu-like symptoms, shortness of breath, cough etc.) before then, please call [phone].  If you test positive for Covid 19 in the 2 weeks post procedure, please call and report this information to [place].    If any biopsies were taken you will be contacted by phone or by letter within the next 1-3 weeks.  Please call us at [phone] if you have not heard about the biopsies in 3 weeks.    SIGNATURES/CONFIDENTIALITY: You and/or your care partner have signed paperwork which will be entered into your electronic medical record.  These signatures attest to the fact that that the information above on your After Visit Summary has been reviewed and is understood.  Full responsibility of the confidentiality of this discharge information lies with you and/or your care-partner.

## 2021-08-01 NOTE — Progress Notes (Signed)
Report given to PACU, vss 

## 2021-08-05 ENCOUNTER — Telehealth: Payer: Self-pay

## 2021-08-05 ENCOUNTER — Encounter: Payer: Self-pay | Admitting: Internal Medicine

## 2021-08-05 NOTE — Telephone Encounter (Signed)
  Follow up Call-  Call back number 08/01/2021  Post procedure Call Back phone  # [phone]  Permission to leave phone message Yes  Some recent data might be hidden     Patient questions:  Do you have a fever, pain , or abdominal swelling? No. Pain Score  0 *  Have you tolerated food without any problems? Yes.    Have you been able to return to your normal activities? Yes.    Do you have any questions about your discharge instructions: Diet   No. Medications  No. Follow up visit  No.  Do you have questions or concerns about your Care? No.  Actions: * If pain score is 4 or above: No action needed, pain <4.   Have you developed a fever since your procedure? No   2.   Have you had an respiratory symptoms (SOB or cough) since your procedure? No   3.   Have you tested positive for COVID 19 since your procedure no   4.   Have you had any family members/close contacts diagnosed with the COVID 19 since your procedure?  No    If yes to any of these questions please route to Joylene John, RN and Joella Prince, RN

## 2021-08-21 DIAGNOSIS — H524 Presbyopia: Secondary | ICD-10-CM | POA: Diagnosis not present

## 2021-08-21 DIAGNOSIS — H52223 Regular astigmatism, bilateral: Secondary | ICD-10-CM | POA: Diagnosis not present

## 2021-09-05 ENCOUNTER — Inpatient Hospital Stay (HOSPITAL_COMMUNITY)
Admission: EM | Admit: 2021-09-05 | Discharge: 2021-09-07 | DRG: 247 | Disposition: A | Discharge status: Home / Self Care | Payer: Medicare HMO | Attending: Internal Medicine | Admitting: Internal Medicine

## 2021-09-05 DIAGNOSIS — Z20822 Contact with and (suspected) exposure to covid-19: Secondary | ICD-10-CM | POA: Diagnosis present

## 2021-09-05 DIAGNOSIS — E039 Hypothyroidism, unspecified: Secondary | ICD-10-CM | POA: Diagnosis present

## 2021-09-05 DIAGNOSIS — Z6833 Body mass index (BMI) 33.0-33.9, adult: Secondary | ICD-10-CM | POA: Diagnosis not present

## 2021-09-05 DIAGNOSIS — F40240 Claustrophobia: Secondary | ICD-10-CM | POA: Diagnosis present

## 2021-09-05 DIAGNOSIS — I5032 Chronic diastolic (congestive) heart failure: Secondary | ICD-10-CM | POA: Diagnosis present

## 2021-09-05 DIAGNOSIS — E785 Hyperlipidemia, unspecified: Secondary | ICD-10-CM | POA: Diagnosis present

## 2021-09-05 DIAGNOSIS — Z888 Allergy status to other drugs, medicaments and biological substances status: Secondary | ICD-10-CM

## 2021-09-05 DIAGNOSIS — Z8371 Family history of colonic polyps: Secondary | ICD-10-CM

## 2021-09-05 DIAGNOSIS — M545 Low back pain, unspecified: Secondary | ICD-10-CM | POA: Diagnosis present

## 2021-09-05 DIAGNOSIS — F419 Anxiety disorder, unspecified: Secondary | ICD-10-CM | POA: Diagnosis present

## 2021-09-05 DIAGNOSIS — M1990 Unspecified osteoarthritis, unspecified site: Secondary | ICD-10-CM | POA: Diagnosis present

## 2021-09-05 DIAGNOSIS — Z811 Family history of alcohol abuse and dependence: Secondary | ICD-10-CM

## 2021-09-05 DIAGNOSIS — R079 Chest pain, unspecified: Secondary | ICD-10-CM | POA: Diagnosis not present

## 2021-09-05 DIAGNOSIS — Z794 Long term (current) use of insulin: Secondary | ICD-10-CM | POA: Diagnosis not present

## 2021-09-05 DIAGNOSIS — Z880 Allergy status to penicillin: Secondary | ICD-10-CM

## 2021-09-05 DIAGNOSIS — I10 Essential (primary) hypertension: Secondary | ICD-10-CM | POA: Diagnosis not present

## 2021-09-05 DIAGNOSIS — Z820 Family history of epilepsy and other diseases of the nervous system: Secondary | ICD-10-CM | POA: Diagnosis not present

## 2021-09-05 DIAGNOSIS — Z7984 Long term (current) use of oral hypoglycemic drugs: Secondary | ICD-10-CM

## 2021-09-05 DIAGNOSIS — Z87891 Personal history of nicotine dependence: Secondary | ICD-10-CM

## 2021-09-05 DIAGNOSIS — Z8249 Family history of ischemic heart disease and other diseases of the circulatory system: Secondary | ICD-10-CM

## 2021-09-05 DIAGNOSIS — Z79899 Other long term (current) drug therapy: Secondary | ICD-10-CM

## 2021-09-05 DIAGNOSIS — Z833 Family history of diabetes mellitus: Secondary | ICD-10-CM

## 2021-09-05 DIAGNOSIS — Z955 Presence of coronary angioplasty implant and graft: Secondary | ICD-10-CM

## 2021-09-05 DIAGNOSIS — I25110 Atherosclerotic heart disease of native coronary artery with unstable angina pectoris: Secondary | ICD-10-CM | POA: Diagnosis present

## 2021-09-05 DIAGNOSIS — E119 Type 2 diabetes mellitus without complications: Secondary | ICD-10-CM

## 2021-09-05 DIAGNOSIS — Z981 Arthrodesis status: Secondary | ICD-10-CM

## 2021-09-05 DIAGNOSIS — K760 Fatty (change of) liver, not elsewhere classified: Secondary | ICD-10-CM | POA: Diagnosis present

## 2021-09-05 DIAGNOSIS — E669 Obesity, unspecified: Secondary | ICD-10-CM | POA: Diagnosis present

## 2021-09-05 DIAGNOSIS — I110 Hypertensive heart disease with heart failure: Secondary | ICD-10-CM | POA: Diagnosis present

## 2021-09-05 DIAGNOSIS — I428 Other cardiomyopathies: Secondary | ICD-10-CM | POA: Diagnosis present

## 2021-09-05 DIAGNOSIS — Z8719 Personal history of other diseases of the digestive system: Secondary | ICD-10-CM | POA: Diagnosis not present

## 2021-09-05 DIAGNOSIS — J9811 Atelectasis: Secondary | ICD-10-CM | POA: Diagnosis not present

## 2021-09-05 DIAGNOSIS — I200 Unstable angina: Secondary | ICD-10-CM | POA: Diagnosis present

## 2021-09-05 DIAGNOSIS — R0789 Other chest pain: Secondary | ICD-10-CM | POA: Diagnosis not present

## 2021-09-05 DIAGNOSIS — K219 Gastro-esophageal reflux disease without esophagitis: Secondary | ICD-10-CM | POA: Diagnosis present

## 2021-09-05 DIAGNOSIS — Z7982 Long term (current) use of aspirin: Secondary | ICD-10-CM | POA: Diagnosis not present

## 2021-09-05 DIAGNOSIS — Z79890 Hormone replacement therapy (postmenopausal): Secondary | ICD-10-CM

## 2021-09-05 HISTORY — DX: Other specified cardiac arrhythmias: I49.8

## 2021-09-05 HISTORY — DX: Hypothyroidism, unspecified: E03.9

## 2021-09-05 HISTORY — DX: Atherosclerotic heart disease of native coronary artery without angina pectoris: I25.10

## 2021-09-05 HISTORY — DX: Other cardiomyopathies: I42.8

## 2021-09-05 HISTORY — DX: Chronic systolic (congestive) heart failure: I50.22

## 2021-09-05 LAB — COMPREHENSIVE METABOLIC PANEL
ALT: 26 U/L (ref 0–44)
AST: 24 U/L (ref 15–41)
Albumin: 4.5 g/dL (ref 3.5–5.0)
Alkaline Phosphatase: 43 U/L (ref 38–126)
Anion gap: 7 (ref 5–15)
BUN: 19 mg/dL (ref 8–23)
CO2: 23 mmol/L (ref 22–32)
Calcium: 9.3 mg/dL (ref 8.9–10.3)
Chloride: 108 mmol/L (ref 98–111)
Creatinine, Ser: 1.19 mg/dL (ref 0.61–1.24)
GFR, Estimated: >60 mL/min (ref >60)
Glucose, Bld: 132 mg/dL — ABNORMAL HIGH (ref 70–99)
Potassium: 3.8 mmol/L (ref 3.5–5.1)
Sodium: 138 mmol/L (ref 135–145)
Total Bilirubin: 0.7 mg/dL (ref 0.3–1.2)
Total Protein: 7.8 g/dL (ref 6.5–8.1)

## 2021-09-05 LAB — CBC WITH DIFFERENTIAL/PLATELET
Abs Immature Granulocytes: 0.03 10*3/uL (ref 0.00–0.07)
Basophils Absolute: 0.1 10*3/uL (ref 0.0–0.1)
Basophils Relative: 1 %
Eosinophils Absolute: 0.3 10*3/uL (ref 0.0–0.5)
Eosinophils Relative: 3 %
HCT: 43.5 % (ref 39.0–52.0)
Hemoglobin: 14.5 g/dL (ref 13.0–17.0)
Immature Granulocytes: 0 %
Lymphocytes Relative: 26 %
Lymphs Abs: 2.7 10*3/uL (ref 0.7–4.0)
MCH: 30.7 pg (ref 26.0–34.0)
MCHC: 33.3 g/dL (ref 30.0–36.0)
MCV: 92.2 fL (ref 80.0–100.0)
Monocytes Absolute: 0.9 10*3/uL (ref 0.1–1.0)
Monocytes Relative: 9 %
Neutro Abs: 6.4 10*3/uL (ref 1.7–7.7)
Neutrophils Relative %: 61 %
Platelets: 268 10*3/uL (ref 150–400)
RBC: 4.72 MIL/uL (ref 4.22–5.81)
RDW: 13.9 % (ref 11.5–15.5)
WBC: 10.4 10*3/uL (ref 4.0–10.5)
nRBC: 0 % (ref 0.0–0.2)

## 2021-09-05 LAB — RESP PANEL BY RT-PCR (FLU A&B, COVID) ARPGX2
Influenza A by PCR: NEGATIVE
Influenza B by PCR: NEGATIVE
SARS Coronavirus 2 by RT PCR: NEGATIVE

## 2021-09-05 LAB — LIPASE, BLOOD: Lipase: 39 U/L (ref 11–51)

## 2021-09-05 LAB — TROPONIN I (HIGH SENSITIVITY): Troponin I (High Sensitivity): 4 ng/L (ref <18)

## 2021-09-05 NOTE — ED Triage Notes (Signed)
Pt came in with c/o chest pain times three to four days. It comes and goes, spreads across chest. Pt states it is worse when he walks. Pt has not taken any medication except rolaids.

## 2021-09-06 ENCOUNTER — Inpatient Hospital Stay (HOSPITAL_COMMUNITY)
Admission: EM | Disposition: A | Discharge status: Home / Self Care | Payer: Self-pay | Source: Home / Self Care | Attending: Internal Medicine

## 2021-09-06 ENCOUNTER — Encounter (HOSPITAL_COMMUNITY): Payer: Self-pay | Admitting: Emergency Medicine

## 2021-09-06 ENCOUNTER — Observation Stay (HOSPITAL_COMMUNITY): Payer: Medicare HMO

## 2021-09-06 ENCOUNTER — Other Ambulatory Visit: Payer: Self-pay

## 2021-09-06 ENCOUNTER — Emergency Department (HOSPITAL_COMMUNITY): Payer: Medicare HMO

## 2021-09-06 DIAGNOSIS — M545 Low back pain, unspecified: Secondary | ICD-10-CM | POA: Diagnosis present

## 2021-09-06 DIAGNOSIS — I200 Unstable angina: Secondary | ICD-10-CM | POA: Diagnosis present

## 2021-09-06 DIAGNOSIS — E669 Obesity, unspecified: Secondary | ICD-10-CM | POA: Diagnosis present

## 2021-09-06 DIAGNOSIS — Z833 Family history of diabetes mellitus: Secondary | ICD-10-CM | POA: Diagnosis not present

## 2021-09-06 DIAGNOSIS — I10 Essential (primary) hypertension: Secondary | ICD-10-CM

## 2021-09-06 DIAGNOSIS — Z8249 Family history of ischemic heart disease and other diseases of the circulatory system: Secondary | ICD-10-CM | POA: Diagnosis not present

## 2021-09-06 DIAGNOSIS — Z794 Long term (current) use of insulin: Secondary | ICD-10-CM

## 2021-09-06 DIAGNOSIS — I25110 Atherosclerotic heart disease of native coronary artery with unstable angina pectoris: Principal | ICD-10-CM

## 2021-09-06 DIAGNOSIS — Z8719 Personal history of other diseases of the digestive system: Secondary | ICD-10-CM | POA: Diagnosis not present

## 2021-09-06 DIAGNOSIS — I5032 Chronic diastolic (congestive) heart failure: Secondary | ICD-10-CM | POA: Diagnosis present

## 2021-09-06 DIAGNOSIS — Z87891 Personal history of nicotine dependence: Secondary | ICD-10-CM | POA: Diagnosis not present

## 2021-09-06 DIAGNOSIS — F40240 Claustrophobia: Secondary | ICD-10-CM | POA: Diagnosis present

## 2021-09-06 DIAGNOSIS — Z820 Family history of epilepsy and other diseases of the nervous system: Secondary | ICD-10-CM | POA: Diagnosis not present

## 2021-09-06 DIAGNOSIS — Z6833 Body mass index (BMI) 33.0-33.9, adult: Secondary | ICD-10-CM | POA: Diagnosis not present

## 2021-09-06 DIAGNOSIS — R079 Chest pain, unspecified: Secondary | ICD-10-CM | POA: Diagnosis present

## 2021-09-06 DIAGNOSIS — K760 Fatty (change of) liver, not elsewhere classified: Secondary | ICD-10-CM | POA: Diagnosis present

## 2021-09-06 DIAGNOSIS — M1990 Unspecified osteoarthritis, unspecified site: Secondary | ICD-10-CM | POA: Diagnosis present

## 2021-09-06 DIAGNOSIS — Z8371 Family history of colonic polyps: Secondary | ICD-10-CM | POA: Diagnosis not present

## 2021-09-06 DIAGNOSIS — I428 Other cardiomyopathies: Secondary | ICD-10-CM

## 2021-09-06 DIAGNOSIS — E119 Type 2 diabetes mellitus without complications: Secondary | ICD-10-CM | POA: Diagnosis present

## 2021-09-06 DIAGNOSIS — Z7982 Long term (current) use of aspirin: Secondary | ICD-10-CM | POA: Diagnosis not present

## 2021-09-06 DIAGNOSIS — Z811 Family history of alcohol abuse and dependence: Secondary | ICD-10-CM | POA: Diagnosis not present

## 2021-09-06 DIAGNOSIS — Z20822 Contact with and (suspected) exposure to covid-19: Secondary | ICD-10-CM | POA: Diagnosis present

## 2021-09-06 DIAGNOSIS — E785 Hyperlipidemia, unspecified: Secondary | ICD-10-CM | POA: Diagnosis not present

## 2021-09-06 DIAGNOSIS — E039 Hypothyroidism, unspecified: Secondary | ICD-10-CM | POA: Diagnosis present

## 2021-09-06 DIAGNOSIS — I110 Hypertensive heart disease with heart failure: Secondary | ICD-10-CM | POA: Diagnosis present

## 2021-09-06 DIAGNOSIS — Z981 Arthrodesis status: Secondary | ICD-10-CM | POA: Diagnosis not present

## 2021-09-06 HISTORY — PX: CORONARY STENT INTERVENTION: CATH118234

## 2021-09-06 HISTORY — PX: LEFT HEART CATH AND CORONARY ANGIOGRAPHY: CATH118249

## 2021-09-06 LAB — HEMOGLOBIN A1C
Hgb A1c MFr Bld: 7.8 % — ABNORMAL HIGH (ref 4.8–5.6)
Mean Plasma Glucose: 177.16 mg/dL

## 2021-09-06 LAB — LIPID PANEL
Cholesterol: 167 mg/dL (ref 0–200)
HDL: 32 mg/dL — ABNORMAL LOW (ref >40)
LDL Cholesterol: 127 mg/dL — ABNORMAL HIGH (ref 0–99)
Total CHOL/HDL Ratio: 5.2 RATIO
Triglycerides: 39 mg/dL (ref <150)
VLDL: 8 mg/dL (ref 0–40)

## 2021-09-06 LAB — GLUCOSE, CAPILLARY
Glucose-Capillary: 222 mg/dL — ABNORMAL HIGH (ref 70–99)
Glucose-Capillary: 147 mg/dL — ABNORMAL HIGH (ref 70–99)

## 2021-09-06 LAB — CBC
HCT: 39.5 % (ref 39.0–52.0)
Hemoglobin: 13.1 g/dL (ref 13.0–17.0)
MCH: 30.7 pg (ref 26.0–34.0)
MCHC: 33.2 g/dL (ref 30.0–36.0)
MCV: 92.5 fL (ref 80.0–100.0)
Platelets: 237 10*3/uL (ref 150–400)
RBC: 4.27 MIL/uL (ref 4.22–5.81)
RDW: 13.6 % (ref 11.5–15.5)
WBC: 9.3 10*3/uL (ref 4.0–10.5)
nRBC: 0 % (ref 0.0–0.2)

## 2021-09-06 LAB — POCT ACTIVATED CLOTTING TIME
Activated Clotting Time: 251 seconds
Activated Clotting Time: 263 seconds
Activated Clotting Time: 281 seconds

## 2021-09-06 LAB — ECHOCARDIOGRAM COMPLETE
Area-P 1/2: 5.62 cm2
Height: 71 in
S' Lateral: 3.1 cm
Weight: 3680 oz

## 2021-09-06 LAB — TROPONIN I (HIGH SENSITIVITY): Troponin I (High Sensitivity): 4 ng/L (ref <18)

## 2021-09-06 LAB — CREATININE, SERUM
Creatinine, Ser: 1.16 mg/dL (ref 0.61–1.24)
GFR, Estimated: >60 mL/min (ref >60)

## 2021-09-06 SURGERY — LEFT HEART CATH AND CORONARY ANGIOGRAPHY
Anesthesia: LOCAL

## 2021-09-06 MED ORDER — TIROFIBAN (AGGRASTAT) BOLUS VIA INFUSION
INTRAVENOUS | Status: DC | PRN
  Administered 2021-09-06: 14:00:00 2607.5 ug via INTRAVENOUS

## 2021-09-06 MED ORDER — TIROFIBAN HCL IN NACL 5-0.9 MG/100ML-% IV SOLN
INTRAVENOUS | Status: AC
  Filled 2021-09-06: qty 100

## 2021-09-06 MED ORDER — CLOPIDOGREL BISULFATE 300 MG PO TABS
ORAL_TABLET | ORAL | Status: AC
  Filled 2021-09-06: qty 1

## 2021-09-06 MED ORDER — ENOXAPARIN SODIUM 40 MG/0.4ML IJ SOSY
40.0000 mg | PREFILLED_SYRINGE | INTRAMUSCULAR | Status: DC
  Administered 2021-09-07: 09:00:00 40 mg via SUBCUTANEOUS
  Filled 2021-09-06: qty 0.4

## 2021-09-06 MED ORDER — HEPARIN SODIUM (PORCINE) 1000 UNIT/ML IJ SOLN
INTRAMUSCULAR | Status: AC
  Filled 2021-09-06: qty 10

## 2021-09-06 MED ORDER — ACETAMINOPHEN 325 MG PO TABS: 650.0000 mg | ORAL_TABLET | ORAL | Status: DC | PRN

## 2021-09-06 MED ORDER — ATORVASTATIN CALCIUM 80 MG PO TABS: 80.0000 mg | ORAL_TABLET | Freq: Every day | ORAL | Status: DC

## 2021-09-06 MED ORDER — FENTANYL CITRATE (PF) 100 MCG/2ML IJ SOLN
INTRAMUSCULAR | Status: AC
  Filled 2021-09-06: qty 2

## 2021-09-06 MED ORDER — LIDOCAINE HCL (PF) 1 % IJ SOLN
INTRAMUSCULAR | Status: DC | PRN
  Administered 2021-09-06: 4 mL

## 2021-09-06 MED ORDER — AMLODIPINE BESYLATE 5 MG PO TABS
5.0000 mg | ORAL_TABLET | Freq: Every day | ORAL | Status: DC
  Administered 2021-09-06 – 2021-09-07 (×2): 5 mg via ORAL
  Filled 2021-09-06 (×2): qty 1

## 2021-09-06 MED ORDER — SODIUM CHLORIDE 0.9 % IV SOLN: INTRAVENOUS | Status: AC

## 2021-09-06 MED ORDER — SODIUM CHLORIDE 0.9 % IV SOLN: INTRAVENOUS | Status: DC | PRN

## 2021-09-06 MED ORDER — IOHEXOL 350 MG/ML SOLN
INTRAVENOUS | Status: DC | PRN
  Administered 2021-09-06: 14:00:00 225 mL

## 2021-09-06 MED ORDER — FENTANYL CITRATE (PF) 100 MCG/2ML IJ SOLN
INTRAMUSCULAR | Status: DC | PRN
  Administered 2021-09-06 (×3): 25 ug via INTRAVENOUS

## 2021-09-06 MED ORDER — SODIUM CHLORIDE 0.9 % WEIGHT BASED INFUSION
3.0000 mL/kg/h | INTRAVENOUS | Status: AC
  Administered 2021-09-06: 12:00:00 2.397 mL/kg/h via INTRAVENOUS

## 2021-09-06 MED ORDER — ROSUVASTATIN CALCIUM 5 MG PO TABS
5.0000 mg | ORAL_TABLET | Freq: Every day | ORAL | Status: DC
  Administered 2021-09-07: 09:00:00 5 mg via ORAL
  Filled 2021-09-06 (×2): qty 1

## 2021-09-06 MED ORDER — ASPIRIN EC 81 MG PO TBEC
81.0000 mg | DELAYED_RELEASE_TABLET | Freq: Every day | ORAL | Status: DC
  Administered 2021-09-07: 09:00:00 81 mg via ORAL
  Filled 2021-09-06: qty 1

## 2021-09-06 MED ORDER — FENOFIBRATE 160 MG PO TABS
160.0000 mg | ORAL_TABLET | Freq: Every day | ORAL | Status: DC
  Administered 2021-09-06 – 2021-09-07 (×2): 160 mg via ORAL
  Filled 2021-09-06 (×2): qty 1

## 2021-09-06 MED ORDER — MIDAZOLAM HCL 2 MG/2ML IJ SOLN
INTRAMUSCULAR | Status: DC | PRN
  Administered 2021-09-06: 1 mg via INTRAVENOUS

## 2021-09-06 MED ORDER — SODIUM CHLORIDE 0.9% FLUSH: 3.0000 mL | INTRAVENOUS | Status: DC | PRN

## 2021-09-06 MED ORDER — HYDRALAZINE HCL 20 MG/ML IJ SOLN: 10.0000 mg | INTRAMUSCULAR | Status: AC | PRN

## 2021-09-06 MED ORDER — PANTOPRAZOLE SODIUM 40 MG PO TBEC
40.0000 mg | DELAYED_RELEASE_TABLET | Freq: Every day | ORAL | Status: DC
  Administered 2021-09-06 – 2021-09-07 (×2): 40 mg via ORAL
  Filled 2021-09-06 (×2): qty 1

## 2021-09-06 MED ORDER — VERAPAMIL HCL 2.5 MG/ML IV SOLN
INTRAVENOUS | Status: AC
  Filled 2021-09-06: qty 2

## 2021-09-06 MED ORDER — SODIUM CHLORIDE 0.9 % IV SOLN: 250.0000 mL | INTRAVENOUS | Status: DC | PRN

## 2021-09-06 MED ORDER — NITROGLYCERIN 1 MG/10 ML FOR IR/CATH LAB
INTRA_ARTERIAL | Status: AC
  Filled 2021-09-06: qty 10

## 2021-09-06 MED ORDER — ASPIRIN EC 81 MG PO TBEC
81.0000 mg | DELAYED_RELEASE_TABLET | Freq: Every day | ORAL | Status: DC
  Administered 2021-09-06: 10:00:00 81 mg via ORAL
  Filled 2021-09-06: qty 1

## 2021-09-06 MED ORDER — ONDANSETRON HCL 4 MG/2ML IJ SOLN: 4.0000 mg | Freq: Four times a day (QID) | INTRAMUSCULAR | Status: DC | PRN

## 2021-09-06 MED ORDER — LEVOTHYROXINE SODIUM 25 MCG PO TABS
25.0000 ug | ORAL_TABLET | Freq: Every day | ORAL | Status: DC
  Administered 2021-09-06 – 2021-09-07 (×2): 25 ug via ORAL
  Filled 2021-09-06 (×2): qty 1

## 2021-09-06 MED ORDER — SODIUM CHLORIDE 0.9% FLUSH
3.0000 mL | Freq: Two times a day (BID) | INTRAVENOUS | Status: DC
  Administered 2021-09-06 – 2021-09-07 (×2): 3 mL via INTRAVENOUS

## 2021-09-06 MED ORDER — NITROGLYCERIN 1 MG/10 ML FOR IR/CATH LAB
INTRA_ARTERIAL | Status: DC | PRN
  Administered 2021-09-06: 200 ug via INTRACORONARY

## 2021-09-06 MED ORDER — VERAPAMIL HCL 2.5 MG/ML IV SOLN
INTRAVENOUS | Status: DC | PRN
  Administered 2021-09-06: 13:00:00 10 mL via INTRA_ARTERIAL

## 2021-09-06 MED ORDER — OXYCODONE HCL 5 MG PO TABS
5.0000 mg | ORAL_TABLET | ORAL | Status: DC | PRN
  Administered 2021-09-06: 17:00:00 10 mg via ORAL
  Filled 2021-09-06: qty 2

## 2021-09-06 MED ORDER — MIDAZOLAM HCL 2 MG/2ML IJ SOLN
INTRAMUSCULAR | Status: AC
  Filled 2021-09-06: qty 2

## 2021-09-06 MED ORDER — METOPROLOL TARTRATE 5 MG/5ML IV SOLN
INTRAVENOUS | Status: AC
  Filled 2021-09-06: qty 5

## 2021-09-06 MED ORDER — HEPARIN SODIUM (PORCINE) 1000 UNIT/ML IJ SOLN
INTRAMUSCULAR | Status: DC | PRN
  Administered 2021-09-06: 5000 [IU] via INTRAVENOUS
  Administered 2021-09-06 (×2): 2500 [IU] via INTRAVENOUS
  Administered 2021-09-06: 5000 [IU] via INTRAVENOUS

## 2021-09-06 MED ORDER — INSULIN ASPART 100 UNIT/ML IJ SOLN
0.0000 [IU] | Freq: Three times a day (TID) | INTRAMUSCULAR | Status: DC
Start: 2021-09-07 — End: 2021-09-07
  Administered 2021-09-07: 09:00:00 2 [IU] via SUBCUTANEOUS
  Administered 2021-09-07: 13:00:00 3 [IU] via SUBCUTANEOUS

## 2021-09-06 MED ORDER — SODIUM CHLORIDE 0.9 % WEIGHT BASED INFUSION: 1.0000 mL/kg/h | INTRAVENOUS | Status: DC

## 2021-09-06 MED ORDER — SODIUM CHLORIDE 0.9% FLUSH
3.0000 mL | Freq: Two times a day (BID) | INTRAVENOUS | Status: DC
  Administered 2021-09-07: 13:00:00 3 mL via INTRAVENOUS

## 2021-09-06 MED ORDER — LABETALOL HCL 5 MG/ML IV SOLN: 10.0000 mg | INTRAVENOUS | Status: AC | PRN

## 2021-09-06 MED ORDER — ASPIRIN 81 MG PO CHEW
324.0000 mg | CHEWABLE_TABLET | Freq: Once | ORAL | Status: AC
  Administered 2021-09-06: 03:00:00 324 mg via ORAL
  Filled 2021-09-06: qty 4

## 2021-09-06 MED ORDER — CLOPIDOGREL BISULFATE 300 MG PO TABS
ORAL_TABLET | ORAL | Status: DC | PRN
  Administered 2021-09-06: 600 mg via ORAL

## 2021-09-06 MED ORDER — ENOXAPARIN SODIUM 40 MG/0.4ML IJ SOSY: 40.0000 mg | PREFILLED_SYRINGE | INTRAMUSCULAR | Status: DC

## 2021-09-06 MED ORDER — HEPARIN (PORCINE) IN NACL 1000-0.9 UT/500ML-% IV SOLN
INTRAVENOUS | Status: DC | PRN
  Administered 2021-09-06 (×2): 500 mL

## 2021-09-06 MED ORDER — IRBESARTAN 150 MG PO TABS
300.0000 mg | ORAL_TABLET | Freq: Every day | ORAL | Status: DC
  Administered 2021-09-06 – 2021-09-07 (×2): 300 mg via ORAL
  Filled 2021-09-06: qty 1
  Filled 2021-09-06: qty 2

## 2021-09-06 MED ORDER — CLOPIDOGREL BISULFATE 75 MG PO TABS
75.0000 mg | ORAL_TABLET | Freq: Every day | ORAL | Status: DC
  Administered 2021-09-07: 09:00:00 75 mg via ORAL
  Filled 2021-09-06: qty 1

## 2021-09-06 MED ORDER — METOPROLOL TARTRATE 5 MG/5ML IV SOLN
INTRAVENOUS | Status: DC | PRN
  Administered 2021-09-06: 5 mg via INTRAVENOUS

## 2021-09-06 MED ORDER — LIDOCAINE HCL (PF) 1 % IJ SOLN
INTRAMUSCULAR | Status: AC
  Filled 2021-09-06: qty 30

## 2021-09-06 MED ORDER — HEPARIN (PORCINE) IN NACL 1000-0.9 UT/500ML-% IV SOLN
INTRAVENOUS | Status: AC
  Filled 2021-09-06: qty 1000

## 2021-09-06 MED ORDER — METOPROLOL SUCCINATE ER 50 MG PO TB24
50.0000 mg | ORAL_TABLET | Freq: Two times a day (BID) | ORAL | Status: DC
  Administered 2021-09-06 – 2021-09-07 (×2): 50 mg via ORAL
  Filled 2021-09-06 (×3): qty 1

## 2021-09-06 SURGICAL SUPPLY — 26 items
BALLN SAPPHIRE 2.0X12 (BALLOONS) ×3
BALLN SAPPHIRE 2.5X12 (BALLOONS) ×3
BALLN SAPPHIRE ~~LOC~~ 3.75X10 (BALLOONS) ×2 IMPLANT
BALLN ~~LOC~~ EUPHORA RX 3.5X8 (BALLOONS) ×3
BALLOON SAPPHIRE 2.0X12 (BALLOONS) IMPLANT
BALLOON SAPPHIRE 2.5X12 (BALLOONS) IMPLANT
BALLOON ~~LOC~~ EUPHORA RX 3.5X8 (BALLOONS) IMPLANT
CATH INFINITI 5 FR JL3.5 (CATHETERS) ×2 IMPLANT
CATH INFINITI JR4 5F (CATHETERS) ×2 IMPLANT
CATH VISTA GUIDE 6FR JR4 (CATHETERS) ×2 IMPLANT
DEVICE RAD COMP TR BAND LRG (VASCULAR PRODUCTS) ×2 IMPLANT
ELECT DEFIB PAD ADLT CADENCE (PAD) ×2 IMPLANT
GLIDESHEATH SLEND A-KIT 6F 22G (SHEATH) ×2 IMPLANT
GUIDEWIRE INQWIRE 1.5J.035X260 (WIRE) IMPLANT
INQWIRE 1.5J .035X260CM (WIRE) ×6
KIT ENCORE 26 ADVANTAGE (KITS) ×2 IMPLANT
KIT ESSENTIALS PG (KITS) ×2 IMPLANT
KIT HEART LEFT (KITS) ×3 IMPLANT
PACK CARDIAC CATHETERIZATION (CUSTOM PROCEDURE TRAY) ×3 IMPLANT
SHEATH PROBE COVER 6X72 (BAG) ×2 IMPLANT
STENT SYNERGY XD 3.0X20 (Permanent Stent) IMPLANT
SYNERGY XD 3.0X20 (Permanent Stent) ×3 IMPLANT
TRANSDUCER W/STOPCOCK (MISCELLANEOUS) ×3 IMPLANT
TUBING CIL FLEX 10 FLL-RA (TUBING) ×3 IMPLANT
WIRE ASAHI PROWATER 180CM (WIRE) ×4 IMPLANT
WIRE COUGAR XT STRL 190CM (WIRE) ×2 IMPLANT

## 2021-09-06 NOTE — Consult Note (Addendum)
Cardiology Consult    Patient ID: Keith [name] MRN: [id number], DOB/AGE: Nov 28, 1955   Admit date: 09/05/2021 Date of Consult: 09/06/2021  Primary Physician: Biagio Borg, MD Primary Cardiologist: None - previously Einar Crow, MD  Requesting Provider: Jerilynn Mages. Arrien, MD  Patient Profile    Keith [name] is a 65 y.o. male with a history of nonischemic cardiomyopathy, heart failure with improved ejection fraction (50% December 2015), nonobstructive coronary artery disease (minimal LAD disease-March 2010), multifocal atrial tachycardia, wandering atrial pacemaker, GERD, hypertension, hyperlipidemia, diabetes, and hypothyroidism, who is being seen today for the evaluation of unstable angina at the request of Dr. Cathlean Sauer.  Past Medical History   Past Medical History:  Diagnosis Date   Anxiety    Arthritis    Cervical spine pain    Chronic   Chronic HFimpEF (heart failure with improved ejection fraction) ([place])    a. 01/2010 Echo: EF 30%, diff HK; b. 08/2014 Echo: EF 50%, no rwma, GrI DD.   Diabetes mellitus    Diverticulosis    Fatty liver    GERD (gastroesophageal reflux disease)    Hereditary hemochromatosis    Compound heterozygote. Pt gets periodic phlebotomies   History of colonic polyps    History of NICM (nonischemic cardiomyopathy) w/ subsequent improvement in LV function([facility])    a. Echo (5/11) showed EF 30% with diffuse hypokinesis, normal wall thickness, mildly decreased RV systolic function. This may be a cardiomyopathy due to hemochromatosis versus tachy-mediated; b. 08/2014 Echo: EF 50%, no rwma, GrI DD.   Hyperlipidemia    Hypertension    Hypothyroidism    Low back pain    The pt is S/P spinal fusion surgeries   Multifocal atrial tachycardia ([place])    a. Holter monior in 5/11 showed frequent runs of symtomatic MAT with heart rate around 100-->prev on amio-->now on bb only.   Nonobstructive CAD (coronary artery disease)    a. 11/2008 MV: EF 50%, Inf HK, inferior  scar and ischemia in the apical anterior septum and in the inferior wall; b. 11/2008 Cath: LM nl, LAD min irregs, LCX nl, OM1/2 nl, RCA nl.   Peptic ulcer disease    Thyroid nodule    Wandering atrial pacemaker     Past Surgical History:  Procedure Laterality Date   INGUINAL HERNIA REPAIR Right    LUMBAR [place] SURGERY  09/15/1989   TONSILLECTOMY       Allergies  Allergies  Allergen Reactions   Dicyclomine Nausea And Vomiting and Other (See Comments)    Lightheaded, dizziness, SOB   Doxycycline Nausea Only    Weak, fatigue, "sick on stomach"   Lipitor [Atorvastatin]     myalgias   Penicillins Other (See Comments)    Childhood allergy - unknown reaction   Reglan [Metoclopramide] Other (See Comments)    Unknown reaction   Zocor [Simvastatin] Other (See Comments)    Myalgia    Zoloft [Sertraline Hcl] Other (See Comments)    Unknown reaction     History of Present Illness    65 y.o. male with a history of nonischemic cardiomyopathy, heart failure with improved ejection fraction (50% December 2015), nonobstructive coronary artery disease (minimal LAD disease-March 2010), multifocal atrial tachycardia, wandering atrial pacemaker, GERD, hypertension, hyperlipidemia, diabetes, and hypothyroidism.  Patient was previously evaluated in March 2010 in the setting of atypical chest pain. Stress testing was undertaken and was abnormal with inferior scar and ischemia as well as apical anterior ischemia.  He then underwent diagnostic catheterization which  showed only minor irregularities in the LAD and otherwise normal coronaries.  A follow-up echocardiogram in May 2011 showed reduced EF of 30% with diffuse hypokinesis.  He was treated as a nonischemic cardiomyopathy, potentially felt to be secondary to multifocal atrial tachycardia and/or hereditary hemochromatosis.  Atrial tachycardia was noted on May 2011 Holter monitor, and was initially managed with amiodarone but this was subsequently  discontinued secondary to intolerance (felt off balance).  He has since been managed with oral beta-blocker only and notes no known recurrence of palpitations/atrial tachycardia.  A follow-up echocardiogram in December 2015 showed an EF of 50% with no regional wall motion abnormalities grade 1 diastolic dysfunction.  He has not been seen in cardiology clinic since 2016.  Over the years, Mr. Riechers has done well.  He lives locally by himself.  He does not routinely exercise but is active without symptoms or limitations.  He does have a history of GERD and notes that after certain food items, he will have to take Tums, but that only occurs about once every few months.  He was in his usual state of health until approximately 5 to 7 days ago, when he began to experience exertional substernal chest burning and pressure without associated symptoms, lasting between 5 and 20 minutes, and resolving with rest.  Symptoms seem to occur with less and less provocation over the past few days and on December 22, while walking through [place], he had recurrent discomfort that persisted about 15 to 20 minutes and resolved while he was waiting in line to check out.  Due to progressive symptoms, he presented to the [facility] emergency department where his ECG showed no acute ST or T changes and troponins have been normal.  He has had recurrent chest discomfort with ambulation to and from the bathroom but is currently pain-free.  Inpatient Medications     amLODipine  5 mg Oral Daily   aspirin EC  81 mg Oral Daily   enoxaparin (LOVENOX) injection  40 mg Subcutaneous Q24H   fenofibrate  160 mg Oral Daily   irbesartan  300 mg Oral Daily   levothyroxine  25 mcg Oral Q0600   pantoprazole  40 mg Oral Daily    Family History    Family History  Problem Relation Age of Onset   Colon polyps Mother    Multiple sclerosis Mother    Diabetes Mother    Colitis Mother    Hypertension Father    Alcohol abuse Father     Multiple sclerosis Sister    Diabetes Paternal Grandmother    Diabetes Paternal Grandfather    Colon cancer Neg Hx    He indicated that his mother is deceased. He indicated that his father is deceased. He indicated that all of his three sisters are alive. He indicated that his maternal grandmother is deceased. He indicated that his maternal grandfather is deceased. He indicated that his paternal grandmother is deceased. He indicated that his paternal grandfather is deceased. He indicated that the status of his neg hx is unknown.   Social History    Social History   Socioeconomic History   Marital status: Married    Spouse name: Not on file   Number of children: 3   Years of education: Not on file   Highest education level: Not on file  Occupational History   Occupation: Disabled    Employer: UNEMPLOYED  Tobacco Use   Smoking status: Former    Types: Cigarettes    Quit date:  09/16/2003    Years since quitting: 17.9   Smokeless tobacco: Never  Vaping Use   Vaping Use: Never used  Substance and Sexual Activity   Alcohol use: Not Currently    Comment: social - 1 drink every few months.   Drug use: Not Currently    Comment: Quit in 1980   Sexual activity: Yes  Other Topics Concern   Not on file  Social History Narrative   Lives in [place] by himself.  Does not routinely exercise.   Social Determinants of Health   Financial Resource Strain: Not on file  Food Insecurity: Not on file  Transportation Needs: Not on file  Physical Activity: Not on file  Stress: Not on file  Social Connections: Not on file  Intimate Partner Violence: Not on file     Review of Systems    General:  No chills, fever, night sweats or weight changes.  Cardiovascular:  +++ Exertional chest pain, no dyspnea on exertion, edema, orthopnea, palpitations, paroxysmal nocturnal dyspnea. Dermatological: No rash, lesions/masses Respiratory: No cough, dyspnea Urologic: No hematuria, dysuria Abdominal:    No nausea, vomiting, diarrhea, bright red blood per rectum, melena, or hematemesis Neurologic:  No visual changes, wkns, changes in mental status. All other systems reviewed and are otherwise negative except as noted above.  Physical Exam    Blood pressure 130/70, pulse 87, temperature 99 F (37.2 C), temperature source Oral, resp. rate 19, height 5\' 11"  (1.803 m), weight 104.3 kg, SpO2 92 %.  General: Pleasant, NAD Psych: Normal affect. Neuro: Alert and oriented X 3. Moves all extremities spontaneously. HEENT: Normal  Neck: Supple without bruits or JVD. Lungs:  Resp regular and unlabored, CTA. Heart: RRR no s3, s4, or murmurs. Abdomen: Soft, non-tender, non-distended, BS + x 4.  Extremities: No clubbing, cyanosis or edema. DP/PT2+, Radials 2+ and equal bilaterally.  Labs    Cardiac Enzymes Recent Labs  Lab 09/05/21 2220 09/06/21 0248  TROPONINIHS 4 4      Lab Results  Component Value Date   WBC 10.4 09/05/2021   HGB 14.5 09/05/2021   HCT 43.5 09/05/2021   MCV 92.2 09/05/2021   PLT 268 09/05/2021    Recent Labs  Lab 09/05/21 2220  NA 138  K 3.8  CL 108  CO2 23  BUN 19  CREATININE 1.19  CALCIUM 9.3  PROT 7.8  BILITOT 0.7  ALKPHOS 43  ALT 26  AST 24  GLUCOSE 132*   Lab Results  Component Value Date   CHOL 195 04/15/2021   HDL 33.50 (L) 04/15/2021   LDLCALC 130 (H) 04/15/2021   TRIG 159.0 (H) 04/15/2021     Radiology Studies    DG Chest 2 View  Result Date: 09/06/2021 CLINICAL DATA:  Mid chest pain. EXAM: CHEST - 2 VIEW COMPARISON:  November 11, 2017 FINDINGS: Very mild linear atelectasis is seen within the left lung base. There is no evidence of acute infiltrate, pleural effusion or pneumothorax. The heart size and mediastinal contours are within normal limits. The visualized skeletal structures are unremarkable. IMPRESSION: Very mild left basilar linear atelectasis. Electronically Signed   By: Virgina Norfolk M.D.   On: 09/06/2021 02:47    ECG &  Cardiac Imaging    Regular sinus rhythm, 85, PAC, no acute ST or T changes- personally reviewed.  Assessment & Plan    1.  Unstable angina/history of nonobstructive CAD: Patient with a prior history of nonobstructive CAD status post catheterization in 2010 revealing minimal LAD  disease and otherwise normal coronary arteries.  Catheterization was performed at that time in the setting of atypical chest pain and abnormal stress testing.  He has done well over the years and has remained active without significant limitations however, over the past 5 days, he has been experiencing progressive exertional angina without associated symptoms, lasting 5 to 20 minutes, resolving with rest.  He had a particularly bad episode while walking through [place] yesterday, prompting him to present to the emergency department.  Here, ECG was without acute ST or T changes.  Troponin has been normal at 4  4.  He has had recurrent chest pain with ambulation to and from the bathroom while in the emergency department.  He is currently chest pain-free at rest.  Symptoms and ongoing risk factors of hypertension, hyperlipidemia, diabetes, and remote tobacco abuse, or concerning for obstructive coronary artery disease.  We discussed options for management and we will plan on diagnostic catheterization today.  The patient understands that risks include but are not limited to stroke (1 in 1000), death (1 in 43), kidney failure [usually temporary] (1 in 500), bleeding (1 in 200), allergic reaction [possibly serious] (1 in 200), and agrees to proceed.  Continue aspirin.  Resume home dose of beta-blocker.  He reports statin intolerance having previously tried simvastatin and atorvastatin.  I will add low-dose rosuvastatin to see if he tolerates.  2.  Essential hypertension: Relatively stable at 130/70.  Continue home medications.  3.  Hyperlipidemia: With statin intolerance.  Did not previously tolerate atorvastatin or simvastatin.  We  will try low-dose rosuvastatin in the setting of #1.  4.  Type 2 diabetes mellitus: On multiple oral agents at home.  Glucose 132 last night.  Management per primary team.  5.  History of nonischemic cardiomyopathy/heart failure with improved ejection fraction: EF as low as 30% in 2011 felt potentially to be secondary to a history of multifocal atrial tachycardia and/or hemochromatosis.  He has been on beta-blocker therapy related to multifocal atrial tachycardia and notes quiescence of arrhythmias.  Most recent EF was 50% by echo in December 2015.  He is euvolemic on examination and denies any history of dyspnea on exertion, edema, or orthopnea.  Continue home dose of beta-blocker and ARB therapy.  Follow-up echo.  6.  Hemochromatosis: He has not required phlebotomy in several years.  H&H stable at 14.5/43.5.  7.  Hypothyroidism: On levothyroxine as an outpatient.  8.  GERD: Continue PPI.  Patient has been using Rolaids more frequently without relief of exertional symptoms.  9.  Multifocal atrial tachycardia: Patient with prior history of MAT noted on Holter monitoring in 2011.  This was initially managed with amiodarone however, that was discontinued secondary to intolerance.  He is on beta-blocker therapy as an outpatient and notes quiescence of arrhythmias.  Signed, Murray Hodgkins, NP 09/06/2021, 8:53 AM  For questions or updates, please contact   Please consult [email or web address] for contact info under Cardiology/STEMI. As above, patient seen and examined.  Briefly he is a 65 year old male with past medical history of nonischemic cardiomyopathy improved, nonobstructive coronary disease, multifocal atrial tachycardia, hypertension, hyperlipidemia, diabetes mellitus, hypothyroidism with unstable angina.  Patient states that for the past week he has had diffuse chest pain with activities relieved with rest.  It radiates to his back.  No associated symptoms.  His symptoms worsened and he presented  to the emergency room and cardiology asked to evaluate. Troponins are 4 and 4.  Creatinine 1.19, hemoglobin 14.5, electrocardiogram shows sinus  rhythm with PAC and no ST changes. 1 unstable angina-patient presents with symptoms concerning for new onset angina.  Also multiple risk factors including diabetes mellitus, hypertension and hyperlipidemia.  We will plan cardiac catheterization for definitive evaluation.  The risk and benefits including myocardial infarction, CVA and death discussed and he agrees to proceed.  Continue aspirin and beta-blocker.  We will add low-dose Crestor (he did not tolerate Lipitor or Zocor previously).  2 hypertension-we will continue preadmission blood pressure medications and follow.  3 hyperlipidemia-as above we will try low-dose Crestor.  If he does not tolerate we will need Repatha or Praluent.  Kirk Ruths, MD

## 2021-09-06 NOTE — Assessment & Plan Note (Signed)
Continue toprol-xl, norasc and arb.

## 2021-09-06 NOTE — H&P (View-Only) (Signed)
Cardiology Consult    Patient ID: [name] MRN: [id number], DOB/AGE: 01-27-1956   Admit date: 09/05/2021 Date of Consult: 09/06/2021  Primary Physician: Biagio Borg, MD Primary Cardiologist: None - previously Einar Crow, MD  Requesting Provider: Jerilynn Mages. Arrien, MD  Patient Profile    Keith [name] is a 65 y.o. male with a history of nonischemic cardiomyopathy, heart failure with improved ejection fraction (50% December 2015), nonobstructive coronary artery disease (minimal LAD disease-March 2010), multifocal atrial tachycardia, wandering atrial pacemaker, GERD, hypertension, hyperlipidemia, diabetes, and hypothyroidism, who is being seen today for the evaluation of unstable angina at the request of Dr. Cathlean Sauer.  Past Medical History   Past Medical History:  Diagnosis Date   Anxiety    Arthritis    Cervical spine pain    Chronic   Chronic HFimpEF (heart failure with improved ejection fraction) ([place])    a. 01/2010 Echo: EF 30%, diff HK; b. 08/2014 Echo: EF 50%, no rwma, GrI DD.   Diabetes mellitus    Diverticulosis    Fatty liver    GERD (gastroesophageal reflux disease)    Hereditary hemochromatosis    Compound heterozygote. Pt gets periodic phlebotomies   History of colonic polyps    History of NICM (nonischemic cardiomyopathy) w/ subsequent improvement in LV function([facility])    a. Echo (5/11) showed EF 30% with diffuse hypokinesis, normal wall thickness, mildly decreased RV systolic function. This may be a cardiomyopathy due to hemochromatosis versus tachy-mediated; b. 08/2014 Echo: EF 50%, no rwma, GrI DD.   Hyperlipidemia    Hypertension    Hypothyroidism    Low back pain    The pt is S/P spinal fusion surgeries   Multifocal atrial tachycardia ([place])    a. Holter monior in 5/11 showed frequent runs of symtomatic MAT with heart rate around 100-->prev on amio-->now on bb only.   Nonobstructive CAD (coronary artery disease)    a. 11/2008 MV: EF 50%, Inf HK, inferior  scar and ischemia in the apical anterior septum and in the inferior wall; b. 11/2008 Cath: LM nl, LAD min irregs, LCX nl, OM1/2 nl, RCA nl.   Peptic ulcer disease    Thyroid nodule    Wandering atrial pacemaker     Past Surgical History:  Procedure Laterality Date   INGUINAL HERNIA REPAIR Right    LUMBAR [place] SURGERY  09/15/1989   TONSILLECTOMY       Allergies  Allergies  Allergen Reactions   Dicyclomine Nausea And Vomiting and Other (See Comments)    Lightheaded, dizziness, SOB   Doxycycline Nausea Only    Weak, fatigue, "sick on stomach"   Lipitor [Atorvastatin]     myalgias   Penicillins Other (See Comments)    Childhood allergy - unknown reaction   Reglan [Metoclopramide] Other (See Comments)    Unknown reaction   Zocor [Simvastatin] Other (See Comments)    Myalgia    Zoloft [Sertraline Hcl] Other (See Comments)    Unknown reaction     History of Present Illness    65 y.o. male with a history of nonischemic cardiomyopathy, heart failure with improved ejection fraction (50% December 2015), nonobstructive coronary artery disease (minimal LAD disease-March 2010), multifocal atrial tachycardia, wandering atrial pacemaker, GERD, hypertension, hyperlipidemia, diabetes, and hypothyroidism.  Patient was previously evaluated in March 2010 in the setting of atypical chest pain. Stress testing was undertaken and was abnormal with inferior scar and ischemia as well as apical anterior ischemia.  He then underwent diagnostic catheterization which  showed only minor irregularities in the LAD and otherwise normal coronaries.  A follow-up echocardiogram in May 2011 showed reduced EF of 30% with diffuse hypokinesis.  He was treated as a nonischemic cardiomyopathy, potentially felt to be secondary to multifocal atrial tachycardia and/or hereditary hemochromatosis.  Atrial tachycardia was noted on May 2011 Holter monitor, and was initially managed with amiodarone but this was subsequently  discontinued secondary to intolerance (felt off balance).  He has since been managed with oral beta-blocker only and notes no known recurrence of palpitations/atrial tachycardia.  A follow-up echocardiogram in December 2015 showed an EF of 50% with no regional wall motion abnormalities grade 1 diastolic dysfunction.  He has not been seen in cardiology clinic since 2016.  Over the years, [name] has done well.  He lives locally by himself.  He does not routinely exercise but is active without symptoms or limitations.  He does have a history of GERD and notes that after certain food items, he will have to take Tums, but that only occurs about once every few months.  He was in his usual state of health until approximately 5 to 7 days ago, when he began to experience exertional substernal chest burning and pressure without associated symptoms, lasting between 5 and 20 minutes, and resolving with rest.  Symptoms seem to occur with less and less provocation over the past few days and on December 22, while walking through [place], he had recurrent discomfort that persisted about 15 to 20 minutes and resolved while he was waiting in line to check out.  Due to progressive symptoms, he presented to [facility] emergency department where his ECG showed no acute ST or T changes and troponins have been normal.  He has had recurrent chest discomfort with ambulation to and from the bathroom but is currently pain-free.  Inpatient Medications     amLODipine  5 mg Oral Daily   aspirin EC  81 mg Oral Daily   enoxaparin (LOVENOX) injection  40 mg Subcutaneous Q24H   fenofibrate  160 mg Oral Daily   irbesartan  300 mg Oral Daily   levothyroxine  25 mcg Oral Q0600   pantoprazole  40 mg Oral Daily    Family History    Family History  Problem Relation Age of Onset   Colon polyps Mother    Multiple sclerosis Mother    Diabetes Mother    Colitis Mother    Hypertension Father    Alcohol abuse Father     Multiple sclerosis Sister    Diabetes Paternal Grandmother    Diabetes Paternal Grandfather    Colon cancer Neg Hx    He indicated that his mother is deceased. He indicated that his father is deceased. He indicated that all of his three sisters are alive. He indicated that his maternal grandmother is deceased. He indicated that his maternal grandfather is deceased. He indicated that his paternal grandmother is deceased. He indicated that his paternal grandfather is deceased. He indicated that the status of his neg hx is unknown.   Social History    Social History   Socioeconomic History   Marital status: Married    Spouse name: Not on file   Number of children: 3   Years of education: Not on file   Highest education level: Not on file  Occupational History   Occupation: Disabled    Employer: UNEMPLOYED  Tobacco Use   Smoking status: Former    Types: Cigarettes    Quit date:  09/16/2003    Years since quitting: 17.9   Smokeless tobacco: Never  Vaping Use   Vaping Use: Never used  Substance and Sexual Activity   Alcohol use: Not Currently    Comment: social - 1 drink every few months.   Drug use: Not Currently    Comment: Quit in 1980   Sexual activity: Yes  Other Topics Concern   Not on file  Social History Narrative   Lives in [place] by himself.  Does not routinely exercise.   Social Determinants of Health   Financial Resource Strain: Not on file  Food Insecurity: Not on file  Transportation Needs: Not on file  Physical Activity: Not on file  Stress: Not on file  Social Connections: Not on file  Intimate Partner Violence: Not on file     Review of Systems    General:  No chills, fever, night sweats or weight changes.  Cardiovascular:  +++ Exertional chest pain, no dyspnea on exertion, edema, orthopnea, palpitations, paroxysmal nocturnal dyspnea. Dermatological: No rash, lesions/masses Respiratory: No cough, dyspnea Urologic: No hematuria, dysuria Abdominal:    No nausea, vomiting, diarrhea, bright red blood per rectum, melena, or hematemesis Neurologic:  No visual changes, wkns, changes in mental status. All other systems reviewed and are otherwise negative except as noted above.  Physical Exam    Blood pressure 130/70, pulse 87, temperature 99 F (37.2 C), temperature source Oral, resp. rate 19, height 5\' 11"  (1.803 m), weight 104.3 kg, SpO2 92 %.  General: Pleasant, NAD Psych: Normal affect. Neuro: Alert and oriented X 3. Moves all extremities spontaneously. HEENT: Normal  Neck: Supple without bruits or JVD. Lungs:  Resp regular and unlabored, CTA. Heart: RRR no s3, s4, or murmurs. Abdomen: Soft, non-tender, non-distended, BS + x 4.  Extremities: No clubbing, cyanosis or edema. DP/PT2+, Radials 2+ and equal bilaterally.  Labs    Cardiac Enzymes Recent Labs  Lab 09/05/21 2220 09/06/21 0248  TROPONINIHS 4 4      Lab Results  Component Value Date   WBC 10.4 09/05/2021   HGB 14.5 09/05/2021   HCT 43.5 09/05/2021   MCV 92.2 09/05/2021   PLT 268 09/05/2021    Recent Labs  Lab 09/05/21 2220  NA 138  K 3.8  CL 108  CO2 23  BUN 19  CREATININE 1.19  CALCIUM 9.3  PROT 7.8  BILITOT 0.7  ALKPHOS 43  ALT 26  AST 24  GLUCOSE 132*   Lab Results  Component Value Date   CHOL 195 04/15/2021   HDL 33.50 (L) 04/15/2021   LDLCALC 130 (H) 04/15/2021   TRIG 159.0 (H) 04/15/2021     Radiology Studies    DG Chest 2 View  Result Date: 09/06/2021 CLINICAL DATA:  Mid chest pain. EXAM: CHEST - 2 VIEW COMPARISON:  November 11, 2017 FINDINGS: Very mild linear atelectasis is seen within the left lung base. There is no evidence of acute infiltrate, pleural effusion or pneumothorax. The heart size and mediastinal contours are within normal limits. The visualized skeletal structures are unremarkable. IMPRESSION: Very mild left basilar linear atelectasis. Electronically Signed   By: Virgina Norfolk M.D.   On: 09/06/2021 02:47    ECG &  Cardiac Imaging    Regular sinus rhythm, 85, PAC, no acute ST or T changes- personally reviewed.  Assessment & Plan    1.  Unstable angina/history of nonobstructive CAD: Patient with a prior history of nonobstructive CAD status post catheterization in 2010 revealing minimal LAD  disease and otherwise normal coronary arteries.  Catheterization was performed at that time in the setting of atypical chest pain and abnormal stress testing.  He has done well over the years and has remained active without significant limitations however, over the past 5 days, he has been experiencing progressive exertional angina without associated symptoms, lasting 5 to 20 minutes, resolving with rest.  He had a particularly bad episode while walking through [place] yesterday, prompting him to present to the emergency department.  Here, ECG was without acute ST or T changes.  Troponin has been normal at 4  4.  He has had recurrent chest pain with ambulation to and from the bathroom while in the emergency department.  He is currently chest pain-free at rest.  Symptoms and ongoing risk factors of hypertension, hyperlipidemia, diabetes, and remote tobacco abuse, or concerning for obstructive coronary artery disease.  We discussed options for management and we will plan on diagnostic catheterization today.  The patient understands that risks include but are not limited to stroke (1 in 1000), death (1 in 29), kidney failure [usually temporary] (1 in 500), bleeding (1 in 200), allergic reaction [possibly serious] (1 in 200), and agrees to proceed.  Continue aspirin.  Resume home dose of beta-blocker.  He reports statin intolerance having previously tried simvastatin and atorvastatin.  I will add low-dose rosuvastatin to see if he tolerates.  2.  Essential hypertension: Relatively stable at 130/70.  Continue home medications.  3.  Hyperlipidemia: With statin intolerance.  Did not previously tolerate atorvastatin or simvastatin.  We  will try low-dose rosuvastatin in the setting of #1.  4.  Type 2 diabetes mellitus: On multiple oral agents at home.  Glucose 132 last night.  Management per primary team.  5.  History of nonischemic cardiomyopathy/heart failure with improved ejection fraction: EF as low as 30% in 2011 felt potentially to be secondary to a history of multifocal atrial tachycardia and/or hemochromatosis.  He has been on beta-blocker therapy related to multifocal atrial tachycardia and notes quiescence of arrhythmias.  Most recent EF was 50% by echo in December 2015.  He is euvolemic on examination and denies any history of dyspnea on exertion, edema, or orthopnea.  Continue home dose of beta-blocker and ARB therapy.  Follow-up echo.  6.  Hemochromatosis: He has not required phlebotomy in several years.  H&H stable at 14.5/43.5.  7.  Hypothyroidism: On levothyroxine as an outpatient.  8.  GERD: Continue PPI.  Patient has been using Rolaids more frequently without relief of exertional symptoms.  9.  Multifocal atrial tachycardia: Patient with prior history of MAT noted on Holter monitoring in 2011.  This was initially managed with amiodarone however, that was discontinued secondary to intolerance.  He is on beta-blocker therapy as an outpatient and notes quiescence of arrhythmias.  Signed, Murray Hodgkins, NP 09/06/2021, 8:53 AM  For questions or updates, please contact   Please consult [email or web address] for contact info under Cardiology/STEMI. As above, patient seen and examined.  Briefly he is a 65 year old male with past medical history of nonischemic cardiomyopathy improved, nonobstructive coronary disease, multifocal atrial tachycardia, hypertension, hyperlipidemia, diabetes mellitus, hypothyroidism with unstable angina.  Patient states that for the past week he has had diffuse chest pain with activities relieved with rest.  It radiates to his back.  No associated symptoms.  His symptoms worsened and he presented  to the emergency room and cardiology asked to evaluate. Troponins are 4 and 4.  Creatinine 1.19, hemoglobin 14.5, electrocardiogram shows sinus  rhythm with PAC and no ST changes. 1 unstable angina-patient presents with symptoms concerning for new onset angina.  Also multiple risk factors including diabetes mellitus, hypertension and hyperlipidemia.  We will plan cardiac catheterization for definitive evaluation.  The risk and benefits including myocardial infarction, CVA and death discussed and he agrees to proceed.  Continue aspirin and beta-blocker.  We will add low-dose Crestor (he did not tolerate Lipitor or Zocor previously).  2 hypertension-we will continue preadmission blood pressure medications and follow.  3 hyperlipidemia-as above we will try low-dose Crestor.  If he does not tolerate we will need Repatha or Praluent.  Kirk Ruths, MD

## 2021-09-06 NOTE — Progress Notes (Signed)
°  Echocardiogram 2D Echocardiogram has been performed.  Darlina Sicilian M 09/06/2021, 11:12 AM

## 2021-09-06 NOTE — Assessment & Plan Note (Signed)
Stable

## 2021-09-06 NOTE — H&P (Signed)
History and Physical    Keith [name] OQH:[id number] DOB: 1956-06-26 DOA: 09/05/2021  PCP: Biagio Borg, MD   Patient coming from: Home  I have personally briefly reviewed patient's old medical records in [place]  CC: chest pain HPI: 65 year old white male with a history of type 2 diabetes, hypertension, hyperlipidemia, hemochromatosis, prior history of nonischemic cardiomyopathy in 2011 reported EF of 30% presents to the ER today with exertional chest pain for the last 5 days.  Patient states that he walks around 50 yards before he starts getting centralized chest pain.  No radiation of pain.  No nausea.  No vomiting.  Took about 5 minutes for his chest pain to go away.  His pain is only occasionally reproducible.  Patient states that he was at [facility] home improvement with his brother yesterday and walk around the store for about 30 minutes without any chest pain.  He went to [facility] later on that day and was barely able to walk from his car to the front door and had chest pain.  He states that been going on for the last for 5 days.  Started making concerned as this was happening more frequently.  Denies any other recent symptoms including nausea, vomiting, diarrhea.  First set of troponins were negative.  EKG without any acute ST changes.  EDP stated that he was going to talk with cardiology about ischemic testing strategy.  [facility] hospital[facility] contacted for admission.   ED Course: first set troponin negative. EKG without acute ST changes.  Review of Systems:  Review of Systems  Constitutional: Negative.  Negative for chills, fever and weight loss.  HENT: Negative.  Negative for ear pain, hearing loss and tinnitus.   Eyes: Negative.  Negative for blurred vision, double vision and photophobia.  Respiratory:  Positive for shortness of breath.   Cardiovascular:  Positive for chest pain. Negative for palpitations, orthopnea, claudication, leg swelling and PND.   Gastrointestinal:  Negative for abdominal pain, heartburn, nausea and vomiting.  Genitourinary: Negative.  Negative for dysuria, frequency and urgency.  Musculoskeletal: Negative.  Negative for back pain, myalgias and neck pain.  Skin: Negative.  Negative for itching and rash.  Neurological: Negative.   Endo/Heme/Allergies: Negative.   Psychiatric/Behavioral: Negative.    All other systems reviewed and are negative.  Past Medical History:  Diagnosis Date   Anxiety    Arthritis    Cardiomyopathy    Echo (5/11) showed EF 30% with diffuse hypokinesis, normal wall thickness, mildly decreased RV systolic function. This may be a cardiomyopathy due to hemochromatosis versus tachy-mediated. Unable to get cardiac MRI due to claustrophobia.   Cervical spine pain    Chronic   Chest pain    Exercise/adenosine Myoview showed EF 50% (3/10). Inferior hypokinesis. Perfusion images, inferior scar and ischemia in the apical anterior septum and in teh inferior wall . Suggestive of a CD. LHC (3/10) showed only lu minal irregulatrities in the coronaries w/ EF 55%, suggesting that myoview was a false positive, likely from diaphragmatic attnuation.   Diabetes mellitus    Diverticulosis    Fatty liver    GERD (gastroesophageal reflux disease)    Heart murmur    Hereditary hemochromatosis    Compound heterozygote. Pt gets periodic phlebotomies   History of colonic polyps    Hyperlipidemia    Hypertension    Low back pain    The pt is S/P spinal fusion surgeries   Multifocal atrial tachycardia (HCC)    Wandering atrial  pacemaker. Holter monior in 5/11 showed frequent runs of symtomatic MAT with heart rate around 100. Pt is now  on amiodarone to try to suppress MAT. PFTs (6/11): FVC 105%, FEV1 109%, TLC 112%, DLCO 87% (minimal obstructive effect). LFTs stable on amiodarone   Peptic ulcer disease    Thyroid nodule     Past Surgical History:  Procedure Laterality Date   INGUINAL HERNIA REPAIR Right     LUMBAR [place] SURGERY  09/15/1989   TONSILLECTOMY       reports that he quit smoking about 17 years ago. His smoking use included cigarettes. He has never used smokeless tobacco. He reports current alcohol use of about 1.0 standard drink per week. He reports current drug use.  Allergies  Allergen Reactions   Dicyclomine Nausea And Vomiting and Other (See Comments)    Lightheaded, dizziness, SOB   Doxycycline Nausea Only    Weak, fatigue, "sick on stomach"   Lipitor [Atorvastatin]     myalgias   Penicillins Other (See Comments)    Childhood allergy - unknown reaction   Reglan [Metoclopramide] Other (See Comments)    Unknown reaction   Zocor [Simvastatin] Other (See Comments)    Myalgia    Zoloft [Sertraline Hcl] Other (See Comments)    Unknown reaction     Family History  Problem Relation Age of Onset   Colon polyps Mother    Multiple sclerosis Mother    Diabetes Mother    Colitis Mother    Hypertension Father    Alcohol abuse Father    Multiple sclerosis Sister    Diabetes Paternal Grandmother    Diabetes Paternal Grandfather    Colon cancer Neg Hx     Prior to Admission medications   Medication Sig Start Date End Date Taking? Authorizing Provider  amLODipine (NORVASC) 5 MG tablet Take 1 tablet (5 mg total) by mouth daily. 10/12/20 10/12/21 Yes Biagio Borg, MD  aspirin 81 MG EC tablet Take 81 mg by mouth daily.   Yes [provider]  Cholecalciferol (VITAMIN D-3 PO) Take 2,000 Units by mouth daily.   Yes [provider]  fenofibrate (TRICOR) 145 MG tablet TAKE 1 TABLET EVERY DAY 07/10/21  Yes Biagio Borg, MD  glipiZIDE (GLUCOTROL XL) 10 MG 24 hr tablet TAKE 1 TABLET BY MOUTH DAILY WITH BREAKFAST. NEED APPOINTMENT. 01/18/21  Yes Biagio Borg, MD  glucosamine-chondroitin 500-400 MG tablet Take 1 tablet by mouth daily.   Yes [provider]  levothyroxine (SYNTHROID) 25 MCG tablet TAKE 1 TABLET EVERY DAY (NEED MD APPOINTMENT) 01/18/21  Yes Biagio Borg, MD  metFORMIN (GLUCOPHAGE-XR) 500 MG 24 hr tablet TAKE 4 TABLETS EVERY DAY WITH BREAKFAST 01/18/21  Yes Biagio Borg, MD  metoprolol succinate (TOPROL-XL) 100 MG 24 hr tablet TAKE 1 TABLET BY MOUTH TWICE DAILY. NEED APPOINTMENT. 01/18/21  Yes Biagio Borg, MD  olmesartan (BENICAR) 40 MG tablet Take 1 tablet (40 mg total) by mouth daily. 10/12/20  Yes Biagio Borg, MD  Omega-3 Fatty Acids (FISH OIL PO) Take 1 tablet by mouth daily.    Yes [provider]  pantoprazole (PROTONIX) 40 MG tablet TAKE 1 TABLET BY MOUTH DAILY. ANNUAL APPOINTMENT DUE IN [facility]. MUST SEE PROVIDER FOR FUTURE REFILLS. 01/18/21  Yes Biagio Borg, MD  pioglitazone (ACTOS) 45 MG tablet TAKE 1 TABLET BY MOUTH DAILY. ANNUAL APPT DUE IN [place], [place] SEE PROVIDER FOR FUTURE REFILLS. 01/18/21  Yes Biagio Borg, MD  tadalafil (CIALIS)  20 MG tablet Take 1 tablet (20 mg total) by mouth daily as needed for erectile dysfunction. 04/17/21 09/06/22 Yes Biagio Borg, MD  Alcohol Swabs (B-D SINGLE USE SWABS REGULAR) PADS Use as directed once daily E11.9 07/11/21   Biagio Borg, MD  Blood Glucose Calibration (TRUE METRIX LEVEL 1) Low SOLN Use as directed once daily E11.9 07/11/21   Biagio Borg, MD  Blood Glucose Monitoring Suppl (TRUE METRIX METER) w/Device KIT Use as directed once daily E11.9 07/11/21   Biagio Borg, MD  diazepam (VALIUM) 5 MG tablet Take 1 tablet (5 mg total) by mouth daily as needed for muscle spasms or sedation. 05/12/18   Biagio Borg, MD  DROPLET PEN NEEDLES 32G X 4 MM MISC  11/19/20   [provider]  glucose blood (RELION TRUE METRIX TEST STRIPS) test strip Use as instructed once daily E11.9 07/11/21   Biagio Borg, MD  TRUEplus Lancets 33G MISC Use as directed once daily E11.9 07/11/21   Biagio Borg, MD    Physical Exam: Vitals:   09/05/21 2306 09/06/21 0034 09/06/21 0230 09/06/21 0300  BP: (!) 173/91 (!) 160/88 (!) 146/89 130/86  Pulse: 84 88 83 75  Resp: '18 17  14  ' Temp: 99 F (37.2 C)      TempSrc: Oral     SpO2: 95% 94% 98% 94%  Weight:      Height:        Physical Exam Vitals and nursing note reviewed.  Constitutional:      General: He is not in acute distress.    Appearance: Normal appearance. He is obese. He is not ill-appearing, toxic-appearing or diaphoretic.  HENT:     Head: Normocephalic and atraumatic.     Nose: Nose normal. No rhinorrhea.  Eyes:     General:        Right eye: No discharge.        Left eye: No discharge.  Cardiovascular:     Rate and Rhythm: Normal rate and regular rhythm.     Pulses: Decreased pulses.          Dorsalis pedis pulses are 1+ on the right side and 1+ on the left side.       Posterior tibial pulses are 0 on the right side and 0 on the left side.  Pulmonary:     Effort: Pulmonary effort is normal. No respiratory distress.     Breath sounds: No wheezing or rales.  Abdominal:     General: Abdomen is protuberant. Bowel sounds are normal. There is no distension.     Palpations: Abdomen is soft.     Tenderness: There is no abdominal tenderness. There is no guarding or rebound.  Musculoskeletal:     Right lower leg: No edema.     Left lower leg: No edema.  Skin:    General: Skin is warm and dry.     Capillary Refill: Capillary refill takes less than 2 seconds.  Neurological:     General: No focal deficit present.     Mental Status: He is alert and oriented to person, place, and time.     Labs on Admission: I have personally reviewed following labs and imaging studies  CBC: Recent Labs  Lab 09/05/21 2220  WBC 10.4  NEUTROABS 6.4  HGB 14.5  HCT 43.5  MCV 92.2  PLT 826   Basic Metabolic Panel: Recent Labs  Lab 09/05/21 2220  NA 138  K 3.8  CL 108  CO2 23  GLUCOSE 132*  BUN 19  CREATININE 1.19  CALCIUM 9.3   GFR: Estimated Creatinine Clearance: 77.1 mL/min (by C-G formula based on SCr of 1.19 mg/dL). Liver Function Tests: Recent Labs  Lab 09/05/21 2220  AST 24  ALT 26  ALKPHOS 43  BILITOT 0.7   PROT 7.8  ALBUMIN 4.5   Recent Labs  Lab 09/05/21 2220  LIPASE 39   No results for input(s): AMMONIA in the last 168 hours. Coagulation Profile: No results for input(s): INR, PROTIME in the last 168 hours. Cardiac Enzymes: No results for input(s): CKTOTAL, CKMB, CKMBINDEX, TROPONINI in the last 168 hours. BNP (last 3 results) No results for input(s): PROBNP in the last 8760 hours. HbA1C: No results for input(s): HGBA1C in the last 72 hours. CBG: No results for input(s): GLUCAP in the last 168 hours. Lipid Profile: No results for input(s): CHOL, HDL, LDLCALC, TRIG, CHOLHDL, LDLDIRECT in the last 72 hours. Thyroid Function Tests: No results for input(s): TSH, T4TOTAL, FREET4, T3FREE, THYROIDAB in the last 72 hours. Anemia Panel: No results for input(s): VITAMINB12, FOLATE, FERRITIN, TIBC, IRON, RETICCTPCT in the last 72 hours. Urine analysis:    Component Value Date/Time   COLORURINE YELLOW 10/10/2020 1017   APPEARANCEUR CLEAR 10/10/2020 1017   LABSPEC 1.010 10/10/2020 1017   PHURINE 6.0 10/10/2020 1017   GLUCOSEU 250 (A) 10/10/2020 1017   HGBUR NEGATIVE 10/10/2020 [place] 10/10/2020 1017   KETONESUR NEGATIVE 10/10/2020 1017   UROBILINOGEN 0.2 10/10/2020 1017   NITRITE NEGATIVE 10/10/2020 1017   LEUKOCYTESUR NEGATIVE 10/10/2020 1017    Radiological Exams on Admission: I have personally reviewed images DG Chest 2 View  Result Date: 09/06/2021 CLINICAL DATA:  Mid chest pain. EXAM: CHEST - 2 VIEW COMPARISON:  November 11, 2017 FINDINGS: Very mild linear atelectasis is seen within the left lung base. There is no evidence of acute infiltrate, pleural effusion or pneumothorax. The heart size and mediastinal contours are within normal limits. The visualized skeletal structures are unremarkable. IMPRESSION: Very mild left basilar linear atelectasis. Electronically Signed   By: Virgina Norfolk M.D.   On: 09/06/2021 02:47    EKG: I have personally reviewed  EKG: NSR    Assessment/Plan Principal Problem:   Exertional chest pain Active Problems:   Insulin dependent type 2 diabetes mellitus, controlled ([place])   Hyperlipidemia   Hemochromatosis   Essential hypertension   NICM (nonischemic cardiomyopathy) (HCC)    Exertional chest pain Admit to observation telemetry bed. Pt already given asa. Serial troponins ordered. EDP to discuss with cardiology what ischemic testing strategy cardiology wants. Keep NPO. lovenox for DVT prophylaxis. Prn ntg. Check lipid panel. Pt states he cannot take statins due to myalgias.  Insulin dependent type 2 diabetes mellitus, controlled (HCC) Check A1c. On metformin, glipizide and actos at home.  Hyperlipidemia On fibrate due to statin intolerance.  Hemochromatosis Stable.  Essential hypertension Continue toprol-xl, norasc and arb.  NICM (nonischemic cardiomyopathy) ([place]) Per EMR last echo in 2011. Showed EF of 30%. Will update echo.  DVT prophylaxis: Lovenox Code Status: Full Code Family Communication: no family at bedside  Disposition Plan: return home  Consults called: EDP is going to contact cardiology  Admission status: Observation, Telemetry bed   Kristopher Oppenheim, DO [facility] 09/06/2021, 3:39 AM

## 2021-09-06 NOTE — ED Provider Notes (Addendum)
[facility] DEPT Provider Note  CSN: [id number] Arrival date & time: 09/05/21 2208  Chief Complaint(s) Chest Pain  HPI REMIEL CORTI is a 65 y.o. male with a past medical history listed below who presents to the emergency department with 3 to 4 days of gradually worsening substernal chest pressure radiating to mid back.  Pain is usually exertional but has had few episodes at rest.  Associated mild shortness of breath.  No nausea or vomiting.  No diaphoresis.  No recent fevers or chills.  No coughing or congestion.  No abdominal pain.  Patient initially thought this was related to acid reflux but and antacids have not been helping.   Chest Pain  Past Medical History Past Medical History:  Diagnosis Date   Anxiety    Arthritis    Cardiomyopathy    Echo (5/11) showed EF 30% with diffuse hypokinesis, normal wall thickness, mildly decreased RV systolic function. This may be a cardiomyopathy due to hemochromatosis versus tachy-mediated. Unable to get cardiac MRI due to claustrophobia.   Cervical spine pain    Chronic   Chest pain    Exercise/adenosine Myoview showed EF 50% (3/10). Inferior hypokinesis. Perfusion images, inferior scar and ischemia in the apical anterior septum and in teh inferior wall . Suggestive of a CD. LHC (3/10) showed only lu minal irregulatrities in the coronaries w/ EF 55%, suggesting that myoview was a false positive, likely from diaphragmatic attnuation.   Diabetes mellitus    Diverticulosis    Fatty liver    GERD (gastroesophageal reflux disease)    Heart murmur    Hereditary hemochromatosis    Compound heterozygote. Pt gets periodic phlebotomies   History of colonic polyps    Hyperlipidemia    Hypertension    Low back pain    The pt is S/P spinal fusion surgeries   Multifocal atrial tachycardia (HCC)    Wandering atrial pacemaker. Holter monior in 5/11 showed frequent runs of symtomatic MAT with heart rate around 100. Pt  is now  on amiodarone to try to suppress MAT. PFTs (6/11): FVC 105%, FEV1 109%, TLC 112%, DLCO 87% (minimal obstructive effect). LFTs stable on amiodarone   Peptic ulcer disease    Thyroid nodule    Patient Active Problem List   Diagnosis Date Noted   LLQ abdominal pain 06/04/2021   Statin myopathy 10/12/2020   Epistaxis 09/13/2020   Erectile dysfunction 05/20/2019   Increased prostate specific antigen (PSA) velocity 05/19/2019   Asbestos exposure 11/14/2017   Fatigue 11/14/2017   Vision loss, bilateral 05/06/2017   Special screening for malignant neoplasms, colon 04/29/2016   Degenerative disc disease, lumbar 03/14/2016   Skin lesion 10/30/2015   Atrial tachycardia ([place]) 09/05/2015   Arthralgia 11/01/2014   Subacromial bursitis 11/01/2014   Bilateral knee pain 10/26/2014   Left shoulder pain 10/26/2014   Bilateral hand pain 10/26/2013   Right lateral epicondylitis 10/26/2013   NICM (nonischemic cardiomyopathy) ([place]) 08/18/2013   Right thyroid nodule 05/12/2013   Hypothyroidism due to amiodarone 05/12/2012   Abnormal TSH 11/13/2011   Fatty infiltration of liver 08/01/2011   Iron storage disease 08/01/2011   Diverticulosis of colon (without mention of hemorrhage) 08/01/2011   Palpitations 07/02/2011   TRIGGER FINGER, RIGHT MIDDLE 11/12/2010   FLATULENCE ERUCTATION AND GAS PAIN 07/18/1593   Chronic systolic heart failure ([place]) 01/22/2010   TACHYCARDIA 01/22/2010   Cervicalgia 05/03/2009   Hemochromatosis 01/23/2009   OTHER SPECIFIED ARTHROPATHY MULTIPLE SITES 01/23/2009   HEMOCHROMATOSIS 01/23/2009  ABNORMAL STRESS ELECTROCARDIOGRAM 11/13/2008   CHEST PAIN 11/02/2008   TRANSAMINASES, SERUM, ELEVATED 11/02/2008   Dysfunction of eustachian tube 05/02/2008   OTITIS MEDIA, SEROUS 01/12/2008   Insulin dependent type 2 diabetes mellitus, controlled ([place]) 11/02/2007   Anxiety state 11/02/2007   PEPTIC ULCER DISEASE 11/02/2007   Chronic low back pain 11/02/2007    Hyperlipidemia 06/07/2007   Hypertension, uncontrolled 06/07/2007   GERD 06/07/2007   POLYP, GALLBLADDER 06/07/2007   COLONIC POLYPS, HX OF 06/07/2007   FATTY LIVER DISEASE, HX OF 06/07/2007   Home Medication(s) Prior to Admission medications   Medication Sig Start Date End Date Taking? Authorizing Provider  Alcohol Swabs (B-D SINGLE USE SWABS REGULAR) PADS Use as directed once daily E11.9 07/11/21   Biagio Borg, MD  ALPRAZolam Duanne Moron) 0.5 MG tablet 1/2 - 1 tab by mouth twice per day as needed 11/16/18   Biagio Borg, MD  amLODipine (NORVASC) 5 MG tablet Take 1 tablet (5 mg total) by mouth daily. 10/12/20 10/12/21  Biagio Borg, MD  aspirin 81 MG EC tablet Take 81 mg by mouth daily.    [provider]  Blood Glucose Calibration (TRUE METRIX LEVEL 1) Low SOLN Use as directed once daily E11.9 07/11/21   Biagio Borg, MD  Blood Glucose Monitoring Suppl (TRUE METRIX METER) w/Device KIT Use as directed once daily E11.9 07/11/21   Biagio Borg, MD  Cholecalciferol (VITAMIN D-3 PO) Take 1 tablet by mouth daily. PATIENT TAKES ONCE TABLET BY MOUTH ONCE DAILY . PATIENT NOT SURE OF DOSEAGE.    [provider]  diazepam (VALIUM) 5 MG tablet Take 1 tablet (5 mg total) by mouth daily as needed for muscle spasms or sedation. 05/12/18   Biagio Borg, MD  DROPLET PEN NEEDLES 32G X 4 MM MISC  11/19/20   [provider]  fenofibrate (TRICOR) 145 MG tablet TAKE 1 TABLET EVERY DAY 07/10/21   Biagio Borg, MD  glipiZIDE (GLUCOTROL XL) 10 MG 24 hr tablet TAKE 1 TABLET BY MOUTH DAILY WITH BREAKFAST. NEED APPOINTMENT. 01/18/21   Biagio Borg, MD  glucosamine-chondroitin 500-400 MG tablet Take 1 tablet by mouth 3 (three) times daily.    [provider]  glucose blood (RELION TRUE METRIX TEST STRIPS) test strip Use as instructed once daily E11.9 07/11/21   Biagio Borg, MD  ibuprofen (ADVIL,MOTRIN) 400 MG tablet Take 400 mg by mouth every 6 (six) hours as needed for headache or  moderate pain. Reported on 10/30/2015    [provider]  levothyroxine (SYNTHROID) 25 MCG tablet TAKE 1 TABLET EVERY DAY (NEED MD APPOINTMENT) 01/18/21   Biagio Borg, MD  metFORMIN (GLUCOPHAGE-XR) 500 MG 24 hr tablet TAKE 4 TABLETS EVERY DAY WITH BREAKFAST 01/18/21   Biagio Borg, MD  metoprolol succinate (TOPROL-XL) 100 MG 24 hr tablet TAKE 1 TABLET BY MOUTH TWICE DAILY. NEED APPOINTMENT. 01/18/21   Biagio Borg, MD  olmesartan (BENICAR) 40 MG tablet Take 1 tablet (40 mg total) by mouth daily. 10/12/20   Biagio Borg, MD  Omega-3 Fatty Acids (FISH OIL PO) Take 1 tablet by mouth daily.     [provider]  pantoprazole (PROTONIX) 40 MG tablet TAKE 1 TABLET BY MOUTH DAILY. ANNUAL APPOINTMENT DUE IN [facility]. MUST SEE PROVIDER FOR FUTURE REFILLS. 01/18/21   Biagio Borg, MD  pioglitazone (ACTOS) 45 MG tablet TAKE 1 TABLET BY MOUTH DAILY. ANNUAL APPT DUE IN MARCH, MUST SEE PROVIDER FOR FUTURE REFILLS.  01/18/21   Biagio Borg, MD  tadalafil (CIALIS) 20 MG tablet Take 1 tablet (20 mg total) by mouth daily as needed for erectile dysfunction. 04/17/21 05/17/21  Biagio Borg, MD  TRUEplus Lancets 33G MISC Use as directed once daily E11.9 07/11/21   Biagio Borg, MD                                                                                                                                    Past Surgical History Past Surgical History:  Procedure Laterality Date   INGUINAL HERNIA REPAIR Right    LUMBAR [place] SURGERY  09/15/1989   TONSILLECTOMY     Family History Family History  Problem Relation Age of Onset   Colon polyps Mother    Multiple sclerosis Mother    Diabetes Mother    Colitis Mother    Hypertension Father    Alcohol abuse Father    Multiple sclerosis Sister    Diabetes Paternal Grandmother    Diabetes Paternal Grandfather    Colon cancer Neg Hx     Social History Social History   Tobacco Use   Smoking status: Former    Types: Cigarettes    Quit date: 09/16/2003     Years since quitting: 17.9   Smokeless tobacco: Never  Vaping Use   Vaping Use: Never used  Substance Use Topics   Alcohol use: Yes    Alcohol/week: 1.0 standard drink    Types: 1 Standard drinks or equivalent per week    Comment: social   Drug use: Yes    Comment: Quit in 1980   Allergies Dicyclomine, Lipitor [atorvastatin], Penicillins, Reglan [metoclopramide], Zocor [simvastatin], and Zoloft [sertraline hcl]  Review of Systems Review of Systems  Cardiovascular:  Positive for chest pain.  All other systems are reviewed and are negative for acute change except as noted in the HPI  Physical Exam Vital Signs  I have reviewed the triage vital signs BP (!) 160/88 (BP Location: Left Arm)    Pulse 88    Temp 99 F (37.2 C) (Oral)    Resp 17    Ht '5\' 11"'  (1.803 m)    Wt 104.3 kg    SpO2 94%    BMI 32.08 kg/m   Physical Exam Vitals reviewed.  Constitutional:      General: He is not in acute distress.    Appearance: He is well-developed. He is not diaphoretic.  HENT:     Head: Normocephalic and atraumatic.     Nose: Nose normal.  Eyes:     General: No scleral icterus.       Right eye: No discharge.        Left eye: No discharge.     Conjunctiva/sclera: Conjunctivae normal.     Pupils: Pupils are equal, round, and reactive to light.  Cardiovascular:     Rate and Rhythm: Normal rate and regular rhythm.  Heart sounds: No murmur heard.   No friction rub. No gallop.  Pulmonary:     Effort: Pulmonary effort is normal. No respiratory distress.     Breath sounds: Normal breath sounds. No stridor. No rales.  Abdominal:     General: There is no distension.     Palpations: Abdomen is soft.     Tenderness: There is no abdominal tenderness.  Musculoskeletal:        General: No tenderness.     Cervical back: Normal range of motion and neck supple.  Skin:    General: Skin is warm and dry.     Findings: No erythema or rash.  Neurological:     Mental Status: He is alert and  oriented to person, place, and time.    ED Results and Treatments Labs (all labs ordered are listed, but only abnormal results are displayed) Labs Reviewed  COMPREHENSIVE METABOLIC PANEL - Abnormal; Notable for the following components:      Result Value   Glucose, Bld 132 (*)    All other components within normal limits  RESP PANEL BY RT-PCR (FLU A&B, COVID) ARPGX2  CBC WITH DIFFERENTIAL/PLATELET  LIPASE, BLOOD  TROPONIN I (HIGH SENSITIVITY)  TROPONIN I (HIGH SENSITIVITY)                                                                                                                         EKG  EKG Interpretation  Date/Time:  Thursday September 05 2021 22:15:00 EST Ventricular Rate:  85 PR Interval:  155 QRS Duration: 109 QT Interval:  402 QTC Calculation: 478 R Axis:   -20 Text Interpretation: Sinus rhythm Atrial premature complex Borderline left axis deviation Borderline prolonged QT interval Otherwise no significant change Confirmed by Addison Lank [phone]) on 09/06/2021 1:59:09 AM       Radiology DG Chest 2 View  Result Date: 09/06/2021 CLINICAL DATA:  Mid chest pain. EXAM: CHEST - 2 VIEW COMPARISON:  November 11, 2017 FINDINGS: Very mild linear atelectasis is seen within the left lung base. There is no evidence of acute infiltrate, pleural effusion or pneumothorax. The heart size and mediastinal contours are within normal limits. The visualized skeletal structures are unremarkable. IMPRESSION: Very mild left basilar linear atelectasis. Electronically Signed   By: Virgina Norfolk M.D.   On: 09/06/2021 02:47    Pertinent labs & imaging results that were available during my care of the patient were reviewed by me and considered in my medical decision making (see MDM for details).  Medications Ordered in ED Medications  aspirin chewable tablet 324 mg (324 mg Oral Given 09/06/21 0253)  Procedures Procedures  (including critical care time)  Medical Decision Making / ED Course I have reviewed the nursing notes for this encounter and the patient's prior records (if available in EHR or on provided paperwork).  Keedan Sample Rettig was evaluated in Emergency Department on 09/06/2021 for the symptoms described in the history of present illness. He was evaluated in the context of the global COVID-19 pandemic, which necessitated consideration that the patient might be at risk for infection with the SARS-CoV-2 virus that causes COVID-19. Institutional protocols and algorithms that pertain to the evaluation of patients at risk for COVID-19 are in a state of rapid change based on information released by regulatory bodies including the CDC and federal and state organizations. These policies and algorithms were followed during the patient's care in the ED.     Exertional chest pain with a few episodes at rest. Most concerning for angina.  Possible unstable. EKG without acute ischemic changes or evidence of pericarditis. Initial troponin negative.  On my read of the chest x-ray, there was no evidence suggestive of pneumonia, pneumothorax, pneumomediastinum, pulmonary edema concerning for new or exacerbation of heart failure, abnormal contour of the mediastinum to suggest dissection, and no evidence of acute injuries.  Presentation not classic for dissection or esophageal perforation.  Low suspicion for pulmonary embolism.  Pertinent labs & imaging results that were available during my care of the patient were reviewed by me and considered in my medical decision making:  Patient has a heart score greater than 4.  Will need admission for ACS rule out.  Will consult cardiology to determine which diagnostic testing patient would need if he rules out. Dr. Doroteo Glassman recommended CT coronaries. Admit to medicine.  Final Clinical Impression(s) / ED  Diagnoses Final diagnoses:  Chest pain     This chart was dictated using voice recognition software.  Despite best efforts to proofread,  errors can occur which can change the documentation meaning.      Fatima Blank, MD 09/06/21 [phone]

## 2021-09-06 NOTE — CV Procedure (Signed)
50 to 60% mid LAD stenosis 99% mid RCA stenosis above the bifurcation to a large RV branch and the continuation of the RCA that supplies the inferobasal segment. Circumflex and left main widely patent LV function is normal. 20 x 3.0 Synergy postdilated to 3.75 mm in diameter proximally.  Unable to advance a balloon across the stent struts into the acute marginal branch which eventually opened up after IC NTG and wire reperfusion.  Balloon was not performed.  EKG became nonischemic and the case was terminated. Plan aspirin and Plavix x12 months.  Aggressive risk factor modification.  Discharge in a.m.

## 2021-09-06 NOTE — ED Notes (Signed)
Cardiology at bedside discussing cath with patient.

## 2021-09-06 NOTE — Subjective & Objective (Signed)
CC: chest pain HPI: 65 year old white male with a history of type 2 diabetes, hypertension, hyperlipidemia, hemochromatosis, prior history of nonischemic cardiomyopathy in 2011 reported EF of 30% presents to the ER today with exertional chest pain for the last 5 days.  Patient states that he walks around 50 yards before he starts getting centralized chest pain.  No radiation of pain.  No nausea.  No vomiting.  Took about 5 minutes for his chest pain to go away.  His pain is only occasionally reproducible.  Patient states that he was at [facility] home improvement with his brother yesterday and walk around the store for about 30 minutes without any chest pain.  He went to [facility] later on that day and was barely able to walk from his car to the front door and had chest pain.  He states that been going on for the last for 5 days.  Started making concerned as this was happening more frequently.  Denies any other recent symptoms including nausea, vomiting, diarrhea.  First set of troponins were negative.  EKG without any acute ST changes.  EDP stated that he was going to talk with cardiology about ischemic testing strategy.  [facility] hospitalist contacted for admission.

## 2021-09-06 NOTE — Assessment & Plan Note (Signed)
Check A1c. On metformin, glipizide and actos at home.

## 2021-09-06 NOTE — Progress Notes (Signed)
PROGRESS NOTE    Keith [name]  IWL:[id number] DOB: [birth year] DOA: 09/05/2021 PCP: Biagio Borg, MD    Brief Narrative:  Keith [name] was admitted to the hospital with the working diagnosis of unstable angina.   65 yo male with the past medical history of T2DM, HTN, dyslipidemia, hemochromatosis and heart failure who presented with chest pain. He has chronic angina, but had worsening symptoms over the last 5 days but not severe over last 24 hours leading into his hospitalization. Worsening exertional chest pain. Burning in nature, associated with mild dyspnea and severe in intensity. Across his chest with no radiation. On his initial physical examination his blood pressure was 173/91, HR 84, RR 18, and oxygen saturation 95%, his lungs were clear to auscultation, heart with S1 and S2 present and rhythmic, abdomen soft and non tender, and no lower extremity edema.   Sodium 138, potassium 3.8, chloride 108, bicarb 23, glucose 132, BUN 19, creatinine 1.19, high sensitive troponin 4, white count 10.4, hemoglobin 14.5, hematocrit 43.5, platelets 268. SARS COVID-19 negative.  EKG with 85 bpm, normal axis and normal intervals, sinus rhythm with premature atrial complexes, no significant ST segment or T wave changes.   Chest radiograph with bibasilar atelectasis with no infiltrates.  Assessment & Plan:   Principal Problem:   Exertional chest pain Active Problems:   Insulin dependent type 2 diabetes mellitus, controlled (HCC)   Hyperlipidemia   Hemochromatosis   Essential hypertension   NICM (nonischemic cardiomyopathy) ([place])   Unstable angina. Patient with no current chest pain at rest.   Continue medical therapy with aspirin and rosuvastatin.  Blood pressure control with amlodipine and irbesartan. Further work up with cardiac catheterization and coronary angiography.  Patient will be transferred to [facility]  Follow up with Cardiology recommendations.   2. HTN. Continue blood  pressure control with amlodipine, and irbesartan.   3. Dyslipidemia, Obesity class 1. Continue with rosuvastatin and fenofibrate. Calculated BMI is 32,0.   4. Hypothyroid. Continue with levothyroxine.  5. Hemochromatosis. Follow up as outpatient.    Patient continue to be at high risk for worsening chest pain   Status is: Observation  The patient remains OBS appropriate and will d/c before 2 midnights.  \ DVT prophylaxis: Enoxaparin   Code Status:    full  Family Communication:   No family at the bedside    Consultants:  Cardiology    Subjective: Patient currently with no chest pain, no nausea or vomiting, no dyspnea.   Objective: Vitals:   09/06/21 0700 09/06/21 0730 09/06/21 0800 09/06/21 0900  BP: 126/82 (!) 153/95 130/70 (!) 153/98  Pulse: 81 85 87 92  Resp: (!) 23 12 19 14   Temp:      TempSrc:      SpO2: 90% 95% 92% 94%  Weight:      Height:        Intake/Output Summary (Last 24 hours) at 09/06/2021 1025 Last data filed at 09/06/2021 1740 Gross per 24 hour  Intake --  Output 300 ml  Net -300 ml   Filed Weights   09/05/21 2214  Weight: 104.3 kg    Examination:   General: Not in pain or dyspnea  Neurology: Awake and alert, non focal  E ENT: no pallor, no icterus, oral mucosa moist Cardiovascular: No JVD. S1-S2 present, rhythmic, no gallops, rubs, or murmurs. No lower extremity edema. Pulmonary: positive breath sounds bilaterally, adequate air movement, no wheezing, rhonchi or rales. Gastrointestinal. Abdomen soft and non tender Skin. No  rashes Musculoskeletal: no joint deformities     Data Reviewed: I have personally reviewed following labs and imaging studies  CBC: Recent Labs  Lab 09/05/21 2220  WBC 10.4  NEUTROABS 6.4  HGB 14.5  HCT 43.5  MCV 92.2  PLT 119   Basic Metabolic Panel: Recent Labs  Lab 09/05/21 2220  NA 138  K 3.8  CL 108  CO2 23  GLUCOSE 132*  BUN 19  CREATININE 1.19  CALCIUM 9.3   GFR: Estimated  Creatinine Clearance: 77.1 mL/min (by C-G formula based on SCr of 1.19 mg/dL). Liver Function Tests: Recent Labs  Lab 09/05/21 2220  AST 24  ALT 26  ALKPHOS 43  BILITOT 0.7  PROT 7.8  ALBUMIN 4.5   Recent Labs  Lab 09/05/21 2220  LIPASE 39   No results for input(s): AMMONIA in the last 168 hours. Coagulation Profile: No results for input(s): INR, PROTIME in the last 168 hours. Cardiac Enzymes: No results for input(s): CKTOTAL, CKMB, CKMBINDEX, TROPONINI in the last 168 hours. BNP (last 3 results) No results for input(s): PROBNP in the last 8760 hours. HbA1C: No results for input(s): HGBA1C in the last 72 hours. CBG: No results for input(s): GLUCAP in the last 168 hours. Lipid Profile: No results for input(s): CHOL, HDL, LDLCALC, TRIG, CHOLHDL, LDLDIRECT in the last 72 hours. Thyroid Function Tests: No results for input(s): TSH, T4TOTAL, FREET4, T3FREE, THYROIDAB in the last 72 hours. Anemia Panel: No results for input(s): VITAMINB12, FOLATE, FERRITIN, TIBC, IRON, RETICCTPCT in the last 72 hours.    Radiology Studies: I have reviewed all of the imaging during this hospital visit personally     Scheduled Meds:  amLODipine  5 mg Oral Daily   aspirin EC  81 mg Oral Daily   enoxaparin (LOVENOX) injection  40 mg Subcutaneous Q24H   fenofibrate  160 mg Oral Daily   irbesartan  300 mg Oral Daily   levothyroxine  25 mcg Oral Q0600   pantoprazole  40 mg Oral Daily   Continuous Infusions:   LOS: 0 days        Rozell Theiler Gerome Apley, MD

## 2021-09-06 NOTE — Interval H&P Note (Signed)
Cath Lab Visit (complete for each Cath Lab visit)  Clinical Evaluation Leading to the Procedure:   ACS: Yes.    Non-ACS:    Anginal Classification: CCS III  Anti-ischemic medical therapy: Minimal Therapy (1 class of medications)  Non-Invasive Test Results: No non-invasive testing performed  Prior CABG: No previous CABG      History and Physical Interval Note:  09/06/2021 11:56 AM  Keith [name]  has presented today for surgery, with the diagnosis of unstable angina.  The various methods of treatment have been discussed with the patient and family. After consideration of risks, benefits and other options for treatment, the patient has consented to  Procedure(s): LEFT HEART CATH AND CORONARY ANGIOGRAPHY ([name]) as a surgical intervention.  The patient's history has been reviewed, patient examined, no change in status, stable for surgery.  I have reviewed the patient's chart and labs.  Questions were answered to the patient's satisfaction.     Belva Crome III

## 2021-09-06 NOTE — Assessment & Plan Note (Signed)
Admit to observation telemetry bed. Pt already given asa. Serial troponins ordered. EDP to discuss with cardiology what ischemic testing strategy cardiology wants. Keep NPO. lovenox for DVT prophylaxis. Prn ntg. Check lipid panel. Pt states he cannot take statins due to myalgias.

## 2021-09-06 NOTE — Assessment & Plan Note (Signed)
On fibrate due to statin intolerance.

## 2021-09-06 NOTE — Assessment & Plan Note (Signed)
Per EMR last echo in 2011. Showed EF of 30%. Will update echo.

## 2021-09-06 NOTE — ED Notes (Signed)
[facility] at bedside to transport patient to cath lab.

## 2021-09-07 DIAGNOSIS — I200 Unstable angina: Secondary | ICD-10-CM

## 2021-09-07 LAB — CBC
HCT: 37.8 % — ABNORMAL LOW (ref 39.0–52.0)
Hemoglobin: 13 g/dL (ref 13.0–17.0)
MCH: 31.3 pg (ref 26.0–34.0)
MCHC: 34.4 g/dL (ref 30.0–36.0)
MCV: 90.9 fL (ref 80.0–100.0)
Platelets: 197 10*3/uL (ref 150–400)
RBC: 4.16 MIL/uL — ABNORMAL LOW (ref 4.22–5.81)
RDW: 13.8 % (ref 11.5–15.5)
WBC: 8.9 10*3/uL (ref 4.0–10.5)
nRBC: 0 % (ref 0.0–0.2)

## 2021-09-07 LAB — GLUCOSE, CAPILLARY
Glucose-Capillary: 139 mg/dL — ABNORMAL HIGH (ref 70–99)
Glucose-Capillary: 180 mg/dL — ABNORMAL HIGH (ref 70–99)

## 2021-09-07 LAB — BASIC METABOLIC PANEL
Anion gap: 7 (ref 5–15)
BUN: 11 mg/dL (ref 8–23)
CO2: 22 mmol/L (ref 22–32)
Calcium: 8.5 mg/dL — ABNORMAL LOW (ref 8.9–10.3)
Chloride: 108 mmol/L (ref 98–111)
Creatinine, Ser: 1.11 mg/dL (ref 0.61–1.24)
GFR, Estimated: >60 mL/min (ref >60)
Glucose, Bld: 165 mg/dL — ABNORMAL HIGH (ref 70–99)
Potassium: 3.6 mmol/L (ref 3.5–5.1)
Sodium: 137 mmol/L (ref 135–145)

## 2021-09-07 LAB — HIV ANTIBODY (ROUTINE TESTING W REFLEX): HIV Screen 4th Generation wRfx: NONREACTIVE

## 2021-09-07 MED ORDER — ROSUVASTATIN CALCIUM 5 MG PO TABS: 5.0000 mg | ORAL_TABLET | Freq: Every day | ORAL | 0 refills | Status: DC

## 2021-09-07 MED ORDER — CLOPIDOGREL BISULFATE 75 MG PO TABS: 75.0000 mg | ORAL_TABLET | Freq: Every day | ORAL | 0 refills | Status: DC

## 2021-09-07 NOTE — Progress Notes (Signed)
Discharge instructions, RX's and follow up appts explain and provided to patient verbalized understanding. Patient left floor via wheelchair accompanied by staff no c/o pain of sob or pain at d/c.  Shahla Betsill, Tivis Ringer, RN

## 2021-09-07 NOTE — Plan of Care (Signed)
°  Problem: Education: Goal: Understanding of CV disease, CV risk reduction, and recovery process will improve 09/07/2021 1105 by Emmaline Life, RN Outcome: Progressing 09/07/2021 1058 by Emmaline Life, RN Outcome: Progressing

## 2021-09-07 NOTE — Progress Notes (Signed)
Progress Note  Patient Name: Keith [name] Date of Encounter: 09/07/2021  [facility] Cardiologist: None   Subjective   Currently feeling well.  No chest pain or shortness of breath.  Ready to return home.  Inpatient Medications    Scheduled Meds:  amLODipine  5 mg Oral Daily   aspirin EC  81 mg Oral Daily   clopidogrel  75 mg Oral Q breakfast   enoxaparin (LOVENOX) injection  40 mg Subcutaneous Q24H   fenofibrate  160 mg Oral Daily   insulin aspart  0-15 Units Subcutaneous TID WC   irbesartan  300 mg Oral Daily   levothyroxine  25 mcg Oral Q0600   metoprolol succinate  50 mg Oral BID   pantoprazole  40 mg Oral Daily   rosuvastatin  5 mg Oral Daily   sodium chloride flush  3 mL Intravenous Q12H   sodium chloride flush  3 mL Intravenous Q12H   Continuous Infusions:  sodium chloride     sodium chloride     PRN Meds: sodium chloride, sodium chloride, acetaminophen, ondansetron (ZOFRAN) IV, oxyCODONE, sodium chloride flush, sodium chloride flush   Vital Signs    Vitals:   09/06/21 2300 09/07/21 0415 09/07/21 0811 09/07/21 0812  BP:  133/85  116/84  Pulse:  84    Resp:  19 18   Temp:  98.6 F (37 C) 98.3 F (36.8 C)   TempSrc:  Oral Oral   SpO2: 94% 94% 96% 95%  Weight:      Height:        Intake/Output Summary (Last 24 hours) at 09/07/2021 1028 Last data filed at 09/06/2021 1742 Gross per 24 hour  Intake 1326.3 ml  Output 300 ml  Net 1026.3 ml   Last 3 Weights 09/06/2021 09/05/2021 08/01/2021  Weight (lbs) 230 lb 14.4 oz 230 lb 242 lb  Weight (kg) 104.736 kg 104.327 kg 109.77 kg      Telemetry    Sinus rhythm- Personally Reviewed  ECG    Sinus rhythm- Personally Reviewed  Physical Exam   GEN: No acute distress.   Neck: No JVD Cardiac: RRR, no murmurs, rubs, or gallops.  Respiratory: Clear to auscultation bilaterally. GI: Soft, nontender, non-distended  MS: No edema; No deformity. Neuro:  Nonfocal  Psych: Normal affect   Labs     High Sensitivity Troponin:   Recent Labs  Lab 09/05/21 2220 09/06/21 0248  TROPONINIHS 4 4     Chemistry Recent Labs  Lab 09/05/21 2220 09/06/21 1454 09/07/21 0559  NA 138  --  137  K 3.8  --  3.6  CL 108  --  108  CO2 23  --  22  GLUCOSE 132*  --  165*  BUN 19  --  11  CREATININE 1.19 1.16 1.11  CALCIUM 9.3  --  8.5*  PROT 7.8  --   --   ALBUMIN 4.5  --   --   AST 24  --   --   ALT 26  --   --   ALKPHOS 43  --   --   BILITOT 0.7  --   --   GFRNONAA >60 >60 >60  ANIONGAP 7  --  7    Lipids  Recent Labs  Lab 09/06/21 1454  CHOL 167  TRIG 39  HDL 32*  LDLCALC 127*  CHOLHDL 5.2    Hematology Recent Labs  Lab 09/05/21 2220 09/06/21 1454 09/07/21 0559  WBC 10.4 9.3 8.9  RBC  4.72 4.27 4.16*  HGB 14.5 13.1 13.0  HCT 43.5 39.5 37.8*  MCV 92.2 92.5 90.9  MCH 30.7 30.7 31.3  MCHC 33.3 33.2 34.4  RDW 13.9 13.6 13.8  PLT 268 237 197   Thyroid No results for input(s): TSH, FREET4 in the last 168 hours.  BNPNo results for input(s): BNP, PROBNP in the last 168 hours.  DDimer No results for input(s): DDIMER in the last 168 hours.   Radiology    DG Chest 2 View  Result Date: 09/06/2021 CLINICAL DATA:  Mid chest pain. EXAM: CHEST - 2 VIEW COMPARISON:  November 11, 2017 FINDINGS: Very mild linear atelectasis is seen within the left lung base. There is no evidence of acute infiltrate, pleural effusion or pneumothorax. The heart size and mediastinal contours are within normal limits. The visualized skeletal structures are unremarkable. IMPRESSION: Very mild left basilar linear atelectasis. Electronically Signed   By: Virgina Norfolk M.D.   On: 09/06/2021 02:47   CARDIAC CATHETERIZATION  Result Date: 09/06/2021   Mid RCA lesion is 99% stenosed.   Prox LAD to Mid LAD lesion is 60% stenosed.   1st Diag lesion is 70% stenosed.   Acute Mrg lesion is 65% stenosed.   A stent was successfully placed.   Post intervention, there is a 0% residual stenosis.   The left  ventricular systolic function is normal.   LV end diastolic pressure is normal.   The left ventricular ejection fraction is 55-65% by visual estimate. CONCLUSIONS: Unstable angina related to plaque rupture in the mid to distal RCA producing a Medina 100 bifurcation stenosis with a large acute marginal branch. Successful provisional stenting of the RCA and reduction in 99% stenosis to less than 10% with TIMI grade III flow.  There was transient total occlusion of the acute marginal branch that returned after intracoronary nitroglycerin and rewiring the vessel with a guidewire. Widely patent left main 50 to 60% mid LAD with 60 to 70% diagonal with Medina 111 pattern. Circumflex is widely patent. LV is hyperdynamic with EF 60%.  LVEDP is less than 10 mmHg. RECOMMENDATIONS: Aspirin and Plavix for 12 months.  Anticipate discharge in a.m. unless complications. Aggressive risk factor modification. Phase 1 cardiac rehab.  ECHOCARDIOGRAM COMPLETE  Result Date: 09/06/2021    ECHOCARDIOGRAM REPORT   Patient Name:   Keith [name] Date of Exam: 09/06/2021 Medical Rec #:  [id number]         Height:       71.0 in Accession #:    [id number]        Weight:       230.0 lb Date of Birth:  Jan 07, 1956         BSA:          2.238 m Patient Age:    65 years          BP:           137/98 mmHg Patient Gender: M                 HR:           95 bpm. Exam Location:  Inpatient Procedure: 2D Echo, 3D Echo, Cardiac Doppler, Color Doppler and Strain Analysis Indications:    Nonischemic Cardiomyopathy I42.8  History:        Patient has prior history of Echocardiogram examinations, most                 recent 09/05/2014. Heart failure with improved ejection  fraction, nonobstructive coronary artery disease, multifocal                 atrial tachycardia, wandering atrial pacemaker, GERD,                 hypertension, hyperlipidemia, diabetes, and hypothyroidism,.  Sonographer:    Darlina Sicilian RDCS Referring Phys: [place]  1. Left ventricular ejection fraction, by estimation, is 55 to 60%. The left ventricle has normal function. The left ventricle has no regional wall motion abnormalities. Left ventricular diastolic parameters are consistent with Grade I diastolic dysfunction (impaired relaxation). The average left ventricular global longitudinal strain is -20.4 %. The global longitudinal strain is normal.  2. Right ventricular systolic function is normal. The right ventricular size is normal.  3. The mitral valve is normal in structure. No evidence of mitral valve regurgitation. No evidence of mitral stenosis.  4. The aortic valve is normal in structure. Aortic valve regurgitation is not visualized. No aortic stenosis is present.  5. There is borderline dilatation of the ascending aorta, measuring 37 mm.  6. The inferior vena cava is normal in size with greater than 50% respiratory variability, suggesting right atrial pressure of 3 mmHg. Comparison(s): Prior images unable to be directly viewed, comparison made by report only. FINDINGS  Left Ventricle: Left ventricular ejection fraction, by estimation, is 55 to 60%. The left ventricle has normal function. The left ventricle has no regional wall motion abnormalities. The average left ventricular global longitudinal strain is -20.4 %. The global longitudinal strain is normal. 3D left ventricular ejection fraction analysis performed but not reported based on interpreter judgement due to suboptimal tracking. The left ventricular internal cavity size was normal in size. There is no left ventricular hypertrophy. Left ventricular diastolic parameters are consistent with Grade I diastolic dysfunction (impaired relaxation). Normal left ventricular filling pressure. Right Ventricle: The right ventricular size is normal. No increase in right ventricular wall thickness. Right ventricular systolic function is normal. Left Atrium: Left atrial size was normal in size. Right Atrium:  Right atrial size was normal in size. Pericardium: There is no evidence of pericardial effusion. Mitral Valve: The mitral valve is normal in structure. No evidence of mitral valve regurgitation. No evidence of mitral valve stenosis. Tricuspid Valve: The tricuspid valve is normal in structure. Tricuspid valve regurgitation is not demonstrated. No evidence of tricuspid stenosis. Aortic Valve: The aortic valve is normal in structure. Aortic valve regurgitation is not visualized. No aortic stenosis is present. Pulmonic Valve: The pulmonic valve was normal in structure. Pulmonic valve regurgitation is not visualized. No evidence of pulmonic stenosis. Aorta: The aortic root is normal in size and structure. There is borderline dilatation of the ascending aorta, measuring 37 mm. Venous: The inferior vena cava is normal in size with greater than 50% respiratory variability, suggesting right atrial pressure of 3 mmHg. IAS/Shunts: No atrial level shunt detected by color flow Doppler.  LEFT VENTRICLE PLAX 2D LVIDd:         5.30 cm   Diastology LVIDs:         3.10 cm   LV e' medial:    5.33 cm/s LV PW:         1.00 cm   LV E/e' medial:  8.9 LV IVS:        1.10 cm   LV e' lateral:   6.09 cm/s LVOT diam:     2.30 cm   LV E/e' lateral: 7.8 LV SV:  69 LV SV Index:   26        2D Longitudinal Strain LVOT Area:     4.15 cm  2D Strain GLS Avg:     -20.4 %                           3D Volume EF:                          3D EF:        52 %                          LV EDV:       141 ml                          LV ESV:       68 ml                          LV SV:        73 ml RIGHT VENTRICLE RV S prime:     14.30 cm/s TAPSE (M-mode): 2.1 cm LEFT ATRIUM             Index        RIGHT ATRIUM           Index LA diam:        3.20 cm 1.43 cm/m   RA Area:     10.50 cm LA Vol (A2C):   35.8 ml 16.00 ml/m  RA Volume:   19.10 ml  8.54 ml/m LA Vol (A4C):   34.5 ml 15.42 ml/m LA Biplane Vol: 38.2 ml 17.07 ml/m  AORTIC VALVE LVOT Vmax:    82.10 cm/s LVOT Vmean:  59.000 cm/s LVOT VTI:    0.138 m  AORTA Ao Root diam: 3.90 cm Ao Asc diam:  3.70 cm MITRAL VALVE MV Area (PHT): 5.62 cm    SHUNTS MV Decel Time: 135 msec    Systemic VTI:  0.14 m MV E velocity: 47.30 cm/s  Systemic Diam: 2.30 cm MV A velocity: 60.70 cm/s MV E/A ratio:  0.78 Mihai Croitoru MD Electronically signed by Sanda Klein MD Signature Date/Time: 09/06/2021/1:43:23 PM    Final     Cardiac Studies   LHC  Unstable angina related to plaque rupture in the mid to distal RCA producing a Medina 100 bifurcation stenosis with a large acute marginal branch. Successful provisional stenting of the RCA and reduction in 99% stenosis to less than 10% with TIMI grade III flow.  There was transient total occlusion of the acute marginal branch that returned after intracoronary nitroglycerin and rewiring the vessel with a guidewire. Widely patent left main 50 to 60% mid LAD with 60 to 70% diagonal with Medina 111 pattern. Circumflex is widely patent. LV is hyperdynamic with EF 60%.  LVEDP is less than 10 mmHg.  TTE  1. Left ventricular ejection fraction, by estimation, is 55 to 60%. The  left ventricle has normal function. The left ventricle has no regional  wall motion abnormalities. Left ventricular diastolic parameters are  consistent with Grade I diastolic  dysfunction (impaired relaxation). The average left ventricular global  longitudinal strain is -20.4 %. The global longitudinal strain is normal.   2. Right ventricular systolic function is normal. The right ventricular  size  is normal.   3. The mitral valve is normal in structure. No evidence of mitral valve  regurgitation. No evidence of mitral stenosis.   4. The aortic valve is normal in structure. Aortic valve regurgitation is  not visualized. No aortic stenosis is present.   5. There is borderline dilatation of the ascending aorta, measuring 37  mm.   6. The inferior vena cava is normal in size with greater than  50%  respiratory variability, suggesting right atrial pressure of 3 mmHg.  Patient Profile     65 y.o. male presented to the hospital with unstable angina, found to have RCA disease status post RCA stent.  Assessment & Plan    1.  Unstable angina: Status post RCA stent.  Greig Altergott need dual antiplatelet therapy with aspirin and Plavix for 1 year.  Athanasius Kesling need cardiac rehab at discharge.  2.  Hypertension: Currently well controlled  3.  Hyperlipidemia: Has statin intolerance.  Trying low-dose rosuvastatin.  May need follow-up in lipid clinic.  4.  Type 2 diabetes: Continue management per primary team.  5.  History of nonischemic cardiomyopathy: Ejection fraction 30% in 2011.  Fortunately ejection fraction has since normalized.  Continue beta-blocker home beta-blocker and ARB.  6.  Multifocal atrial tachycardia: Noted in 2011.  Continue beta-blocker.   [facility] Chennel Olivos sign off.   Medication Recommendations: Continue current hospital ordered medications Other recommendations (labs, testing, etc): None Follow up as an outpatient: To be arranged in cardiology clinic  For questions or updates, please contact [place] Please consult [email or web address] for contact info under        Signed, Suhaylah Wampole Meredith Leeds, MD  09/07/2021, 10:28 AM

## 2021-09-07 NOTE — Plan of Care (Signed)
°  Problem: Education: Goal: Understanding of CV disease, CV risk reduction, and recovery process will improve Outcome: Progressing   

## 2021-09-07 NOTE — Plan of Care (Signed)
°  Problem: Education: Goal: Understanding of CV disease, CV risk reduction, and recovery process will improve 09/07/2021 1232 by Emmaline Life, RN Outcome: Adequate for Discharge 09/07/2021 1105 by Emmaline Life, RN Outcome: Progressing 09/07/2021 1058 by Emmaline Life, RN Outcome: Progressing Goal: Individualized Educational Video(s) Outcome: Adequate for Discharge   Problem: Activity: Goal: Ability to return to baseline activity level will improve Outcome: Adequate for Discharge   Problem: Cardiovascular: Goal: Ability to achieve and maintain adequate cardiovascular perfusion will improve Outcome: Adequate for Discharge Goal: Vascular access site(s) Level 0-1 will be maintained Outcome: Adequate for Discharge   Problem: Health Behavior/Discharge Planning: Goal: Ability to safely manage health-related needs after discharge will improve Outcome: Adequate for Discharge   Problem: Education: Goal: Knowledge of General Education information will improve Description: Including pain rating scale, medication(s)/side effects and non-pharmacologic comfort measures Outcome: Adequate for Discharge   Problem: Health Behavior/Discharge Planning: Goal: Ability to manage health-related needs will improve Outcome: Adequate for Discharge   Problem: Clinical Measurements: Goal: Ability to maintain clinical measurements within normal limits will improve Outcome: Adequate for Discharge Goal: Will remain free from infection Outcome: Adequate for Discharge Goal: Diagnostic test results will improve Outcome: Adequate for Discharge Goal: Respiratory complications will improve Outcome: Adequate for Discharge Goal: Cardiovascular complication will be avoided Outcome: Adequate for Discharge   Problem: Activity: Goal: Risk for activity intolerance will decrease Outcome: Adequate for Discharge   Problem: Nutrition: Goal: Adequate nutrition will be maintained Outcome: Adequate for  Discharge   Problem: Coping: Goal: Level of anxiety will decrease Outcome: Adequate for Discharge   Problem: Elimination: Goal: Will not experience complications related to bowel motility Outcome: Adequate for Discharge Goal: Will not experience complications related to urinary retention Outcome: Adequate for Discharge   Problem: Pain Managment: Goal: General experience of comfort will improve Outcome: Adequate for Discharge   Problem: Safety: Goal: Ability to remain free from injury will improve Outcome: Adequate for Discharge   Problem: Skin Integrity: Goal: Risk for impaired skin integrity will decrease Outcome: Adequate for Discharge

## 2021-09-07 NOTE — Discharge Instructions (Signed)
No lifting over 5 lbs for 1 week. No sexual activity for 1 week. Keep procedure site clean & dry. If you notice increased pain, swelling, bleeding or pus, call/return!  You may shower, but no soaking baths/hot tubs/pools for 1 week °

## 2021-09-07 NOTE — Discharge Summary (Signed)
Physician Discharge Summary  Keith [name] TDV:[id number] DOB: 1955-10-11 DOA: 09/05/2021  PCP: Biagio Borg, MD  Admit date: 09/05/2021 Discharge date: 09/07/2021  Admitted From: Home Disposition: Home  Recommendations for Outpatient Follow-up:  Follow up with PCP in 1-2 weeks Please obtain BMP/CBC in one week Follow-up with cardiology as a scheduled Aspirin, Plavix and rosuvastatin.  Healthy lifestyle modification recommended  Home Health: None Equipment/Devices: None Discharge Condition: Stable CODE STATUS: Full code Diet recommendation: Low-sodium diet/heart healthy diet  Brief/Interim Summary: 65 yo male with the past medical history of T2DM, HTN, dyslipidemia, hemochromatosis and heart failure who presented with chest pain.  On his initial physical examination his blood pressure was 173/91, HR 84, RR 18, and oxygen saturation 95%, his lungs were clear to auscultation, heart with S1 and S2 present and rhythmic, abdomen soft and non tender, and no lower extremity edema.  Sodium 138, potassium 3.8, chloride 108, bicarb 23, glucose 132, BUN 19, creatinine 1.19, high sensitive troponin 4, white count 10.4, hemoglobin 14.5, hematocrit 43.5, platelets 268. SARS COVID-19 negative. EKG with 85 bpm, normal axis and normal intervals, sinus rhythm with premature atrial complexes, no significant ST segment or T wave changes.  Chest radiograph with bibasilar atelectasis with no infiltrates. Cardiology consulted who recommended patient to transfer to [facility].  Unstable angina/history of nonobstructive coronary artery disease: -Patient underwent cardiac cath on 09/06/2021 which shows 7 to 60% mid LAD stenosis, 99% mid RCA stenosis above the bifurcation to a large RV branch and the continuation of the RCA that supplies the inferobasal segment.,  Normal LV function.  -Cardiology recommended aspirin, Plavix for 12 months.  Aggressive risk factor  modification. -Cardiology cleared patient for the discharge.  -Patient is chest pain-free and comfortable going home today.  Hypertension: Remained stable on home medications amlodipine and irbesartan and metoprolol.  Hyperlipidemia: History of statin intolerance.  Patient started on low-dose of Crestor.  Continued fenofibrate   Type 2 diabetes mellitus: Continued home meds metformin, Actos, and glipizide at the time of discharge.  Hypothyroidism: Continued levothyroxine  History of hemochromatosis: H&H remained stable.  Follow-up with PCP outpatient  GERD: Continued PPI  History of multifocal atrial tachycardia: Remained stable on beta-blocker  Discharge Diagnoses:  Unstable angina Hypertension Hyperlipidemia Hypothyroidism Hemochromatosis Obesity with BMI of 33 Nonischemic cardiomyopathy Type 2 diabetes mellitus History of nonobstructive coronary artery disease status post catheterization in 2010 GERD History of multifocal atrial tachycardia    Discharge Instructions  Discharge Instructions     Amb Referral to Cardiac Rehabilitation   Complete by: As directed    Diagnosis: Coronary Stents   After initial evaluation and assessments completed: Virtual Based Care may be provided alone or in conjunction with Phase 2 Cardiac Rehab based on patient barriers.: Yes   Diet - low sodium heart healthy   Complete by: As directed    Discharge instructions   Complete by: As directed    Follow-up with PCP in 1 to 2 weeks Repeat BMP and CBC on follow-up visit with PCP Follow-up with cardiology as a scheduled Aspirin and Plavix for 1 year   Increase activity slowly   Complete by: As directed       Allergies as of 09/07/2021       Reactions   Dicyclomine Nausea And Vomiting, Other (See Comments)   Lightheaded, dizziness, SOB   Doxycycline Nausea Only   Weak, fatigue, "sick on stomach"   Lipitor [atorvastatin]    myalgias   Penicillins  Other (See Comments)   Childhood  allergy - unknown reaction   Reglan [metoclopramide] Other (See Comments)   Unknown reaction   Zocor [simvastatin] Other (See Comments)   Myalgia   Zoloft [sertraline Hcl] Other (See Comments)   Unknown reaction        Medication List     STOP taking these medications    diazepam 5 MG tablet Commonly known as: VALIUM       TAKE these medications    amLODipine 5 MG tablet Commonly known as: NORVASC Take 1 tablet (5 mg total) by mouth daily.   aspirin 81 MG EC tablet Take 81 mg by mouth daily.   B-D SINGLE USE SWABS REGULAR Pads Use as directed once daily E11.9   clopidogrel 75 MG tablet Commonly known as: PLAVIX Take 1 tablet (75 mg total) by mouth daily.   Droplet Pen Needles 32G X 4 MM Misc Generic drug: Insulin Pen Needle   fenofibrate 145 MG tablet Commonly known as: TRICOR TAKE 1 TABLET EVERY DAY   FISH OIL PO Take 1 tablet by mouth daily.   glipiZIDE 10 MG 24 hr tablet Commonly known as: GLUCOTROL XL TAKE 1 TABLET BY MOUTH DAILY WITH BREAKFAST. NEED APPOINTMENT. What changed: See the new instructions.   glucosamine-chondroitin 500-400 MG tablet Take 1 tablet by mouth daily.   levothyroxine 25 MCG tablet Commonly known as: SYNTHROID TAKE 1 TABLET EVERY DAY (NEED MD APPOINTMENT)   metFORMIN 500 MG 24 hr tablet Commonly known as: GLUCOPHAGE-XR TAKE 4 TABLETS EVERY DAY WITH BREAKFAST What changed: See the new instructions.   metoprolol succinate 100 MG 24 hr tablet Commonly known as: TOPROL-XL TAKE 1 TABLET BY MOUTH TWICE DAILY. NEED APPOINTMENT. What changed: See the new instructions.   olmesartan 40 MG tablet Commonly known as: BENICAR Take 1 tablet (40 mg total) by mouth daily.   pantoprazole 40 MG tablet Commonly known as: PROTONIX TAKE 1 TABLET BY MOUTH DAILY. ANNUAL APPOINTMENT DUE IN [facility]. MUST SEE PROVIDER FOR FUTURE REFILLS. What changed: See the new instructions.   pioglitazone 45 MG tablet Commonly known as: ACTOS TAKE 1  TABLET BY MOUTH DAILY. ANNUAL APPT DUE IN MARCH, MUST SEE PROVIDER FOR FUTURE REFILLS. What changed: See the new instructions.   ReliOn True Metrix Test Strips test strip Generic drug: glucose blood Use as instructed once daily E11.9   rosuvastatin 5 MG tablet Commonly known as: CRESTOR Take 1 tablet (5 mg total) by mouth daily. Start taking on: September 08, 2021   tadalafil 20 MG tablet Commonly known as: Cialis Take 1 tablet (20 mg total) by mouth daily as needed for erectile dysfunction.   True Metrix Level 1 Low Soln Use as directed once daily E11.9   True Metrix Meter w/Device Kit Use as directed once daily E11.9   TRUEplus Lancets 33G Misc Use as directed once daily E11.9   Vitamin D-3 25 MCG (1000 UT) Caps Take 2,000 Units by mouth daily.        Follow-up Information     Biagio Borg, MD Follow up in 1 week(s).   Specialties: Internal Medicine, Radiology Contact information: [place] [address] [phone]         Lelon Perla, MD Follow up.   Specialty: Cardiology Why: cardiology scheduler will contact you for follow up, please give [place] a call if you do not hear from our scheduler in 3 business days. Contact information: [place] [address]  [phone]                Allergies  Allergen Reactions   Dicyclomine Nausea And Vomiting and Other (See Comments)    Lightheaded, dizziness, SOB   Doxycycline Nausea Only    Weak, fatigue, "sick on stomach"   Lipitor [Atorvastatin]     myalgias   Penicillins Other (See Comments)    Childhood allergy - unknown reaction   Reglan [Metoclopramide] Other (See Comments)    Unknown reaction   Zocor [Simvastatin] Other (See Comments)    Myalgia    Zoloft [Sertraline Hcl] Other (See Comments)    Unknown reaction     Consultations: Cardiology   Procedures/Studies: DG Chest 2 View  Result Date: 09/06/2021 CLINICAL DATA:  Mid chest pain.  EXAM: CHEST - 2 VIEW COMPARISON:  November 11, 2017 FINDINGS: Very mild linear atelectasis is seen within the left lung base. There is no evidence of acute infiltrate, pleural effusion or pneumothorax. The heart size and mediastinal contours are within normal limits. The visualized skeletal structures are unremarkable. IMPRESSION: Very mild left basilar linear atelectasis. Electronically Signed   By: Virgina Norfolk M.D.   On: 09/06/2021 02:47   CARDIAC CATHETERIZATION  Result Date: 09/06/2021   Mid RCA lesion is 99% stenosed.   Prox LAD to Mid LAD lesion is 60% stenosed.   1st Diag lesion is 70% stenosed.   Acute Mrg lesion is 65% stenosed.   A stent was successfully placed.   Post intervention, there is a 0% residual stenosis.   The left ventricular systolic function is normal.   LV end diastolic pressure is normal.   The left ventricular ejection fraction is 55-65% by visual estimate. CONCLUSIONS: Unstable angina related to plaque rupture in the mid to distal RCA producing a Medina 100 bifurcation stenosis with a large acute marginal branch. Successful provisional stenting of the RCA and reduction in 99% stenosis to less than 10% with TIMI grade III flow.  There was transient total occlusion of the acute marginal branch that returned after intracoronary nitroglycerin and rewiring the vessel with a guidewire. Widely patent left main 50 to 60% mid LAD with 60 to 70% diagonal with Medina 111 pattern. Circumflex is widely patent. LV is hyperdynamic with EF 60%.  LVEDP is less than 10 mmHg. RECOMMENDATIONS: Aspirin and Plavix for 12 months.  Anticipate discharge in a.m. unless complications. Aggressive risk factor modification. Phase 1 cardiac rehab.  ECHOCARDIOGRAM COMPLETE  Result Date: 09/06/2021    ECHOCARDIOGRAM REPORT   Patient Name:   Keith [name] Date of Exam: 09/06/2021 Medical Rec #:  [id number]         Height:       71.0 in Accession #:    [id number]        Weight:       230.0 lb Date of  Birth:  04/15/1956         BSA:          2.238 m Patient Age:    39 years          BP:           137/98 mmHg Patient Gender: M                 HR:           95 bpm. Exam Location:  Inpatient Procedure: 2D Echo, 3D Echo, Cardiac Doppler, Color Doppler and Strain Analysis Indications:    Nonischemic Cardiomyopathy I42.8  History:  Patient has prior history of Echocardiogram examinations, most                 recent 09/05/2014. Heart failure with improved ejection                 fraction, nonobstructive coronary artery disease, multifocal                 atrial tachycardia, wandering atrial pacemaker, GERD,                 hypertension, hyperlipidemia, diabetes, and hypothyroidism,.  Sonographer:    Darlina Sicilian RDCS Referring Phys: [place]  1. Left ventricular ejection fraction, by estimation, is 55 to 60%. The left ventricle has normal function. The left ventricle has no regional wall motion abnormalities. Left ventricular diastolic parameters are consistent with Grade I diastolic dysfunction (impaired relaxation). The average left ventricular global longitudinal strain is -20.4 %. The global longitudinal strain is normal.  2. Right ventricular systolic function is normal. The right ventricular size is normal.  3. The mitral valve is normal in structure. No evidence of mitral valve regurgitation. No evidence of mitral stenosis.  4. The aortic valve is normal in structure. Aortic valve regurgitation is not visualized. No aortic stenosis is present.  5. There is borderline dilatation of the ascending aorta, measuring 37 mm.  6. The inferior vena cava is normal in size with greater than 50% respiratory variability, suggesting right atrial pressure of 3 mmHg. Comparison(s): Prior images unable to be directly viewed, comparison made by report only. FINDINGS  Left Ventricle: Left ventricular ejection fraction, by estimation, is 55 to 60%. The left ventricle has normal function. The left  ventricle has no regional wall motion abnormalities. The average left ventricular global longitudinal strain is -20.4 %. The global longitudinal strain is normal. 3D left ventricular ejection fraction analysis performed but not reported based on interpreter judgement due to suboptimal tracking. The left ventricular internal cavity size was normal in size. There is no left ventricular hypertrophy. Left ventricular diastolic parameters are consistent with Grade I diastolic dysfunction (impaired relaxation). Normal left ventricular filling pressure. Right Ventricle: The right ventricular size is normal. No increase in right ventricular wall thickness. Right ventricular systolic function is normal. Left Atrium: Left atrial size was normal in size. Right Atrium: Right atrial size was normal in size. Pericardium: There is no evidence of pericardial effusion. Mitral Valve: The mitral valve is normal in structure. No evidence of mitral valve regurgitation. No evidence of mitral valve stenosis. Tricuspid Valve: The tricuspid valve is normal in structure. Tricuspid valve regurgitation is not demonstrated. No evidence of tricuspid stenosis. Aortic Valve: The aortic valve is normal in structure. Aortic valve regurgitation is not visualized. No aortic stenosis is present. Pulmonic Valve: The pulmonic valve was normal in structure. Pulmonic valve regurgitation is not visualized. No evidence of pulmonic stenosis. Aorta: The aortic root is normal in size and structure. There is borderline dilatation of the ascending aorta, measuring 37 mm. Venous: The inferior vena cava is normal in size with greater than 50% respiratory variability, suggesting right atrial pressure of 3 mmHg. IAS/Shunts: No atrial level shunt detected by color flow Doppler.  LEFT VENTRICLE PLAX 2D LVIDd:         5.30 cm   Diastology LVIDs:         3.10 cm   LV e' medial:    5.33 cm/s LV PW:         1.00 cm  LV E/e' medial:  8.9 LV IVS:        1.10 cm   LV e'  lateral:   6.09 cm/s LVOT diam:     2.30 cm   LV E/e' lateral: 7.8 LV SV:         57 LV SV Index:   26        2D Longitudinal Strain LVOT Area:     4.15 cm  2D Strain GLS Avg:     -20.4 %                           3D Volume EF:                          3D EF:        52 %                          LV EDV:       141 ml                          LV ESV:       68 ml                          LV SV:        73 ml RIGHT VENTRICLE RV S prime:     14.30 cm/s TAPSE (M-mode): 2.1 cm LEFT ATRIUM             Index        RIGHT ATRIUM           Index LA diam:        3.20 cm 1.43 cm/m   RA Area:     10.50 cm LA Vol (A2C):   35.8 ml 16.00 ml/m  RA Volume:   19.10 ml  8.54 ml/m LA Vol (A4C):   34.5 ml 15.42 ml/m LA Biplane Vol: 38.2 ml 17.07 ml/m  AORTIC VALVE LVOT Vmax:   82.10 cm/s LVOT Vmean:  59.000 cm/s LVOT VTI:    0.138 m  AORTA Ao Root diam: 3.90 cm Ao Asc diam:  3.70 cm MITRAL VALVE MV Area (PHT): 5.62 cm    SHUNTS MV Decel Time: 135 msec    Systemic VTI:  0.14 m MV E velocity: 47.30 cm/s  Systemic Diam: 2.30 cm MV A velocity: 60.70 cm/s MV E/A ratio:  0.78 Mihai Croitoru MD Electronically signed by Sanda Klein MD Signature Date/Time: 09/06/2021/1:43:23 PM    Final       Subjective: Patient seen and examined.  Resting comfortably on the bed.  Tells me that he is doing fine.  No further chest pain.  Denies shortness of breath, palpitations, leg swelling, lightheadedness, dizziness, syncope, nausea, vomiting, headache, generalized weakness or lethargy.  Comfortable going home today.  Discharge Exam: Vitals:   09/07/21 0812 09/07/21 1143  BP: 116/84 117/76  Pulse:  85  Resp:  16  Temp:  98.4 F (36.9 C)  SpO2: 95% 96%   Vitals:   09/07/21 0415 09/07/21 0811 09/07/21 0812 09/07/21 1143  BP: 133/85  116/84 117/76  Pulse: 84   85  Resp: _0 Temp: 98.6 F (37 C) 98.3 F (36.8 C)  98.4 F (36.9 C)  TempSrc: Oral Oral  Oral  SpO2:  94% 96% 95% 96%  Weight:      Height:        General: Pt  is alert, awake, not in acute distress, on room air, communicating well cardiovascular: RRR, S1/S2 +, no rubs, no gallops Respiratory: CTA bilaterally, no wheezing, no rhonchi Abdominal: Soft, NT, ND, bowel sounds + Extremities: no edema, no cyanosis    The results of significant diagnostics from this hospitalization (including imaging, microbiology, ancillary and laboratory) are listed below for reference.     Microbiology: Recent Results (from the past 240 hour(s))  Resp Panel by RT-PCR (Flu A&B, Covid) Nasopharyngeal Swab     Status: None   Collection Time: 09/05/21 10:28 PM   Specimen: Nasopharyngeal Swab; Nasopharyngeal(NP) swabs in vial transport medium  Result Value Ref Range Status   SARS Coronavirus 2 by RT PCR NEGATIVE NEGATIVE Final    Comment: (NOTE) SARS-CoV-2 target nucleic acids are NOT DETECTED.  The SARS-CoV-2 RNA is generally detectable in upper respiratory specimens during the acute phase of infection. The lowest concentration of SARS-CoV-2 viral copies this assay can detect is 138 copies/mL. A negative result does not preclude SARS-Cov-2 infection and should not be used as the sole basis for treatment or other patient management decisions. A negative result may occur with  improper specimen collection/handling, submission of specimen other than nasopharyngeal swab, presence of viral mutation(s) within the areas targeted by this assay, and inadequate number of viral copies(<138 copies/mL). A negative result must be combined with clinical observations, patient history, and epidemiological information. The expected result is Negative.  Fact Sheet for Patients:  EntrepreneurPulse.com.au  Fact Sheet for Healthcare Providers:  IncredibleEmployment.be  This test is no t yet approved or cleared by the [place] FDA and  has been authorized for detection and/or diagnosis of SARS-CoV-2 by FDA under an Emergency Use  Authorization (EUA). This EUA will remain  in effect (meaning this test can be used) for the duration of the COVID-19 declaration under Section 564(b)(1) of the Act, 21 U.S.C.section 360bbb-3(b)(1), unless the authorization is terminated  or revoked sooner.       Influenza A by PCR NEGATIVE NEGATIVE Final   Influenza B by PCR NEGATIVE NEGATIVE Final    Comment: (NOTE) The Xpert Xpress SARS-CoV-2/FLU/RSV plus assay is intended as an aid in the diagnosis of influenza from Nasopharyngeal swab specimens and should not be used as a sole basis for treatment. Nasal washings and aspirates are unacceptable for Xpert Xpress SARS-CoV-2/FLU/RSV testing.  Fact Sheet for Patients: EntrepreneurPulse.com.au  Fact Sheet for Healthcare Providers: IncredibleEmployment.be  This test is not yet approved or cleared by the [place] FDA and has been authorized for detection and/or diagnosis of SARS-CoV-2 by FDA under an Emergency Use Authorization (EUA). This EUA will remain in effect (meaning this test can be used) for the duration of the COVID-19 declaration under Section 564(b)(1) of the Act, 21 U.S.C. section 360bbb-3(b)(1), unless the authorization is terminated or revoked.  Performed at [facility], [place] [address], [place], [place] [address]      Labs: BNP (last 3 results) No results for input(s): BNP in the last 8760 hours. Basic Metabolic Panel: Recent Labs  Lab 09/05/21 2220 09/06/21 1454 09/07/21 0559  NA 138  --  137  K 3.8  --  3.6  CL 108  --  108  CO2 23  --  22  GLUCOSE 132*  --  165*  BUN 19  --  11  CREATININE 1.19 1.16 1.11  CALCIUM 9.3  --  8.5*   Liver Function Tests: Recent Labs  Lab 09/05/21 2220  AST 24  ALT 26  ALKPHOS 43  BILITOT 0.7  PROT 7.8  ALBUMIN 4.5   Recent Labs  Lab 09/05/21 2220  LIPASE 39   No results for input(s): AMMONIA in the last 168 hours. CBC: Recent Labs  Lab  09/05/21 2220 09/06/21 1454 09/07/21 0559  WBC 10.4 9.3 8.9  NEUTROABS 6.4  --   --   HGB 14.5 13.1 13.0  HCT 43.5 39.5 37.8*  MCV 92.2 92.5 90.9  PLT 268 237 197   Cardiac Enzymes: No results for input(s): CKTOTAL, CKMB, CKMBINDEX, TROPONINI in the last 168 hours. BNP: Invalid input(s): POCBNP CBG: Recent Labs  Lab 09/06/21 1824 09/06/21 2205 09/07/21 0816 09/07/21 1145  GLUCAP 222* 147* 139* 180*   D-Dimer No results for input(s): DDIMER in the last 72 hours. Hgb A1c Recent Labs    09/06/21 1454  HGBA1C 7.8*   Lipid Profile Recent Labs    09/06/21 1454  CHOL 167  HDL 32*  LDLCALC 127*  TRIG 39  CHOLHDL 5.2   Thyroid function studies No results for input(s): TSH, T4TOTAL, T3FREE, THYROIDAB in the last 72 hours.  Invalid input(s): FREET3 Anemia work up No results for input(s): VITAMINB12, FOLATE, FERRITIN, TIBC, IRON, RETICCTPCT in the last 72 hours. Urinalysis    Component Value Date/Time   COLORURINE YELLOW 10/10/2020 1017   APPEARANCEUR CLEAR 10/10/2020 1017   LABSPEC 1.010 10/10/2020 1017   PHURINE 6.0 10/10/2020 1017   GLUCOSEU 250 (A) 10/10/2020 1017   HGBUR NEGATIVE 10/10/2020 1017   BILIRUBINUR NEGATIVE 10/10/2020 1017   KETONESUR NEGATIVE 10/10/2020 1017   UROBILINOGEN 0.2 10/10/2020 1017   NITRITE NEGATIVE 10/10/2020 1017   LEUKOCYTESUR NEGATIVE 10/10/2020 1017   Sepsis Labs Invalid input(s): PROCALCITONIN,  WBC,  LACTICIDVEN Microbiology Recent Results (from the past 240 hour(s))  Resp Panel by RT-PCR (Flu A&B, Covid) Nasopharyngeal Swab     Status: None   Collection Time: 09/05/21 10:28 PM   Specimen: Nasopharyngeal Swab; Nasopharyngeal(NP) swabs in vial transport medium  Result Value Ref Range Status   SARS Coronavirus 2 by RT PCR NEGATIVE NEGATIVE Final    Comment: (NOTE) SARS-CoV-2 target nucleic acids are NOT DETECTED.  The SARS-CoV-2 RNA is generally detectable in upper respiratory specimens during the acute phase of  infection. The lowest concentration of SARS-CoV-2 viral copies this assay can detect is 138 copies/mL. A negative result does not preclude SARS-Cov-2 infection and should not be used as the sole basis for treatment or other patient management decisions. A negative result may occur with  improper specimen collection/handling, submission of specimen other than nasopharyngeal swab, presence of viral mutation(s) within the areas targeted by this assay, and inadequate number of viral copies(<138 copies/mL). A negative result must be combined with clinical observations, patient history, and epidemiological information. The expected result is Negative.  Fact Sheet for Patients:  EntrepreneurPulse.com.au  Fact Sheet for Healthcare Providers:  IncredibleEmployment.be  This test is no t yet approved or cleared by the [place] FDA and  has been authorized for detection and/or diagnosis of SARS-CoV-2 by FDA under an Emergency Use Authorization (EUA). This EUA will remain  in effect (meaning this test can be used) for the duration of the COVID-19 declaration under Section 564(b)(1) of the Act, 21 U.S.C.section 360bbb-3(b)(1), unless the authorization is terminated  or revoked sooner.       Influenza A by PCR NEGATIVE NEGATIVE  Final   Influenza B by PCR NEGATIVE NEGATIVE Final    Comment: (NOTE) The Xpert Xpress SARS-CoV-2/FLU/RSV plus assay is intended as an aid in the diagnosis of influenza from Nasopharyngeal swab specimens and should not be used as a sole basis for treatment. Nasal washings and aspirates are unacceptable for Xpert Xpress SARS-CoV-2/FLU/RSV testing.  Fact Sheet for Patients: EntrepreneurPulse.com.au  Fact Sheet for Healthcare Providers: IncredibleEmployment.be  This test is not yet approved or cleared by the [place] FDA and has been authorized for detection and/or diagnosis of SARS-CoV-2  by FDA under an Emergency Use Authorization (EUA). This EUA will remain in effect (meaning this test can be used) for the duration of the COVID-19 declaration under Section 564(b)(1) of the Act, 21 U.S.C. section 360bbb-3(b)(1), unless the authorization is terminated or revoked.  Performed at [facility], [place] [address], [place], [place] [address]      Time coordinating discharge: Over 30 minutes  SIGNED:   Mckinley Jewel, MD  [facility] 09/07/2021, 12:12 PM Pager   If 7PM-7AM, please contact night-coverage [email or web address]

## 2021-09-07 NOTE — Progress Notes (Addendum)
CARDIAC REHAB PHASE I   PRE:  Rate/Rhythm: Sinus 81  BP:  Supine: 134/82       SaO2: 96%  MODE:  Ambulation: 550 ft   POST:  Rate/Rhythem: 89  BP:    Sitting: 136/84     SaO2: 97%  [id number] Dashiel ambulated independently in the hallway without complaints or chest pain. Tolerated well. Patient assisted back to bed with call bell within reach. reviewed exercise guidelines, temperature precautions, the importance of Plavix, use of sublingual nitroglycerin when to call 911.and heart healthy diabetic diet. Will place referral for phase 2 cardiac rehab although Mr Fralix is not interested in participating and did not want a brochure. Patient has stent card.  Harrell Gave RN

## 2021-09-09 LAB — GLUCOSE, CAPILLARY: Glucose-Capillary: 155 mg/dL — ABNORMAL HIGH (ref 70–99)

## 2021-09-10 MED FILL — Tirofiban HCl in NaCl 0.9% IV Soln 5 MG/100ML (Base Equiv): INTRAVENOUS | Qty: 100 | Status: AC

## 2021-09-19 ENCOUNTER — Other Ambulatory Visit: Payer: Self-pay

## 2021-09-19 ENCOUNTER — Ambulatory Visit (INDEPENDENT_AMBULATORY_CARE_PROVIDER_SITE_OTHER): Payer: Medicare HMO | Admitting: Internal Medicine

## 2021-09-19 ENCOUNTER — Encounter: Payer: Self-pay | Admitting: Internal Medicine

## 2021-09-19 VITALS — BP 118/62 | HR 68 | Temp 98.9°F | Ht 70.0 in | Wt 233.0 lb

## 2021-09-19 DIAGNOSIS — E119 Type 2 diabetes mellitus without complications: Secondary | ICD-10-CM

## 2021-09-19 DIAGNOSIS — E7800 Pure hypercholesterolemia, unspecified: Secondary | ICD-10-CM

## 2021-09-19 DIAGNOSIS — I10 Essential (primary) hypertension: Secondary | ICD-10-CM | POA: Diagnosis not present

## 2021-09-19 DIAGNOSIS — K219 Gastro-esophageal reflux disease without esophagitis: Secondary | ICD-10-CM

## 2021-09-19 DIAGNOSIS — Z794 Long term (current) use of insulin: Secondary | ICD-10-CM

## 2021-09-19 MED ORDER — CLOPIDOGREL BISULFATE 75 MG PO TABS
75.0000 mg | ORAL_TABLET | Freq: Every day | ORAL | 3 refills | Status: DC
Start: 2021-09-19 — End: 2022-07-14

## 2021-09-19 MED ORDER — LEVOTHYROXINE SODIUM 25 MCG PO TABS
ORAL_TABLET | ORAL | 3 refills | Status: DC
Start: 2021-09-19 — End: 2022-07-14

## 2021-09-19 MED ORDER — AMLODIPINE BESYLATE 5 MG PO TABS: 5.0000 mg | ORAL_TABLET | Freq: Every day | ORAL | 3 refills | Status: DC

## 2021-09-19 MED ORDER — PIOGLITAZONE HCL 45 MG PO TABS: 45.0000 mg | ORAL_TABLET | Freq: Every day | ORAL | 3 refills | Status: DC

## 2021-09-19 MED ORDER — FENOFIBRATE 145 MG PO TABS: 145.0000 mg | ORAL_TABLET | Freq: Every day | ORAL | 3 refills | Status: DC

## 2021-09-19 MED ORDER — GLIPIZIDE ER 10 MG PO TB24
10.0000 mg | ORAL_TABLET | Freq: Every day | ORAL | 3 refills | Status: DC
Start: 2021-09-19 — End: 2021-11-08

## 2021-09-19 MED ORDER — OLMESARTAN MEDOXOMIL 40 MG PO TABS: 40.0000 mg | ORAL_TABLET | Freq: Every day | ORAL | 3 refills | Status: DC

## 2021-09-19 MED ORDER — METOPROLOL SUCCINATE ER 100 MG PO TB24
100.0000 mg | ORAL_TABLET | Freq: Two times a day (BID) | ORAL | 3 refills | Status: DC
Start: 2021-09-19 — End: 2022-07-14

## 2021-09-19 MED ORDER — CLOPIDOGREL BISULFATE 75 MG PO TABS: 75.0000 mg | ORAL_TABLET | Freq: Every day | ORAL | 3 refills | Status: DC

## 2021-09-19 MED ORDER — METFORMIN HCL ER 500 MG PO TB24
2000.0000 mg | ORAL_TABLET | Freq: Every day | ORAL | 3 refills | Status: DC
Start: 2021-09-19 — End: 2022-07-14

## 2021-09-19 MED ORDER — ROSUVASTATIN CALCIUM 5 MG PO TABS: 5.0000 mg | ORAL_TABLET | Freq: Every day | ORAL | 3 refills | Status: DC

## 2021-09-19 MED ORDER — PANTOPRAZOLE SODIUM 40 MG PO TBEC
40.0000 mg | DELAYED_RELEASE_TABLET | Freq: Every day | ORAL | 3 refills | Status: DC
Start: 2021-09-19 — End: 2022-07-14

## 2021-09-19 NOTE — Patient Instructions (Signed)
Ok to restart the Protonix so the stomach pain is not confused with heart pain  Please continue all other medications as before, and refills have been done if requested.  Please have the pharmacy call with any other refills you may need.  Please continue your efforts at being more active, low cholesterol diet, and weight control.  Please keep your appointments with your specialists as you may have planned

## 2021-09-19 NOTE — Progress Notes (Signed)
Patient ID: NYHEIM [name], male   DOB: Nov 02, 1955, 66 y.o.   MRN: [id number]        Chief Complaint: follow up worsening reflux       HPI:  Keith [name] is a 66 y.o. male c/o mild worsening reflux, but no abd pain, dysphagia, n/v, bowel change or blood. Has been out of protonix for several weeks.   Pt denies chest pain, increased sob or doe, wheezing, orthopnea, PND, increased LE swelling, palpitations, dizziness or syncope.   Pt denies polydipsia, polyuria, or new focal neuro s/s.   Pt denies fever, wt loss, night sweats, loss of appetite, or other constitutional symptoms  Has cardiology f/u jan 26.   Wt Readings from Last 3 Encounters:  09/19/21 233 lb (105.7 kg)  09/06/21 230 lb 14.4 oz (104.7 kg)  08/01/21 242 lb (109.8 kg)   BP Readings from Last 3 Encounters:  09/19/21 118/62  09/07/21 117/76  08/01/21 (!) 121/91         Past Medical History:  Diagnosis Date   Anxiety    Arthritis    Cervical spine pain    Chronic   Chronic HFimpEF (heart failure with improved ejection fraction) ([place])    a. 01/2010 Echo: EF 30%, diff HK; b. 08/2014 Echo: EF 50%, no rwma, GrI DD.   Diabetes mellitus    Diverticulosis    Fatty liver    GERD (gastroesophageal reflux disease)    Hereditary hemochromatosis    Compound heterozygote. Pt gets periodic phlebotomies   History of colonic polyps    History of NICM (nonischemic cardiomyopathy) w/ subsequent improvement in LV function(HCC)    a. Echo (5/11) showed EF 30% with diffuse hypokinesis, normal wall thickness, mildly decreased RV systolic function. This may be a cardiomyopathy due to hemochromatosis versus tachy-mediated; b. 08/2014 Echo: EF 50%, no rwma, GrI DD.   Hyperlipidemia    Hypertension    Hypothyroidism    Low back pain    The pt is S/P spinal fusion surgeries   Multifocal atrial tachycardia ([place])    a. Holter monior in 5/11 showed frequent runs of symtomatic MAT with heart rate around 100-->prev on amio-->now on bb only.    Nonobstructive CAD (coronary artery disease)    a. 11/2008 MV: EF 50%, Inf HK, inferior scar and ischemia in the apical anterior septum and in the inferior wall; b. 11/2008 Cath: LM nl, LAD min irregs, LCX nl, OM1/2 nl, RCA nl.   Peptic ulcer disease    Thyroid nodule    Wandering atrial pacemaker    Past Surgical History:  Procedure Laterality Date   CORONARY STENT INTERVENTION [name] 09/06/2021   Procedure: CORONARY STENT INTERVENTION;  Surgeon: Belva Crome, MD;  Location: [facility];  Service: Cardiovascular;  Laterality: [name];   INGUINAL HERNIA REPAIR Right    LEFT HEART CATH AND CORONARY ANGIOGRAPHY [name] 09/06/2021   Procedure: LEFT HEART CATH AND CORONARY ANGIOGRAPHY;  Surgeon: Belva Crome, MD;  Location: [facility];  Service: Cardiovascular;  Laterality: [name];   LUMBAR [place] SURGERY  09/15/1989   TONSILLECTOMY      reports that he quit smoking about 18 years ago. His smoking use included cigarettes. He has never used smokeless tobacco. He reports that he does not currently use alcohol. He reports that he does not currently use drugs. family history includes Alcohol abuse in his father; Colitis in his mother; Colon polyps in his mother; Diabetes in his mother, paternal grandfather,  and paternal grandmother; Hypertension in his father; Multiple sclerosis in his mother and sister. Allergies  Allergen Reactions   Dicyclomine Nausea And Vomiting and Other (See Comments)    Lightheaded, dizziness, SOB   Doxycycline Nausea Only    Weak, fatigue, "sick on stomach"   Lipitor [Atorvastatin]     myalgias   Penicillins Other (See Comments)    Childhood allergy - unknown reaction   Reglan [Metoclopramide] Other (See Comments)    Unknown reaction   Zocor [Simvastatin] Other (See Comments)    Myalgia    Zoloft [Sertraline Hcl] Other (See Comments)    Unknown reaction    Current Outpatient Medications on File Prior to Visit  Medication Sig Dispense Refill   Alcohol Swabs  (B-D SINGLE USE SWABS REGULAR) PADS Use as directed once daily E11.9 100 each 3   aspirin 81 MG EC tablet Take 81 mg by mouth daily.     Blood Glucose Calibration (TRUE METRIX LEVEL 1) Low SOLN Use as directed once daily E11.9 1 each 3   Blood Glucose Monitoring Suppl (TRUE METRIX METER) w/Device KIT Use as directed once daily E11.9 1 kit 0   Cholecalciferol (VITAMIN D-3) 25 MCG (1000 UT) CAPS Take 2,000 Units by mouth daily.     DROPLET PEN NEEDLES 32G X 4 MM MISC      glucosamine-chondroitin 500-400 MG tablet Take 1 tablet by mouth daily.     glucose blood (RELION TRUE METRIX TEST STRIPS) test strip Use as instructed once daily E11.9 100 each 12   Omega-3 Fatty Acids (FISH OIL PO) Take 1 tablet by mouth daily.      tadalafil (CIALIS) 20 MG tablet Take 1 tablet (20 mg total) by mouth daily as needed for erectile dysfunction. 30 tablet 11   TRUEplus Lancets 33G MISC Use as directed once daily E11.9 100 each 12   No current facility-administered medications on file prior to visit.        ROS:  All others reviewed and negative.  Objective        PE:  BP 118/62 (BP Location: Right Arm, Patient Position: Sitting, Cuff Size: Large)    Pulse 68    Temp 98.9 F (37.2 C) (Oral)    Ht 5' 10" (1.778 m)    Wt 233 lb (105.7 kg)    SpO2 96%    BMI 33.43 kg/m                 Constitutional: Pt appears in NAD               HENT: Head: NCAT.                Right Ear: External ear normal.                 Left Ear: External ear normal.                Eyes: . Pupils are equal, round, and reactive to light. Conjunctivae and EOM are normal               Nose: without d/c or deformity               Neck: Neck supple. Gross normal ROM               Cardiovascular: Normal rate and regular rhythm.                 Pulmonary/Chest: Effort normal and breath sounds without rales  or wheezing.                Abd:  Soft, NT, ND, + BS, no organomegaly               Neurological: Pt is alert. At baseline orientation,  motor grossly intact               Skin: Skin is warm. No rashes, no other new lesions, LE edema - none               Psychiatric: Pt behavior is normal without agitation   Micro: none  Cardiac tracings I have personally interpreted today:  none  Pertinent Radiological findings (summarize): none   Lab Results  Component Value Date   WBC 8.9 09/07/2021   HGB 13.0 09/07/2021   HCT 37.8 (L) 09/07/2021   PLT 197 09/07/2021   GLUCOSE 165 (H) 09/07/2021   CHOL 167 09/06/2021   TRIG 39 09/06/2021   HDL 32 (L) 09/06/2021   LDLDIRECT 127.0 11/15/2019   LDLCALC 127 (H) 09/06/2021   ALT 26 09/05/2021   AST 24 09/05/2021   NA 137 09/07/2021   K 3.6 09/07/2021   CL 108 09/07/2021   CREATININE 1.11 09/07/2021   BUN 11 09/07/2021   CO2 22 09/07/2021   TSH 4.30 10/10/2020   PSA 0.94 10/10/2020   INR 1.1 (H) 07/09/2016   HGBA1C 7.8 (H) 09/06/2021   MICROALBUR <0.7 10/10/2020   Assessment/Plan:  ALADDIN KOLLMANN is a 66 y.o. White or Caucasian [1] male with  has a past medical history of Anxiety, Arthritis, Cervical spine pain, Chronic HFimpEF (heart failure with improved ejection fraction) ([place]), Diabetes mellitus, Diverticulosis, Fatty liver, GERD (gastroesophageal reflux disease), Hereditary hemochromatosis, History of colonic polyps, History of NICM (nonischemic cardiomyopathy) w/ subsequent improvement in LV function(HCC), Hyperlipidemia, Hypertension, Hypothyroidism, Low back pain, Multifocal atrial tachycardia ([facility]), Nonobstructive CAD (coronary artery disease), Peptic ulcer disease, Thyroid nodule, and Wandering atrial pacemaker.  GERD Mild to mod worsening, for restart PPI,  to f/u any worsening symptoms or concerns   Essential hypertension BP Readings from Last 3 Encounters:  09/19/21 118/62  09/07/21 117/76  08/01/21 (!) 121/91   Stable, pt to continue medical treatment norvasc, toprol, benicar   Hyperlipidemia Lab Results  Component Value Date   Equality 127 (H)  09/06/2021   Has had difficutly with statin tolerance, pt to continue current statin crestor 5, lower chol diet   Insulin dependent type 2 diabetes mellitus, controlled ([place]) Lab Results  Component Value Date   HGBA1C 7.8 (H) 09/06/2021   Stable, pt to continue current medical treatment glucophage, metformin, actos  Followup: Return in about 1 month (around 10/22/2021).  Cathlean Cower, MD 09/22/2021 5:19 PM [facility]

## 2021-09-22 ENCOUNTER — Encounter: Payer: Self-pay | Admitting: Internal Medicine

## 2021-09-22 NOTE — Assessment & Plan Note (Signed)
Mild to mod worsening, for restart PPI,  to f/u any worsening symptoms or concerns

## 2021-09-22 NOTE — Assessment & Plan Note (Signed)
Lab Results  Component Value Date   HGBA1C 7.8 (H) 09/06/2021   Stable, pt to continue current medical treatment glucophage, metformin, actos

## 2021-09-22 NOTE — Assessment & Plan Note (Signed)
Lab Results  Component Value Date   LDLCALC 127 (H) 09/06/2021   Has had difficutly with statin tolerance, pt to continue current statin crestor 5, lower chol diet

## 2021-09-22 NOTE — Assessment & Plan Note (Signed)
BP Readings from Last 3 Encounters:  09/19/21 118/62  09/07/21 117/76  08/01/21 (!) 121/91   Stable, pt to continue medical treatment norvasc, toprol, benicar

## 2021-10-03 NOTE — Progress Notes (Signed)
Cardiology Office Note:    Date:  10/10/2021   ID:  Keith [name], DOB [birth year], MRN [id number]  PCP:  Biagio Borg, MD   [facility] Providers Cardiologist:  Kirk Ruths, MD { Referring MD: Biagio Borg, MD   Chief Complaint  Patient presents with   Follow-up    Post-cath, CAD    History of Present Illness:    Keith [name] is a 66 y.o. male with a hx of nonischemic cardiomyopathy, chronic systolic heart failure with improved EF (50% Dec 2015), nonobstructive CAD on heart cath 2010, multifocal atrial tachycardia, wandering pacemaker, GERD, HTN, HLD, DM, and hypothyroidism.  Patient had a heart catheterization in 2010 as a result of a positive stress test.  Diagnostic catheterization showed only minor irregularities in the LAD and otherwise normal coronaries.  Follow-up echocardiogram in May 2011 showed reduced EF of 30% with diffuse hypokinesis.  He was treated as nonischemic cardiomyopathy potentially felt to be secondary to multifocal atrial tachycardia and/or hereditary hemochromatosis.  Atrial tachycardia was noted on a Holter monitor in May 2011 and managed with amiodarone.  However patient was intolerant to amiodarone and this was discontinued.  Follow-up echocardiogram December 2015 with an LVEF improved to 50% and no regional wall motion abnormalities, grade 1 diastolic dysfunction.  He was lost to follow-up since 2016.  He presented to [facility] ED 09/06/2021 with symptoms concerning for angina.  He underwent heart catheterization on 09/06/2021 with plaque rupture in the mid to distal RCA producing a Medina 100 bifurcation stenosis with a large acute marginal branch.  This was successfully stented with reduction in 99% stenosis to less than 10% with TIMI grade III flow.  There was transient total occlusion of the acute marginal branch that returned after intracoronary nitroglycerin and rewiring the vessel with guidewire.  He has residual 50 to 60% mid LAD with 60 to 70%  diagonal stenosis. He was discharged on ASA, plavix, 5 mg crestor, fish oil, BB, benicar, amlodipine, and tricor.  He presents for post cath follow up. He is doing well. He describes a chest sensation that sounds consistent with GERD, relieved with rolaids. I suggested trial of zantac to add to his protonix. He is walking without angina. No complaints today, no bleeding problems.    Past Medical History:  Diagnosis Date   Anxiety    Arthritis    Cervical spine pain    Chronic   Chronic HFimpEF (heart failure with improved ejection fraction) ([place])    a. 01/2010 Echo: EF 30%, diff HK; b. 08/2014 Echo: EF 50%, no rwma, GrI DD.   Diabetes mellitus    Diverticulosis    Fatty liver    GERD (gastroesophageal reflux disease)    Hereditary hemochromatosis    Compound heterozygote. Pt gets periodic phlebotomies   History of colonic polyps    History of NICM (nonischemic cardiomyopathy) w/ subsequent improvement in LV function(HCC)    a. Echo (5/11) showed EF 30% with diffuse hypokinesis, normal wall thickness, mildly decreased RV systolic function. This may be a cardiomyopathy due to hemochromatosis versus tachy-mediated; b. 08/2014 Echo: EF 50%, no rwma, GrI DD.   Hyperlipidemia    Hypertension    Hypothyroidism    Low back pain    The pt is S/P spinal fusion surgeries   Multifocal atrial tachycardia ([place])    a. Holter monior in 5/11 showed frequent runs of symtomatic MAT with heart rate around 100-->prev on amio-->now on bb only.   Nonobstructive CAD (coronary  artery disease)    a. 11/2008 MV: EF 50%, Inf HK, inferior scar and ischemia in the apical anterior septum and in the inferior wall; b. 11/2008 Cath: LM nl, LAD min irregs, LCX nl, OM1/2 nl, RCA nl.   Peptic ulcer disease    Thyroid nodule    Wandering atrial pacemaker     Past Surgical History:  Procedure Laterality Date   CORONARY STENT INTERVENTION [name] 09/06/2021   Procedure: CORONARY STENT INTERVENTION;  Surgeon: Belva Crome,  MD;  Location: [place] [facility];  Service: Cardiovascular;  Laterality: [name];   INGUINAL HERNIA REPAIR Right    LEFT HEART CATH AND CORONARY ANGIOGRAPHY [name] 09/06/2021   Procedure: LEFT HEART CATH AND CORONARY ANGIOGRAPHY;  Surgeon: Belva Crome, MD;  Location: [place] [facility];  Service: Cardiovascular;  Laterality: [name];   LUMBAR DISC SURGERY  09/15/1989   TONSILLECTOMY      Current Medications: Current Meds  Medication Sig   Alcohol Swabs (B-D SINGLE USE SWABS REGULAR) PADS Use as directed once daily E11.9   amLODipine (NORVASC) 5 MG tablet Take 1 tablet (5 mg total) by mouth daily.   aspirin 81 MG EC tablet Take 81 mg by mouth daily.   Blood Glucose Calibration (TRUE METRIX LEVEL 1) Low SOLN Use as directed once daily E11.9   Blood Glucose Monitoring Suppl (TRUE METRIX METER) w/Device KIT Use as directed once daily E11.9   Cholecalciferol (VITAMIN D-3) 25 MCG (1000 UT) CAPS Take 2,000 Units by mouth daily.   clopidogrel (PLAVIX) 75 MG tablet Take 1 tablet (75 mg total) by mouth daily.   DROPLET PEN NEEDLES 32G X 4 MM MISC    fenofibrate (TRICOR) 145 MG tablet Take 1 tablet (145 mg total) by mouth daily.   glipiZIDE (GLUCOTROL XL) 10 MG 24 hr tablet Take 1 tablet (10 mg total) by mouth daily with breakfast.   glucosamine-chondroitin 500-400 MG tablet Take 1 tablet by mouth daily.   glucose blood (RELION TRUE METRIX TEST STRIPS) test strip Use as instructed once daily E11.9   levothyroxine (SYNTHROID) 25 MCG tablet TAKE 1 TABLET EVERY DAY (NEED MD APPOINTMENT)   metFORMIN (GLUCOPHAGE-XR) 500 MG 24 hr tablet Take 4 tablets (2,000 mg total) by mouth daily with breakfast.   metoprolol succinate (TOPROL-XL) 100 MG 24 hr tablet Take 1 tablet (100 mg total) by mouth 2 (two) times daily. Take with or immediately following a meal.   olmesartan (BENICAR) 40 MG tablet Take 1 tablet (40 mg total) by mouth daily.   Omega-3 Fatty Acids (FISH OIL PO) Take 1 tablet by mouth daily.    pantoprazole  (PROTONIX) 40 MG tablet Take 1 tablet (40 mg total) by mouth daily.   pioglitazone (ACTOS) 45 MG tablet Take 1 tablet (45 mg total) by mouth daily.   rosuvastatin (CRESTOR) 5 MG tablet Take 1 tablet (5 mg total) by mouth daily.   tadalafil (CIALIS) 20 MG tablet Take 1 tablet (20 mg total) by mouth daily as needed for erectile dysfunction.   TRUEplus Lancets 33G MISC Use as directed once daily E11.9     Allergies:   Dicyclomine, Doxycycline, Lipitor [atorvastatin], Penicillins, Reglan [metoclopramide], Zocor [simvastatin], and Zoloft [sertraline hcl]   Social History   Socioeconomic History   Marital status: Married    Spouse name: Not on file   Number of children: 3   Years of education: Not on file   Highest education level: Not on file  Occupational History   Occupation: Disabled  Employer: UNEMPLOYED  Tobacco Use   Smoking status: Former    Types: Cigarettes    Quit date: 09/16/2003    Years since quitting: 18.0   Smokeless tobacco: Never  Vaping Use   Vaping Use: Never used  Substance and Sexual Activity   Alcohol use: Not Currently    Comment: social - 1 drink every few months.   Drug use: Not Currently    Comment: Quit in 1980   Sexual activity: Yes  Other Topics Concern   Not on file  Social History Narrative   Lives in [place] by himself.  Does not routinely exercise.   Social Determinants of Health   Financial Resource Strain: Not on file  Food Insecurity: Not on file  Transportation Needs: Not on file  Physical Activity: Not on file  Stress: Not on file  Social Connections: Not on file     Family History: The patient's family history includes Alcohol abuse in his father; Colitis in his mother; Colon polyps in his mother; Diabetes in his mother, paternal grandfather, and paternal grandmother; Hypertension in his father; Multiple sclerosis in his mother and sister. There is no history of Colon cancer.  ROS:   Please see the history of present illness.      All other systems reviewed and are negative.  EKGs/Labs/Other Studies Reviewed:    The following studies were reviewed today:  Left heart cath 09/06/21:   Mid RCA lesion is 99% stenosed.   Prox LAD to Mid LAD lesion is 60% stenosed.   1st Diag lesion is 70% stenosed.   Acute Mrg lesion is 65% stenosed.   A stent was successfully placed.   Post intervention, there is a 0% residual stenosis.   The left ventricular systolic function is normal.   LV end diastolic pressure is normal.   The left ventricular ejection fraction is 55-65% by visual estimate.   CONCLUSIONS: Unstable angina related to plaque rupture in the mid to distal RCA producing a Medina 100 bifurcation stenosis with a large acute marginal branch. Successful provisional stenting of the RCA and reduction in 99% stenosis to less than 10% with TIMI grade III flow.  There was transient total occlusion of the acute marginal branch that returned after intracoronary nitroglycerin and rewiring the vessel with a guidewire. Widely patent left main 50 to 60% mid LAD with 60 to 70% diagonal with Medina 111 pattern. Circumflex is widely patent. LV is hyperdynamic with EF 60%.  LVEDP is less than 10 mmHg.   RECOMMENDATIONS:   Aspirin and Plavix for 12 months.  Anticipate discharge in a.m. unless complications. Aggressive risk factor modification. Phase 1 cardiac rehab.   Echo 09/06/21:  1. Left ventricular ejection fraction, by estimation, is 55 to 60%. The  left ventricle has normal function. The left ventricle has no regional  wall motion abnormalities. Left ventricular diastolic parameters are  consistent with Grade I diastolic  dysfunction (impaired relaxation). The average left ventricular global  longitudinal strain is -20.4 %. The global longitudinal strain is normal.   2. Right ventricular systolic function is normal. The right ventricular  size is normal.   3. The mitral valve is normal in structure. No evidence of  mitral valve  regurgitation. No evidence of mitral stenosis.   4. The aortic valve is normal in structure. Aortic valve regurgitation is  not visualized. No aortic stenosis is present.   5. There is borderline dilatation of the ascending aorta, measuring 37  mm.   6.  The inferior vena cava is normal in size with greater than 50%  respiratory variability, suggesting right atrial pressure of 3 mmHg.    EKG:  EKG is  ordered today.  The ekg ordered today demonstrates sinus rhythm HR 75  Recent Labs: 09/05/2021: ALT 26 09/07/2021: BUN 11; Creatinine, Ser 1.11; Hemoglobin 13.0; Platelets 197; Potassium 3.6; Sodium 137  Recent Lipid Panel    Component Value Date/Time   CHOL 167 09/06/2021 1454   TRIG 39 09/06/2021 1454   HDL 32 (L) 09/06/2021 1454   CHOLHDL 5.2 09/06/2021 1454   VLDL 8 09/06/2021 1454   LDLCALC 127 (H) 09/06/2021 1454   LDLDIRECT 127.0 11/15/2019 0844     Risk Assessment/Calculations:           Physical Exam:    VS:  BP 140/80    Pulse 75    Ht _0  (1.778 m)    Wt 236 lb 12.8 oz (107.4 kg)    SpO2 98%    BMI 33.98 kg/m     Wt Readings from Last 3 Encounters:  10/10/21 236 lb 12.8 oz (107.4 kg)  09/19/21 233 lb (105.7 kg)  09/06/21 230 lb 14.4 oz (104.7 kg)     GEN:  Well nourished, well developed in no acute distress HEENT: Normal NECK: No JVD; No carotid bruits LYMPHATICS: No lymphadenopathy CARDIAC: RRR, no murmurs, rubs, gallops RESPIRATORY:  Clear to auscultation without rales, wheezing or rhonchi  ABDOMEN: Soft, non-tender, non-distended MUSCULOSKELETAL:  No edema; No deformity  SKIN: Warm and dry NEUROLOGIC:  Alert and oriented x 3 PSYCHIATRIC:  Normal affect   ASSESSMENT:    1. Coronary artery disease involving native heart, unspecified vessel or lesion type, unspecified whether angina present   2. Hyperlipidemia, unspecified hyperlipidemia type   3. Essential hypertension   4. NICM (nonischemic cardiomyopathy) ([place])   5. Type 2  diabetes mellitus with complication, without long-term current use of insulin (HCC)    PLAN:    In order of problems listed above:  CAD s/p DES to RCA - residual disease managed medically - continue DAPT with ASA and plavix - continue BB - no bleeding problems - long discussion regarding risk factor modification, including lipids and DM - he is walking without angina   Hyperlipidemia with LDL goal < 70 09/06/2021: Cholesterol 167; HDL 32; LDL Cholesterol 127; Triglycerides 39; VLDL 8 - previously statin intolerant - started on 5 mg crestor Crestor 5 mg, fish oil, tricor - he is doing well on 5 mg crestor but is hesitant to increase the dose - we will recheck his lipid panel today - but likely referral to lipid clinic for [id number]   Hypertension - medications as above, no changes   Hx of NICM, systolic heart failure - EF improved in 2015, felt secondary to multifocal atrial tachycardia   DM - A1c 7.8% - on metformin, glipizide, actos - consider SGLT2i - will defer to PCP   Follow up in 6 months. Lipid panel today ?lipid clinic referral to pharmD     Medication Adjustments/Labs and Tests Ordered: Current medicines are reviewed at length with the patient today.  Concerns regarding medicines are outlined above.  Orders Placed This Encounter  Procedures   Lipid panel   AMB Referral to [facility] Pharm-D   EKG 12-Lead   No orders of the defined types were placed in this encounter.   Patient Instructions  Medication Instructions:  Your physician recommends that you continue on your current medications as directed.  Please refer to the Current Medication list given to you today.   *If you need a refill on your cardiac medications before your next appointment, please call your pharmacy*   Lab Work: Your physician recommends that you return for lab work today Lipid  If you have labs (blood work) drawn today and your tests are completely normal, you will receive your  results only by: [place] (if you have MyChart) OR A paper copy in the mail If you have any lab test that is abnormal or we need to change your treatment, we will call you to review the results.   Testing/Procedures: NONE ordered at this time of appointment     Follow-Up: At [facility], you and your health needs are our priority.  As part of our continuing mission to provide you with exceptional heart care, we have created designated Provider Care Teams.  These Care Teams include your primary Cardiologist (physician) and Advanced Practice Providers (APPs -  Physician Assistants and Nurse Practitioners) who all work together to provide you with the care you need, when you need it.  We recommend signing up for the patient portal called "MyChart".  Sign up information is provided on this After Visit Summary.  MyChart is used to connect with patients for Virtual Visits (Telemedicine).  Patients are able to view lab/test results, encounter notes, upcoming appointments, etc.  Non-urgent messages can be sent to your provider as well.   To learn more about what you can do with MyChart, go to [email or web address].    Your next appointment:   6 Months  The format for your next appointment:   In Person  Provider:   Kirk Ruths, MD     Other Instructions Referral to Pharm D (lipid clinic)       Signed, Ledora Bottcher, PA  10/10/2021 11:17 AM    [place]

## 2021-10-10 ENCOUNTER — Encounter: Payer: Self-pay | Admitting: Physician Assistant

## 2021-10-10 ENCOUNTER — Ambulatory Visit: Payer: Medicare HMO | Admitting: Physician Assistant

## 2021-10-10 ENCOUNTER — Other Ambulatory Visit: Payer: Self-pay

## 2021-10-10 VITALS — BP 140/80 | HR 75 | Ht 70.0 in | Wt 236.8 lb

## 2021-10-10 DIAGNOSIS — I10 Essential (primary) hypertension: Secondary | ICD-10-CM | POA: Diagnosis not present

## 2021-10-10 DIAGNOSIS — I2510 Atherosclerotic heart disease of native coronary artery without angina pectoris: Secondary | ICD-10-CM | POA: Diagnosis not present

## 2021-10-10 DIAGNOSIS — E118 Type 2 diabetes mellitus with unspecified complications: Secondary | ICD-10-CM

## 2021-10-10 DIAGNOSIS — I428 Other cardiomyopathies: Secondary | ICD-10-CM | POA: Diagnosis not present

## 2021-10-10 DIAGNOSIS — E785 Hyperlipidemia, unspecified: Secondary | ICD-10-CM

## 2021-10-10 LAB — LIPID PANEL
Chol/HDL Ratio: 3.8 ratio (ref 0.0–5.0)
Cholesterol, Total: 137 mg/dL (ref 100–199)
HDL: 36 mg/dL — ABNORMAL LOW (ref >39)
LDL Chol Calc (NIH): 81 mg/dL (ref 0–99)
Triglycerides: 106 mg/dL (ref 0–149)
VLDL Cholesterol Cal: 20 mg/dL (ref 5–40)

## 2021-10-10 NOTE — Patient Instructions (Signed)
Medication Instructions:  Your physician recommends that you continue on your current medications as directed. Please refer to the Current Medication list given to you today.   *If you need a refill on your cardiac medications before your next appointment, please call your pharmacy*   Lab Work: Your physician recommends that you return for lab work today Lipid  If you have labs (blood work) drawn today and your tests are completely normal, you will receive your results only by: [place] (if you have MyChart) OR A paper copy in the mail If you have any lab test that is abnormal or we need to change your treatment, we will call you to review the results.   Testing/Procedures: NONE ordered at this time of appointment     Follow-Up: At [facility], you and your health needs are our priority.  As part of our continuing mission to provide you with exceptional heart care, we have created designated Provider Care Teams.  These Care Teams include your primary Cardiologist (physician) and Advanced Practice Providers (APPs -  Physician Assistants and Nurse Practitioners) who all work together to provide you with the care you need, when you need it.  We recommend signing up for the patient portal called "MyChart".  Sign up information is provided on this After Visit Summary.  MyChart is used to connect with patients for Virtual Visits (Telemedicine).  Patients are able to view lab/test results, encounter notes, upcoming appointments, etc.  Non-urgent messages can be sent to your provider as well.   To learn more about what you can do with MyChart, go to [email or web address].    Your next appointment:   6 Months  The format for your next appointment:   In Person  Provider:   Kirk Ruths, MD     Other Instructions Referral to Pharm D (lipid clinic)

## 2021-10-13 ENCOUNTER — Telehealth: Payer: Self-pay | Admitting: Physician Assistant

## 2021-10-13 NOTE — Telephone Encounter (Signed)
error 

## 2021-10-21 ENCOUNTER — Other Ambulatory Visit (INDEPENDENT_AMBULATORY_CARE_PROVIDER_SITE_OTHER): Payer: Medicare HMO

## 2021-10-21 DIAGNOSIS — Z0000 Encounter for general adult medical examination without abnormal findings: Secondary | ICD-10-CM

## 2021-10-21 DIAGNOSIS — E1165 Type 2 diabetes mellitus with hyperglycemia: Secondary | ICD-10-CM | POA: Diagnosis not present

## 2021-10-21 DIAGNOSIS — E538 Deficiency of other specified B group vitamins: Secondary | ICD-10-CM | POA: Diagnosis not present

## 2021-10-21 DIAGNOSIS — E559 Vitamin D deficiency, unspecified: Secondary | ICD-10-CM

## 2021-10-21 LAB — LIPID PANEL
Cholesterol: 120 mg/dL (ref 0–200)
HDL: 33 mg/dL — ABNORMAL LOW (ref >39.00)
LDL Cholesterol: 71 mg/dL (ref 0–99)
NonHDL: 87.45
Total CHOL/HDL Ratio: 4
Triglycerides: 81 mg/dL (ref 0.0–149.0)
VLDL: 16.2 mg/dL (ref 0.0–40.0)

## 2021-10-21 LAB — CBC WITH DIFFERENTIAL/PLATELET
Basophils Absolute: 0.1 10*3/uL (ref 0.0–0.1)
Basophils Relative: 0.7 % (ref 0.0–3.0)
Eosinophils Absolute: 0.2 10*3/uL (ref 0.0–0.7)
Eosinophils Relative: 2.2 % (ref 0.0–5.0)
HCT: 41 % (ref 39.0–52.0)
Hemoglobin: 13.7 g/dL (ref 13.0–17.0)
Lymphocytes Relative: 23.5 % (ref 12.0–46.0)
Lymphs Abs: 1.7 10*3/uL (ref 0.7–4.0)
MCHC: 33.4 g/dL (ref 30.0–36.0)
MCV: 91.1 fl (ref 78.0–100.0)
Monocytes Absolute: 0.6 10*3/uL (ref 0.1–1.0)
Monocytes Relative: 8.7 % (ref 3.0–12.0)
Neutro Abs: 4.7 10*3/uL (ref 1.4–7.7)
Neutrophils Relative %: 64.9 % (ref 43.0–77.0)
Platelets: 184 10*3/uL (ref 150.0–400.0)
RBC: 4.5 Mil/uL (ref 4.22–5.81)
RDW: 14.5 % (ref 11.5–15.5)
WBC: 7.3 10*3/uL (ref 4.0–10.5)

## 2021-10-21 LAB — HEPATIC FUNCTION PANEL
ALT: 15 U/L (ref 0–53)
AST: 16 U/L (ref 0–37)
Albumin: 4.3 g/dL (ref 3.5–5.2)
Alkaline Phosphatase: 36 U/L — ABNORMAL LOW (ref 39–117)
Bilirubin, Direct: 0.1 mg/dL (ref 0.0–0.3)
Total Bilirubin: 0.5 mg/dL (ref 0.2–1.2)
Total Protein: 6.5 g/dL (ref 6.0–8.3)

## 2021-10-21 LAB — VITAMIN D 25 HYDROXY (VIT D DEFICIENCY, FRACTURES): VITD: 40.89 ng/mL (ref 30.00–100.00)

## 2021-10-21 LAB — URINALYSIS, ROUTINE W REFLEX MICROSCOPIC
Bilirubin Urine: NEGATIVE
Hgb urine dipstick: NEGATIVE
Ketones, ur: NEGATIVE
Leukocytes,Ua: NEGATIVE
Nitrite: NEGATIVE
RBC / HPF: NONE SEEN
Specific Gravity, Urine: 1.02 (ref 1.000–1.030)
Total Protein, Urine: NEGATIVE
Urine Glucose: 250 — AB
Urobilinogen, UA: 0.2 (ref 0.0–1.0)
pH: 5.5 (ref 5.0–8.0)

## 2021-10-21 LAB — BASIC METABOLIC PANEL
BUN: 15 mg/dL (ref 6–23)
CO2: 26 mEq/L (ref 19–32)
Calcium: 9.4 mg/dL (ref 8.4–10.5)
Chloride: 106 mEq/L (ref 96–112)
Creatinine, Ser: 1.17 mg/dL (ref 0.40–1.50)
GFR: 65.86 mL/min (ref >60.00)
Glucose, Bld: 158 mg/dL — ABNORMAL HIGH (ref 70–99)
Potassium: 4.3 mEq/L (ref 3.5–5.1)
Sodium: 141 mEq/L (ref 135–145)

## 2021-10-21 LAB — PSA: PSA: 1.29 ng/mL (ref 0.10–4.00)

## 2021-10-21 LAB — HEMOGLOBIN A1C: Hgb A1c MFr Bld: 7.3 % — ABNORMAL HIGH (ref 4.6–6.5)

## 2021-10-21 LAB — MICROALBUMIN / CREATININE URINE RATIO
Creatinine,U: 136 mg/dL
Microalb Creat Ratio: 0.5 mg/g (ref 0.0–30.0)
Microalb, Ur: <0.7 mg/dL (ref 0.0–1.9)

## 2021-10-21 LAB — TSH: TSH: 4.34 u[IU]/mL (ref 0.35–5.50)

## 2021-10-21 LAB — VITAMIN B12: Vitamin B-12: 163 pg/mL — ABNORMAL LOW (ref 211–911)

## 2021-10-22 ENCOUNTER — Encounter: Payer: Self-pay | Admitting: Internal Medicine

## 2021-10-22 ENCOUNTER — Other Ambulatory Visit: Payer: Self-pay

## 2021-10-22 ENCOUNTER — Ambulatory Visit (INDEPENDENT_AMBULATORY_CARE_PROVIDER_SITE_OTHER): Payer: Medicare HMO | Admitting: Internal Medicine

## 2021-10-22 VITALS — BP 118/76 | HR 70 | Temp 98.8°F | Ht 70.0 in | Wt 240.0 lb

## 2021-10-22 DIAGNOSIS — Z0001 Encounter for general adult medical examination with abnormal findings: Secondary | ICD-10-CM | POA: Insufficient documentation

## 2021-10-22 DIAGNOSIS — I10 Essential (primary) hypertension: Secondary | ICD-10-CM

## 2021-10-22 DIAGNOSIS — E119 Type 2 diabetes mellitus without complications: Secondary | ICD-10-CM

## 2021-10-22 DIAGNOSIS — E7800 Pure hypercholesterolemia, unspecified: Secondary | ICD-10-CM | POA: Diagnosis not present

## 2021-10-22 DIAGNOSIS — E538 Deficiency of other specified B group vitamins: Secondary | ICD-10-CM

## 2021-10-22 DIAGNOSIS — Z794 Long term (current) use of insulin: Secondary | ICD-10-CM | POA: Diagnosis not present

## 2021-10-22 DIAGNOSIS — E559 Vitamin D deficiency, unspecified: Secondary | ICD-10-CM | POA: Diagnosis not present

## 2021-10-22 MED ORDER — RYBELSUS 3 MG PO TABS: 3.0000 mg | ORAL_TABLET | Freq: Every day | ORAL | 3 refills | Status: DC

## 2021-10-22 NOTE — Assessment & Plan Note (Signed)
BP Readings from Last 3 Encounters:  10/22/21 118/76  10/10/21 140/80  09/19/21 118/62   Stable, pt to continue medical treatment norvasc, toprol

## 2021-10-22 NOTE — Assessment & Plan Note (Signed)
Lab Results  Component Value Date   VITAMINB12 163 (L) 10/21/2021   Low, to start oral replacement - b12 1000 mcg qd

## 2021-10-22 NOTE — Assessment & Plan Note (Signed)
Lab Results  Component Value Date   LDLCALC 71 10/21/2021   Mild uncontrolled, goal ldl < 70, pt to continue current tricor and crestor 5 as declines change for now

## 2021-10-22 NOTE — Progress Notes (Signed)
Patient ID: Keith [name], male   DOB: 1956/02/12, 66 y.o.   MRN: [id number]        Chief Complaint:: wellness exam and DM, HTN, low b12       HPI:  Keith [name] is a 66 y.o. male here for wellness exam; declines flu shot, shingrix, o/w up to date                        Also Pt denies chest pain, increased sob or doe, wheezing, orthopnea, PND, increased LE swelling, palpitations, dizziness or syncope.   Pt denies polydipsia, polyuria, or new focal neuro s/s.   Pt denies fever, wt loss, night sweats, loss of appetite, or other constitutional symptoms  not taking B12.  Is interested in working on wt loss with [facility] if possible  Wt Readings from Last 3 Encounters:  10/22/21 240 lb (108.9 kg)  10/10/21 236 lb 12.8 oz (107.4 kg)  09/19/21 233 lb (105.7 kg)   BP Readings from Last 3 Encounters:  10/22/21 118/76  10/10/21 140/80  09/19/21 118/62   Immunization History  Administered Date(s) Administered   Influenza Whole 11/08/2009   Influenza,inj,Quad PF,6+ Mos 05/12/2013, 08/31/2014, 05/06/2017, 06/24/2018, 05/19/2019   Influenza-Unspecified 07/19/2015, 06/24/2018   Pneumococcal Conjugate-13 10/26/2014   Pneumococcal Polysaccharide-23 05/12/2013   Td 09/15/1997, 11/02/2008   Tdap 05/19/2019   There are no preventive care reminders to display for this patient.     Past Medical History:  Diagnosis Date   Anxiety    Arthritis    Cervical spine pain    Chronic   Chronic HFimpEF (heart failure with improved ejection fraction) ([place])    a. 01/2010 Echo: EF 30%, diff HK; b. 08/2014 Echo: EF 50%, no rwma, GrI DD.   Diabetes mellitus    Diverticulosis    Fatty liver    GERD (gastroesophageal reflux disease)    Hereditary hemochromatosis    Compound heterozygote. Pt gets periodic phlebotomies   History of colonic polyps    History of NICM (nonischemic cardiomyopathy) w/ subsequent improvement in LV function(HCC)    a. Echo (5/11) showed EF 30% with diffuse hypokinesis, normal  wall thickness, mildly decreased RV systolic function. This may be a cardiomyopathy due to hemochromatosis versus tachy-mediated; b. 08/2014 Echo: EF 50%, no rwma, GrI DD.   Hyperlipidemia    Hypertension    Hypothyroidism    Low back pain    The pt is S/P spinal fusion surgeries   Multifocal atrial tachycardia ([place])    a. Holter monior in 5/11 showed frequent runs of symtomatic MAT with heart rate around 100-->prev on amio-->now on bb only.   Nonobstructive CAD (coronary artery disease)    a. 11/2008 MV: EF 50%, Inf HK, inferior scar and ischemia in the apical anterior septum and in the inferior wall; b. 11/2008 Cath: LM nl, LAD min irregs, LCX nl, OM1/2 nl, RCA nl.   Peptic ulcer disease    Thyroid nodule    Wandering atrial pacemaker    Past Surgical History:  Procedure Laterality Date   CORONARY STENT INTERVENTION [name] 09/06/2021   Procedure: CORONARY STENT INTERVENTION;  Surgeon: Belva Crome, MD;  Location: [facility];  Service: Cardiovascular;  Laterality: [name];   INGUINAL HERNIA REPAIR Right    LEFT HEART CATH AND CORONARY ANGIOGRAPHY [name] 09/06/2021   Procedure: LEFT HEART CATH AND CORONARY ANGIOGRAPHY;  Surgeon: Belva Crome, MD;  Location: [facility];  Service:  Cardiovascular;  Laterality: [name];   LUMBAR DISC SURGERY  09/15/1989   TONSILLECTOMY      reports that he quit smoking about 18 years ago. His smoking use included cigarettes. He has never used smokeless tobacco. He reports that he does not currently use alcohol. He reports that he does not currently use drugs. family history includes Alcohol abuse in his father; Colitis in his mother; Colon polyps in his mother; Diabetes in his mother, paternal grandfather, and paternal grandmother; Hypertension in his father; Multiple sclerosis in his mother and sister. Allergies  Allergen Reactions   Dicyclomine Nausea And Vomiting and Other (See Comments)    Lightheaded, dizziness, SOB   Doxycycline Nausea Only     Weak, fatigue, "sick on stomach"   Lipitor [Atorvastatin]     myalgias   Penicillins Other (See Comments)    Childhood allergy - unknown reaction   Reglan [Metoclopramide] Other (See Comments)    Unknown reaction   Zocor [Simvastatin] Other (See Comments)    Myalgia    Zoloft [Sertraline Hcl] Other (See Comments)    Unknown reaction    Current Outpatient Medications on File Prior to Visit  Medication Sig Dispense Refill   Alcohol Swabs (B-D SINGLE USE SWABS REGULAR) PADS Use as directed once daily E11.9 100 each 3   amLODipine (NORVASC) 5 MG tablet Take 1 tablet (5 mg total) by mouth daily. 90 tablet 3   aspirin 81 MG EC tablet Take 81 mg by mouth daily.     Blood Glucose Calibration (TRUE METRIX LEVEL 1) Low SOLN Use as directed once daily E11.9 1 each 3   Blood Glucose Monitoring Suppl (TRUE METRIX METER) w/Device KIT Use as directed once daily E11.9 1 kit 0   Cholecalciferol (VITAMIN D-3) 25 MCG (1000 UT) CAPS Take 2,000 Units by mouth daily.     clopidogrel (PLAVIX) 75 MG tablet Take 1 tablet (75 mg total) by mouth daily. 90 tablet 3   DROPLET PEN NEEDLES 32G X 4 MM MISC      fenofibrate (TRICOR) 145 MG tablet Take 1 tablet (145 mg total) by mouth daily. 90 tablet 3   glipiZIDE (GLUCOTROL XL) 10 MG 24 hr tablet Take 1 tablet (10 mg total) by mouth daily with breakfast. 90 tablet 3   glucosamine-chondroitin 500-400 MG tablet Take 1 tablet by mouth daily.     glucose blood (RELION TRUE METRIX TEST STRIPS) test strip Use as instructed once daily E11.9 100 each 12   levothyroxine (SYNTHROID) 25 MCG tablet TAKE 1 TABLET EVERY DAY (NEED MD APPOINTMENT) 90 tablet 3   metFORMIN (GLUCOPHAGE-XR) 500 MG 24 hr tablet Take 4 tablets (2,000 mg total) by mouth daily with breakfast. 360 tablet 3   metoprolol succinate (TOPROL-XL) 100 MG 24 hr tablet Take 1 tablet (100 mg total) by mouth 2 (two) times daily. Take with or immediately following a meal. 180 tablet 3   olmesartan (BENICAR) 40 MG tablet  Take 1 tablet (40 mg total) by mouth daily. 90 tablet 3   Omega-3 Fatty Acids (FISH OIL PO) Take 1 tablet by mouth daily.      pantoprazole (PROTONIX) 40 MG tablet Take 1 tablet (40 mg total) by mouth daily. 90 tablet 3   pioglitazone (ACTOS) 45 MG tablet Take 1 tablet (45 mg total) by mouth daily. 90 tablet 3   rosuvastatin (CRESTOR) 5 MG tablet Take 1 tablet (5 mg total) by mouth daily. 90 tablet 3   tadalafil (CIALIS) 20 MG tablet Take 1  tablet (20 mg total) by mouth daily as needed for erectile dysfunction. 30 tablet 11   TRUEplus Lancets 33G MISC Use as directed once daily E11.9 100 each 12   No current facility-administered medications on file prior to visit.        ROS:  All others reviewed and negative.  Objective        PE:  BP 118/76 (BP Location: Right Arm, Patient Position: Sitting, Cuff Size: Large)    Pulse 70    Temp 98.8 F (37.1 C) (Oral)    Ht '5\' 10"'  (1.778 m)    Wt 240 lb (108.9 kg)    SpO2 97%    BMI 34.44 kg/m                 Constitutional: Pt appears in NAD               HENT: Head: NCAT.                Right Ear: External ear normal.                 Left Ear: External ear normal.                Eyes: . Pupils are equal, round, and reactive to light. Conjunctivae and EOM are normal               Nose: without d/c or deformity               Neck: Neck supple. Gross normal ROM               Cardiovascular: Normal rate and regular rhythm.                 Pulmonary/Chest: Effort normal and breath sounds without rales or wheezing.                Abd:  Soft, NT, ND, + BS, no organomegaly               Neurological: Pt is alert. At baseline orientation, motor grossly intact               Skin: Skin is warm. No rashes, no other new lesions, LE edema - none               Psychiatric: Pt behavior is normal without agitation   Micro: none  Cardiac tracings I have personally interpreted today:  none  Pertinent Radiological findings (summarize): none   Lab Results   Component Value Date   WBC 7.3 10/21/2021   HGB 13.7 10/21/2021   HCT 41.0 10/21/2021   PLT 184.0 10/21/2021   GLUCOSE 158 (H) 10/21/2021   CHOL 120 10/21/2021   TRIG 81.0 10/21/2021   HDL 33.00 (L) 10/21/2021   LDLDIRECT 127.0 11/15/2019   LDLCALC 71 10/21/2021   ALT 15 10/21/2021   AST 16 10/21/2021   NA 141 10/21/2021   K 4.3 10/21/2021   CL 106 10/21/2021   CREATININE 1.17 10/21/2021   BUN 15 10/21/2021   CO2 26 10/21/2021   TSH 4.34 10/21/2021   PSA 1.29 10/21/2021   INR 1.1 (H) 07/09/2016   HGBA1C 7.3 (H) 10/21/2021   MICROALBUR <0.7 10/21/2021   Assessment/Plan:  Keith [name] is a 66 y.o. White or Caucasian [1] male with  has a past medical history of Anxiety, Arthritis, Cervical spine pain, Chronic HFimpEF (heart failure with improved ejection fraction) ([place]), Diabetes mellitus, Diverticulosis, Fatty  liver, GERD (gastroesophageal reflux disease), Hereditary hemochromatosis, History of colonic polyps, History of NICM (nonischemic cardiomyopathy) w/ subsequent improvement in LV function(HCC), Hyperlipidemia, Hypertension, Hypothyroidism, Low back pain, Multifocal atrial tachycardia (HCC), Nonobstructive CAD (coronary artery disease), Peptic ulcer disease, Thyroid nodule, and Wandering atrial pacemaker.  Encounter for well adult exam with abnormal findings Age and sex appropriate education and counseling updated with regular exercise and diet Referrals for preventative services - none needed Immunizations addressed - declines flu shot, shingrix Smoking counseling  - none needed Evidence for depression or other mood disorder - none significant Most recent labs reviewed. I have personally reviewed and have noted: 1) the patient's medical and social history 2) The patient's current medications and supplements 3) The patient's height, weight, and BMI have been recorded in the chart   Essential hypertension BP Readings from Last 3 Encounters:  10/22/21 118/76   10/10/21 140/80  09/19/21 118/62   Stable, pt to continue medical treatment norvasc, toprol   Hyperlipidemia Lab Results  Component Value Date   LDLCALC 71 10/21/2021   Mild uncontrolled, goal ldl < 70, pt to continue current tricor and crestor 5 as declines change for now   Insulin dependent type 2 diabetes mellitus, controlled ([place]) Lab Results  Component Value Date   HGBA1C 7.3 (H) 10/21/2021   Mild uncontrolled pt to continue current medical treatment glucotrol, metformin, actos - but try to change glucotrol to rybelsus to assist with wt control as well if ok with insurance   B12 deficiency Lab Results  Component Value Date   VITAMINB12 163 (L) 10/21/2021   Low, to start oral replacement - b12 1000 mcg qd  Followup: Return in about 6 months (around 04/21/2022).  Cathlean Cower, MD 10/22/2021 12:48 PM [facility]

## 2021-10-22 NOTE — Assessment & Plan Note (Signed)
Age and sex appropriate education and counseling updated with regular exercise and diet Referrals for preventative services - none needed Immunizations addressed - declines flu shot, shingrix Smoking counseling  - none needed Evidence for depression or other mood disorder - none significant Most recent labs reviewed. I have personally reviewed and have noted: 1) the patient's medical and social history 2) The patient's current medications and supplements 3) The patient's height, weight, and BMI have been recorded in the chart

## 2021-10-22 NOTE — Patient Instructions (Signed)
Please take all new medication as prescribed - the rybelsus 3 mg per day  Ok to stop the glipizide if you are able to start the rybelsus  Please also start OTC B12 1000 mcg per day for 6 months  Please continue to monitor Your BP at home as you do  Please continue all other medications as before, and refills have been done if requested.  Please have the pharmacy call with any other refills you may need.  Please continue your efforts at being more active, low cholesterol diet, and weight control.  You are otherwise up to date with prevention measures today.  Please keep your appointments with your specialists as you may have planned  Please make an Appointment to return in 6 months, or sooner if needed, also with Lab Appointment for testing done 3-5 days before at the [facility] (so this is for TWO appointments - please see the scheduling desk as you leave)  Due to the ongoing Covid 19 pandemic, our lab now requires an appointment for any labs done at our office.  If you need labs done and do not have an appointment, please call our office ahead of time to schedule before presenting to the lab for your testing.

## 2021-10-22 NOTE — Assessment & Plan Note (Signed)
Lab Results  Component Value Date   HGBA1C 7.3 (H) 10/21/2021   Mild uncontrolled pt to continue current medical treatment glucotrol, metformin, actos - but try to change glucotrol to rybelsus to assist with wt control as well if ok with insurance

## 2021-10-30 ENCOUNTER — Telehealth: Payer: Self-pay | Admitting: Physician Assistant

## 2021-10-30 MED ORDER — AMLODIPINE BESYLATE 10 MG PO TABS: 10.0000 mg | ORAL_TABLET | Freq: Every day | ORAL | 3 refills | Status: DC

## 2021-10-30 NOTE — Telephone Encounter (Signed)
Called patient, advised of MD response.  RX called into pharmacy.   Patient verbalized understanding.

## 2021-10-30 NOTE — Telephone Encounter (Signed)
New Message:    Patient said he saw Keith [name] on 10-13-21. He said she told him that if his blood pressure continue to be high, she would change his medicine. He says his pressure is still high.     Pt c/o BP issue: STAT if pt c/o blurred vision, one-sided weakness or slurred speech  1. What are your last 5 BP readings? He did not have the exact readings, he said it have  stayed over 130  2. Are you having any other symptoms (ex. Dizziness, headache, blurred vision, passed out)? no  3. What is your BP issue? Blood pressure is running high

## 2021-10-30 NOTE — Telephone Encounter (Signed)
Called patient, he states that he seen Doreene Adas, PA-C and she recommended that if his blood pressure continued to stay above 005 systolic to let her know and we could make some changes.   He states that he does have a high blood pressure- it is normally in the 130s, or mid 140's. This morning it was 140/80. He denied medication changes, list is up to date- patient is on olmesartan, amlodipine, metoprolol. Patient denies diet changes, no chest pains, shortness of breath or swelling. Patient denies any other symptoms (does mention having symptoms of heart burn at times) and states he just feels "off".   Patient advised I would route to PA, and primary MD to review.  Patient had appointment with [facility] 11/08/2021, but he states this is to review cholesterol options.   Will call patient back with recommendations.

## 2021-11-06 ENCOUNTER — Telehealth: Payer: Self-pay | Admitting: Internal Medicine

## 2021-11-06 NOTE — Telephone Encounter (Signed)
Pt states [facility] informed him rx for Semaglutide (RYBELSUS) 3 MG TABS was not legible and could not be filled, pt requesting a new rx to be sent   [place], [place]

## 2021-11-08 ENCOUNTER — Telehealth: Payer: Self-pay | Admitting: Pharmacist

## 2021-11-08 ENCOUNTER — Ambulatory Visit (INDEPENDENT_AMBULATORY_CARE_PROVIDER_SITE_OTHER): Payer: Medicare HMO | Admitting: Pharmacist

## 2021-11-08 ENCOUNTER — Encounter: Payer: Self-pay | Admitting: Pharmacist

## 2021-11-08 ENCOUNTER — Other Ambulatory Visit: Payer: Self-pay

## 2021-11-08 DIAGNOSIS — E7800 Pure hypercholesterolemia, unspecified: Secondary | ICD-10-CM

## 2021-11-08 DIAGNOSIS — T466X5A Adverse effect of antihyperlipidemic and antiarteriosclerotic drugs, initial encounter: Secondary | ICD-10-CM

## 2021-11-08 DIAGNOSIS — I2510 Atherosclerotic heart disease of native coronary artery without angina pectoris: Secondary | ICD-10-CM

## 2021-11-08 DIAGNOSIS — G720 Drug-induced myopathy: Secondary | ICD-10-CM | POA: Diagnosis not present

## 2021-11-08 DIAGNOSIS — E785 Hyperlipidemia, unspecified: Secondary | ICD-10-CM

## 2021-11-08 MED ORDER — GLIPIZIDE ER 10 MG PO TB24: 10.0000 mg | ORAL_TABLET | Freq: Every day | ORAL | 3 refills | Status: DC

## 2021-11-08 NOTE — Patient Instructions (Addendum)
It was nice meeting you today!  We would like your LDL (bad cholesterol) to be less than 55  Please continue your rosuvastatin 5mg  once a day  We will start a new medication called Repatha which you will inject once every 2 weeks  We will complete the prior authorization for you and contact you when it is approved  Once you start the medication, we will recheck your cholesterol in 2-3 months  Please call with any questions!  Karren Cobble, PharmD, BCACP, [place], [place], [place], [place], [address] Phone: [phone], Fax: [phone]

## 2021-11-08 NOTE — Telephone Encounter (Signed)
Pt returning call, advised pt of providers advise and recommendation, pt expressed understanding

## 2021-11-08 NOTE — Telephone Encounter (Signed)
Please complete prior authorization for:  Name of medication, dose, and frequency repatha 140mg  sq q 14 days  Lab Orders Requested? yes  Which labs? Lipid panel  Estimated date for labs to be scheduled 2-3 months  Does patient need activated copay card? No, but patient requests Fatima Sanger assistance

## 2021-11-08 NOTE — Telephone Encounter (Signed)
Unable to reach patient to notify
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
## 2021-11-08 NOTE — Telephone Encounter (Signed)
Ok to let pt know-   Rybelsus not coveredy by his insurance, so it seems we need to stay with the glipizide for now    thanks

## 2021-11-11 MED ORDER — REPATHA SURECLICK 140 MG/ML ~~LOC~~ SOAJ: 140.0000 mg | SUBCUTANEOUS | 11 refills | Status: DC

## 2021-11-11 NOTE — Addendum Note (Signed)
Addended by: Allean Found on: 11/11/2021 10:48 AM   Modules accepted: Orders

## 2021-11-11 NOTE — Addendum Note (Signed)
Addended by: Allean Found on: 11/11/2021 07:44 AM   Modules accepted: Orders

## 2021-11-11 NOTE — Telephone Encounter (Signed)
Called and spoke to pt and stated that they were approved for repatha, rx sent, pt instructed to complete fasting labs post post 4th dose. Pt voiced understanding.

## 2021-11-11 NOTE — Telephone Encounter (Signed)
Keith [name] (Keith [name]) - [id number] [name] 140MG /ML auto-injectors Status: PA Request Created: February 27th, 2023 Sent: February 27th, 2023  LIPID PANEL ORDERED AND RELEASED  [employer] GRANT: PATIENT Keith [name]   STATUS  Active   START DATE 10/12/2021   END DATE 10/11/2022   ASSISTANCE TYPE Co-pay   PAID $0.00   PENDING $0.00   BALANCE $2500.00 Pharmacy Card CARD NO. [id number]   CARD STATUS Active   BIN [id number]   PCN [id number]   PC GROUP [id number]   HELP DESK [phone]   PROVIDER [id number]   PROCESSOR PDMI

## 2022-01-09 DIAGNOSIS — R3912 Poor urinary stream: Secondary | ICD-10-CM | POA: Diagnosis not present

## 2022-01-09 DIAGNOSIS — N5201 Erectile dysfunction due to arterial insufficiency: Secondary | ICD-10-CM | POA: Diagnosis not present

## 2022-01-09 DIAGNOSIS — N401 Enlarged prostate with lower urinary tract symptoms: Secondary | ICD-10-CM | POA: Diagnosis not present

## 2022-02-13 ENCOUNTER — Ambulatory Visit (HOSPITAL_BASED_OUTPATIENT_CLINIC_OR_DEPARTMENT_OTHER): Payer: Medicare HMO | Admitting: Family

## 2022-02-13 ENCOUNTER — Telehealth: Payer: Self-pay | Admitting: Cardiology

## 2022-02-13 ENCOUNTER — Encounter (HOSPITAL_BASED_OUTPATIENT_CLINIC_OR_DEPARTMENT_OTHER): Payer: Self-pay | Admitting: Family

## 2022-02-13 VITALS — BP 110/70 | HR 85 | Ht 70.0 in | Wt 244.5 lb

## 2022-02-13 DIAGNOSIS — R200 Anesthesia of skin: Secondary | ICD-10-CM

## 2022-02-13 DIAGNOSIS — E118 Type 2 diabetes mellitus with unspecified complications: Secondary | ICD-10-CM | POA: Diagnosis not present

## 2022-02-13 DIAGNOSIS — I739 Peripheral vascular disease, unspecified: Secondary | ICD-10-CM | POA: Diagnosis not present

## 2022-02-13 DIAGNOSIS — I25118 Atherosclerotic heart disease of native coronary artery with other forms of angina pectoris: Secondary | ICD-10-CM

## 2022-02-13 DIAGNOSIS — I10 Essential (primary) hypertension: Secondary | ICD-10-CM | POA: Diagnosis not present

## 2022-02-13 DIAGNOSIS — E785 Hyperlipidemia, unspecified: Secondary | ICD-10-CM | POA: Diagnosis not present

## 2022-02-13 DIAGNOSIS — R5383 Other fatigue: Secondary | ICD-10-CM

## 2022-02-13 MED ORDER — AMLODIPINE BESYLATE 10 MG PO TABS: 5.0000 mg | ORAL_TABLET | Freq: Every day | ORAL | 3 refills | Status: DC

## 2022-02-13 NOTE — Telephone Encounter (Signed)
Pt c/o of Chest Pain: STAT if CP now or developed within 24 hours  1. Are you having CP right now? Feels like aa knot in his chest  2. Are you experiencing any other symptoms (ex. SOB, nausea, vomiting, sweating)?  Tingling arms, legs and feet   3. How long have you been experiencing CP? Last 2 weeks getting worse  4. Is your CP continuous or coming and going? Comes and goes  5. Have you taken Nitroglycerin? no ?

## 2022-02-13 NOTE — Telephone Encounter (Signed)
Spoke to patient. Patient states he he gets  some chest pressure or feeling like a knot in his chest .-  He describe it as a pressure  upper stomach ,lower chest.   At times numbness /tingling  in arms legs and feet -  these symptoms can be together or separate  with chest discomfort.   Patient very vague with symptoms other than the issue has been increasing over the last few weeks.  He states he feels weak  and exhaust all the time.  Patient  did have a blood pressure reading for today - He states yesterday  '@157'$  / 88.  Appointment  schedule for today. Patient is in agreement for appointment with  the [place] office.  Time and Direction given . Patient verbalized understanding.
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
## 2022-02-13 NOTE — Patient Instructions (Addendum)
Medication Instructions:  Your physician has recommended you make the following change in your medication:   REDUCE Amlodipine to half tablet ('5mg'$ ) daily  *If you need a refill on your cardiac medications before your next appointment, please call your pharmacy*  Lab Work: Your physician recommends that you return for lab work today: thyroid panel, CBC, magnesium  If you have labs (blood work) drawn today and your tests are completely normal, you will receive your results only by: [place] (if you have MyChart) OR A paper copy in the mail If you have any lab test that is abnormal or we need to change your treatment, we will call you to review the results.  Testing/Procedures: Your physician has requested that you have an ankle brachial index (ABI). During this test an ultrasound and blood pressure cuff are used to evaluate the arteries that supply the arms and legs with blood. Allow thirty minutes for this exam. There are no restrictions or special instructions.   Follow-Up: At [facility], you and your health needs are our priority.  As part of our continuing mission to provide you with exceptional heart care, we have created designated Provider Care Teams.  These Care Teams include your primary Cardiologist (physician) and Advanced Practice Providers (APPs -  Physician Assistants and Nurse Practitioners) who all work together to provide you with the care you need, when you need it.  We recommend signing up for the patient portal called "MyChart".  Sign up information is provided on this After Visit Summary.  MyChart is used to connect with patients for Virtual Visits (Telemedicine).  Patients are able to view lab/test results, encounter notes, upcoming appointments, etc.  Non-urgent messages can be sent to your provider as well.   To learn more about what you can do with MyChart, go to [email or web address].    Your next appointment:   4-6 week(s)  The format for your next  appointment:   In Person  Provider:   Dr. Stanford Breed or Loel Dubonnet, NP or Doreene Adas, [place]  Other Instructions  Heart Healthy Diet Recommendations: A low-salt diet is recommended. Meats should be grilled, baked, or boiled. Avoid fried foods. Focus on lean protein sources like fish or chicken with vegetables and fruits. The American Heart Association is a Microbiologist!  American Heart Association Diet and Lifeystyle Recommendations   Exercise recommendations: The American Heart Association recommends 150 minutes of moderate intensity exercise weekly. Try 30 minutes of moderate intensity exercise 4-5 times per week. This could include walking, jogging, or swimming.   Important Information About Sugar

## 2022-02-14 ENCOUNTER — Telehealth (HOSPITAL_BASED_OUTPATIENT_CLINIC_OR_DEPARTMENT_OTHER): Payer: Self-pay

## 2022-02-14 LAB — CBC
Hematocrit: 40.3 % (ref 37.5–51.0)
Hemoglobin: 14.4 g/dL (ref 13.0–17.7)
MCH: 32.4 pg (ref 26.6–33.0)
MCHC: 35.7 g/dL (ref 31.5–35.7)
MCV: 91 fL (ref 79–97)
Platelets: 264 10*3/uL (ref 150–450)
RBC: 4.45 x10E6/uL (ref 4.14–5.80)
RDW: 13.2 % (ref 11.6–15.4)
WBC: 10.1 10*3/uL (ref 3.4–10.8)

## 2022-02-14 LAB — THYROID PANEL WITH TSH
Free Thyroxine Index: 3.4 (ref 1.2–4.9)
T3 Uptake Ratio: 32 % (ref 24–39)
T4, Total: 10.5 ug/dL (ref 4.5–12.0)
TSH: 4.29 u[IU]/mL (ref 0.450–4.500)

## 2022-02-14 LAB — MAGNESIUM: Magnesium: 2.1 mg/dL (ref 1.6–2.3)

## 2022-02-14 NOTE — Telephone Encounter (Addendum)
Call attempt #1, no answer. Unable to leave message.      ----- Message from Loel Dubonnet, NP sent at 02/14/2022  7:57 AM EDT ----- CBC with no evidence of anemia nor infection.  Normal thyroid function.  Great result.  No changes.

## 2022-02-18 ENCOUNTER — Telehealth (HOSPITAL_BASED_OUTPATIENT_CLINIC_OR_DEPARTMENT_OTHER): Payer: Self-pay

## 2022-02-18 ENCOUNTER — Encounter (HOSPITAL_BASED_OUTPATIENT_CLINIC_OR_DEPARTMENT_OTHER): Payer: Self-pay

## 2022-02-18 NOTE — Telephone Encounter (Addendum)
Call attempt, no voicemail set up, results mailed to patient.    ----- Message from Loel Dubonnet, NP sent at 02/14/2022  7:57 AM EDT ----- CBC with no evidence of anemia nor infection.  Normal thyroid function.  Great result.  No changes.

## 2022-03-07 ENCOUNTER — Ambulatory Visit (HOSPITAL_COMMUNITY)
Admission: RE | Admit: 2022-03-07 | Discharge: 2022-03-07 | Disposition: A | Discharge status: Home / Self Care | Payer: Medicare HMO | Source: Ambulatory Visit | Attending: Internal Medicine | Admitting: Internal Medicine

## 2022-03-07 DIAGNOSIS — R200 Anesthesia of skin: Secondary | ICD-10-CM | POA: Diagnosis not present

## 2022-03-07 DIAGNOSIS — I739 Peripheral vascular disease, unspecified: Secondary | ICD-10-CM | POA: Insufficient documentation

## 2022-03-10 ENCOUNTER — Encounter (HOSPITAL_BASED_OUTPATIENT_CLINIC_OR_DEPARTMENT_OTHER): Payer: Self-pay

## 2022-03-10 NOTE — Telephone Encounter (Signed)
Opened in error - please disregard

## 2022-03-12 ENCOUNTER — Telehealth (HOSPITAL_BASED_OUTPATIENT_CLINIC_OR_DEPARTMENT_OTHER): Payer: Self-pay

## 2022-03-12 NOTE — Telephone Encounter (Addendum)
Call attempt, no answer, and no voicemail box set up. Results mailed to patient     ----- Message from Loel Dubonnet, NP sent at 03/10/2022 11:26 AM EDT ----- No evidence of peripheral arterial disease. Good result! Recommend follow up with PCP and orthopedics regarding leg pain.

## 2022-03-14 ENCOUNTER — Ambulatory Visit (HOSPITAL_BASED_OUTPATIENT_CLINIC_OR_DEPARTMENT_OTHER): Payer: Medicare HMO | Admitting: Family

## 2022-03-14 ENCOUNTER — Encounter (HOSPITAL_BASED_OUTPATIENT_CLINIC_OR_DEPARTMENT_OTHER): Payer: Self-pay | Admitting: Family

## 2022-03-14 VITALS — BP 124/88 | HR 74 | Ht 70.0 in | Wt 247.0 lb

## 2022-03-14 DIAGNOSIS — K219 Gastro-esophageal reflux disease without esophagitis: Secondary | ICD-10-CM | POA: Diagnosis not present

## 2022-03-14 DIAGNOSIS — E039 Hypothyroidism, unspecified: Secondary | ICD-10-CM | POA: Diagnosis not present

## 2022-03-14 DIAGNOSIS — I10 Essential (primary) hypertension: Secondary | ICD-10-CM

## 2022-03-14 DIAGNOSIS — E785 Hyperlipidemia, unspecified: Secondary | ICD-10-CM

## 2022-03-14 DIAGNOSIS — I25118 Atherosclerotic heart disease of native coronary artery with other forms of angina pectoris: Secondary | ICD-10-CM | POA: Diagnosis not present

## 2022-03-14 MED ORDER — AMLODIPINE BESYLATE 5 MG PO TABS: 5.0000 mg | ORAL_TABLET | Freq: Every day | ORAL | 3 refills | Status: DC
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
## 2022-03-14 NOTE — Patient Instructions (Addendum)
Medication Instructions:  Continue your current medications.   We have sent Amlodipine '5mg'$  tablets to your pharmacy - that way you don't have to split your '10mg'$  tablets in half.   *If you need a refill on your cardiac medications before your next appointment, please call your pharmacy*   Lab Work: None ordered today.   Testing/Procedures: None ordered today.   Follow-Up: At [facility], you and your health needs are our priority.  As part of our continuing mission to provide you with exceptional heart care, we have created designated Provider Care Teams.  These Care Teams include your primary Cardiologist (physician) and Advanced Practice Providers (APPs -  Physician Assistants and Nurse Practitioners) who all work together to provide you with the care you need, when you need it.  We recommend signing up for the patient portal called "MyChart".  Sign up information is provided on this After Visit Summary.  MyChart is used to connect with patients for Virtual Visits (Telemedicine).  Patients are able to view lab/test results, encounter notes, upcoming appointments, etc.  Non-urgent messages can be sent to your provider as well.   To learn more about what you can do with MyChart, go to [email or web address].    Your next appointment:   6 month(s)  The format for your next appointment:   In Person  Provider:   Kirk Ruths, MD or Loel Dubonnet, NP     Other Instructions  Heart Healthy Diet Recommendations: A low-salt diet is recommended. Meats should be grilled, baked, or boiled. Avoid fried foods. Focus on lean protein sources like fish or chicken with vegetables and fruits. The American Heart Association is a Microbiologist!  American Heart Association Diet and Lifeystyle Recommendations   Exercise recommendations: The American Heart Association recommends 150 minutes of moderate intensity exercise weekly. Try 30 minutes of moderate intensity exercise 4-5 times per  week. This could include walking, jogging, or swimming.   Important Information About Sugar

## 2022-04-03 ENCOUNTER — Ambulatory Visit (INDEPENDENT_AMBULATORY_CARE_PROVIDER_SITE_OTHER): Payer: Medicare HMO

## 2022-04-03 DIAGNOSIS — Z0000 Encounter for general adult medical examination without abnormal findings: Secondary | ICD-10-CM | POA: Diagnosis not present

## 2022-04-03 NOTE — Progress Notes (Signed)
I connected with Keith [name] today by telephone and verified that I am speaking with the correct person using two identifiers. Location patient: home Location provider: work Persons participating in the virtual visit: patient, provider.   I discussed the limitations, risks, security and privacy concerns of performing an evaluation and management service by telephone and the availability of in person appointments. I also discussed with the patient that there may be a patient responsible charge related to this service. The patient expressed understanding and verbally consented to this telephonic visit.    Interactive audio and video telecommunications were attempted between this provider and patient, however failed, due to patient having technical difficulties OR patient did not have access to video capability.  We continued and completed visit with audio only.  Some vital signs may be absent or patient reported.   Time Spent with patient on telephone encounter: 30 minutes  Subjective:   Keith [name] is a 66 y.o. male who presents for Medicare Annual/Subsequent preventive examination.  Review of Systems     Cardiac Risk Factors include: advanced age (>44mn, >>31women);diabetes mellitus;dyslipidemia;hypertension;male gender;obesity (BMI >30kg/m2);sedentary lifestyle     Objective:    There were no vitals filed for this visit. There is no height or weight on file to calculate BMI.     04/03/2022   11:52 AM 09/05/2021   10:15 PM 05/22/2020    8:44 AM 11/11/2017   10:08 AM  Advanced Directives  Does Patient Have a Medical Advance Directive? No No No No  Would patient like information on creating a medical advance directive? No - Patient declined  No - Patient declined No - Patient declined    Current Medications (verified) Outpatient Encounter Medications as of 04/03/2022  Medication Sig   Alcohol Swabs (B-D SINGLE USE SWABS REGULAR) PADS Use as directed once daily E11.9    amLODipine (NORVASC) 5 MG tablet Take 1 tablet (5 mg total) by mouth daily.   aspirin 81 MG EC tablet Take 81 mg by mouth daily.   Blood Glucose Calibration (TRUE METRIX LEVEL 1) Low SOLN Use as directed once daily E11.9   Blood Glucose Monitoring Suppl (TRUE METRIX METER) w/Device KIT Use as directed once daily E11.9   Cholecalciferol (VITAMIN D-3) 25 MCG (1000 UT) CAPS Take 2,000 Units by mouth daily.   clopidogrel (PLAVIX) 75 MG tablet Take 1 tablet (75 mg total) by mouth daily.   DROPLET PEN NEEDLES 32G X 4 MM MISC    Evolocumab (REPATHA SURECLICK) 1253MG/ML SOAJ Inject 140 mg into the skin every 14 (fourteen) days.   fenofibrate (TRICOR) 145 MG tablet Take 1 tablet (145 mg total) by mouth daily.   glipiZIDE (GLUCOTROL XL) 10 MG 24 hr tablet Take 1 tablet (10 mg total) by mouth daily with breakfast.   glucosamine-chondroitin 500-400 MG tablet Take 1 tablet by mouth daily.   glucose blood (RELION TRUE METRIX TEST STRIPS) test strip Use as instructed once daily E11.9   levothyroxine (SYNTHROID) 25 MCG tablet TAKE 1 TABLET EVERY DAY (NEED MD APPOINTMENT)   metFORMIN (GLUCOPHAGE-XR) 500 MG 24 hr tablet Take 4 tablets (2,000 mg total) by mouth daily with breakfast.   metoprolol succinate (TOPROL-XL) 100 MG 24 hr tablet Take 1 tablet (100 mg total) by mouth 2 (two) times daily. Take with or immediately following a meal.   olmesartan (BENICAR) 40 MG tablet Take 1 tablet (40 mg total) by mouth daily.   Omega-3 Fatty Acids (FISH OIL PO) Take 1 tablet by mouth  daily.    pantoprazole (PROTONIX) 40 MG tablet Take 1 tablet (40 mg total) by mouth daily.   pioglitazone (ACTOS) 45 MG tablet Take 1 tablet (45 mg total) by mouth daily.   rosuvastatin (CRESTOR) 5 MG tablet Take 1 tablet (5 mg total) by mouth daily.   tadalafil (CIALIS) 20 MG tablet Take 1 tablet (20 mg total) by mouth daily as needed for erectile dysfunction.   TRUEplus Lancets 33G MISC Use as directed once daily E11.9   No  facility-administered encounter medications on file as of 04/03/2022.    Allergies (verified) Dicyclomine, Doxycycline, Lipitor [atorvastatin], Penicillins, Reglan [metoclopramide], Zocor [simvastatin], and Zoloft [sertraline hcl]   History: Past Medical History:  Diagnosis Date   Anxiety    Arthritis    Cervical spine pain    Chronic   Chronic HFimpEF (heart failure with improved ejection fraction) ([place])    a. 01/2010 Echo: EF 30%, diff HK; b. 08/2014 Echo: EF 50%, no rwma, GrI DD.   Diabetes mellitus    Diverticulosis    Fatty liver    GERD (gastroesophageal reflux disease)    Hereditary hemochromatosis    Compound heterozygote. Pt gets periodic phlebotomies   History of colonic polyps    History of NICM (nonischemic cardiomyopathy) w/ subsequent improvement in LV function(HCC)    a. Echo (5/11) showed EF 30% with diffuse hypokinesis, normal wall thickness, mildly decreased RV systolic function. This may be a cardiomyopathy due to hemochromatosis versus tachy-mediated; b. 08/2014 Echo: EF 50%, no rwma, GrI DD.   Hyperlipidemia    Hypertension    Hypothyroidism    Low back pain    The pt is S/P spinal fusion surgeries   Multifocal atrial tachycardia ([place])    a. Holter monior in 5/11 showed frequent runs of symtomatic MAT with heart rate around 100-->prev on amio-->now on bb only.   Nonobstructive CAD (coronary artery disease)    a. 11/2008 MV: EF 50%, Inf HK, inferior scar and ischemia in the apical anterior septum and in the inferior wall; b. 11/2008 Cath: LM nl, LAD min irregs, LCX nl, OM1/2 nl, RCA nl.   Peptic ulcer disease    Thyroid nodule    Wandering atrial pacemaker    Past Surgical History:  Procedure Laterality Date   CORONARY STENT INTERVENTION [name] 09/06/2021   Procedure: CORONARY STENT INTERVENTION;  Surgeon: Belva Crome, MD;  Location: [facility];  Service: Cardiovascular;  Laterality: [name];   INGUINAL HERNIA REPAIR Right    LEFT HEART CATH AND CORONARY  ANGIOGRAPHY [name] 09/06/2021   Procedure: LEFT HEART CATH AND CORONARY ANGIOGRAPHY;  Surgeon: Belva Crome, MD;  Location: [facility];  Service: Cardiovascular;  Laterality: [name];   LUMBAR DISC SURGERY  09/15/1989   TONSILLECTOMY     Family History  Problem Relation Age of Onset   Colon polyps Mother    Multiple sclerosis Mother    Diabetes Mother    Colitis Mother    Hypertension Father    Alcohol abuse Father    Multiple sclerosis Sister    Diabetes Paternal Grandmother    Diabetes Paternal Grandfather    Colon cancer Neg Hx    Social History   Socioeconomic History   Marital status: Married    Spouse name: Not on file   Number of children: 3   Years of education: Not on file   Highest education level: Not on file  Occupational History   Occupation: Disabled    Employer:  UNEMPLOYED  Tobacco Use   Smoking status: Former    Types: Cigarettes    Quit date: 09/16/2003    Years since quitting: 18.5   Smokeless tobacco: Never  Vaping Use   Vaping Use: Never used  Substance and Sexual Activity   Alcohol use: Not Currently    Comment: social - 1 drink every few months.   Drug use: Not Currently    Comment: Quit in 1980   Sexual activity: Yes  Other Topics Concern   Not on file  Social History Narrative   Lives in [place] by himself.  Does not routinely exercise.   Social Determinants of Health   Financial Resource Strain: Low Risk  (04/03/2022)   Overall Financial Resource Strain (CARDIA)    Difficulty of Paying Living Expenses: Not hard at all  Food Insecurity: No Food Insecurity (04/03/2022)   Hunger Vital Sign    Worried About Running Out of Food in the Last Year: Never true    Ran Out of Food in the Last Year: Never true  Transportation Needs: No Transportation Needs (04/03/2022)   PRAPARE - Hydrologist (Medical): No    Lack of Transportation (Non-Medical): No  Physical Activity: Inactive (04/03/2022)   Exercise Vital Sign     Days of Exercise per Week: 0 days    Minutes of Exercise per Session: 0 min  Stress: No Stress Concern Present (04/03/2022)   [place]    Feeling of Stress : Not at all  Social Connections: Unknown (04/03/2022)   Social Connection and Isolation Panel [NHANES]    Frequency of Communication with Friends and Family: More than three times a week    Frequency of Social Gatherings with Friends and Family: More than three times a week    Attends Religious Services: 1 to 4 times per year    Active Member of [employer] or Organizations: No    Attends Music therapist: 1 to 4 times per year    Marital Status: Patient refused    Tobacco Counseling Counseling given: Not Answered   Clinical Intake:  Pre-visit preparation completed: Yes  Pain : 0-10 Pain Type: Chronic pain Pain Location: Back Pain Orientation: Lower Pain Radiating Towards: Bilateral legs Pain Descriptors / Indicators: Discomfort, Aching, Dull, Throbbing, Sharp Pain Onset: More than a month ago Pain Frequency: Constant Pain Relieving Factors: None Effect of Pain on Daily Activities: Pain can diminish job performance, lower motivation to exercise and prevent you from completing daily tasks.  Pain produces disability and affects the quality of life.  Pain Relieving Factors: None  BMI - recorded: 35.44 Nutritional Status: BMI > 30  Obese Nutritional Risks: None Diabetes: Yes CBG done?: No Did pt. bring in CBG monitor from home?: No  How often do you need to have someone help you when you read instructions, pamphlets, or other written materials from your doctor or pharmacy?: 1 - Never What is the last grade level you completed in school?: HSG  Diabetic? yes  Interpreter Needed?: No  Information entered by :: Lisette Abu, LPN.   Activities of Daily Living    04/03/2022   11:52 AM  In your present state of health, do you have any  difficulty performing the following activities:  Hearing? 0  Vision? 0  Difficulty concentrating or making decisions? 1  Walking or climbing stairs? 0  Dressing or bathing? 0  Doing errands, shopping? 0  Preparing Food  and eating ? N  Using the Toilet? N  In the past six months, have you accidently leaked urine? N  Do you have problems with loss of bowel control? N  Managing your Medications? N  Managing your Finances? N  Housekeeping or managing your Housekeeping? N    Patient Care Team: Biagio Borg, MD as PCP - General Stanford Breed Denice Bors, MD as PCP - Cardiology (Cardiology)  Indicate any recent Medical Services you may have received from other than [facility] providers in the past year (date may be approximate).     Assessment:   This is a routine wellness examination for [place].  Hearing/Vision screen Hearing Screening - Comments:: Patient denied any hearing difficulty.   No hearing aids.  Vision Screening - Comments:: Patient does wear corrective lenses/contacts.  Eye exam done by: [facility]   Dietary issues and exercise activities discussed: Current Exercise Habits: The patient does not participate in regular exercise at present, Exercise limited by: neurologic condition(s);orthopedic condition(s)   Goals Addressed             This Visit's Progress    To maintain my health.        Depression Screen    04/03/2022   11:38 AM 10/22/2021    9:46 AM 10/22/2021    9:03 AM 10/12/2020    9:22 AM 05/22/2020    8:43 AM 11/17/2019    9:29 AM 05/19/2019    9:24 AM  PHQ 2/9 Scores  PHQ - 2 Score 0 0 0 0 0 0 0    Fall Risk    04/03/2022   11:36 AM 10/22/2021    9:46 AM 10/22/2021    9:03 AM 10/12/2020    9:22 AM 05/22/2020    8:44 AM  Fall Risk   Falls in the past year? 0 0 0 0 0  Number falls in past yr: 0 0 0  0  Injury with Fall? 0 0 0  0  Risk for fall due to : No Fall Risks    No Fall Risks  Follow up Falls evaluation completed    Falls evaluation  completed;Education provided    FALL RISK PREVENTION PERTAINING TO THE HOME:  Any stairs in or around the home? No  If so, are there any without handrails? No  Home free of loose throw rugs in walkways, pet beds, electrical cords, etc? Yes  Adequate lighting in your home to reduce risk of falls? Yes   ASSISTIVE DEVICES UTILIZED TO PREVENT FALLS:  Life alert? No  Use of a cane, walker or w/c? No  Grab bars in the bathroom? No  Shower chair or bench in shower? No  Elevated toilet seat or a handicapped toilet? Yes   TIMED UP AND GO:  Was the test performed? No .  Length of time to ambulate 10 feet: n/a sec.   Appearance of gait: Gait not evaluated during this visit.  Cognitive Function:        04/03/2022   11:53 AM 05/22/2020    8:46 AM  6CIT Screen  What Year? 0 points 0 points  What month? 0 points 0 points  What time? 0 points 0 points  Count back from 20 0 points 0 points  Months in reverse 0 points 0 points  Repeat phrase 0 points 0 points  Total Score 0 points 0 points    Immunizations Immunization History  Administered Date(s) Administered   Influenza Whole 11/08/2009   Influenza,inj,Quad PF,6+ Mos 05/12/2013,  08/31/2014, 05/06/2017, 06/24/2018, 05/19/2019   Influenza-Unspecified 07/19/2015, 06/24/2018   Pneumococcal Conjugate-13 10/26/2014   Pneumococcal Polysaccharide-23 05/12/2013   Td 09/15/1997, 11/02/2008   Tdap 05/19/2019    TDAP status: Up to date  Flu Vaccine status: Declined, Education has been provided regarding the importance of this vaccine but patient still declined. Advised may receive this vaccine at local pharmacy or Health Dept. Aware to provide a copy of the vaccination record if obtained from local pharmacy or Health Dept. Verbalized acceptance and understanding.  Pneumococcal vaccine status: Up to date  Covid-19 vaccine status: Declined, Education has been provided regarding the importance of this vaccine but patient still declined.  Advised may receive this vaccine at local pharmacy or Health Dept.or vaccine clinic. Aware to provide a copy of the vaccination record if obtained from local pharmacy or Health Dept. Verbalized acceptance and understanding.  Qualifies for Shingles Vaccine? Yes   Zostavax completed No   Shingrix Completed?: No.    Education has been provided regarding the importance of this vaccine. Patient has been advised to call insurance company to determine out of pocket expense if they have not yet received this vaccine. Advised may also receive vaccine at local pharmacy or Health Dept. Verbalized acceptance and understanding.  Screening Tests Health Maintenance  Topic Date Due   Zoster Vaccines- Shingrix (1 of 2) Never done   Pneumonia Vaccine 80+ Years old (3 - PPSV23 or PCV20) 03/31/2022   INFLUENZA VACCINE  04/15/2022   HEMOGLOBIN A1C  04/20/2022   OPHTHALMOLOGY EXAM  09/05/2022   Diabetic kidney evaluation - GFR measurement  10/21/2022   Diabetic kidney evaluation - Urine ACR  10/21/2022   FOOT EXAM  10/22/2022   COLONOSCOPY (Pts 45-50yr Insurance coverage will need to be confirmed)  08/01/2028   TETANUS/TDAP  05/18/2029   Hepatitis C Screening  Completed   HIV Screening  Completed   HPV VACCINES  Aged Out   COVID-19 Vaccine  Discontinued    Health Maintenance  Health Maintenance Due  Topic Date Due   Zoster Vaccines- Shingrix (1 of 2) Never done   Pneumonia Vaccine 65 Years old (343- PPSV23 or PCV20) 03/31/2022    Colorectal cancer screening: Type of screening: Colonoscopy. Completed 08/01/2021. Repeat every 7 years  Lung Cancer Screening: (Low Dose CT Chest recommended if Age 65-80years, 30 pack-year currently smoking OR have quit w/in 15years.) does not qualify.   Lung Cancer Screening Referral: no  Additional Screening:  Hepatitis C Screening: does qualify; Completed 11/30/2008  Vision Screening: Recommended annual ophthalmology exams for early detection of glaucoma and other  disorders of the eye. Is the patient up to date with their annual eye exam?  Yes  Who is the provider or what is the name of the office in which the patient attends annual eye exams? [facility] If pt is not established with a provider, would they like to be referred to a provider to establish care? No .   Dental Screening: Recommended annual dental exams for proper oral hygiene  Community Resource Referral / Chronic Care Management: CRR required this visit?  No   CCM required this visit?  No      Plan:     I have personally reviewed and noted the following in the patient's chart:   Medical and social history Use of alcohol, tobacco or illicit drugs  Current medications and supplements including opioid prescriptions. Patient is not currently taking opioid prescriptions. Functional ability and status Nutritional status Physical activity  Advanced directives List of other physicians Hospitalizations, surgeries, and ER visits in previous 12 months Vitals Screenings to include cognitive, depression, and falls Referrals and appointments  In addition, I have reviewed and discussed with patient certain preventive protocols, quality metrics, and best practice recommendations. A written personalized care plan for preventive services as well as general preventive health recommendations were provided to patient.     Sheral Flow, LPN   4/33/2951   Nurse Notes:  There were no vitals filed for this visit. There is no height or weight on file to calculate BMI.

## 2022-04-03 NOTE — Patient Instructions (Signed)
Mr. Keith [name] , Thank you for taking time to come for your Medicare Wellness Visit. I appreciate your ongoing commitment to your health goals. Please review the following plan we discussed and let me know if I can assist you in the future.   Screening recommendations/referrals: Colonoscopy: 08/01/2021; due every 7 years Recommended yearly ophthalmology/optometry visit for glaucoma screening and checkup Recommended yearly dental visit for hygiene and checkup  Vaccinations: Influenza vaccine: declined Pneumococcal vaccine: 05/12/2013, 10/26/2014 Tdap vaccine: 05/19/2019; due every 10 years Shingles vaccine: declined   Covid-19: declined  Advanced directives: No  Conditions/risks identified: Yes; Type II Diabetes Mellitus  Next appointment: Please schedule your next Medicare Wellness Visit with your Nurse Health Advisor in 1 year by calling [phone].  Preventive Care 33 Years and Older, Male Preventive care refers to lifestyle choices and visits with your health care provider that can promote health and wellness. What does preventive care include? A yearly physical exam. This is also called an annual well check. Dental exams once or twice a year. Routine eye exams. Ask your health care provider how often you should have your eyes checked. Personal lifestyle choices, including: Daily care of your teeth and gums. Regular physical activity. Eating a healthy diet. Avoiding tobacco and drug use. Limiting alcohol use. Practicing safe sex. Taking low doses of aspirin every day. Taking vitamin and mineral supplements as recommended by your health care provider. What happens during an annual well check? The services and screenings done by your health care provider during your annual well check will depend on your age, overall health, lifestyle risk factors, and family history of disease. Counseling  Your health care provider may ask you questions about your: Alcohol use. Tobacco  use. Drug use. Emotional well-being. Home and relationship well-being. Sexual activity. Eating habits. History of falls. Memory and ability to understand (cognition). Work and work Statistician. Screening  You may have the following tests or measurements: Height, weight, and BMI. Blood pressure. Lipid and cholesterol levels. These may be checked every 5 years, or more frequently if you are over 107 years old. Skin check. Lung cancer screening. You may have this screening every year starting at age 79 if you have a 30-pack-year history of smoking and currently smoke or have quit within the past 15 years. Fecal occult blood test (FOBT) of the stool. You may have this test every year starting at age 69. Flexible sigmoidoscopy or colonoscopy. You may have a sigmoidoscopy every 5 years or a colonoscopy every 10 years starting at age 31. Prostate cancer screening. Recommendations will vary depending on your family history and other risks. Hepatitis C blood test. Hepatitis B blood test. Sexually transmitted disease (STD) testing. Diabetes screening. This is done by checking your blood sugar (glucose) after you have not eaten for a while (fasting). You may have this done every 1-3 years. Abdominal aortic aneurysm (AAA) screening. You may need this if you are a current or former smoker. Osteoporosis. You may be screened starting at age 75 if you are at high risk. Talk with your health care provider about your test results, treatment options, and if necessary, the need for more tests. Vaccines  Your health care provider may recommend certain vaccines, such as: Influenza vaccine. This is recommended every year. Tetanus, diphtheria, and acellular pertussis (Tdap, Td) vaccine. You may need a Td booster every 10 years. Zoster vaccine. You may need this after age 52. Pneumococcal 13-valent conjugate (PCV13) vaccine. One dose is recommended after age 15. Pneumococcal polysaccharide (PPSV23)  vaccine.  One dose is recommended after age 77. Talk to your health care provider about which screenings and vaccines you need and how often you need them. This information is not intended to replace advice given to you by your health care provider. Make sure you discuss any questions you have with your health care provider. Document Released: 09/28/2015 Document Revised: 05/21/2016 Document Reviewed: 07/03/2015 Elsevier Interactive Patient Education  2017 [employer] Prevention in the Home Falls can cause injuries. They can happen to people of all ages. There are many things you can do to make your home safe and to help prevent falls. What can I do on the outside of my home? Regularly fix the edges of walkways and driveways and fix any cracks. Remove anything that might make you trip as you walk through a door, such as a raised step or threshold. Trim any bushes or trees on the path to your home. Use bright outdoor lighting. Clear any walking paths of anything that might make someone trip, such as rocks or tools. Regularly check to see if handrails are loose or broken. Make sure that both sides of any steps have handrails. Any raised decks and porches should have guardrails on the edges. Have any leaves, snow, or ice cleared regularly. Use sand or salt on walking paths during winter. Clean up any spills in your garage right away. This includes oil or grease spills. What can I do in the bathroom? Use night lights. Install grab bars by the toilet and in the tub and shower. Do not use towel bars as grab bars. Use non-skid mats or decals in the tub or shower. If you need to sit down in the shower, use a plastic, non-slip stool. Keep the floor dry. Clean up any water that spills on the floor as soon as it happens. Remove soap buildup in the tub or shower regularly. Attach bath mats securely with double-sided non-slip rug tape. Do not have throw rugs and other things on the floor that can make  you trip. What can I do in the bedroom? Use night lights. Make sure that you have a light by your bed that is easy to reach. Do not use any sheets or blankets that are too big for your bed. They should not hang down onto the floor. Have a firm chair that has side arms. You can use this for support while you get dressed. Do not have throw rugs and other things on the floor that can make you trip. What can I do in the kitchen? Clean up any spills right away. Avoid walking on wet floors. Keep items that you use a lot in easy-to-reach places. If you need to reach something above you, use a strong step stool that has a grab bar. Keep electrical cords out of the way. Do not use floor polish or wax that makes floors slippery. If you must use wax, use non-skid floor wax. Do not have throw rugs and other things on the floor that can make you trip. What can I do with my stairs? Do not leave any items on the stairs. Make sure that there are handrails on both sides of the stairs and use them. Fix handrails that are broken or loose. Make sure that handrails are as long as the stairways. Check any carpeting to make sure that it is firmly attached to the stairs. Fix any carpet that is loose or worn. Avoid having throw rugs at the top or bottom  of the stairs. If you do have throw rugs, attach them to the floor with carpet tape. Make sure that you have a light switch at the top of the stairs and the bottom of the stairs. If you do not have them, ask someone to add them for you. What else can I do to help prevent falls? Wear shoes that: Do not have high heels. Have rubber bottoms. Are comfortable and fit you well. Are closed at the toe. Do not wear sandals. If you use a stepladder: Make sure that it is fully opened. Do not climb a closed stepladder. Make sure that both sides of the stepladder are locked into place. Ask someone to hold it for you, if possible. Clearly mark and make sure that you can  see: Any grab bars or handrails. First and last steps. Where the edge of each step is. Use tools that help you move around (mobility aids) if they are needed. These include: Canes. Walkers. Scooters. Crutches. Turn on the lights when you go into a dark area. Replace any light bulbs as soon as they burn out. Set up your furniture so you have a clear path. Avoid moving your furniture around. If any of your floors are uneven, fix them. If there are any pets around you, be aware of where they are. Review your medicines with your doctor. Some medicines can make you feel dizzy. This can increase your chance of falling. Ask your doctor what other things that you can do to help prevent falls. This information is not intended to replace advice given to you by your health care provider. Make sure you discuss any questions you have with your health care provider. Document Released: 06/28/2009 Document Revised: 02/07/2016 Document Reviewed: 10/06/2014 Elsevier Interactive Patient Education  2017 [employer].

## 2022-04-17 ENCOUNTER — Other Ambulatory Visit (INDEPENDENT_AMBULATORY_CARE_PROVIDER_SITE_OTHER): Payer: Medicare HMO

## 2022-04-17 DIAGNOSIS — E538 Deficiency of other specified B group vitamins: Secondary | ICD-10-CM | POA: Diagnosis not present

## 2022-04-17 DIAGNOSIS — Z794 Long term (current) use of insulin: Secondary | ICD-10-CM | POA: Diagnosis not present

## 2022-04-17 DIAGNOSIS — E559 Vitamin D deficiency, unspecified: Secondary | ICD-10-CM

## 2022-04-17 DIAGNOSIS — E119 Type 2 diabetes mellitus without complications: Secondary | ICD-10-CM | POA: Diagnosis not present

## 2022-04-17 LAB — BASIC METABOLIC PANEL
BUN: 21 mg/dL (ref 6–23)
CO2: 25 mEq/L (ref 19–32)
Calcium: 9.7 mg/dL (ref 8.4–10.5)
Chloride: 103 mEq/L (ref 96–112)
Creatinine, Ser: 1.41 mg/dL (ref 0.40–1.50)
GFR: 52.47 mL/min — ABNORMAL LOW (ref >60.00)
Glucose, Bld: 172 mg/dL — ABNORMAL HIGH (ref 70–99)
Potassium: 4 mEq/L (ref 3.5–5.1)
Sodium: 138 mEq/L (ref 135–145)

## 2022-04-17 LAB — HEPATIC FUNCTION PANEL
ALT: 14 U/L (ref 0–53)
AST: 16 U/L (ref 0–37)
Albumin: 4.6 g/dL (ref 3.5–5.2)
Alkaline Phosphatase: 41 U/L (ref 39–117)
Bilirubin, Direct: 0.1 mg/dL (ref 0.0–0.3)
Total Bilirubin: 0.4 mg/dL (ref 0.2–1.2)
Total Protein: 7.6 g/dL (ref 6.0–8.3)

## 2022-04-17 LAB — LIPID PANEL
Cholesterol: 95 mg/dL (ref 0–200)
HDL: 39.5 mg/dL (ref >39.00)
LDL Cholesterol: 36 mg/dL (ref 0–99)
NonHDL: 55.98
Total CHOL/HDL Ratio: 2
Triglycerides: 100 mg/dL (ref 0.0–149.0)
VLDL: 20 mg/dL (ref 0.0–40.0)

## 2022-04-17 LAB — HEMOGLOBIN A1C: Hgb A1c MFr Bld: 7.1 % — ABNORMAL HIGH (ref 4.6–6.5)

## 2022-04-17 LAB — VITAMIN D 25 HYDROXY (VIT D DEFICIENCY, FRACTURES): VITD: 44.85 ng/mL (ref 30.00–100.00)

## 2022-04-17 LAB — VITAMIN B12: Vitamin B-12: 411 pg/mL (ref 211–911)

## 2022-04-18 ENCOUNTER — Other Ambulatory Visit: Payer: Self-pay | Admitting: Internal Medicine

## 2022-04-22 ENCOUNTER — Ambulatory Visit (INDEPENDENT_AMBULATORY_CARE_PROVIDER_SITE_OTHER): Payer: Medicare HMO | Admitting: Internal Medicine

## 2022-04-22 ENCOUNTER — Encounter: Payer: Self-pay | Admitting: Internal Medicine

## 2022-04-22 VITALS — BP 128/70 | HR 66 | Ht 70.0 in | Wt 249.0 lb

## 2022-04-22 DIAGNOSIS — I10 Essential (primary) hypertension: Secondary | ICD-10-CM

## 2022-04-22 DIAGNOSIS — E7800 Pure hypercholesterolemia, unspecified: Secondary | ICD-10-CM

## 2022-04-22 DIAGNOSIS — Z794 Long term (current) use of insulin: Secondary | ICD-10-CM | POA: Diagnosis not present

## 2022-04-22 DIAGNOSIS — Z23 Encounter for immunization: Secondary | ICD-10-CM | POA: Diagnosis not present

## 2022-04-22 DIAGNOSIS — E538 Deficiency of other specified B group vitamins: Secondary | ICD-10-CM | POA: Diagnosis not present

## 2022-04-22 DIAGNOSIS — Z125 Encounter for screening for malignant neoplasm of prostate: Secondary | ICD-10-CM

## 2022-04-22 DIAGNOSIS — E559 Vitamin D deficiency, unspecified: Secondary | ICD-10-CM

## 2022-04-22 DIAGNOSIS — E119 Type 2 diabetes mellitus without complications: Secondary | ICD-10-CM | POA: Diagnosis not present

## 2022-04-22 MED ORDER — ROSUVASTATIN CALCIUM 5 MG PO TABS
5.0000 mg | ORAL_TABLET | ORAL | 3 refills | Status: DC
Start: 2022-04-22 — End: 2022-09-12

## 2022-04-22 MED ORDER — OZEMPIC (0.25 OR 0.5 MG/DOSE) 2 MG/3ML ~~LOC~~ SOPN: PEN_INJECTOR | SUBCUTANEOUS | 3 refills | Status: DC

## 2022-04-22 NOTE — Patient Instructions (Addendum)
You had the Pneumovax pneumonia shot today  Please restart the generic crestor at 5 mg at least once per wk  Please remember to restart the Repatha shots  Please take all new medication as prescribed - the Ozempic shots for sugar and weight control  Ok to STOP the glipizide ONLY IF you are able to start the Ozempic  Please continue all other medications as before, and refills have been done if requested.  Please have the pharmacy call with any other refills you may need.  Please continue your efforts at being more active, low cholesterol diabetic diet, and weight control.  Please keep your appointments with your specialists as you may have planned  Please go to the LAB at the blood drawing area for the tests to be done  You will be contacted by phone if any changes need to be made immediately.  Otherwise, you will receive a letter about your results with an explanation, but please check with MyChart first.  Please remember to sign up for MyChart if you have not done so, as this will be important to you in the future with finding out test results, communicating by private email, and scheduling acute appointments online when needed.  Please make an Appointment to return in 6 months, or sooner if needed

## 2022-04-22 NOTE — Assessment & Plan Note (Signed)
BP Readings from Last 3 Encounters:  04/22/22 128/70  03/14/22 124/88  02/13/22 110/70   Stable, pt to continue medical treatment norvasc 5 mg qd

## 2022-04-22 NOTE — Assessment & Plan Note (Signed)
Lab Results  Component Value Date   LDLCALC 36 04/17/2022   Stable, pt to continue current statin crestor 5 qwk, and repatha asd

## 2022-04-22 NOTE — Assessment & Plan Note (Signed)
Lab Results  Component Value Date   HGBA1C 7.1 (H) 04/17/2022   Mild uncontrolled, pt to continue current medical treatment glipizide xl 10 mg qd, metformin ER 500 gm - 4 tab qd, actos 45 mg qd, and add ozempic 0.5 mg qwk

## 2022-04-22 NOTE — Progress Notes (Signed)
Patient ID: Keith [name], male   DOB: 1955/09/21, 66 y.o.   MRN: [id number]        Chief Complaint: follow up HTN, HLD and hyperglycemia, ckd       HPI:  Keith [name] is a 66 y.o. male here overall doing ok, Pt denies chest pain, increased sob or doe, wheezing, orthopnea, PND, increased LE swelling, palpitations, dizziness or syncope.   Pt denies polydipsia, polyuria, or new focal neuro s/s.    Pt denies fever, wt loss, night sweats, loss of appetite, or other constitutional symptoms  Due for pneumovax.  Was unable to get rybelsus due to cost after last visit.  Has simply forgotten to take repatha in the past month, but willing to restart, and even 1 day per wk crestor.  Wt is increased several lbs.         Wt Readings from Last 3 Encounters:  04/22/22 249 lb (112.9 kg)  03/14/22 247 lb (112 kg)  02/13/22 244 lb 8 oz (110.9 kg)   BP Readings from Last 3 Encounters:  04/22/22 128/70  03/14/22 124/88  02/13/22 110/70         Past Medical History:  Diagnosis Date   Anxiety    Arthritis    Cervical spine pain    Chronic   Chronic HFimpEF (heart failure with improved ejection fraction) ([place])    a. 01/2010 Echo: EF 30%, diff HK; b. 08/2014 Echo: EF 50%, no rwma, GrI DD.   Diabetes mellitus    Diverticulosis    Fatty liver    GERD (gastroesophageal reflux disease)    Hereditary hemochromatosis    Compound heterozygote. Pt gets periodic phlebotomies   History of colonic polyps    History of NICM (nonischemic cardiomyopathy) w/ subsequent improvement in LV function(HCC)    a. Echo (5/11) showed EF 30% with diffuse hypokinesis, normal wall thickness, mildly decreased RV systolic function. This may be a cardiomyopathy due to hemochromatosis versus tachy-mediated; b. 08/2014 Echo: EF 50%, no rwma, GrI DD.   Hyperlipidemia    Hypertension    Hypothyroidism    Low back pain    The pt is S/P spinal fusion surgeries   Multifocal atrial tachycardia ([place])    a. Holter monior in 5/11  showed frequent runs of symtomatic MAT with heart rate around 100-->prev on amio-->now on bb only.   Nonobstructive CAD (coronary artery disease)    a. 11/2008 MV: EF 50%, Inf HK, inferior scar and ischemia in the apical anterior septum and in the inferior wall; b. 11/2008 Cath: LM nl, LAD min irregs, LCX nl, OM1/2 nl, RCA nl.   Peptic ulcer disease    Thyroid nodule    Wandering atrial pacemaker    Past Surgical History:  Procedure Laterality Date   CORONARY STENT INTERVENTION [name] 09/06/2021   Procedure: CORONARY STENT INTERVENTION;  Surgeon: Belva Crome, MD;  Location: [facility];  Service: Cardiovascular;  Laterality: [name];   INGUINAL HERNIA REPAIR Right    LEFT HEART CATH AND CORONARY ANGIOGRAPHY [name] 09/06/2021   Procedure: LEFT HEART CATH AND CORONARY ANGIOGRAPHY;  Surgeon: Belva Crome, MD;  Location: [facility];  Service: Cardiovascular;  Laterality: [name];   LUMBAR [place] SURGERY  09/15/1989   TONSILLECTOMY      reports that he quit smoking about 18 years ago. His smoking use included cigarettes. He has never used smokeless tobacco. He reports that he does not currently use alcohol. He reports that he does  not currently use drugs. family history includes Alcohol abuse in his father; Colitis in his mother; Colon polyps in his mother; Diabetes in his mother, paternal grandfather, and paternal grandmother; Hypertension in his father; Multiple sclerosis in his mother and sister. Allergies  Allergen Reactions   Dicyclomine Nausea And Vomiting and Other (See Comments)    Lightheaded, dizziness, SOB   Doxycycline Nausea Only    Weak, fatigue, "sick on stomach"   Lipitor [Atorvastatin]     myalgias   Penicillins Other (See Comments)    Childhood allergy - unknown reaction   Reglan [Metoclopramide] Other (See Comments)    Unknown reaction   Zocor [Simvastatin] Other (See Comments)    Myalgia    Zoloft [Sertraline Hcl] Other (See Comments)    Unknown reaction     Current Outpatient Medications on File Prior to Visit  Medication Sig Dispense Refill   Alcohol Swabs (B-D SINGLE USE SWABS REGULAR) PADS Use as directed once daily E11.9 100 each 3   amLODipine (NORVASC) 5 MG tablet Take 1 tablet (5 mg total) by mouth daily. 90 tablet 3   aspirin 81 MG EC tablet Take 81 mg by mouth daily.     Blood Glucose Calibration (TRUE METRIX LEVEL 1) Low SOLN Use as directed once daily E11.9 1 each 3   Blood Glucose Monitoring Suppl (TRUE METRIX AIR GLUCOSE METER) w/Device KIT USE AS DIRECTED 1 kit 0   Cholecalciferol (VITAMIN D-3) 25 MCG (1000 UT) CAPS Take 2,000 Units by mouth daily.     clopidogrel (PLAVIX) 75 MG tablet Take 1 tablet (75 mg total) by mouth daily. 90 tablet 3   DROPLET PEN NEEDLES 32G X 4 MM MISC      Evolocumab (REPATHA SURECLICK) 696 MG/ML SOAJ Inject 140 mg into the skin every 14 (fourteen) days. 2 mL 11   fenofibrate (TRICOR) 145 MG tablet Take 1 tablet (145 mg total) by mouth daily. 90 tablet 3   glipiZIDE (GLUCOTROL XL) 10 MG 24 hr tablet Take 1 tablet (10 mg total) by mouth daily with breakfast. 90 tablet 3   glucosamine-chondroitin 500-400 MG tablet Take 1 tablet by mouth daily.     glucose blood (RELION TRUE METRIX TEST STRIPS) test strip Use as instructed once daily E11.9 100 each 12   levothyroxine (SYNTHROID) 25 MCG tablet TAKE 1 TABLET EVERY DAY (NEED MD APPOINTMENT) 90 tablet 3   metFORMIN (GLUCOPHAGE-XR) 500 MG 24 hr tablet Take 4 tablets (2,000 mg total) by mouth daily with breakfast. 360 tablet 3   metoprolol succinate (TOPROL-XL) 100 MG 24 hr tablet Take 1 tablet (100 mg total) by mouth 2 (two) times daily. Take with or immediately following a meal. 180 tablet 3   olmesartan (BENICAR) 40 MG tablet Take 1 tablet (40 mg total) by mouth daily. 90 tablet 3   Omega-3 Fatty Acids (FISH OIL PO) Take 1 tablet by mouth daily.      pantoprazole (PROTONIX) 40 MG tablet Take 1 tablet (40 mg total) by mouth daily. 90 tablet 3   pioglitazone  (ACTOS) 45 MG tablet Take 1 tablet (45 mg total) by mouth daily. 90 tablet 3   tadalafil (CIALIS) 20 MG tablet Take 1 tablet (20 mg total) by mouth daily as needed for erectile dysfunction. 30 tablet 11   TRUEplus Lancets 33G MISC Use as directed once daily E11.9 100 each 12   No current facility-administered medications on file prior to visit.        ROS:  All others  reviewed and negative.  Objective        PE:  BP 128/70 (BP Location: Right Arm, Patient Position: Sitting, Cuff Size: Large)   Pulse 66   Ht '5\' 10"'  (1.778 m)   Wt 249 lb (112.9 kg)   SpO2 96%   BMI 35.73 kg/m                 Constitutional: Pt appears in NAD               HENT: Head: NCAT.                Right Ear: External ear normal.                 Left Ear: External ear normal.                Eyes: . Pupils are equal, round, and reactive to light. Conjunctivae and EOM are normal               Nose: without d/c or deformity               Neck: Neck supple. Gross normal ROM               Cardiovascular: Normal rate and regular rhythm.                 Pulmonary/Chest: Effort normal and breath sounds without rales or wheezing.                Abd:  Soft, NT, ND, + BS, no organomegaly               Neurological: Pt is alert. At baseline orientation, motor grossly intact               Skin: Skin is warm. No rashes, no other new lesions, LE edema - none               Psychiatric: Pt behavior is normal without agitation   Micro: none  Cardiac tracings I have personally interpreted today:  none  Pertinent Radiological findings (summarize): none   Lab Results  Component Value Date   WBC 10.1 02/13/2022   HGB 14.4 02/13/2022   HCT 40.3 02/13/2022   PLT 264 02/13/2022   GLUCOSE 172 (H) 04/17/2022   CHOL 95 04/17/2022   TRIG 100.0 04/17/2022   HDL 39.50 04/17/2022   LDLDIRECT 127.0 11/15/2019   LDLCALC 36 04/17/2022   ALT 14 04/17/2022   AST 16 04/17/2022   NA 138 04/17/2022   K 4.0 04/17/2022   CL 103  04/17/2022   CREATININE 1.41 04/17/2022   BUN 21 04/17/2022   CO2 25 04/17/2022   TSH 4.290 02/13/2022   PSA 1.29 10/21/2021   INR 1.1 (H) 07/09/2016   HGBA1C 7.1 (H) 04/17/2022   MICROALBUR <0.7 10/21/2021   Assessment/Plan:  Keith [name] is a 66 y.o. White or Caucasian [1] male with  has a past medical history of Anxiety, Arthritis, Cervical spine pain, Chronic HFimpEF (heart failure with improved ejection fraction) ([place]), Diabetes mellitus, Diverticulosis, Fatty liver, GERD (gastroesophageal reflux disease), Hereditary hemochromatosis, History of colonic polyps, History of NICM (nonischemic cardiomyopathy) w/ subsequent improvement in LV function(HCC), Hyperlipidemia, Hypertension, Hypothyroidism, Low back pain, Multifocal atrial tachycardia ([facility]), Nonobstructive CAD (coronary artery disease), Peptic ulcer disease, Thyroid nodule, and Wandering atrial pacemaker.  Insulin dependent type 2 diabetes mellitus, controlled ([place]) Lab Results  Component Value Date   HGBA1C 7.1 (H) 04/17/2022  Mild uncontrolled, pt to continue current medical treatment glipizide xl 10 mg qd, metformin ER 500 gm - 4 tab qd, actos 45 mg qd, and add ozempic 0.5 mg qwk   Essential hypertension BP Readings from Last 3 Encounters:  04/22/22 128/70  03/14/22 124/88  02/13/22 110/70   Stable, pt to continue medical treatment norvasc 5 mg qd   Hyperlipidemia Lab Results  Component Value Date   LDLCALC 36 04/17/2022   Stable, pt to continue current statin crestor 5 qwk, and repatha asd  Followup: Return in about 6 months (around 10/23/2022).  Cathlean Cower, MD 04/22/2022 10:21 PM [facility]

## 2022-07-12 ENCOUNTER — Other Ambulatory Visit: Payer: Self-pay | Admitting: Internal Medicine

## 2022-07-12 NOTE — Telephone Encounter (Signed)
Please refill as per office routine med refill policy (all routine meds to be refilled for 3 mo or monthly (per pt preference) up to one year from last visit, then month to month grace period for 3 mo, then further med refills will have to be denied) ? ?
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
## 2022-09-12 ENCOUNTER — Ambulatory Visit (HOSPITAL_BASED_OUTPATIENT_CLINIC_OR_DEPARTMENT_OTHER): Payer: Medicare HMO | Admitting: Family

## 2022-09-12 ENCOUNTER — Encounter (HOSPITAL_BASED_OUTPATIENT_CLINIC_OR_DEPARTMENT_OTHER): Payer: Self-pay | Admitting: Family

## 2022-09-12 VITALS — BP 132/78 | HR 64 | Ht 70.5 in | Wt 250.6 lb

## 2022-09-12 DIAGNOSIS — G720 Drug-induced myopathy: Secondary | ICD-10-CM | POA: Diagnosis not present

## 2022-09-12 DIAGNOSIS — E785 Hyperlipidemia, unspecified: Secondary | ICD-10-CM

## 2022-09-12 DIAGNOSIS — E039 Hypothyroidism, unspecified: Secondary | ICD-10-CM

## 2022-09-12 DIAGNOSIS — T466X5D Adverse effect of antihyperlipidemic and antiarteriosclerotic drugs, subsequent encounter: Secondary | ICD-10-CM | POA: Diagnosis not present

## 2022-09-12 DIAGNOSIS — I491 Atrial premature depolarization: Secondary | ICD-10-CM | POA: Diagnosis not present

## 2022-09-12 DIAGNOSIS — I25118 Atherosclerotic heart disease of native coronary artery with other forms of angina pectoris: Secondary | ICD-10-CM

## 2022-09-12 DIAGNOSIS — E118 Type 2 diabetes mellitus with unspecified complications: Secondary | ICD-10-CM | POA: Diagnosis not present

## 2022-09-12 MED ORDER — REPATHA SURECLICK 140 MG/ML ~~LOC~~ SOAJ: 140.0000 mg | SUBCUTANEOUS | 11 refills | Status: DC

## 2022-09-12 NOTE — Patient Instructions (Signed)
Medication Instructions:  Your Physician recommend you continue on your current medication as directed.    Please fill out repatha patient assistance and bring back to the office!  *If you need a refill on your cardiac medications before your next appointment, please call your pharmacy*  Follow-Up: At [facility], you and your health needs are our priority.  As part of our continuing mission to provide you with exceptional heart care, we have created designated Provider Care Teams.  These Care Teams include your primary Cardiologist (physician) and Advanced Practice Providers (APPs -  Physician Assistants and Nurse Practitioners) who all work together to provide you with the care you need, when you need it.  We recommend signing up for the patient portal called "MyChart".  Sign up information is provided on this After Visit Summary.  MyChart is used to connect with patients for Virtual Visits (Telemedicine).  Patients are able to view lab/test results, encounter notes, upcoming appointments, etc.  Non-urgent messages can be sent to your provider as well.   To learn more about what you can do with MyChart, go to [email or web address].    Your next appointment:   6 month(s)  The format for your next appointment:   In Person  Provider:   Kirk Ruths, MD  or Laurann Montana, NP

## 2022-09-17 ENCOUNTER — Encounter (HOSPITAL_BASED_OUTPATIENT_CLINIC_OR_DEPARTMENT_OTHER): Payer: Self-pay

## 2022-09-17 MED ORDER — REPATHA SURECLICK 140 MG/ML ~~LOC~~ SOAJ: 140.0000 mg | SUBCUTANEOUS | 11 refills | Status: DC

## 2022-09-17 NOTE — Addendum Note (Signed)
Addended by: Gerald Stabs on: 09/17/2022 01:02 PM   Modules accepted: Orders

## 2022-09-30 ENCOUNTER — Other Ambulatory Visit (HOSPITAL_COMMUNITY): Payer: Self-pay

## 2022-10-15 IMAGING — CR DG CHEST 2V
2 series · 2 of 2 positions shown · non-contrast
Comparison: November 11, 2017

CLINICAL DATA: Mid chest pain.

EXAM:
CHEST - 2 VIEW

[w chest pa]
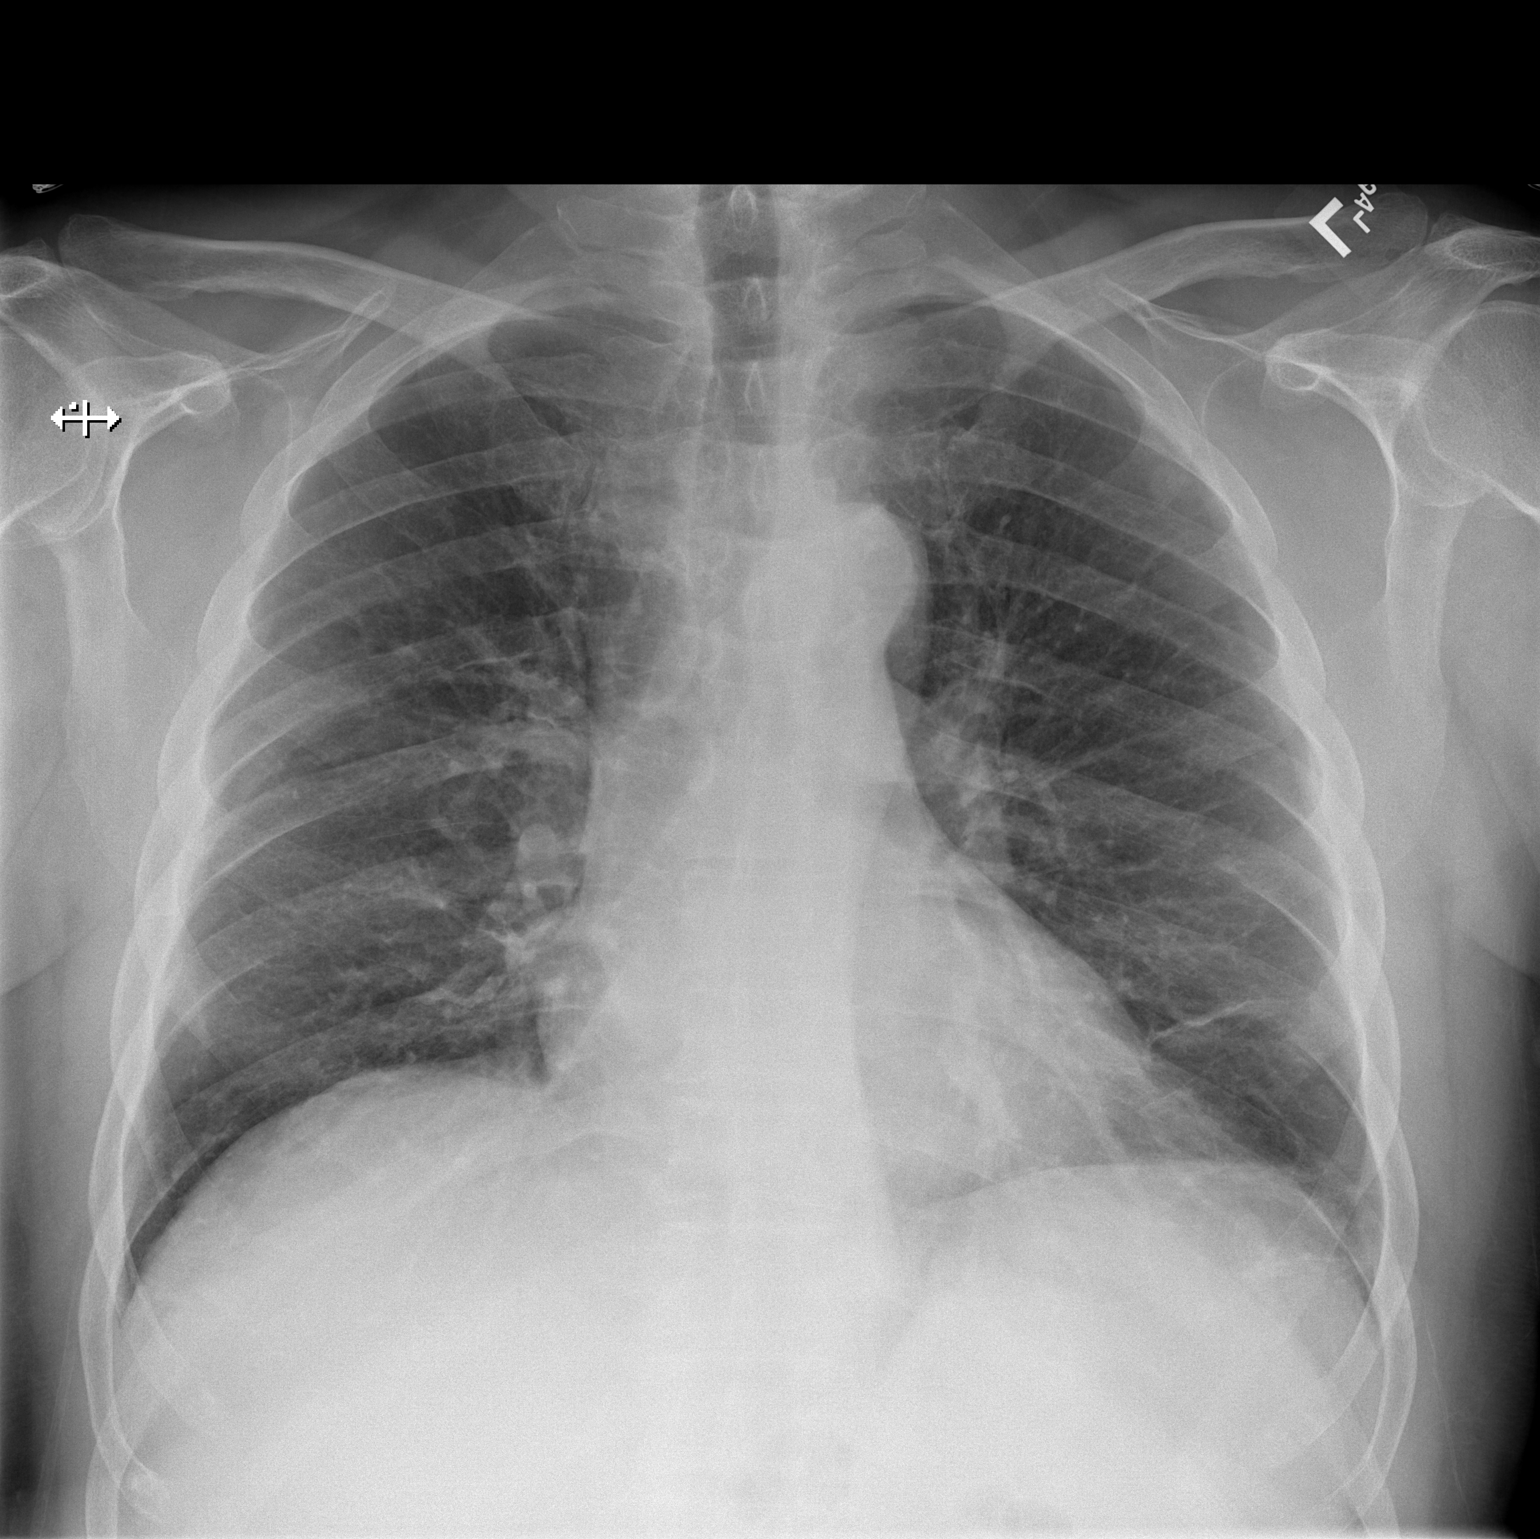

[w chest lat]
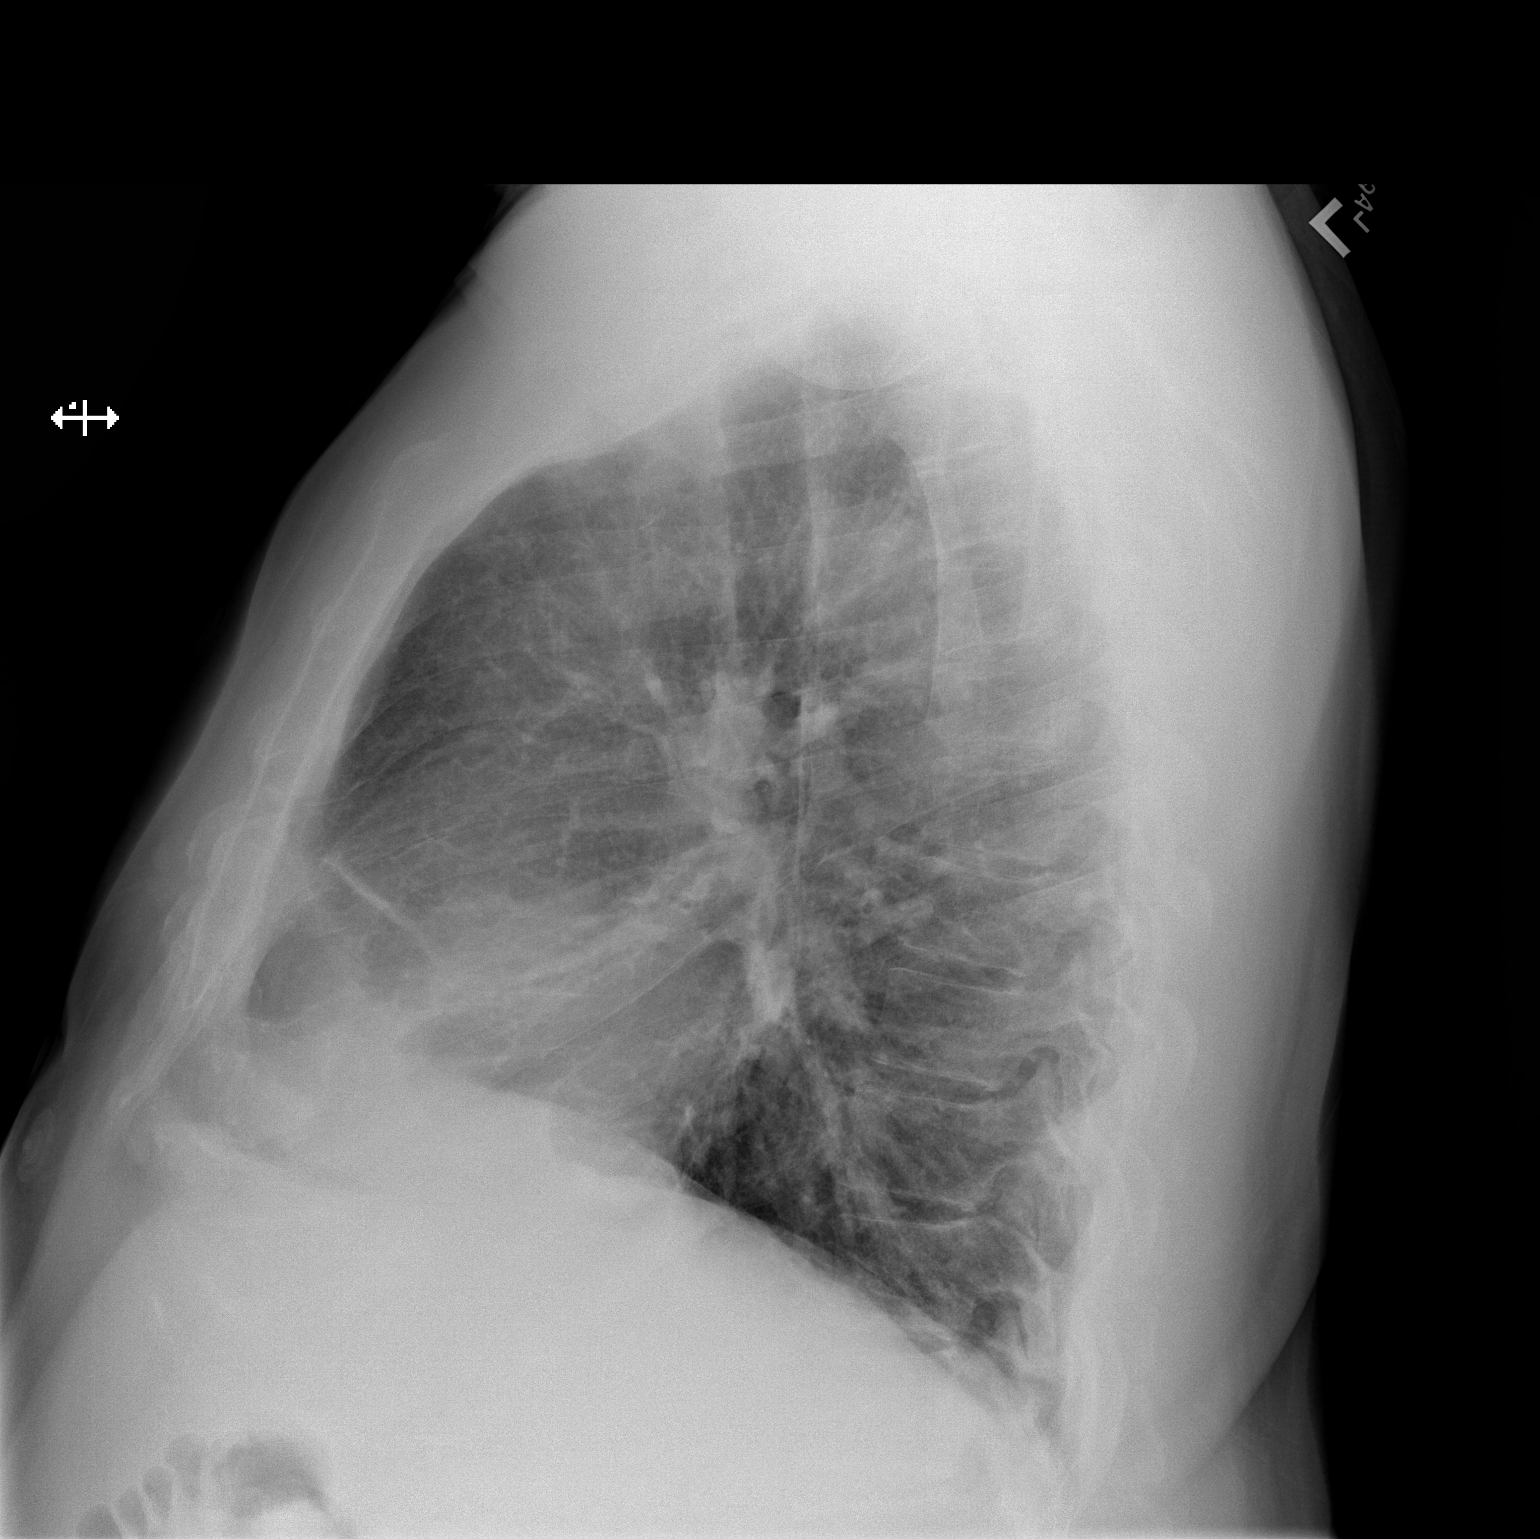

[2 of 2 positions shown; findings below may reference images not displayed]

FINDINGS: Very mild linear atelectasis is seen within the left lung base.
There is no evidence of acute infiltrate, pleural effusion or
pneumothorax. The heart size and mediastinal contours are within
normal limits. The visualized skeletal structures are unremarkable.
IMPRESSION: Very mild left basilar linear atelectasis.

## 2022-10-31 ENCOUNTER — Other Ambulatory Visit (INDEPENDENT_AMBULATORY_CARE_PROVIDER_SITE_OTHER): Payer: Medicare HMO

## 2022-10-31 DIAGNOSIS — E559 Vitamin D deficiency, unspecified: Secondary | ICD-10-CM

## 2022-10-31 DIAGNOSIS — E119 Type 2 diabetes mellitus without complications: Secondary | ICD-10-CM

## 2022-10-31 DIAGNOSIS — Z125 Encounter for screening for malignant neoplasm of prostate: Secondary | ICD-10-CM | POA: Diagnosis not present

## 2022-10-31 DIAGNOSIS — E538 Deficiency of other specified B group vitamins: Secondary | ICD-10-CM | POA: Diagnosis not present

## 2022-10-31 DIAGNOSIS — Z794 Long term (current) use of insulin: Secondary | ICD-10-CM | POA: Diagnosis not present

## 2022-10-31 LAB — CBC WITH DIFFERENTIAL/PLATELET
Basophils Absolute: 0.1 10*3/uL (ref 0.0–0.1)
Basophils Relative: 1.1 % (ref 0.0–3.0)
Eosinophils Absolute: 0.2 10*3/uL (ref 0.0–0.7)
Eosinophils Relative: 2.1 % (ref 0.0–5.0)
HCT: 43.1 % (ref 39.0–52.0)
Hemoglobin: 14.7 g/dL (ref 13.0–17.0)
Lymphocytes Relative: 29.6 % (ref 12.0–46.0)
Lymphs Abs: 2.2 10*3/uL (ref 0.7–4.0)
MCHC: 34.1 g/dL (ref 30.0–36.0)
MCV: 89.6 fl (ref 78.0–100.0)
Monocytes Absolute: 0.7 10*3/uL (ref 0.1–1.0)
Monocytes Relative: 9.1 % (ref 3.0–12.0)
Neutro Abs: 4.3 10*3/uL (ref 1.4–7.7)
Neutrophils Relative %: 58.1 % (ref 43.0–77.0)
Platelets: 228 10*3/uL (ref 150.0–400.0)
RBC: 4.81 Mil/uL (ref 4.22–5.81)
RDW: 14.2 % (ref 11.5–15.5)
WBC: 7.4 10*3/uL (ref 4.0–10.5)

## 2022-10-31 LAB — HEPATIC FUNCTION PANEL
ALT: 15 U/L (ref 0–53)
AST: 14 U/L (ref 0–37)
Albumin: 4.3 g/dL (ref 3.5–5.2)
Alkaline Phosphatase: 42 U/L (ref 39–117)
Bilirubin, Direct: 0.1 mg/dL (ref 0.0–0.3)
Total Bilirubin: 0.6 mg/dL (ref 0.2–1.2)
Total Protein: 7.1 g/dL (ref 6.0–8.3)

## 2022-10-31 LAB — BASIC METABOLIC PANEL
BUN: 16 mg/dL (ref 6–23)
CO2: 28 mEq/L (ref 19–32)
Calcium: 9.7 mg/dL (ref 8.4–10.5)
Chloride: 101 mEq/L (ref 96–112)
Creatinine, Ser: 1.32 mg/dL (ref 0.40–1.50)
GFR: 56.57 mL/min — ABNORMAL LOW (ref >60.00)
Glucose, Bld: 115 mg/dL — ABNORMAL HIGH (ref 70–99)
Potassium: 4.3 mEq/L (ref 3.5–5.1)
Sodium: 137 mEq/L (ref 135–145)

## 2022-10-31 LAB — URINALYSIS, ROUTINE W REFLEX MICROSCOPIC
Bilirubin Urine: NEGATIVE
Hgb urine dipstick: NEGATIVE
Ketones, ur: NEGATIVE
Leukocytes,Ua: NEGATIVE
Nitrite: NEGATIVE
Specific Gravity, Urine: 1.02 (ref 1.000–1.030)
Total Protein, Urine: NEGATIVE
Urine Glucose: 250 — AB
Urobilinogen, UA: 0.2 (ref 0.0–1.0)
pH: 6 (ref 5.0–8.0)

## 2022-10-31 LAB — LIPID PANEL
Cholesterol: 183 mg/dL (ref 0–200)
HDL: 32.5 mg/dL — ABNORMAL LOW (ref >39.00)
LDL Cholesterol: 123 mg/dL — ABNORMAL HIGH (ref 0–99)
NonHDL: 150.83
Total CHOL/HDL Ratio: 6
Triglycerides: 138 mg/dL (ref 0.0–149.0)
VLDL: 27.6 mg/dL (ref 0.0–40.0)

## 2022-10-31 LAB — TSH: TSH: 5.33 u[IU]/mL (ref 0.35–5.50)

## 2022-10-31 LAB — HEMOGLOBIN A1C: Hgb A1c MFr Bld: 6.4 % (ref 4.6–6.5)

## 2022-10-31 LAB — VITAMIN D 25 HYDROXY (VIT D DEFICIENCY, FRACTURES): VITD: 40.53 ng/mL (ref 30.00–100.00)

## 2022-10-31 LAB — MICROALBUMIN / CREATININE URINE RATIO
Creatinine,U: 105.7 mg/dL
Microalb Creat Ratio: 0.7 mg/g (ref 0.0–30.0)
Microalb, Ur: <0.7 mg/dL (ref 0.0–1.9)

## 2022-10-31 LAB — VITAMIN B12: Vitamin B-12: 238 pg/mL (ref 211–911)

## 2022-10-31 LAB — PSA: PSA: 1.08 ng/mL (ref 0.10–4.00)

## 2022-11-05 ENCOUNTER — Ambulatory Visit (INDEPENDENT_AMBULATORY_CARE_PROVIDER_SITE_OTHER): Payer: Medicare HMO | Admitting: Internal Medicine

## 2022-11-05 VITALS — BP 136/80 | HR 70 | Temp 98.0°F | Ht 70.5 in | Wt 250.0 lb

## 2022-11-05 DIAGNOSIS — E785 Hyperlipidemia, unspecified: Secondary | ICD-10-CM

## 2022-11-05 DIAGNOSIS — Z794 Long term (current) use of insulin: Secondary | ICD-10-CM

## 2022-11-05 DIAGNOSIS — I509 Heart failure, unspecified: Secondary | ICD-10-CM | POA: Diagnosis not present

## 2022-11-05 DIAGNOSIS — Z0001 Encounter for general adult medical examination with abnormal findings: Secondary | ICD-10-CM

## 2022-11-05 DIAGNOSIS — N1831 Chronic kidney disease, stage 3a: Secondary | ICD-10-CM

## 2022-11-05 DIAGNOSIS — E119 Type 2 diabetes mellitus without complications: Secondary | ICD-10-CM | POA: Diagnosis not present

## 2022-11-05 DIAGNOSIS — I10 Essential (primary) hypertension: Secondary | ICD-10-CM

## 2022-11-05 DIAGNOSIS — E1165 Type 2 diabetes mellitus with hyperglycemia: Secondary | ICD-10-CM

## 2022-11-05 DIAGNOSIS — E559 Vitamin D deficiency, unspecified: Secondary | ICD-10-CM

## 2022-11-05 DIAGNOSIS — N183 Chronic kidney disease, stage 3 unspecified: Secondary | ICD-10-CM | POA: Diagnosis not present

## 2022-11-05 DIAGNOSIS — R42 Dizziness and giddiness: Secondary | ICD-10-CM

## 2022-11-05 DIAGNOSIS — E1122 Type 2 diabetes mellitus with diabetic chronic kidney disease: Secondary | ICD-10-CM | POA: Diagnosis not present

## 2022-11-05 DIAGNOSIS — E7800 Pure hypercholesterolemia, unspecified: Secondary | ICD-10-CM

## 2022-11-05 MED ORDER — PIOGLITAZONE HCL 30 MG PO TABS: 30.0000 mg | ORAL_TABLET | Freq: Every day | ORAL | 3 refills | Status: DC

## 2022-11-05 MED ORDER — OZEMPIC (1 MG/DOSE) 4 MG/3ML ~~LOC~~ SOPN: 1.0000 mg | PEN_INJECTOR | SUBCUTANEOUS | 3 refills | Status: DC

## 2022-11-05 MED ORDER — REPATHA SURECLICK 140 MG/ML ~~LOC~~ SOAJ: 140.0000 mg | SUBCUTANEOUS | 11 refills | Status: DC

## 2022-11-05 NOTE — Patient Instructions (Signed)
Ok to increase the ozempic to 1 mg weekly  Since your A1c was so good, we should decrease the actos to 30 mg per day to avoid lower sugars  Please continue all other medications as before, and refills have been done if requested., including the repatha since this is a new year  Please have the pharmacy call with any other refills you may need.  Please continue your efforts at being more active, low cholesterol diet, and weight control.  You are otherwise up to date with prevention measures today.  Please keep your appointments with your specialists as you may have planned  You will be contacted regarding the referral for: Carotid artery testing  Please make an Appointment to return in 6 months, or sooner if needed, also with Lab Appointment for testing done 3-5 days before at the [facility] (so this is for TWO appointments - please see the scheduling desk as you leave)

## 2022-11-05 NOTE — Progress Notes (Signed)
Patient ID: Keith [name], male   DOB: [birth year], 67 y.o.   MRN: [id number]         Chief Complaint:: wellness exam and 39mofollow up (Back pain)  , dm, hld, ckd 3a, htn, dizziness, umbilical hernia       HPI:  Keith ACKERMANNis a 67y.o. male here for wellness exam; for shingrix at pharmacy, pt to call for eye exam soon, o/w up to date                        Also BP at home has been controlled per pt.  Has no worsening pain to known umbilical hernia.  Repatha has been too expensive in 2023, but wiling to try again with current insurance.  Willing to incresae the ozempic for better wt loss as well.  Pt denies chest pain, increased sob or doe, wheezing, orthopnea, PND, increased LE swelling, palpitations, or syncope, but has dizziness mild intermittent but persistent mild for 2 wks,  Amlodipine was decreased to 5 mg per cardiology and was improved.     Wt Readings from Last 3 Encounters:  11/05/22 250 lb (113.4 kg)  09/12/22 250 lb 9.6 oz (113.7 kg)  04/22/22 249 lb (112.9 kg)   BP Readings from Last 3 Encounters:  11/05/22 136/80  09/12/22 132/78  04/22/22 128/70   Immunization History  Administered Date(s) Administered   Influenza Whole 11/08/2009   Influenza,inj,Quad PF,6+ Mos 05/12/2013, 08/31/2014, 05/06/2017, 06/24/2018, 05/19/2019   Influenza-Unspecified 07/19/2015, 06/24/2018   Pneumococcal Conjugate-13 10/26/2014   Pneumococcal Polysaccharide-23 05/12/2013, 04/22/2022   Td 09/15/1997, 11/02/2008   Tdap 05/19/2019   Health Maintenance Due  Topic Date Due   OPHTHALMOLOGY EXAM  09/05/2022      Past Medical History:  Diagnosis Date   Anxiety    Arthritis    Cervical spine pain    Chronic   Chronic HFimpEF (heart failure with improved ejection fraction) ([facility]    a. 01/2010 Echo: EF 30%, diff HK; b. 08/2014 Echo: EF 50%, no rwma, GrI DD.   Diabetes mellitus    Diverticulosis    Fatty liver    GERD (gastroesophageal reflux disease)    Hereditary hemochromatosis     Compound heterozygote. Pt gets periodic phlebotomies   History of colonic polyps    History of NICM (nonischemic cardiomyopathy) w/ subsequent improvement in LV function(HCC)    a. Echo (5/11) showed EF 30% with diffuse hypokinesis, normal wall thickness, mildly decreased RV systolic function. This may be a cardiomyopathy due to hemochromatosis versus tachy-mediated; b. 08/2014 Echo: EF 50%, no rwma, GrI DD.   Hyperlipidemia    Hypertension    Hypothyroidism    Low back pain    The pt is S/P spinal fusion surgeries   Multifocal atrial tachycardia    a. Holter monior in 5/11 showed frequent runs of symtomatic MAT with heart rate around 100-->prev on amio-->now on bb only.   Nonobstructive CAD (coronary artery disease)    a. 11/2008 MV: EF 50%, Inf HK, inferior scar and ischemia in the apical anterior septum and in the inferior wall; b. 11/2008 Cath: LM nl, LAD min irregs, LCX nl, OM1/2 nl, RCA nl.   Peptic ulcer disease    Thyroid nodule    Wandering atrial pacemaker    Past Surgical History:  Procedure Laterality Date   CORONARY STENT INTERVENTION [name] 09/06/2021   Procedure: CORONARY STENT INTERVENTION;  Surgeon: SBelva Crome MD;  [facility]  INVASIVE CV LAB;  Service: Cardiovascular;  Laterality: [name];   INGUINAL HERNIA REPAIR Right    LEFT HEART CATH AND CORONARY ANGIOGRAPHY [name] 09/06/2021   Procedure: LEFT HEART CATH AND CORONARY ANGIOGRAPHY;  Surgeon: Belva Crome, MD;  Location: [place] CV LAB;  Service: Cardiovascular;  Laterality: [name];   LUMBAR [place] SURGERY  09/15/1989   TONSILLECTOMY      reports that he quit smoking about 19 years ago. His smoking use included cigarettes. He has never used smokeless tobacco. He reports that he does not currently use alcohol. He reports that he does not currently use drugs. family history includes Alcohol abuse in his father; Colitis in his mother; Colon polyps in his mother; Diabetes in his mother, paternal grandfather, and paternal  grandmother; Hypertension in his father; Multiple sclerosis in his mother and sister. Allergies  Allergen Reactions   Dicyclomine Nausea And Vomiting and Other (See Comments)    Lightheaded, dizziness, SOB   Doxycycline Nausea Only    Weak, fatigue, "sick on stomach"   Lipitor [Atorvastatin]     myalgias   Penicillins Other (See Comments)    Childhood allergy - unknown reaction   Reglan [Metoclopramide] Other (See Comments)    Unknown reaction   Zocor [Simvastatin] Other (See Comments)    Myalgia    Zoloft [Sertraline Hcl] Other (See Comments)    Unknown reaction    Current Outpatient Medications on File Prior to Visit  Medication Sig Dispense Refill   Alcohol Swabs (B-D SINGLE USE SWABS REGULAR) PADS Use as directed once daily E11.9 100 each 3   amLODipine (NORVASC) 5 MG tablet Take 1 tablet (5 mg total) by mouth daily. 90 tablet 3   aspirin 81 MG EC tablet Take 81 mg by mouth daily.     Blood Glucose Calibration (TRUE METRIX LEVEL 1) Low SOLN Use as directed once daily E11.9 1 each 3   Blood Glucose Monitoring Suppl (TRUE METRIX AIR GLUCOSE METER) w/Device KIT USE AS DIRECTED 1 kit 0   Cholecalciferol (VITAMIN D-3) 25 MCG (1000 UT) CAPS Take 2,000 Units by mouth daily.     clopidogrel (PLAVIX) 75 MG tablet TAKE 1 TABLET EVERY DAY 90 tablet 10   DROPLET PEN NEEDLES 32G X 4 MM MISC      fenofibrate (TRICOR) 145 MG tablet TAKE 1 TABLET EVERY DAY 90 tablet 10   glipiZIDE (GLUCOTROL XL) 10 MG 24 hr tablet Take 1 tablet (10 mg total) by mouth daily with breakfast. 90 tablet 3   glucosamine-chondroitin 500-400 MG tablet Take 1 tablet by mouth daily.     glucose blood (RELION TRUE METRIX TEST STRIPS) test strip Use as instructed once daily E11.9 100 each 12   levothyroxine (SYNTHROID) 25 MCG tablet TAKE 1 TABLET EVERY DAY (NEED MD APPOINTMENT) 90 tablet 10   metFORMIN (GLUCOPHAGE-XR) 500 MG 24 hr tablet TAKE 4 TABLETS EVERY DAY WITH BREAKFAST 360 tablet 10   metoprolol succinate  (TOPROL-XL) 100 MG 24 hr tablet TAKE 1 TABLET TWICE DAILY. TAKE WITH OR IMMEDIATELY FOLLOWING A MEAL. 180 tablet 10   olmesartan (BENICAR) 40 MG tablet TAKE 1 TABLET EVERY DAY 90 tablet 10   Omega-3 Fatty Acids (FISH OIL PO) Take 1 tablet by mouth daily.      pantoprazole (PROTONIX) 40 MG tablet TAKE 1 TABLET EVERY DAY 90 tablet 10   TRUEplus Lancets 33G MISC Use as directed once daily E11.9 100 each 12   tadalafil (CIALIS) 20 MG tablet Take 1 tablet (20  mg total) by mouth daily as needed for erectile dysfunction. 30 tablet 11   No current facility-administered medications on file prior to visit.        ROS:  All others reviewed and negative.  Objective        PE:  BP 136/80 (BP Location: Right Arm, Patient Position: Sitting, Cuff Size: Large)   Pulse 70   Temp 98 F (36.7 C) (Oral)   Ht 5' 10.5" (1.791 m)   Wt 250 lb (113.4 kg)   SpO2 97%   BMI 35.36 kg/m                 Constitutional: Pt appears in NAD               HENT: Head: NCAT.                Right Ear: External ear normal.                 Left Ear: External ear normal.                Eyes: . Pupils are equal, round, and reactive to light. Conjunctivae and EOM are normal               Nose: without d/c or deformity               Neck: Neck supple. Gross normal ROM               Cardiovascular: Normal rate and regular rhythm.                 Pulmonary/Chest: Effort normal and breath sounds without rales or wheezing.                Abd:  Soft, NT, ND, + BS, no organomegaly               Neurological: Pt is alert. At baseline orientation, motor grossly intact               Skin: Skin is warm. No rashes, no other new lesions, LE edema - none               Psychiatric: Pt behavior is normal without agitation   Micro: none  Cardiac tracings I have personally interpreted today:  none  Pertinent Radiological findings (summarize): none   Lab Results  Component Value Date   WBC 7.4 10/31/2022   HGB 14.7 10/31/2022   HCT  43.1 10/31/2022   PLT 228.0 10/31/2022   GLUCOSE 115 (H) 10/31/2022   CHOL 183 10/31/2022   TRIG 138.0 10/31/2022   HDL 32.50 (L) 10/31/2022   LDLDIRECT 127.0 11/15/2019   LDLCALC 123 (H) 10/31/2022   ALT 15 10/31/2022   AST 14 10/31/2022   NA 137 10/31/2022   K 4.3 10/31/2022   CL 101 10/31/2022   CREATININE 1.32 10/31/2022   BUN 16 10/31/2022   CO2 28 10/31/2022   TSH 5.33 10/31/2022   PSA 1.08 10/31/2022   INR 1.1 (H) 07/09/2016   HGBA1C 6.4 10/31/2022   MICROALBUR <0.7 10/31/2022   Assessment/Plan:  ALVARO CURRIN is a 68 y.o. White or Caucasian [1] male with  has a past medical history of Anxiety, Arthritis, Cervical spine pain, Chronic HFimpEF (heart failure with improved ejection fraction) ([place]), Diabetes mellitus, Diverticulosis, Fatty liver, GERD (gastroesophageal reflux disease), Hereditary hemochromatosis, History of colonic polyps, History of NICM (nonischemic cardiomyopathy) w/ subsequent improvement in LV function(HCC), Hyperlipidemia,  Hypertension, Hypothyroidism, Low back pain, Multifocal atrial tachycardia, Nonobstructive CAD (coronary artery disease), Peptic ulcer disease, Thyroid nodule, and Wandering atrial pacemaker.  Encounter for well adult exam with abnormal findings Age and sex appropriate education and counseling updated with regular exercise and diet Referrals for preventative services - pt to call for eye exam soon Immunizations addressed - for shingrix at pharmacy Smoking counseling  - none needed Evidence for depression or other mood disorder - none significant Most recent labs reviewed. I have personally reviewed and have noted: 1) the patient's medical and social history 2) The patient's current medications and supplements 3) The patient's height, weight, and BMI have been recorded in the chart   CKD (chronic kidney disease) stage 3, GFR 30-59 ml/min (HCC) Lab Results  Component Value Date   CREATININE 1.32 10/31/2022   Stable overall,  cont to avoid nephrotoxins   Essential hypertension BP Readings from Last 3 Encounters:  11/05/22 136/80  09/12/22 132/78  04/22/22 128/70   Stable, pt to continue medical treatment norvasc 5 mg qd, benicar 40 mg qd   Hyperlipidemia Lab Results  Component Value Date   LDLCALC 123 (H) 10/31/2022   uncontrolled, pt to continue current tricor 145 mg qd, and start repatha asd   Insulin dependent type 2 diabetes mellitus, controlled ([place]) Lab Results  Component Value Date   HGBA1C 6.4 10/31/2022   Stable, pt to continue current medical treatment glipziide ER 10 mg qd, meformin ER 500 mg - 4 qd but decrease the acto to 30 mg and increase the ozempic to 1 mg weekly for better wt loss   Dizziness Etiology unclear, for labs as ordered, also carotid artery u/s  Followup: Return in about 6 months (around 05/06/2023).  Cathlean Cower, MD 11/11/2022 9:21 PM [facility]

## 2022-11-11 ENCOUNTER — Encounter: Payer: Self-pay | Admitting: Internal Medicine

## 2022-11-11 DIAGNOSIS — N183 Chronic kidney disease, stage 3 unspecified: Secondary | ICD-10-CM | POA: Insufficient documentation

## 2022-11-11 DIAGNOSIS — R42 Dizziness and giddiness: Secondary | ICD-10-CM | POA: Insufficient documentation

## 2022-11-11 NOTE — Assessment & Plan Note (Signed)
Age and sex appropriate education and counseling updated with regular exercise and diet Referrals for preventative services - pt to call for eye exam soon Immunizations addressed - for shingrix at pharmacy Smoking counseling  - none needed Evidence for depression or other mood disorder - none significant Most recent labs reviewed. I have personally reviewed and have noted: 1) the patient's medical and social history 2) The patient's current medications and supplements 3) The patient's height, weight, and BMI have been recorded in the chart

## 2022-11-11 NOTE — Assessment & Plan Note (Signed)
Lab Results  Component Value Date   LDLCALC 123 (H) 10/31/2022   uncontrolled, pt to continue current tricor 145 mg qd, and start repatha asd

## 2022-11-11 NOTE — Assessment & Plan Note (Signed)
Lab Results  Component Value Date   HGBA1C 6.4 10/31/2022   Stable, pt to continue current medical treatment glipziide ER 10 mg qd, meformin ER 500 mg - 4 qd but decrease the acto to 30 mg and increase the ozempic to 1 mg weekly for better wt loss

## 2022-11-11 NOTE — Assessment & Plan Note (Signed)
Etiology unclear, for labs as ordered, also carotid artery u/s

## 2022-11-11 NOTE — Assessment & Plan Note (Signed)
Lab Results  Component Value Date   CREATININE 1.32 10/31/2022   Stable overall, cont to avoid nephrotoxins

## 2022-11-11 NOTE — Assessment & Plan Note (Signed)
BP Readings from Last 3 Encounters:  11/05/22 136/80  09/12/22 132/78  04/22/22 128/70   Stable, pt to continue medical treatment norvasc 5 mg qd, benicar 40 mg qd

## 2022-11-17 ENCOUNTER — Ambulatory Visit (HOSPITAL_COMMUNITY)
Admission: RE | Admit: 2022-11-17 | Discharge: 2022-11-17 | Disposition: A | Discharge status: Home / Self Care | Payer: Medicare HMO | Source: Ambulatory Visit | Attending: Cardiovascular Disease | Admitting: Cardiovascular Disease

## 2022-11-17 DIAGNOSIS — R42 Dizziness and giddiness: Secondary | ICD-10-CM | POA: Diagnosis not present

## 2022-12-27 ENCOUNTER — Other Ambulatory Visit: Payer: Self-pay | Admitting: Internal Medicine

## 2022-12-27 ENCOUNTER — Other Ambulatory Visit (HOSPITAL_BASED_OUTPATIENT_CLINIC_OR_DEPARTMENT_OTHER): Payer: Self-pay | Admitting: Family

## 2022-12-27 DIAGNOSIS — I10 Essential (primary) hypertension: Secondary | ICD-10-CM

## 2022-12-29 NOTE — Telephone Encounter (Signed)
Rx(s) sent to pharmacy electronically.  

## 2023-01-12 DIAGNOSIS — N401 Enlarged prostate with lower urinary tract symptoms: Secondary | ICD-10-CM | POA: Diagnosis not present

## 2023-01-12 DIAGNOSIS — R3912 Poor urinary stream: Secondary | ICD-10-CM | POA: Diagnosis not present

## 2023-03-10 ENCOUNTER — Ambulatory Visit (INDEPENDENT_AMBULATORY_CARE_PROVIDER_SITE_OTHER): Payer: Medicare HMO

## 2023-03-10 DIAGNOSIS — Z0000 Encounter for general adult medical examination without abnormal findings: Secondary | ICD-10-CM | POA: Diagnosis not present

## 2023-03-10 NOTE — Patient Instructions (Signed)
Health Maintenance, Male Adopting a healthy lifestyle and getting preventive care are important in promoting health and wellness. Ask your health care provider about: The right schedule for you to have regular tests and exams. Things you can do on your own to prevent diseases and keep yourself healthy. What should I know about diet, weight, and exercise? Eat a healthy diet  Eat a diet that includes plenty of vegetables, fruits, low-fat dairy products, and lean protein. Do not eat a lot of foods that are high in solid fats, added sugars, or sodium. Maintain a healthy weight Body mass index (BMI) is a measurement that can be used to identify possible weight problems. It estimates body fat based on height and weight. Your health care provider can help determine your BMI and help you achieve or maintain a healthy weight. Get regular exercise Get regular exercise. This is one of the most important things you can do for your health. Most adults should: Exercise for at least 150 minutes each week. The exercise should increase your heart rate and make you sweat (moderate-intensity exercise). Do strengthening exercises at least twice a week. This is in addition to the moderate-intensity exercise. Spend less time sitting. Even light physical activity can be beneficial. Watch cholesterol and blood lipids Have your blood tested for lipids and cholesterol at 66 years of age, then have this test every 5 years. You may need to have your cholesterol levels checked more often if: Your lipid or cholesterol levels are high. You are older than 66 years of age. You are at high risk for heart disease. What should I know about cancer screening? Many types of cancers can be detected early and may often be prevented. Depending on your health history and family history, you may need to have cancer screening at various ages. This may include screening for: Colorectal cancer. Prostate cancer. Skin cancer. Lung  cancer. What should I know about heart disease, diabetes, and high blood pressure? Blood pressure and heart disease High blood pressure causes heart disease and increases the risk of stroke. This is more likely to develop in people who have high blood pressure readings or are overweight. Talk with your health care provider about your target blood pressure readings. Have your blood pressure checked: Every 3-5 years if you are 18-39 years of age. Every year if you are 40 years old or older. If you are between the ages of 65 and 75 and are a current or former smoker, ask your health care provider if you should have a one-time screening for abdominal aortic aneurysm (AAA). Diabetes Have regular diabetes screenings. This checks your fasting blood sugar level. Have the screening done: Once every three years after age 45 if you are at a normal weight and have a low risk for diabetes. More often and at a younger age if you are overweight or have a high risk for diabetes. What should I know about preventing infection? Hepatitis B If you have a higher risk for hepatitis B, you should be screened for this virus. Talk with your health care provider to find out if you are at risk for hepatitis B infection. Hepatitis C Blood testing is recommended for: Everyone born from 1945 through 1965. Anyone with known risk factors for hepatitis C. Sexually transmitted infections (STIs) You should be screened each year for STIs, including gonorrhea and chlamydia, if: You are sexually active and are younger than 66 years of age. You are older than 66 years of age and your   health care provider tells you that you are at risk for this type of infection. Your sexual activity has changed since you were last screened, and you are at increased risk for chlamydia or gonorrhea. Ask your health care provider if you are at risk. Ask your health care provider about whether you are at high risk for HIV. Your health care provider  may recommend a prescription medicine to help prevent HIV infection. If you choose to take medicine to prevent HIV, you should first get tested for HIV. You should then be tested every 3 months for as long as you are taking the medicine. Follow these instructions at home: Alcohol use Do not drink alcohol if your health care provider tells you not to drink. If you drink alcohol: Limit how much you have to 0-2 drinks a day. Know how much alcohol is in your drink. In the [place] one drink equals one 12 oz bottle of beer (355 mL), one 5 oz glass of wine (148 mL), or one 1 oz glass of hard liquor (44 mL). Lifestyle Do not use any products that contain nicotine or tobacco. These products include cigarettes, chewing tobacco, and vaping devices, such as e-cigarettes. If you need help quitting, ask your health care provider. Do not use street drugs. Do not share needles. Ask your health care provider for help if you need support or information about quitting drugs. General instructions Schedule regular health, dental, and eye exams. Stay current with your vaccines. Tell your health care provider if: You often feel depressed. You have ever been abused or do not feel safe at home. Summary Adopting a healthy lifestyle and getting preventive care are important in promoting health and wellness. Follow your health care provider's instructions about healthy diet, exercising, and getting tested or screened for diseases. Follow your health care provider's instructions on monitoring your cholesterol and blood pressure. This information is not intended to replace advice given to you by your health care provider. Make sure you discuss any questions you have with your health care provider. Document Revised: 01/21/2021 Document Reviewed: 01/21/2021 Elsevier Patient Education  2024 [employer] Advance Directive  Advance directives are legal documents that allow you to make decisions about your health care and  medical treatment in case you become unable to communicate for yourself. Advance directives let your wishes be known to family, friends, and health care providers. Discussing and writing advance directives should happen over time rather than all at once. Advance directives can be changed and updated at any time. There are different types of advance directives, such as: Medical power of attorney. Living will. Do not resuscitate (DNR) order or do not attempt resuscitation (DNAR) order. Health care proxy and medical power of attorney A health care proxy is also called a health care agent. This person is appointed to make medical decisions for you when you are unable to make decisions for yourself. Generally, people ask a trusted friend or family member to act as their proxy and represent their preferences. Make sure you have an agreement with your trusted person to act as your proxy. A proxy may have to make a medical decision on your behalf if your wishes are not known. A medical power of attorney, also called a durable power of attorney for health care, is a legal document that names your health care proxy. Depending on the laws in your state, the document may need to be: Signed. Notarized. Dated. Copied. Witnessed. Incorporated into your medical record. You may also   want to appoint a trusted person to manage your money in the event you are unable to do so. This is called a durable power of attorney for finances. It is a separate legal document from the durable power of attorney for health care. You may choose your health care proxy or someone different to act as your agent in money matters. If you do not appoint a proxy, or there is a concern that the proxy is not acting in your best interest, a court may appoint a guardian to act on your behalf. Living will A living will is a set of instructions that state your wishes about medical care when you cannot express them yourself. Health care providers  should keep a copy of your living will in your medical record. You may want to give a copy to family members or friends. To alert caregivers in case of an emergency, you can place a card in your wallet to let them know that you have a living will and where they can find it. A living will is used if you become: Terminally ill. Disabled. Unable to communicate or make decisions. The following decisions should be included in your living will: To use or not to use life support equipment, such as dialysis machines and breathing machines (ventilators). Whether you want a DNR or DNAR order. This tells health care providers not to use cardiopulmonary resuscitation (CPR) if breathing or heartbeat stops. To use or not to use tube feeding. To be given or not to be given food and fluids. Whether you want comfort (palliative) care when the goal becomes comfort rather than a cure. Whether you want to donate your organs and tissues. A living will does not give instructions for distributing your money and property if you should pass away. DNR or DNAR A DNR or DNAR order is a request not to have CPR in the event that your heart stops beating or you stop breathing. If a DNR or DNAR order has not been made and shared, a health care provider will try to help any patient whose heart has stopped or who has stopped breathing. If you plan to have surgery, talk with your health care provider about how your DNR or DNAR order will be followed if problems occur. What if I do not have an advance directive? Some states assign family decision makers to act on your behalf if you do not have an advance directive. Each state has its own laws about advance directives. You may want to check with your health care provider, attorney, or state representative about the laws in your state. Summary Advance directives are legal documents that allow you to make decisions about your health care and medical treatment in case you become unable to  communicate for yourself. The process of discussing and writing advance directives should happen over time. You can change and update advance directives at any time. Advance directives may include a medical power of attorney, a living will, and a DNR or DNAR order. This information is not intended to replace advice given to you by your health care provider. Make sure you discuss any questions you have with your health care provider. Document Revised: 06/05/2020 Document Reviewed: 06/05/2020 Elsevier Patient Education  2024 [employer].  

## 2023-03-10 NOTE — Progress Notes (Signed)
Subjective:   Keith [name] is a 67 y.o. male who presents for Medicare Annual/Subsequent preventive examination.  Visit Complete: Virtual  I connected with  Keith [name] on 03/10/23 by a audio enabled telemedicine application and verified that I am speaking with the correct person using two identifiers.  Patient Location: Home  Provider Location: Home Office  I discussed the limitations of evaluation and management by telemedicine. The patient expressed understanding and agreed to proceed.  Patient Medicare AWV questionnaire was completed by the patient on 03/10/2023; I have confirmed that all information answered by patient is correct and no changes since this date.     Objective:    There were no vitals filed for this visit. There is no height or weight on file to calculate BMI.     04/03/2022   11:52 AM 09/05/2021   10:15 PM 05/22/2020    8:44 AM 11/11/2017   10:08 AM  Advanced Directives  Does Patient Have a Medical Advance Directive? No No No No  Would patient like information on creating a medical advance directive? No - Patient declined  No - Patient declined No - Patient declined    Current Medications (verified) Outpatient Encounter Medications as of 03/10/2023  Medication Sig   Alcohol Swabs (B-D SINGLE USE SWABS REGULAR) PADS Use as directed once daily E11.9   amLODipine (NORVASC) 5 MG tablet TAKE 1 TABLET EVERY DAY   aspirin 81 MG EC tablet Take 81 mg by mouth daily.   Blood Glucose Calibration (TRUE METRIX LEVEL 1) Low SOLN Use as directed once daily E11.9   Blood Glucose Monitoring Suppl (TRUE METRIX AIR GLUCOSE METER) w/Device KIT USE AS DIRECTED   Cholecalciferol (VITAMIN D-3) 25 MCG (1000 UT) CAPS Take 2,000 Units by mouth daily.   clopidogrel (PLAVIX) 75 MG tablet TAKE 1 TABLET EVERY DAY   DROPLET PEN NEEDLES 32G X 4 MM MISC    Evolocumab (REPATHA SURECLICK) 140 MG/ML SOAJ Inject 140 mg into the skin every 14 (fourteen) days.   fenofibrate  (TRICOR) 145 MG tablet TAKE 1 TABLET EVERY DAY   glipiZIDE (GLUCOTROL XL) 10 MG 24 hr tablet TAKE 1 TABLET EVERY DAY WITH BREAKFAST   glucosamine-chondroitin 500-400 MG tablet Take 1 tablet by mouth daily.   glucose blood (RELION TRUE METRIX TEST STRIPS) test strip Use as instructed once daily E11.9   levothyroxine (SYNTHROID) 25 MCG tablet TAKE 1 TABLET EVERY DAY (NEED MD APPOINTMENT)   metFORMIN (GLUCOPHAGE-XR) 500 MG 24 hr tablet TAKE 4 TABLETS EVERY DAY WITH BREAKFAST   metoprolol succinate (TOPROL-XL) 100 MG 24 hr tablet TAKE 1 TABLET TWICE DAILY. TAKE WITH OR IMMEDIATELY FOLLOWING A MEAL.   olmesartan (BENICAR) 40 MG tablet TAKE 1 TABLET EVERY DAY   Omega-3 Fatty Acids (FISH OIL PO) Take 1 tablet by mouth daily.    pantoprazole (PROTONIX) 40 MG tablet TAKE 1 TABLET EVERY DAY   pioglitazone (ACTOS) 30 MG tablet Take 1 tablet (30 mg total) by mouth daily.   Semaglutide, 1 MG/DOSE, (OZEMPIC, 1 MG/DOSE,) 4 MG/3ML SOPN Inject 1 mg into the skin once a week.   tadalafil (CIALIS) 20 MG tablet Take 1 tablet (20 mg total) by mouth daily as needed for erectile dysfunction.   TRUEplus Lancets 33G MISC Use as directed once daily E11.9   No facility-administered encounter medications on file as of 03/10/2023.    Allergies (verified) Dicyclomine, Doxycycline, Lipitor [atorvastatin], Penicillins, Reglan [metoclopramide], Zocor [simvastatin], and Zoloft [sertraline hcl]   History: Past Medical  History:  Diagnosis Date   Anxiety    Arthritis    Cervical spine pain    Chronic   Chronic HFimpEF (heart failure with improved ejection fraction) (HCC)    a. 01/2010 Echo: EF 30%, diff HK; b. 08/2014 Echo: EF 50%, no rwma, GrI DD.   Diabetes mellitus    Diverticulosis    Fatty liver    GERD (gastroesophageal reflux disease)    Hereditary hemochromatosis    Compound heterozygote. Pt gets periodic phlebotomies   History of colonic polyps    History of NICM (nonischemic cardiomyopathy) w/ subsequent  improvement in LV function(HCC)    a. Echo (5/11) showed EF 30% with diffuse hypokinesis, normal wall thickness, mildly decreased RV systolic function. This may be a cardiomyopathy due to hemochromatosis versus tachy-mediated; b. 08/2014 Echo: EF 50%, no rwma, GrI DD.   Hyperlipidemia    Hypertension    Hypothyroidism    Low back pain    The pt is S/P spinal fusion surgeries   Multifocal atrial tachycardia    a. Holter monior in 5/11 showed frequent runs of symtomatic MAT with heart rate around 100-->prev on amio-->now on bb only.   Nonobstructive CAD (coronary artery disease)    a. 11/2008 MV: EF 50%, Inf HK, inferior scar and ischemia in the apical anterior septum and in the inferior wall; b. 11/2008 Cath: LM nl, LAD min irregs, LCX nl, OM1/2 nl, RCA nl.   Peptic ulcer disease    Thyroid nodule    Wandering atrial pacemaker    Past Surgical History:  Procedure Laterality Date   CORONARY STENT INTERVENTION [name] 09/06/2021   Procedure: CORONARY STENT INTERVENTION;  Surgeon: Keith Records, MD;  Location: [facility];  Service: Cardiovascular;  Laterality: [name];   INGUINAL HERNIA REPAIR Right    LEFT HEART CATH AND CORONARY ANGIOGRAPHY [name] 09/06/2021   Procedure: LEFT HEART CATH AND CORONARY ANGIOGRAPHY;  Surgeon: Keith Records, MD;  Location: [facility];  Service: Cardiovascular;  Laterality: [name];   LUMBAR DISC SURGERY  09/15/1989   TONSILLECTOMY     Family History  Problem Relation Age of Onset   Colon polyps Mother    Multiple sclerosis Mother    Diabetes Mother    Colitis Mother    Hypertension Father    Alcohol abuse Father    Multiple sclerosis Sister    Diabetes Paternal Grandmother    Diabetes Paternal Grandfather    Colon cancer Neg Hx    Social History   Socioeconomic History   Marital status: Married    Spouse name: Not on file   Number of children: 3   Years of education: Not on file   Highest education level: Not on file  Occupational History    Occupation: Disabled    Employer: UNEMPLOYED  Tobacco Use   Smoking status: Former    Types: Cigarettes    Quit date: 09/16/2003    Years since quitting: 19.4   Smokeless tobacco: Never  Vaping Use   Vaping Use: Never used  Substance and Sexual Activity   Alcohol use: Not Currently    Comment: social - 1 drink every few months.   Drug use: Not Currently    Comment: Quit in 1980   Sexual activity: Yes  Other Topics Concern   Not on file  Social History Narrative   Lives [place] by himself.  Does not routinely exercise.   Social Determinants of Health   Financial Resource Strain:  Low Risk  (04/03/2022)   Overall Financial Resource Strain (CARDIA)    Difficulty of Paying Living Expenses: Not hard at all  Food Insecurity: No Food Insecurity (04/03/2022)   Hunger Vital Sign    Worried About Running Out of Food in the Last Year: Never true    Ran Out of Food in the Last Year: Never true  Transportation Needs: No Transportation Needs (04/03/2022)   PRAPARE - Administrator, Civil Service (Medical): No    Lack of Transportation (Non-Medical): No  Physical Activity: Inactive (04/03/2022)   Exercise Vital Sign    Days of Exercise per Week: 0 days    Minutes of Exercise per Session: 0 min  Stress: No Stress Concern Present (04/03/2022)   [employer] of Occupational Health - Occupational Stress Questionnaire    Feeling of Stress : Not at all  Social Connections: Unknown (04/03/2022)   Social Connection and Isolation Panel [NHANES]    Frequency of Communication with Friends and Family: More than three times a week    Frequency of Social Gatherings with Friends and Family: More than three times a week    Attends Religious Services: 1 to 4 times per year    Active Member of [employer] or Organizations: No    Attends Banker Meetings: 1 to 4 times per year    Marital Status: Patient declined    Tobacco Counseling Counseling given: Not Answered   Clinical  Intake:                        Activities of Daily Living    04/03/2022   11:52 AM  In your present state of health, do you have any difficulty performing the following activities:  Hearing? 0  Vision? 0  Difficulty concentrating or making decisions? 1  Walking or climbing stairs? 0  Dressing or bathing? 0  Doing errands, shopping? 0  Preparing Food and eating ? N  Using the Toilet? N  In the past six months, have you accidently leaked urine? N  Do you have problems with loss of bowel control? N  Managing your Medications? N  Managing your Finances? N  Housekeeping or managing your Housekeeping? N    Patient Care Team: Keith Levins, MD as PCP - General Keith Som Madolyn Frieze, MD as PCP - Cardiology (Cardiology)  Indicate any recent Medical Services you may have received from other than [facility] providers in the past year (date may be approximate).     Assessment:   This is a routine wellness examination for Keith [name].  Hearing/Vision screen No results found.  Dietary issues and exercise activities discussed:     Goals Addressed   None   Depression Screen    11/05/2022    8:42 AM 04/22/2022    9:58 AM 04/03/2022   11:38 AM 10/22/2021    9:46 AM 10/22/2021    9:03 AM 10/12/2020    9:22 AM 05/22/2020    8:43 AM  PHQ 2/9 Scores  PHQ - 2 Score 0 0 0 0 0 0 0  PHQ- 9 Score 0 5         Fall Risk    11/05/2022    8:43 AM 04/22/2022    9:58 AM 04/03/2022   11:36 AM 10/22/2021    9:46 AM 10/22/2021    9:03 AM  Fall Risk   Falls in the past year? 0 0 0 0 0  Number falls in past yr:  0 0 0 0 0  Injury with Fall? 0 0 0 0 0  Risk for fall due to : No Fall Risks No Fall Risks No Fall Risks    Follow up Falls evaluation completed Falls evaluation completed Falls evaluation completed      MEDICARE RISK AT HOME:   TIMED UP AND GO:  Was the test performed?  No    Cognitive Function:        04/03/2022   11:53 AM 05/22/2020    8:46 AM  6CIT Screen  What Year? 0 points 0  points  What month? 0 points 0 points  What time? 0 points 0 points  Count back from 20 0 points 0 points  Months in reverse 0 points 0 points  Repeat phrase 0 points 0 points  Total Score 0 points 0 points    Immunizations Immunization History  Administered Date(s) Administered   Influenza Whole 11/08/2009   Influenza,inj,Quad PF,6+ Mos 05/12/2013, 08/31/2014, 05/06/2017, 06/24/2018, 05/19/2019   Influenza-Unspecified 07/19/2015, 06/24/2018   Pneumococcal Conjugate-13 10/26/2014   Pneumococcal Polysaccharide-23 05/12/2013, 04/22/2022   Td 09/15/1997, 11/02/2008   Tdap 05/19/2019    TDAP status: Up to date  Flu Vaccine status: Declined, Education has been provided regarding the importance of this vaccine but patient still declined. Advised may receive this vaccine at local pharmacy or Health Dept. Aware to provide a copy of the vaccination record if obtained from local pharmacy or Health Dept. Verbalized acceptance and understanding.  Pneumococcal vaccine status: Up to date  Covid-19 vaccine status: Declined, Education has been provided regarding the importance of this vaccine but patient still declined. Advised may receive this vaccine at local pharmacy or Health Dept.or vaccine clinic. Aware to provide a copy of the vaccination record if obtained from local pharmacy or Health Dept. Verbalized acceptance and understanding.  Qualifies for Shingles Vaccine? Yes   Zostavax completed No   Shingrix Completed?: No.    Education has been provided regarding the importance of this vaccine. Patient has been advised to call insurance company to determine out of pocket expense if they have not yet received this vaccine. Advised may also receive vaccine at local pharmacy or Health Dept. Verbalized acceptance and understanding.  Screening Tests Health Maintenance  Topic Date Due   Zoster Vaccines- Shingrix (1 of 2) Never done   OPHTHALMOLOGY EXAM  09/05/2022   INFLUENZA VACCINE  04/16/2023    HEMOGLOBIN A1C  05/01/2023   Diabetic kidney evaluation - eGFR measurement  11/01/2023   Diabetic kidney evaluation - Urine ACR  11/01/2023   FOOT EXAM  11/06/2023   Medicare Annual Wellness (AWV)  03/09/2024   Colonoscopy  08/01/2028   DTaP/Tdap/Td (4 - Td or Tdap) 05/18/2029   Pneumonia Vaccine 60+ Years old  Completed   Hepatitis C Screening  Completed   HIV Screening  Completed   HPV VACCINES  Aged Out   COVID-19 Vaccine  Discontinued    Health Maintenance  Health Maintenance Due  Topic Date Due   Zoster Vaccines- Shingrix (1 of 2) Never done   OPHTHALMOLOGY EXAM  09/05/2022    Colorectal cancer screening: Type of screening: Colonoscopy. Completed 08/01/2021. Repeat every 7 years  Lung Cancer Screening: (Low Dose CT Chest recommended if Age 20-80 years, 20 pack-year currently smoking OR have quit w/in 15years.) does not qualify.   Lung Cancer Screening Referral: na  Additional Screening:  Hepatitis C Screening: does qualify; Completed yes  Vision Screening: Recommended annual ophthalmology exams for early detection of  glaucoma and other disorders of the eye. Is the patient up to date with their annual eye exam?  Yes  Who is the provider or what is the name of the office in which the patient attends annual eye exams? [email or web address] If pt is not established with a provider, would they like to be referred to a provider to establish care?  establish .   Dental Screening: Recommended annual dental exams for proper oral hygiene  Diabetic Foot Exam: Diabetic Foot Exam: Completed 20/21/2024  Community Resource Referral / Chronic Care Management: CRR required this visit?  No   CCM required this visit?  No     Plan:     I have personally reviewed and noted the following in the patient's chart:   Medical and social history Use of alcohol, tobacco or illicit drugs  Current medications and supplements including opioid prescriptions. Patient is not currently taking opioid  prescriptions. Functional ability and status Nutritional status Physical activity Advanced directives List of other physicians Hospitalizations, surgeries, and ER visits in previous 12 months Vitals Screenings to include cognitive, depression, and falls Referrals and appointments  In addition, I have reviewed and discussed with patient certain preventive protocols, quality metrics, and best practice recommendations. A written personalized care plan for preventive services as well as general preventive health recommendations were provided to patient.     Delana Meyer   03/10/2023   After Visit Summary: (MyChart) Due to this being a telephonic visit, the after visit summary with patients personalized plan was offered to patient via MyChart   Nurse Notes:

## 2023-03-12 NOTE — Progress Notes (Signed)
Subjective:   Keith [name] is a 67 y.o. male who presents for Medicare Annual/Subsequent preventive examination.  Visit Complete: Virtual  I connected with  Keith [name] on 03/10/2023 by a audio enabled telemedicine application and verified that I am speaking with the correct person using two identifiers.  Patient Location: Home  Provider Location: Home Office  I discussed the limitations of evaluation and management by telemedicine. The patient expressed understanding and agreed to proceed.  Patient Medicare AWV questionnaire was completed by the patient on 03/10/2023; I have confirmed that all information answered by patient is correct and no changes since this date.     Objective:    Today's Vitals   03/10/23 0917  PainSc: 4    There is no height or weight on file to calculate BMI.     03/10/2023    9:33 AM 04/03/2022   11:52 AM 09/05/2021   10:15 PM 05/22/2020    8:44 AM 11/11/2017   10:08 AM  Advanced Directives  Does Patient Have a Medical Advance Directive? No No No No No  Would patient like information on creating a medical advance directive? Yes (ED - Information included in AVS) No - Patient declined  No - Patient declined No - Patient declined    Current Medications (verified) Outpatient Encounter Medications as of 03/10/2023  Medication Sig   Alcohol Swabs (B-D SINGLE USE SWABS REGULAR) PADS Use as directed once daily E11.9   aspirin 81 MG EC tablet Take 81 mg by mouth daily.   Blood Glucose Calibration (TRUE METRIX LEVEL 1) Low SOLN Use as directed once daily E11.9   Blood Glucose Monitoring Suppl (TRUE METRIX AIR GLUCOSE METER) w/Device KIT USE AS DIRECTED   Cholecalciferol (VITAMIN D-3) 25 MCG (1000 UT) CAPS Take 2,000 Units by mouth daily.   clopidogrel (PLAVIX) 75 MG tablet TAKE 1 TABLET EVERY DAY   DROPLET PEN NEEDLES 32G X 4 MM MISC    fenofibrate (TRICOR) 145 MG tablet TAKE 1 TABLET EVERY DAY   glipiZIDE (GLUCOTROL XL) 10 MG 24 hr tablet TAKE  1 TABLET EVERY DAY WITH BREAKFAST   glucosamine-chondroitin 500-400 MG tablet Take 1 tablet by mouth daily.   glucose blood (RELION TRUE METRIX TEST STRIPS) test strip Use as instructed once daily E11.9   levothyroxine (SYNTHROID) 25 MCG tablet TAKE 1 TABLET EVERY DAY (NEED MD APPOINTMENT)   metFORMIN (GLUCOPHAGE-XR) 500 MG 24 hr tablet TAKE 4 TABLETS EVERY DAY WITH BREAKFAST   metoprolol succinate (TOPROL-XL) 100 MG 24 hr tablet TAKE 1 TABLET TWICE DAILY. TAKE WITH OR IMMEDIATELY FOLLOWING A MEAL.   olmesartan (BENICAR) 40 MG tablet TAKE 1 TABLET EVERY DAY   Omega-3 Fatty Acids (FISH OIL PO) Take 1 tablet by mouth daily.    pantoprazole (PROTONIX) 40 MG tablet TAKE 1 TABLET EVERY DAY   pioglitazone (ACTOS) 30 MG tablet Take 1 tablet (30 mg total) by mouth daily.   Semaglutide, 1 MG/DOSE, (OZEMPIC, 1 MG/DOSE,) 4 MG/3ML SOPN Inject 1 mg into the skin once a week.   tadalafil (CIALIS) 20 MG tablet Take 1 tablet (20 mg total) by mouth daily as needed for erectile dysfunction.   TRUEplus Lancets 33G MISC Use as directed once daily E11.9   amLODipine (NORVASC) 5 MG tablet TAKE 1 TABLET EVERY DAY (Patient not taking: Reported on 03/10/2023)   Evolocumab (REPATHA SURECLICK) 140 MG/ML SOAJ Inject 140 mg into the skin every 14 (fourteen) days. (Patient not taking: Reported on 03/10/2023)   No facility-administered  encounter medications on file as of 03/10/2023.    Allergies (verified) Dicyclomine, Doxycycline, Lipitor [atorvastatin], Penicillins, Reglan [metoclopramide], Zocor [simvastatin], and Zoloft [sertraline hcl]   History: Past Medical History:  Diagnosis Date   Anxiety    Arthritis    Cervical spine pain    Chronic   Chronic HFimpEF (heart failure with improved ejection fraction) (HCC)    a. 01/2010 Echo: EF 30%, diff HK; b. 08/2014 Echo: EF 50%, no rwma, GrI DD.   Diabetes mellitus    Diverticulosis    Fatty liver    GERD (gastroesophageal reflux disease)    Hereditary hemochromatosis     Compound heterozygote. Pt gets periodic phlebotomies   History of colonic polyps    History of NICM (nonischemic cardiomyopathy) w/ subsequent improvement in LV function(HCC)    a. Echo (5/11) showed EF 30% with diffuse hypokinesis, normal wall thickness, mildly decreased RV systolic function. This may be a cardiomyopathy due to hemochromatosis versus tachy-mediated; b. 08/2014 Echo: EF 50%, no rwma, GrI DD.   Hyperlipidemia    Hypertension    Hypothyroidism    Low back pain    The pt is S/P spinal fusion surgeries   Multifocal atrial tachycardia    a. Holter monior in 5/11 showed frequent runs of symtomatic MAT with heart rate around 100-->prev on amio-->now on bb only.   Nonobstructive CAD (coronary artery disease)    a. 11/2008 MV: EF 50%, Inf HK, inferior scar and ischemia in the apical anterior septum and in the inferior wall; b. 11/2008 Cath: LM nl, LAD min irregs, LCX nl, OM1/2 nl, RCA nl.   Peptic ulcer disease    Thyroid nodule    Wandering atrial pacemaker    Past Surgical History:  Procedure Laterality Date   CORONARY STENT INTERVENTION [name] 09/06/2021   Procedure: CORONARY STENT INTERVENTION;  Surgeon: Lyn Records, MD;  Location: [facility];  Service: Cardiovascular;  Laterality: [name];   INGUINAL HERNIA REPAIR Right    LEFT HEART CATH AND CORONARY ANGIOGRAPHY [name] 09/06/2021   Procedure: LEFT HEART CATH AND CORONARY ANGIOGRAPHY;  Surgeon: Lyn Records, MD;  Location: [facility];  Service: Cardiovascular;  Laterality: [name];   LUMBAR DISC SURGERY  09/15/1989   TONSILLECTOMY     Family History  Problem Relation Age of Onset   Colon polyps Mother    Multiple sclerosis Mother    Diabetes Mother    Colitis Mother    Hypertension Father    Alcohol abuse Father    Multiple sclerosis Sister    Diabetes Paternal Grandmother    Diabetes Paternal Grandfather    Colon cancer Neg Hx    Social History   Socioeconomic History   Marital status: Married     Spouse name: Not on file   Number of children: 3   Years of education: Not on file   Highest education level: Not on file  Occupational History   Occupation: Disabled    Employer: UNEMPLOYED  Tobacco Use   Smoking status: Former    Types: Cigarettes    Quit date: 09/16/2003    Years since quitting: 19.4   Smokeless tobacco: Never  Vaping Use   Vaping Use: Never used  Substance and Sexual Activity   Alcohol use: Not Currently    Comment: social - 1 drink every few months.   Drug use: Not Currently    Comment: Quit in 1980   Sexual activity: Yes  Other Topics Concern   Not  on file  Social History Narrative   Lives in [place] by himself.  Does not routinely exercise.   Social Determinants of Health   Financial Resource Strain: Low Risk  (03/10/2023)   Overall Financial Resource Strain (CARDIA)    Difficulty of Paying Living Expenses: Not hard at all  Food Insecurity: No Food Insecurity (03/10/2023)   Hunger Vital Sign    Worried About Running Out of Food in the Last Year: Never true    Ran Out of Food in the Last Year: Never true  Transportation Needs: No Transportation Needs (03/10/2023)   PRAPARE - Administrator, Civil Service (Medical): No    Lack of Transportation (Non-Medical): No  Physical Activity: Sufficiently Active (03/10/2023)   Exercise Vital Sign    Days of Exercise per Week: 7 days    Minutes of Exercise per Session: 30 min  Stress: No Stress Concern Present (03/10/2023)   [employer] of Occupational Health - Occupational Stress Questionnaire    Feeling of Stress : Not at all  Social Connections: Socially Integrated (03/10/2023)   Social Connection and Isolation Panel [NHANES]    Frequency of Communication with Friends and Family: More than three times a week    Frequency of Social Gatherings with Friends and Family: More than three times a week    Attends Religious Services: More than 4 times per year    Active Member of [employer] or  Organizations: Yes    Attends Engineer, structural: More than 4 times per year    Marital Status: Married    Tobacco Counseling Counseling given: Not Answered   Clinical Intake:  Pre-visit preparation completed: Yes  Pain : 0-10 Pain Score: 4  Pain Type: Chronic pain Pain Location: Back     Diabetes: Yes CBG done?: No Did pt. bring in CBG monitor from home?: No  How often do you need to have someone help you when you read instructions, pamphlets, or other written materials from your doctor or pharmacy?: 1 - Never What is the last grade level you completed in school?: 9th grade  Interpreter Needed?: No      Activities of Daily Living    03/10/2023    9:19 AM 04/03/2022   11:52 AM  In your present state of health, do you have any difficulty performing the following activities:  Hearing? 0 0  Vision? 1 0  Difficulty concentrating or making decisions? 1 1  Walking or climbing stairs? 1 0  Dressing or bathing? 0 0  Doing errands, shopping? 0 0  Preparing Food and eating ? N N  Using the Toilet? N N  In the past six months, have you accidently leaked urine? N N  Do you have problems with loss of bowel control? N N  Managing your Medications? N N  Managing your Finances? N N  Housekeeping or managing your Housekeeping? N N    Patient Care Team: Corwin Levins, MD as PCP - General Jens Som Madolyn Frieze, MD as PCP - Cardiology (Cardiology)  Indicate any recent Medical Services you may have received from other than [facility] providers in the past year (date may be approximate).     Assessment:   This is a routine wellness examination for Keith [name].  Hearing/Vision screen No results found.  Dietary issues and exercise activities discussed:     Goals Addressed             This Visit's Progress    Lifestyle Change-Hypertension  Timeframe:  Long-Range Goal Priority:  Medium Start Date:                             Expected End Date:                        Follow Up Date 03/09/2024    - learn about high blood pressure    Why is this important?   The changes that you are asked to make may be hard to do.  This is especially true when the changes are life-long.  Knowing why it is important to you is the first step.  Working on the change with your family or support person helps you not feel alone.  Reward yourself and family or support person when goals are met. This can be an activity you choose like bowling, hiking, biking, swimming or shooting hoops.     Notes:       Depression Screen    03/10/2023    9:31 AM 11/05/2022    8:42 AM 04/22/2022    9:58 AM 04/03/2022   11:38 AM 10/22/2021    9:46 AM 10/22/2021    9:03 AM 10/12/2020    9:22 AM  PHQ 2/9 Scores  PHQ - 2 Score 0 0 0 0 0 0 0  PHQ- 9 Score 0 0 5        Fall Risk    03/10/2023    9:38 AM 11/05/2022    8:43 AM 04/22/2022    9:58 AM 04/03/2022   11:36 AM 10/22/2021    9:46 AM  Fall Risk   Falls in the past year? 0 0 0 0 0  Number falls in past yr: 0 0 0 0 0  Injury with Fall? 0 0 0 0 0  Risk for fall due to : No Fall Risks;Impaired balance/gait No Fall Risks No Fall Risks No Fall Risks   Follow up Falls prevention discussed Falls evaluation completed Falls evaluation completed Falls evaluation completed     MEDICARE RISK AT HOME:   TIMED UP AND GO:  Was the test performed?  No    Cognitive Function:        03/10/2023    9:46 AM 04/03/2022   11:53 AM 05/22/2020    8:46 AM  6CIT Screen  What Year? 0 points 0 points 0 points  What month? 0 points 0 points 0 points  What time? 0 points 0 points 0 points  Count back from 20 0 points 0 points 0 points  Months in reverse 0 points 0 points 0 points  Repeat phrase 4 points 0 points 0 points  Total Score 4 points 0 points 0 points    Immunizations Immunization History  Administered Date(s) Administered   Influenza Whole 11/08/2009   Influenza,inj,Quad PF,6+ Mos 05/12/2013, 08/31/2014, 05/06/2017, 06/24/2018,  05/19/2019   Influenza-Unspecified 07/19/2015, 06/24/2018   Pneumococcal Conjugate-13 10/26/2014   Pneumococcal Polysaccharide-23 05/12/2013, 04/22/2022   Td 09/15/1997, 11/02/2008   Tdap 05/19/2019    TDAP status: Up to date  Flu Vaccine status: Declined, Education has been provided regarding the importance of this vaccine but patient still declined. Advised may receive this vaccine at local pharmacy or Health Dept. Aware to provide a copy of the vaccination record if obtained from local pharmacy or Health Dept. Verbalized acceptance and understanding.  Pneumococcal vaccine status: Up to date  Covid-19 vaccine status: Declined, Education has been provided  regarding the importance of this vaccine but patient still declined. Advised may receive this vaccine at local pharmacy or Health Dept.or vaccine clinic. Aware to provide a copy of the vaccination record if obtained from local pharmacy or Health Dept. Verbalized acceptance and understanding.  Qualifies for Shingles Vaccine? Yes   Zostavax completed No   Shingrix Completed?: No.    Education has been provided regarding the importance of this vaccine. Patient has been advised to call insurance company to determine out of pocket expense if they have not yet received this vaccine. Advised may also receive vaccine at local pharmacy or Health Dept. Verbalized acceptance and understanding.  Screening Tests Health Maintenance  Topic Date Due   Zoster Vaccines- Shingrix (1 of 2) Never done   OPHTHALMOLOGY EXAM  09/05/2022   INFLUENZA VACCINE  04/16/2023   HEMOGLOBIN A1C  05/01/2023   Diabetic kidney evaluation - eGFR measurement  11/01/2023   Diabetic kidney evaluation - Urine ACR  11/01/2023   FOOT EXAM  11/06/2023   Medicare Annual Wellness (AWV)  03/09/2024   Colonoscopy  08/01/2028   DTaP/Tdap/Td (4 - Td or Tdap) 05/18/2029   Pneumonia Vaccine 51+ Years old  Completed   Hepatitis C Screening  Completed   HIV Screening  Completed    HPV VACCINES  Aged Out   COVID-19 Vaccine  Discontinued    Health Maintenance  Health Maintenance Due  Topic Date Due   Zoster Vaccines- Shingrix (1 of 2) Never done   OPHTHALMOLOGY EXAM  09/05/2022    Colorectal cancer screening: Type of screening: Colonoscopy. Completed 08/01/2021. Repeat every 7 years  Lung Cancer Screening: (Low Dose CT Chest recommended if Age 75-80 years, 20 pack-year currently smoking OR have quit w/in 15years.) does not qualify.   Lung Cancer Screening Referral: na  Additional Screening:  Hepatitis C Screening: does qualify; Completed yes  Vision Screening: Recommended annual ophthalmology exams for early detection of glaucoma and other disorders of the eye. Is the patient up to date with their annual eye exam?  Yes  Who is the provider or what is the name of the office in which the patient attends annual eye exams? [email or web address] If pt is not established with a provider, would they like to be referred to a provider to establish care?  establish .   Dental Screening: Recommended annual dental exams for proper oral hygiene  Diabetic Foot Exam: Diabetic Foot Exam: Completed 20/21/2024  Community Resource Referral / Chronic Care Management: CRR required this visit?  No   CCM required this visit?  No     Plan:     I have personally reviewed and noted the following in the patient's chart:   Medical and social history Use of alcohol, tobacco or illicit drugs  Current medications and supplements including opioid prescriptions. Patient is not currently taking opioid prescriptions. Functional ability and status Nutritional status Physical activity Advanced directives List of other physicians Hospitalizations, surgeries, and ER visits in previous 12 months Vitals Screenings to include cognitive, depression, and falls Referrals and appointments  In addition, I have reviewed and discussed with patient certain preventive protocols, quality metrics, and  best practice recommendations. A written personalized care plan for preventive services as well as general preventive health recommendations were provided to patient.     Delana Meyer   03/12/2023   After Visit Summary: (MyChart) Due to this being a telephonic visit, the after visit summary with patients personalized plan was offered to patient via MyChart   Nurse Notes:  Keith [name] , Thank you for taking time to come for your Medicare Wellness Visit. I appreciate your ongoing commitment to your health goals. Please review the following plan we discussed and let me know if I can assist you in the future.   These are the goals we discussed:  Goals       Lifestyle Change-Hypertension      Timeframe:  Long-Range Goal Priority:  Medium Start Date:                             Expected End Date:                       Follow Up Date 03/09/2024    - learn about high blood pressure    Why is this important?   The changes that you are asked to make may be hard to do.  This is especially true when the changes are life-long.  Knowing why it is important to you is the first step.  Working on the change with your family or support person helps you not feel alone.  Reward yourself and family or support person when goals are met. This can be an activity you choose like bowling, hiking, biking, swimming or shooting hoops.     Notes:       Patient Stated (pt-stated)      Continue to decrease the amount of soda I drink then decrease the amount tea.       To maintain my health.        This is a list of the screening recommended for you and due dates:  Health Maintenance  Topic Date Due   Zoster (Shingles) Vaccine (1 of 2) Never done   Eye exam for diabetics  09/05/2022   Flu Shot  04/16/2023   Hemoglobin A1C  05/01/2023   Yearly kidney function blood test for diabetes  11/01/2023   Yearly kidney health urinalysis for diabetes  11/01/2023   Complete foot exam   11/06/2023    Medicare Annual Wellness Visit  03/09/2024   Colon Cancer Screening  08/01/2028   DTaP/Tdap/Td vaccine (4 - Td or Tdap) 05/18/2029   Pneumonia Vaccine  Completed   Hepatitis C Screening  Completed   HIV Screening  Completed   HPV Vaccine  Aged Out   COVID-19 Vaccine  Discontinued

## 2023-04-15 ENCOUNTER — Encounter (INDEPENDENT_AMBULATORY_CARE_PROVIDER_SITE_OTHER): Payer: Self-pay

## 2023-04-20 ENCOUNTER — Encounter: Payer: Self-pay | Admitting: Pharmacist

## 2023-04-20 DIAGNOSIS — G720 Drug-induced myopathy: Secondary | ICD-10-CM

## 2023-04-20 NOTE — Progress Notes (Signed)
[facility]) [facility] Quality Pharmacy Team Statin Quality Measure Assessment  04/20/2023  Keith [name] [birth year] [id number]  Per review of chart and payor information, [name] has a diagnosis of diabetes but is not currently filling a statin prescription.  This places patient into the Statin Use In Patients with Diabetes (SUPD) measure for CMS.    Patient has documented trials of multiple statins with reported muscle aches, but no corresponding CPT codes that would exclude patient from SUPD measure.  Please consider evaluating his past statin intolerance at tomorrow's office visit and code accordingly.  Code for past statin intolerance or  other exclusions (required annually)  Provider Requirements: Associate code during an office visit or telehealth encounter  Drug Induced Myopathy G72.0   Myopathy, unspecified G72.9   Myositis, unspecified M60.9   Rhabdomyolysis M62.82   Cirrhosis of liver K74.69   Prediabetes R73.03   PCOS E28.2   Thank you for allowing [facility] pharmacy to be a part of this patient's care.  Dellie Burns, PharmD Clinical Pharmacist [place]  Direct Dial: [phone]

## 2023-04-21 ENCOUNTER — Ambulatory Visit (HOSPITAL_BASED_OUTPATIENT_CLINIC_OR_DEPARTMENT_OTHER): Payer: Medicare HMO | Admitting: Family

## 2023-04-21 ENCOUNTER — Encounter (HOSPITAL_BASED_OUTPATIENT_CLINIC_OR_DEPARTMENT_OTHER): Payer: Self-pay | Admitting: Family

## 2023-04-21 VITALS — BP 122/82 | HR 63 | Ht 70.5 in | Wt 252.0 lb

## 2023-04-21 DIAGNOSIS — G720 Drug-induced myopathy: Secondary | ICD-10-CM | POA: Diagnosis not present

## 2023-04-21 DIAGNOSIS — E785 Hyperlipidemia, unspecified: Secondary | ICD-10-CM | POA: Diagnosis not present

## 2023-04-21 DIAGNOSIS — I25118 Atherosclerotic heart disease of native coronary artery with other forms of angina pectoris: Secondary | ICD-10-CM

## 2023-04-21 DIAGNOSIS — T466X5D Adverse effect of antihyperlipidemic and antiarteriosclerotic drugs, subsequent encounter: Secondary | ICD-10-CM | POA: Diagnosis not present

## 2023-04-21 DIAGNOSIS — H8110 Benign paroxysmal vertigo, unspecified ear: Secondary | ICD-10-CM | POA: Diagnosis not present

## 2023-04-21 DIAGNOSIS — I10 Essential (primary) hypertension: Secondary | ICD-10-CM | POA: Diagnosis not present

## 2023-04-21 DIAGNOSIS — I491 Atrial premature depolarization: Secondary | ICD-10-CM | POA: Diagnosis not present

## 2023-04-21 MED ORDER — EZETIMIBE 10 MG PO TABS
10.0000 mg | ORAL_TABLET | Freq: Every day | ORAL | 3 refills | Status: DC
Start: 2023-04-21 — End: 2023-05-19

## 2023-04-21 NOTE — Patient Instructions (Addendum)
Medication Instructions:  Your physician has recommended you make the following change in your medication:    Recommend using as-needed Meclizine over the counter as needed for vertigo.  START Zetia 10mg  daily This is to help lower your cholesterol  *If you need a refill on your cardiac medications before your next appointment, please call your pharmacy*  Testing/Procedures: Your EKG today showed normal sinus rhythm which is a great result!  Follow-Up: At [facility], you and your health needs are our priority.  As part of our continuing mission to provide you with exceptional heart care, we have created designated Provider Care Teams.  These Care Teams include your primary Cardiologist (physician) and Advanced Practice Providers (APPs -  Physician Assistants and Nurse Practitioners) who all work together to provide you with the care you need, when you need it.  We recommend signing up for the patient portal called "MyChart".  Sign up information is provided on this After Visit Summary.  MyChart is used to connect with patients for Virtual Visits (Telemedicine).  Patients are able to view lab/test results, encounter notes, upcoming appointments, etc.  Non-urgent messages can be sent to your provider as well.   To learn more about what you can do with MyChart, go to [email or web address].    Your next appointment:   6 month(s)  Provider:   Olga Millers, MD     Other Instructions  Benign Positional Vertigo Vertigo is the feeling that you or your surroundings are moving when they are not. Benign positional vertigo is the most common form of vertigo. This is usually a harmless condition (benign). This condition is positional. This means that symptoms are triggered by certain movements and positions. This condition can be dangerous if it occurs while you are doing something that could cause harm to yourself or others. This includes activities such as driving or operating  machinery. What are the causes? The inner ear has fluid-filled canals that help your brain sense movement and balance. When the fluid moves, the brain receives messages about your body's position. With benign positional vertigo, calcium crystals in the inner ear break free and disturb the inner ear area. This causes your brain to receive confusing messages about your body's position. What increases the risk? You are more likely to develop this condition if: You are a woman. You are 20 years of age or older. You have recently had a head injury. You have an inner ear disease. What are the signs or symptoms? Symptoms of this condition usually happen when you move your head or your eyes in different directions. Symptoms may start suddenly and usually last for less than a minute. They include: Loss of balance and falling. Feeling like you are spinning or moving. Feeling like your surroundings are spinning or moving. Nausea and vomiting. Blurred vision. Dizziness. Involuntary eye movement (nystagmus). Symptoms can be mild and cause only minor problems, or they can be severe and interfere with daily life. Episodes of benign positional vertigo may return (recur) over time. Symptoms may also improve over time. How is this diagnosed? This condition may be diagnosed based on: Your medical history. A physical exam of the head, neck, and ears. Positional tests to check for or stimulate vertigo. You may be asked to turn your head and change positions, such as going from sitting to lying down. A health care provider will watch for symptoms of vertigo. You may be referred to a health care provider who specializes in ear, nose, and throat problems (  ENT or otolaryngologist) or a provider who specializes in disorders of the nervous system (neurologist). How is this treated?  This condition may be treated in a session in which your health care provider moves your head in specific positions to help the  displaced crystals in your inner ear move. Treatment for this condition may take several sessions. Surgery may be needed in severe cases, but this is rare. In some cases, benign positional vertigo may resolve on its own in 2-4 weeks. Follow these instructions at home: Safety Move slowly. Avoid sudden body or head movements or certain positions, as told by your health care provider. Avoid driving or operating machinery until your health care provider says it is safe. Avoid doing any tasks that would be dangerous to you or others if vertigo occurs. If you have trouble walking or keeping your balance, try using a cane for stability. If you feel dizzy or unstable, sit down right away. Return to your normal activities as told by your health care provider. Ask your health care provider what activities are safe for you. General instructions Take over-the-counter and prescription medicines only as told by your health care provider. Drink enough fluid to keep your urine pale yellow. Keep all follow-up visits. This is important. Contact a health care provider if: You have a fever. Your condition gets worse or you develop new symptoms. Your family or friends notice any behavioral changes. You have nausea or vomiting that gets worse. You have numbness or a prickling and tingling sensation. Get help right away if you: Have difficulty speaking or moving. Are always dizzy or faint. Develop severe headaches. Have weakness in your legs or arms. Have changes in your hearing or vision. Develop a stiff neck. Develop sensitivity to light. These symptoms may represent a serious problem that is an emergency. Do not wait to see if the symptoms will go away. Get medical help right away. Call your local emergency services (911 in the [place] Do not drive yourself to the hospital. Summary Vertigo is the feeling that you or your surroundings are moving when they are not. Benign positional vertigo is the most common  form of vertigo. This condition is caused by calcium crystals in the inner ear that become displaced. This causes a disturbance in an area of the inner ear that helps your brain sense movement and balance. Symptoms include loss of balance and falling, feeling that you or your surroundings are moving, nausea and vomiting, and blurred vision. This condition can be diagnosed based on symptoms, a physical exam, and positional tests. Follow safety instructions as told by your health care provider and keep all follow-up visits. This is important. This information is not intended to replace advice given to you by your health care provider. Make sure you discuss any questions you have with your health care provider. Document Revised: 08/01/2020 Document Reviewed: 08/01/2020 Elsevier Patient Education  2024 [employer].
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
## 2023-04-28 ENCOUNTER — Other Ambulatory Visit (HOSPITAL_BASED_OUTPATIENT_CLINIC_OR_DEPARTMENT_OTHER): Payer: Self-pay

## 2023-04-28 ENCOUNTER — Telehealth: Payer: Self-pay

## 2023-04-28 ENCOUNTER — Other Ambulatory Visit (HOSPITAL_COMMUNITY): Payer: Self-pay

## 2023-04-28 NOTE — Telephone Encounter (Signed)
Called patient to update on PA status, no answer, unable to leave VM due to box not being set up!

## 2023-04-28 NOTE — Telephone Encounter (Signed)
-----   Message from Nurse Lendon Collar sent at 04/21/2023  9:36 AM EDT ----- [facility] ladies, this someone how got lost in the patient chart a long time ago. Can you please submit prior auth for patient repatha so it can be submitted for patient assistance?

## 2023-04-28 NOTE — Telephone Encounter (Signed)
Received notification from Physician's Office that prior authorization for REPATHA is required/requested.   Insurance verification completed.   The patient is insured through [employer] .   Per test claim: The current 84 day co-pay is, $135.  No PA needed at this time. This test claim was processed through [facility]- copay amounts may vary at other pharmacies due to pharmacy/plan contracts, or as the patient moves through the different stages of their insurance plan.

## 2023-04-30 ENCOUNTER — Ambulatory Visit: Payer: Medicare HMO | Attending: Family | Admitting: Physical Therapy

## 2023-04-30 ENCOUNTER — Encounter: Payer: Self-pay | Admitting: Physical Therapy

## 2023-04-30 DIAGNOSIS — H8110 Benign paroxysmal vertigo, unspecified ear: Secondary | ICD-10-CM | POA: Insufficient documentation

## 2023-04-30 DIAGNOSIS — R42 Dizziness and giddiness: Secondary | ICD-10-CM | POA: Diagnosis not present

## 2023-04-30 DIAGNOSIS — H8111 Benign paroxysmal vertigo, right ear: Secondary | ICD-10-CM | POA: Insufficient documentation

## 2023-04-30 NOTE — Telephone Encounter (Signed)
2nd call attempt, no answer, unable to leave VM due to box not being set up.

## 2023-04-30 NOTE — Therapy (Signed)
OUTPATIENT PHYSICAL THERAPY VESTIBULAR EVALUATION     Patient Name: Keith [name] MRN: [id number] DOB:1956/05/27, 67 y.o., male Today's Date: 05/01/2023  END OF SESSION:  PT End of Session - 05/01/23 1916     Visit Number 1    Number of Visits 1   Eval only   Authorization Type [employer]    Authorization Time Period 04-30-23 only    PT Start Time 0840    PT Stop Time 0925    PT Time Calculation (min) 45 min    Activity Tolerance Patient tolerated treatment well    Behavior During Therapy [facility] for tasks assessed/performed             Past Medical History:  Diagnosis Date   Anxiety    Arthritis    Cervical spine pain    Chronic   Chronic HFimpEF (heart failure with improved ejection fraction) (HCC)    a. 01/2010 Echo: EF 30%, diff HK; b. 08/2014 Echo: EF 50%, no rwma, GrI DD.   Diabetes mellitus    Diverticulosis    Fatty liver    GERD (gastroesophageal reflux disease)    Hereditary hemochromatosis    Compound heterozygote. Pt gets periodic phlebotomies   History of colonic polyps    History of NICM (nonischemic cardiomyopathy) w/ subsequent improvement in LV function(HCC)    a. Echo (5/11) showed EF 30% with diffuse hypokinesis, normal wall thickness, mildly decreased RV systolic function. This may be a cardiomyopathy due to hemochromatosis versus tachy-mediated; b. 08/2014 Echo: EF 50%, no rwma, GrI DD.   Hyperlipidemia    Hypertension    Hypothyroidism    Low back pain    The pt is S/P spinal fusion surgeries   Multifocal atrial tachycardia    a. Holter monior in 5/11 showed frequent runs of symtomatic MAT with heart rate around 100-->prev on amio-->now on bb only.   Nonobstructive CAD (coronary artery disease)    a. 11/2008 MV: EF 50%, Inf HK, inferior scar and ischemia in the apical anterior septum and in the inferior wall; b. 11/2008 Cath: LM nl, LAD min irregs, LCX nl, OM1/2 nl, RCA nl.   Peptic ulcer disease    Thyroid nodule    Wandering atrial  pacemaker    Past Surgical History:  Procedure Laterality Date   CORONARY STENT INTERVENTION [name] 09/06/2021   Procedure: CORONARY STENT INTERVENTION;  Surgeon: Lyn Records, MD;  Location: [facility] INVASIVE CV LAB;  Service: Cardiovascular;  Laterality: [name];   INGUINAL HERNIA REPAIR Right    LEFT HEART CATH AND CORONARY ANGIOGRAPHY [name] 09/06/2021   Procedure: LEFT HEART CATH AND CORONARY ANGIOGRAPHY;  Surgeon: Lyn Records, MD;  Location: [facility] INVASIVE CV LAB;  Service: Cardiovascular;  Laterality: [name];   LUMBAR DISC SURGERY  09/15/1989   TONSILLECTOMY     Patient Active Problem List   Diagnosis Date Noted   CKD (chronic kidney disease) stage 3, GFR 30-59 ml/min (HCC) 11/11/2022   Dizziness 11/11/2022   Encounter for well adult exam with abnormal findings 10/22/2021   B12 deficiency 10/22/2021   Exertional chest pain 09/06/2021   Unstable angina (HCC) 09/06/2021   LLQ abdominal pain 06/04/2021   Statin myopathy 10/12/2020   Epistaxis 09/13/2020   Erectile dysfunction 05/20/2019   Increased prostate specific antigen (PSA) velocity 05/19/2019   Asbestos exposure 11/14/2017   Fatigue 11/14/2017   Vision loss, bilateral 05/06/2017   Special screening for malignant neoplasms, colon 04/29/2016   Degenerative disc disease, lumbar 03/14/2016  Skin lesion 10/30/2015   Atrial tachycardia 09/05/2015   Arthralgia 11/01/2014   Subacromial bursitis 11/01/2014   Bilateral knee pain 10/26/2014   Left shoulder pain 10/26/2014   Bilateral hand pain 10/26/2013   Right lateral epicondylitis 10/26/2013   NICM (nonischemic cardiomyopathy) (HCC) 08/18/2013   Right thyroid nodule 05/12/2013   Hypothyroidism due to amiodarone 05/12/2012   Abnormal TSH 11/13/2011   Fatty infiltration of liver 08/01/2011   Iron storage disease 08/01/2011   Diverticulosis of colon (without mention of hemorrhage) 08/01/2011   Palpitations 07/02/2011   TRIGGER FINGER, RIGHT MIDDLE 11/12/2010   FLATULENCE ERUCTATION AND  GAS PAIN 10/11/2010   Chronic systolic heart failure (HCC) 01/22/2010   TACHYCARDIA 01/22/2010   Cervicalgia 05/03/2009   Hemochromatosis 01/23/2009   OTHER SPECIFIED ARTHROPATHY MULTIPLE SITES 01/23/2009   HEMOCHROMATOSIS 01/23/2009   ABNORMAL STRESS ELECTROCARDIOGRAM 11/13/2008   CHEST PAIN 11/02/2008   TRANSAMINASES, SERUM, ELEVATED 11/02/2008   Dysfunction of eustachian tube 05/02/2008   OTITIS MEDIA, SEROUS 01/12/2008   Insulin dependent type 2 diabetes mellitus, controlled (HCC) 11/02/2007   Anxiety state 11/02/2007   PEPTIC ULCER DISEASE 11/02/2007   Chronic low back pain 11/02/2007   Hyperlipidemia 06/07/2007   Essential hypertension 06/07/2007   GERD 06/07/2007   POLYP, GALLBLADDER 06/07/2007   COLONIC POLYPS, HX OF 06/07/2007   FATTY LIVER DISEASE, HX OF 06/07/2007    PCP: Corwin Levins., MD REFERRING PROVIDER: Alver Sorrow, NP  REFERRING DIAG:  Diagnosis  H81.10 (ICD-10-CM) - BPPV (benign paroxysmal positional vertigo), unspecified laterality    THERAPY DIAG:  BPPV (benign paroxysmal positional vertigo), right  Dizziness and giddiness  ONSET DATE: 04-21-23 Referral date; pt reports initial onset Feb. 2024  Rationale for Evaluation and Treatment: Rehabilitation  SUBJECTIVE:   SUBJECTIVE STATEMENT: Pt reports dizziness started about 6-8 months ago; says the last time he felt it was a week and a half ago.  Reports dizziness with rolling - either side will provoke it. Worse in the mornings. Denies nausea/vomiting - says he got a little "wheezy" a few times with it.  Says he fell off 2nd rung of ladder 2 weeks ago due to getting dizzy when he looked up Pt accompanied by: self  PERTINENT HISTORY: h/o NICM, chronic cervical spine pain, nonobstructive CAD, DM, HTN  PAIN:  Are you having pain? No  PRECAUTIONS: None  RED FLAGS: None   WEIGHT BEARING RESTRICTIONS: No  FALLS: Has patient fallen in last 6 months? Yes. Number of falls 1 - fell off 2nd rung  of ladder due to getting dizzy when he looked up - caught himself on rail of porch  LIVING ENVIRONMENT: Lives with: lives alone Lives in: House/apartment  PLOF: Independent  PATIENT GOALS: resolve the vertigo if it is still there  OBJECTIVE:   DIAGNOSTIC FINDINGS: [name]   Cervical ROM:  WFL's  GAIT: Gait pattern: WFL Distance walked: 20' Assistive device utilized: None Level of assistance: Complete Independence   VESTIBULAR ASSESSMENT:  GENERAL OBSERVATION: pt reports he has not had any dizziness in past 7-10 days   SYMPTOM BEHAVIOR:  Subjective history: pt reports dizziness started approx. 6 months ago but is intermittent in occurrence - says "it comes and goes"  Non-Vestibular symptoms:  None  Type of dizziness: Spinning/Vertigo  Frequency: has not had any dizziness in past 7-10 days  Duration: secs to minutes  Aggravating factors: Induced by position change: lying supine, rolling to the right, and rolling to the left  Relieving factors: head stationary  Progression  of symptoms: better    POSITIONAL TESTING: Right Dix-Hallpike: no nystagmus Left Dix-Hallpike: no nystagmus  MOTION SENSITIVITY:  Motion Sensitivity Quotient Intensity: 0 = none, 1 = Lightheaded, 2 = Mild, 3 = Moderate, 4 = Severe, 5 = Vomiting  Intensity  1. Sitting to supine 0  2. Supine to L side 0  3. Supine to R side 0  4. Supine to sitting 0  5. L Hallpike-Dix 0  6. Up from L  0  7. R Hallpike-Dix 0  8. Up from R  0  9. Sitting, head tipped to L knee   10. Head up from L knee   11. Sitting, head tipped to R knee   12. Head up from R knee   13. Sitting head turns x5   14.Sitting head nods x5   15. In stance, 180 turn to L    16. In stance, 180 turn to R    VESTIBULAR TREATMENT:                                                                                                   DATE: 04-30-23   PATIENT EDUCATION: Education details: instructed in Epley maneuver for self treatment prn   Person educated: Patient Education method: Programmer, multimedia, Facilities manager, and Handouts Education comprehension: verbalized understanding  HOME EXERCISE PROGRAM:  [name] - no c/o vertigo at this time  GOALS:  [name] due to eval only due to no c/o vertigo at this time  ASSESSMENT:  CLINICAL IMPRESSION: Patient is a 67 y.o. gentleman who was seen today for physical therapy evaluation and treatment for BPPV.  All positional testing was negative with no nystagmus and no c/o vertigo in any position.  Pt's symptoms are consistent with BPPV which has resolved as of this time.  Pt was instructed in Epley maneuver for self treatment prn should he have a re-occurrence of BPPV in the future.  Pt was also informed that he could return to PT for evaluation and treatment in the future if needed, pending re-occurrence of vertiginous episode.  Pt was informed that a new referral from MD would be needed - he verbalized understanding.     OBJECTIVE IMPAIRMENTS:  None at this time due to resolution of BPPV .   ACTIVITY LIMITATIONS:  [name]  PARTICIPATION LIMITATIONS:  [name]  PERSONAL FACTORS:  [name]  are also affecting patient's functional outcome. (NO C/O's vertigo at this time)  REHAB POTENTIAL: Good  CLINICAL DECISION MAKING: Stable/uncomplicated  EVALUATION COMPLEXITY: Low   PLAN:  PT FREQUENCY: one time visit  PT DURATION: 1 week  PLANNED INTERVENTIONS: Therapeutic exercises, Therapeutic activity, Neuromuscular re-education, Balance training, Gait training, Patient/Family education, Self Care, Vestibular training, and Canalith repositioning  PLAN FOR NEXT SESSION: [name] - EVAL ONLY DUE TO RESOLUTION OF VERTIGO AT THIS TIME   Kary Kos, PT 05/01/2023, 7:18 PM

## 2023-04-30 NOTE — Patient Instructions (Signed)
Benign Positional Vertigo Vertigo is the feeling that you or your surroundings are moving when they are not. Benign positional vertigo is the most common form of vertigo. This is usually a harmless condition (benign). This condition is positional. This means that symptoms are triggered by certain movements and positions. This condition can be dangerous if it occurs while you are doing something that could cause harm to yourself or others. This includes activities such as driving or operating machinery. What are the causes? The inner ear has fluid-filled canals that help your brain sense movement and balance. When the fluid moves, the brain receives messages about your body's position. With benign positional vertigo, calcium crystals in the inner ear break free and disturb the inner ear area. This causes your brain to receive confusing messages about your body's position. What increases the risk? You are more likely to develop this condition if: You are a woman. You are 66 years of age or older. You have recently had a head injury. You have an inner ear disease. What are the signs or symptoms? Symptoms of this condition usually happen when you move your head or your eyes in different directions. Symptoms may start suddenly and usually last for less than a minute. They include: Loss of balance and falling. Feeling like you are spinning or moving. Feeling like your surroundings are spinning or moving. Nausea and vomiting. Blurred vision. Dizziness. Involuntary eye movement (nystagmus). Symptoms can be mild and cause only minor problems, or they can be severe and interfere with daily life. Episodes of benign positional vertigo may return (recur) over time. Symptoms may also improve over time. How is this diagnosed? This condition may be diagnosed based on: Your medical history. A physical exam of the head, neck, and ears. Positional tests to check for or stimulate vertigo. You may be asked to  turn your head and change positions, such as going from sitting to lying down. A health care provider will watch for symptoms of vertigo. You may be referred to a health care provider who specializes in ear, nose, and throat problems (ENT or otolaryngologist) or a provider who specializes in disorders of the nervous system (neurologist). How is this treated?  This condition may be treated in a session in which your health care provider moves your head in specific positions to help the displaced crystals in your inner ear move. Treatment for this condition may take several sessions. Surgery may be needed in severe cases, but this is rare. In some cases, benign positional vertigo may resolve on its own in 2-4 weeks. Follow these instructions at home: Safety Move slowly. Avoid sudden body or head movements or certain positions, as told by your health care provider. Avoid driving or operating machinery until your health care provider says it is safe. Avoid doing any tasks that would be dangerous to you or others if vertigo occurs. If you have trouble walking or keeping your balance, try using a cane for stability. If you feel dizzy or unstable, sit down right away. Return to your normal activities as told by your health care provider. Ask your health care provider what activities are safe for you. General instructions Take over-the-counter and prescription medicines only as told by your health care provider. Drink enough fluid to keep your urine pale yellow. Keep all follow-up visits. This is important. Contact a health care provider if: You have a fever. Your condition gets worse or you develop new symptoms. Your family or friends notice any behavioral changes.  You have nausea or vomiting that gets worse. You have numbness or a prickling and tingling sensation. Get help right away if you: Have difficulty speaking or moving. Are always dizzy or faint. Develop severe headaches. Have weakness in  your legs or arms. Have changes in your hearing or vision. Develop a stiff neck. Develop sensitivity to light. These symptoms may represent a serious problem that is an emergency. Do not wait to see if the symptoms will go away. Get medical help right away. Call your local emergency services (911 in the [place] Do not drive yourself to the hospital. Summary Vertigo is the feeling that you or your surroundings are moving when they are not. Benign positional vertigo is the most common form of vertigo. This condition is caused by calcium crystals in the inner ear that become displaced. This causes a disturbance in an area of the inner ear that helps your brain sense movement and balance. Symptoms include loss of balance and falling, feeling that you or your surroundings are moving, nausea and vomiting, and blurred vision. This condition can be diagnosed based on symptoms, a physical exam, and positional tests. Follow safety instructions as told by your health care provider and keep all follow-up visits. This is important. This information is not intended to replace advice given to you by your health care provider. Make sure you discuss any questions you have with your health care provider. Document Revised: 08/01/2020 Document Reviewed: 08/01/2020 Elsevier Patient Education  2024 [employer]     How to Perform the Epley Maneuver The Epley maneuver is an exercise that relieves symptoms of vertigo. Vertigo is the feeling that you or your surroundings are moving when they are not. When you feel vertigo, you may feel like the room is spinning and may have trouble walking. The Epley maneuver is used for a type of vertigo caused by a calcium deposit in a part of the inner ear. The maneuver involves changing head positions to help the deposit move out of the area. You can do this maneuver at home whenever you have symptoms of vertigo. You can repeat it in 24 hours if your vertigo has not gone away. Even  though the Epley maneuver may relieve your vertigo for a few weeks, it is possible that your symptoms will return. This maneuver relieves vertigo, but it does not relieve dizziness. What are the risks? If it is done correctly, the Epley maneuver is considered safe. Sometimes it can lead to dizziness or nausea that goes away after a short time. If you develop other symptoms--such as changes in vision, weakness, or numbness--stop doing the maneuver and call your health care provider. Supplies needed: A bed or table. A pillow. How to do the Epley maneuver     Sit on the edge of a bed or table with your back straight and your legs extended or hanging over the edge of the bed or table. Turn your head halfway toward the affected ear or side as told by your health care provider. Lie backward quickly with your head turned until you are lying flat on your back. Your head should dangle (head-hanging position). You may want to position a pillow under your shoulders. Hold this position for at least 30 seconds. If you feel dizzy or have symptoms of vertigo, continue to hold the position until the symptoms stop. Turn your head to the opposite direction until your unaffected ear is facing down. Your head should continue to dangle. Hold this position for at  least 30 seconds. If you feel dizzy or have symptoms of vertigo, continue to hold the position until the symptoms stop. Turn your whole body to the same side as your head so that you are positioned on your side. Your head will now be nearly facedown and no longer needs to dangle. Hold for at least 30 seconds. If you feel dizzy or have symptoms of vertigo, continue to hold the position until the symptoms stop. Sit back up. You can repeat the maneuver in 24 hours if your vertigo does not go away. Follow these instructions at home: For 24 hours after doing the Epley maneuver: Keep your head in an upright position. When lying down to sleep or rest, keep your  head raised (elevated) with two or more pillows. Avoid excessive neck movements. Activity Do not drive or use machinery if you feel dizzy. After doing the Epley maneuver, return to your normal activities as told by your health care provider. Ask your health care provider what activities are safe for you. General instructions Drink enough fluid to keep your urine pale yellow. Do not drink alcohol. Take over-the-counter and prescription medicines only as told by your health care provider. Keep all follow-up visits. This is important. Preventing vertigo symptoms Ask your health care provider if there is anything you should do at home to prevent vertigo. He or she may recommend that you: Keep your head elevated with two or more pillows while you sleep. Do not sleep on the side of your affected ear. Get up slowly from bed. Avoid sudden movements during the day. Avoid extreme head positions or movement, such as looking up or bending over. Contact a health care provider if: Your vertigo gets worse. You have other symptoms, including: Nausea. Vomiting. Headache. Get help right away if you: Have vision changes. Have a headache or neck pain that is severe or getting worse. Cannot stop vomiting. Have new numbness or weakness in any part of your body. These symptoms may represent a serious problem that is an emergency. Do not wait to see if the symptoms will go away. Get medical help right away. Call your local emergency services (911 in the [place] Do not drive yourself to the hospital. Summary Vertigo is the feeling that you or your surroundings are moving when they are not. The Epley maneuver is an exercise that relieves symptoms of vertigo. If the Epley maneuver is done correctly, it is considered safe. This information is not intended to replace advice given to you by your health care provider. Make sure you discuss any questions you have with your health care provider. Document Revised:  08/01/2020 Document Reviewed: 08/01/2020 Elsevier Patient Education  2024 [employer].   Self Treatment for Right Posterior / Anterior Canalithiasis    Sitting on bed: 1. Turn head 45 right. (a) Lie back slowly, shoulders on pillow, head on bed. (b) Hold __20-30__ seconds. 2. Keeping head on bed, turn head 90 left. Hold _20-30___ seconds. 3. Roll to left, head on 45 angle down toward bed. Hold _20-30___ seconds. 4. Sit up on left side of bed. Repeat __3__ times per session. Do __2__ sessions per day.  Copyright  [employer]. All rights reserved.

## 2023-05-01 ENCOUNTER — Encounter: Payer: Self-pay | Admitting: Physical Therapy

## 2023-05-01 NOTE — Telephone Encounter (Signed)
3rd call attempt, reviewed prior auth results with patient. Patient verbalizes understanding.

## 2023-05-06 ENCOUNTER — Ambulatory Visit: Payer: Medicare HMO | Admitting: Internal Medicine

## 2023-05-15 ENCOUNTER — Other Ambulatory Visit: Payer: Medicare HMO

## 2023-05-15 DIAGNOSIS — E559 Vitamin D deficiency, unspecified: Secondary | ICD-10-CM

## 2023-05-15 DIAGNOSIS — E1165 Type 2 diabetes mellitus with hyperglycemia: Secondary | ICD-10-CM | POA: Diagnosis not present

## 2023-05-15 LAB — LIPID PANEL
Cholesterol: 182 mg/dL (ref 0–200)
HDL: 29.7 mg/dL — ABNORMAL LOW (ref >39.00)
LDL Cholesterol: 123 mg/dL — ABNORMAL HIGH (ref 0–99)
NonHDL: 152.47
Total CHOL/HDL Ratio: 6
Triglycerides: 146 mg/dL (ref 0.0–149.0)
VLDL: 29.2 mg/dL (ref 0.0–40.0)

## 2023-05-15 LAB — HEMOGLOBIN A1C: Hgb A1c MFr Bld: 6.5 % (ref 4.6–6.5)

## 2023-05-15 LAB — HEPATIC FUNCTION PANEL
ALT: 12 U/L (ref 0–53)
AST: 14 U/L (ref 0–37)
Albumin: 4.2 g/dL (ref 3.5–5.2)
Alkaline Phosphatase: 41 U/L (ref 39–117)
Bilirubin, Direct: 0.1 mg/dL (ref 0.0–0.3)
Total Bilirubin: 0.5 mg/dL (ref 0.2–1.2)
Total Protein: 7.2 g/dL (ref 6.0–8.3)

## 2023-05-15 LAB — BASIC METABOLIC PANEL
BUN: 16 mg/dL (ref 6–23)
CO2: 26 mEq/L (ref 19–32)
Calcium: 9.4 mg/dL (ref 8.4–10.5)
Chloride: 103 mEq/L (ref 96–112)
Creatinine, Ser: 1.34 mg/dL (ref 0.40–1.50)
GFR: 55.35 mL/min — ABNORMAL LOW (ref >60.00)
Glucose, Bld: 96 mg/dL (ref 70–99)
Potassium: 4.1 mEq/L (ref 3.5–5.1)
Sodium: 138 mEq/L (ref 135–145)

## 2023-05-15 LAB — VITAMIN D 25 HYDROXY (VIT D DEFICIENCY, FRACTURES): VITD: 43.29 ng/mL (ref 30.00–100.00)

## 2023-05-15 NOTE — Progress Notes (Signed)
The test results show that your current treatment is OK, as the tests are stable.  Please continue the same plan.  There is no other need for change of treatment or further evaluation based on these results, at this time.  thanks 

## 2023-05-19 ENCOUNTER — Encounter: Payer: Self-pay | Admitting: Internal Medicine

## 2023-05-19 ENCOUNTER — Ambulatory Visit (INDEPENDENT_AMBULATORY_CARE_PROVIDER_SITE_OTHER): Payer: Medicare HMO | Admitting: Internal Medicine

## 2023-05-19 VITALS — BP 134/80 | HR 65 | Temp 98.2°F | Ht 70.5 in | Wt 255.0 lb

## 2023-05-19 DIAGNOSIS — E119 Type 2 diabetes mellitus without complications: Secondary | ICD-10-CM | POA: Diagnosis not present

## 2023-05-19 DIAGNOSIS — Z125 Encounter for screening for malignant neoplasm of prostate: Secondary | ICD-10-CM | POA: Diagnosis not present

## 2023-05-19 DIAGNOSIS — E559 Vitamin D deficiency, unspecified: Secondary | ICD-10-CM

## 2023-05-19 DIAGNOSIS — E7800 Pure hypercholesterolemia, unspecified: Secondary | ICD-10-CM

## 2023-05-19 DIAGNOSIS — N1831 Chronic kidney disease, stage 3a: Secondary | ICD-10-CM | POA: Diagnosis not present

## 2023-05-19 DIAGNOSIS — I10 Essential (primary) hypertension: Secondary | ICD-10-CM

## 2023-05-19 DIAGNOSIS — Z7984 Long term (current) use of oral hypoglycemic drugs: Secondary | ICD-10-CM

## 2023-05-19 DIAGNOSIS — Z794 Long term (current) use of insulin: Secondary | ICD-10-CM | POA: Diagnosis not present

## 2023-05-19 DIAGNOSIS — K219 Gastro-esophageal reflux disease without esophagitis: Secondary | ICD-10-CM

## 2023-05-19 DIAGNOSIS — E538 Deficiency of other specified B group vitamins: Secondary | ICD-10-CM | POA: Diagnosis not present

## 2023-05-19 MED ORDER — REPATHA SURECLICK 140 MG/ML ~~LOC~~ SOAJ: 140.0000 mg | SUBCUTANEOUS | 3 refills | Status: DC

## 2023-05-19 NOTE — Progress Notes (Signed)
Patient ID: Keith [name], male   DOB: Apr 10, 1956, 67 y.o.   MRN: [id number]        Chief Complaint: follow up HTN, HLD DM, ckd3a       HPI:  Keith [name] is a 67 y.o. male here overall doing ok; Pt denies chest pain, increased sob or doe, wheezing, orthopnea, PND, increased LE swelling, palpitations, dizziness or syncope.   Pt denies polydipsia, polyuria, or new focal neuro s/s.    Pt denies fever, wt loss, night sweats, loss of appetite, or other constitutional symptoms  Has  Not taking zeita as was informed his repatha was approved, but not yet started repatha as well.    Has had mild reflux symptoms in the past months. But Denies other abd pain, dysphagia, n/v, bowel change or blood. Wt Readings from Last 3 Encounters:  05/19/23 255 lb (115.7 kg)  04/21/23 252 lb (114.3 kg)  11/05/22 250 lb (113.4 kg)   BP Readings from Last 3 Encounters:  05/19/23 134/80  04/21/23 122/82  11/05/22 136/80         Past Medical History:  Diagnosis Date   Anxiety    Arthritis    Cervical spine pain    Chronic   Chronic HFimpEF (heart failure with improved ejection fraction) (HCC)    a. 01/2010 Echo: EF 30%, diff HK; b. 08/2014 Echo: EF 50%, no rwma, GrI DD.   Diabetes mellitus    Diverticulosis    Fatty liver    GERD (gastroesophageal reflux disease)    Hereditary hemochromatosis    Compound heterozygote. Pt gets periodic phlebotomies   History of colonic polyps    History of NICM (nonischemic cardiomyopathy) w/ subsequent improvement in LV function(HCC)    a. Echo (5/11) showed EF 30% with diffuse hypokinesis, normal wall thickness, mildly decreased RV systolic function. This may be a cardiomyopathy due to hemochromatosis versus tachy-mediated; b. 08/2014 Echo: EF 50%, no rwma, GrI DD.   Hyperlipidemia    Hypertension    Hypothyroidism    Low back pain    The pt is S/P spinal fusion surgeries   Multifocal atrial tachycardia    a. Holter monior in 5/11 showed frequent runs of  symtomatic MAT with heart rate around 100-->prev on amio-->now on bb only.   Nonobstructive CAD (coronary artery disease)    a. 11/2008 MV: EF 50%, Inf HK, inferior scar and ischemia in the apical anterior septum and in the inferior wall; b. 11/2008 Cath: LM nl, LAD min irregs, LCX nl, OM1/2 nl, RCA nl.   Peptic ulcer disease    Thyroid nodule    Wandering atrial pacemaker    Past Surgical History:  Procedure Laterality Date   CORONARY STENT INTERVENTION [name] 09/06/2021   Procedure: CORONARY STENT INTERVENTION;  Surgeon: Lyn Records, MD;  [facility];  Service: Cardiovascular;  Laterality: [name];   INGUINAL HERNIA REPAIR Right    LEFT HEART CATH AND CORONARY ANGIOGRAPHY [name] 09/06/2021   Procedure: LEFT HEART CATH AND CORONARY ANGIOGRAPHY;  Surgeon: Lyn Records, MD;  Location: [facility];  Service: Cardiovascular;  Laterality: [name];   LUMBAR DISC SURGERY  09/15/1989   TONSILLECTOMY      reports that he quit smoking about 19 years ago. His smoking use included cigarettes. He has never used smokeless tobacco. He reports that he does not currently use alcohol. He reports that he does not currently use drugs. family history includes Alcohol abuse in his father; Colitis  in his mother; Colon polyps in his mother; Diabetes in his mother, paternal grandfather, and paternal grandmother; Hypertension in his father; Multiple sclerosis in his mother and sister. Allergies  Allergen Reactions   Dicyclomine Nausea And Vomiting and Other (See Comments)    Lightheaded, dizziness, SOB   Doxycycline Nausea Only    Weak, fatigue, "sick on stomach"   Lipitor [Atorvastatin]     myalgias   Penicillins Other (See Comments)    Childhood allergy - unknown reaction   Reglan [Metoclopramide] Other (See Comments)    Unknown reaction   Zocor [Simvastatin] Other (See Comments)    Myalgia    Zoloft [Sertraline Hcl] Other (See Comments)    Unknown reaction    Current Outpatient  Medications on File Prior to Visit  Medication Sig Dispense Refill   Alcohol Swabs (B-D SINGLE USE SWABS REGULAR) PADS Use as directed once daily E11.9 100 each 3   amLODipine (NORVASC) 5 MG tablet TAKE 1 TABLET EVERY DAY 90 tablet 1   aspirin 81 MG EC tablet Take 81 mg by mouth daily.     Blood Glucose Calibration (TRUE METRIX LEVEL 1) Low SOLN Use as directed once daily E11.9 1 each 3   Blood Glucose Monitoring Suppl (TRUE METRIX AIR GLUCOSE METER) w/Device KIT USE AS DIRECTED 1 kit 0   Cholecalciferol (VITAMIN D-3) 25 MCG (1000 UT) CAPS Take 2,000 Units by mouth daily.     clopidogrel (PLAVIX) 75 MG tablet TAKE 1 TABLET EVERY DAY 90 tablet 10   DROPLET PEN NEEDLES 32G X 4 MM MISC      fenofibrate (TRICOR) 145 MG tablet TAKE 1 TABLET EVERY DAY 90 tablet 10   glipiZIDE (GLUCOTROL XL) 10 MG 24 hr tablet TAKE 1 TABLET EVERY DAY WITH BREAKFAST 90 tablet 3   glucosamine-chondroitin 500-400 MG tablet Take 1 tablet by mouth daily.     glucose blood (RELION TRUE METRIX TEST STRIPS) test strip Use as instructed once daily E11.9 100 each 12   levothyroxine (SYNTHROID) 25 MCG tablet TAKE 1 TABLET EVERY DAY (NEED MD APPOINTMENT) 90 tablet 10   metFORMIN (GLUCOPHAGE-XR) 500 MG 24 hr tablet TAKE 4 TABLETS EVERY DAY WITH BREAKFAST 360 tablet 10   metoprolol succinate (TOPROL-XL) 100 MG 24 hr tablet TAKE 1 TABLET TWICE DAILY. TAKE WITH OR IMMEDIATELY FOLLOWING A MEAL. 180 tablet 10   olmesartan (BENICAR) 40 MG tablet TAKE 1 TABLET EVERY DAY 90 tablet 10   Omega-3 Fatty Acids (FISH OIL PO) Take 1 tablet by mouth daily.      pantoprazole (PROTONIX) 40 MG tablet TAKE 1 TABLET EVERY DAY 90 tablet 10   pioglitazone (ACTOS) 30 MG tablet Take 1 tablet (30 mg total) by mouth daily. 90 tablet 3   TRUEplus Lancets 33G MISC Use as directed once daily E11.9 100 each 12   tadalafil (CIALIS) 20 MG tablet Take 1 tablet (20 mg total) by mouth daily as needed for erectile dysfunction. 30 tablet 11   No current  facility-administered medications on file prior to visit.        ROS:  All others reviewed and negative.  Objective        PE:  BP 134/80 (BP Location: Left Arm, Patient Position: Sitting, Cuff Size: Normal)   Pulse 65   Temp 98.2 F (36.8 C) (Oral)   Ht 5' 10.5" (1.791 m)   Wt 255 lb (115.7 kg)   SpO2 99%   BMI 36.07 kg/m  Constitutional: Pt appears in NAD               HENT: Head: NCAT.                Right Ear: External ear normal.                 Left Ear: External ear normal.                Eyes: . Pupils are equal, round, and reactive to light. Conjunctivae and EOM are normal               Nose: without d/c or deformity               Neck: Neck supple. Gross normal ROM               Cardiovascular: Normal rate and regular rhythm.                 Pulmonary/Chest: Effort normal and breath sounds without rales or wheezing.                Abd:  Soft, NT, ND, + BS, no organomegaly               Neurological: Pt is alert. At baseline orientation, motor grossly intact               Skin: Skin is warm. No rashes, no other new lesions, LE edema - none               Psychiatric: Pt behavior is normal without agitation   Micro: none  Cardiac tracings I have personally interpreted today:  none  Pertinent Radiological findings (summarize): none   Lab Results  Component Value Date   WBC 7.4 10/31/2022   HGB 14.7 10/31/2022   HCT 43.1 10/31/2022   PLT 228.0 10/31/2022   GLUCOSE 96 05/15/2023   CHOL 182 05/15/2023   TRIG 146.0 05/15/2023   HDL 29.70 (L) 05/15/2023   LDLDIRECT 127.0 11/15/2019   LDLCALC 123 (H) 05/15/2023   ALT 12 05/15/2023   AST 14 05/15/2023   NA 138 05/15/2023   K 4.1 05/15/2023   CL 103 05/15/2023   CREATININE 1.34 05/15/2023   BUN 16 05/15/2023   CO2 26 05/15/2023   TSH 5.33 10/31/2022   PSA 1.08 10/31/2022   INR 1.1 (H) 07/09/2016   HGBA1C 6.5 05/15/2023   MICROALBUR <0.7 10/31/2022   Assessment/Plan:  RAMIR KALLAY is a  66 y.o. White or Caucasian [1] male with  has a past medical history of Anxiety, Arthritis, Cervical spine pain, Chronic HFimpEF (heart failure with improved ejection fraction) (HCC), Diabetes mellitus, Diverticulosis, Fatty liver, GERD (gastroesophageal reflux disease), Hereditary hemochromatosis, History of colonic polyps, History of NICM (nonischemic cardiomyopathy) w/ subsequent improvement in LV function(HCC), Hyperlipidemia, Hypertension, Hypothyroidism, Low back pain, Multifocal atrial tachycardia, Nonobstructive CAD (coronary artery disease), Peptic ulcer disease, Thyroid nodule, and Wandering atrial pacemaker.  CKD (chronic kidney disease) stage 3, GFR 30-59 ml/min (HCC) Lab Results  Component Value Date   CREATININE 1.34 05/15/2023   Stable overall, cont to avoid nephrotoxins   Essential hypertension BP Readings from Last 3 Encounters:  05/19/23 134/80  04/21/23 122/82  11/05/22 136/80   Stable, pt to continue medical treatment norvasc 5 every day, benicar 40 every day, toprol xl 100 bid   Hyperlipidemia Lab Results  Component Value Date   LDLCALC 123 (H) 05/15/2023   Uncontrolled, to start repatha and  hold on start zetia for now,  pt is statin intolerant   Insulin dependent type 2 diabetes mellitus, controlled (HCC) Lab Results  Component Value Date   HGBA1C 6.5 05/15/2023   Stable, pt to continue current medical treatment glucotrol xl 10 every day, metformin ER 500 mg - 4 every day, actos 30 qd   GERD Mild to mod, for otc pepcid bid prn, to f/u any worsening symptoms or concerns Followup: Return in about 6 months (around 11/16/2023).  Oliver Barre, MD 05/22/2023 7:22 AM [facility] - [facility]

## 2023-05-19 NOTE — Patient Instructions (Addendum)
Please remember to have your yearly eye exam  MyChart Username [email or web address]   Please take all new medication as prescribed - the repatha sent to [facility]   Please consider adding Mounjaro as well maybe next year when the donut hold goes away (and then you can cut back on other DM meds like glipizide)  Please continue all other medications as before, and refills have been done if requested.  Please have the pharmacy call with any other refills you may need.  Please continue your efforts at being more active, low cholesterol diet, and weight control.  Please keep your appointments with your specialists as you may have planned  Please make an Appointment to return in 6 months, or sooner if needed

## 2023-05-22 ENCOUNTER — Encounter: Payer: Self-pay | Admitting: Internal Medicine

## 2023-05-22 NOTE — Assessment & Plan Note (Signed)
Lab Results  Component Value Date   CREATININE 1.34 05/15/2023   Stable overall, cont to avoid nephrotoxins

## 2023-05-22 NOTE — Assessment & Plan Note (Signed)
BP Readings from Last 3 Encounters:  05/19/23 134/80  04/21/23 122/82  11/05/22 136/80   Stable, pt to continue medical treatment norvasc 5 every day, benicar 40 every day, toprol xl 100 bid

## 2023-05-22 NOTE — Assessment & Plan Note (Signed)
Mild to mod, for otc pepcid bid prn, to f/u any worsening symptoms or concerns

## 2023-05-22 NOTE — Assessment & Plan Note (Signed)
Lab Results  Component Value Date   LDLCALC 123 (H) 05/15/2023   Uncontrolled, to start repatha and hold on start zetia for now,  pt is statin intolerant

## 2023-05-22 NOTE — Assessment & Plan Note (Signed)
Lab Results  Component Value Date   HGBA1C 6.5 05/15/2023   Stable, pt to continue current medical treatment glucotrol xl 10 every day, metformin ER 500 mg - 4 every day, actos 30 qd

## 2023-05-29 DIAGNOSIS — R0981 Nasal congestion: Secondary | ICD-10-CM | POA: Diagnosis not present

## 2023-05-29 DIAGNOSIS — R059 Cough, unspecified: Secondary | ICD-10-CM | POA: Diagnosis not present

## 2023-06-01 ENCOUNTER — Telehealth: Payer: Self-pay

## 2023-06-01 ENCOUNTER — Other Ambulatory Visit (HOSPITAL_COMMUNITY): Payer: Self-pay

## 2023-06-01 NOTE — Telephone Encounter (Signed)
Pharmacy Patient Advocate Encounter   Received notification from CoverMyMeds that prior authorization for Repatha SureClick 140MG /ML auto-injectors is required/requested.   Insurance verification completed.   The patient is insured through [employer] .   Per test claim: PA required; PA submitted to [facility] via CoverMyMeds Key/confirmation #/EOC [id number] Status is pending

## 2023-06-02 ENCOUNTER — Other Ambulatory Visit (HOSPITAL_COMMUNITY): Payer: Self-pay

## 2023-06-02 NOTE — Telephone Encounter (Signed)
Pharmacy Patient Advocate Encounter  Received notification from [facility] that Prior Authorization for Repatha SureClick 140MG /ML auto-injectors has been APPROVED from 09/15/22 to 09/15/23. Ran test claim, Copay is $45. This test claim was processed through [facility]- copay amounts may vary at other pharmacies due to pharmacy/plan contracts, or as the patient moves through the different stages of their insurance plan.   PA #/Case ID/Reference #: [id number]

## 2023-06-03 ENCOUNTER — Telehealth: Payer: Self-pay | Admitting: Internal Medicine

## 2023-06-03 ENCOUNTER — Other Ambulatory Visit: Payer: Self-pay

## 2023-06-03 MED ORDER — REPATHA SURECLICK 140 MG/ML ~~LOC~~ SOAJ: 140.0000 mg | SUBCUTANEOUS | 3 refills | Status: DC

## 2023-06-03 NOTE — Telephone Encounter (Signed)
Prescription Request  06/03/2023  LOV: 05/19/2023  What is the name of the medication or equipment? Evolocumab (REPATHA SURECLICK) 140 MG/ML SOAJ   Have you contacted your pharmacy to request a refill? No   Which pharmacy would you like this sent to?    Patient notified that their request is being sent to the clinical staff for review and that they should receive a response within 2 business days.   Please advise at Mobile [phone] (mobile)

## 2023-06-03 NOTE — Telephone Encounter (Signed)
Refill sent.

## 2023-06-17 ENCOUNTER — Other Ambulatory Visit (HOSPITAL_COMMUNITY): Payer: Self-pay

## 2023-06-17 ENCOUNTER — Other Ambulatory Visit (HOSPITAL_BASED_OUTPATIENT_CLINIC_OR_DEPARTMENT_OTHER): Payer: Self-pay | Admitting: Family

## 2023-06-17 DIAGNOSIS — I10 Essential (primary) hypertension: Secondary | ICD-10-CM

## 2023-07-21 ENCOUNTER — Ambulatory Visit: Payer: Medicare HMO | Admitting: Internal Medicine

## 2023-07-21 ENCOUNTER — Encounter: Payer: Self-pay | Admitting: Internal Medicine

## 2023-07-21 VITALS — BP 122/74 | HR 70 | Temp 98.5°F | Ht 70.5 in | Wt 255.0 lb

## 2023-07-21 DIAGNOSIS — N529 Male erectile dysfunction, unspecified: Secondary | ICD-10-CM | POA: Diagnosis not present

## 2023-07-21 DIAGNOSIS — Z794 Long term (current) use of insulin: Secondary | ICD-10-CM

## 2023-07-21 DIAGNOSIS — M7022 Olecranon bursitis, left elbow: Secondary | ICD-10-CM

## 2023-07-21 DIAGNOSIS — E7800 Pure hypercholesterolemia, unspecified: Secondary | ICD-10-CM

## 2023-07-21 DIAGNOSIS — E119 Type 2 diabetes mellitus without complications: Secondary | ICD-10-CM | POA: Diagnosis not present

## 2023-07-21 MED ORDER — SILDENAFIL CITRATE 100 MG PO TABS
50.0000 mg | ORAL_TABLET | Freq: Every day | ORAL | 5 refills | Status: AC | PRN
Start: 2023-07-21 — End: ?

## 2023-07-21 NOTE — Assessment & Plan Note (Signed)
Lab Results  Component Value Date   LDLCALC 123 (H) 05/15/2023   Stable, pt to start repatha after jan 1 when donut hole is not in effect

## 2023-07-21 NOTE — Assessment & Plan Note (Signed)
Mild, benign appearing inflammation due to irritation from pressure injury - for otc volt gel prn, no more pressure on the elbow,  to f/u any worsening symptoms or concerns

## 2023-07-21 NOTE — Progress Notes (Signed)
Patient ID: Keith [name], male   DOB: [birth year], 67 y.o.   MRN: [id number]        Chief Complaint: follow up left elbow swelling, ED, dm, hld       HPI:  Keith [name] is a 67 y.o. male here with c/o 2 mo leaning on the left elbow quite a bit on the soft couch but doing this for many hours per day, now with swelling and mild discomfort worse to touch, but no other trauma, fever.  Pt denies chest pain, increased sob or doe, wheezing, orthopnea, PND, increased LE swelling, palpitations, dizziness or syncope.   Pt denies polydipsia, polyuria, or new focal neuro s/s.    Pt denies fever, wt loss, night sweats, loss of appetite, or other constitutional symptoms  Also with worsening ED in the past 6 mo, asking to restart ED med.         Wt Readings from Last 3 Encounters:  07/21/23 255 lb (115.7 kg)  05/19/23 255 lb (115.7 kg)  04/21/23 252 lb (114.3 kg)   BP Readings from Last 3 Encounters:  07/21/23 122/74  05/19/23 134/80  04/21/23 122/82         Past Medical History:  Diagnosis Date   Anxiety    Arthritis    Cervical spine pain    Chronic   Chronic HFimpEF (heart failure with improved ejection fraction) (HCC)    a. 01/2010 Echo: EF 30%, diff HK; b. 08/2014 Echo: EF 50%, no rwma, GrI DD.   Diabetes mellitus    Diverticulosis    Fatty liver    GERD (gastroesophageal reflux disease)    Hereditary hemochromatosis    Compound heterozygote. Pt gets periodic phlebotomies   History of colonic polyps    History of NICM (nonischemic cardiomyopathy) w/ subsequent improvement in LV function(HCC)    a. Echo (5/11) showed EF 30% with diffuse hypokinesis, normal wall thickness, mildly decreased RV systolic function. This may be a cardiomyopathy due to hemochromatosis versus tachy-mediated; b. 08/2014 Echo: EF 50%, no rwma, GrI DD.   Hyperlipidemia    Hypertension    Hypothyroidism    Low back pain    The pt is S/P spinal fusion surgeries   Multifocal atrial tachycardia (HCC)    a.  Holter monior in 5/11 showed frequent runs of symtomatic MAT with heart rate around 100-->prev on amio-->now on bb only.   Nonobstructive CAD (coronary artery disease)    a. 11/2008 MV: EF 50%, Inf HK, inferior scar and ischemia in the apical anterior septum and in the inferior wall; b. 11/2008 Cath: LM nl, LAD min irregs, LCX nl, OM1/2 nl, RCA nl.   Peptic ulcer disease    Thyroid nodule    Wandering atrial pacemaker    Past Surgical History:  Procedure Laterality Date   CORONARY STENT INTERVENTION [name] 09/06/2021   Procedure: CORONARY STENT INTERVENTION;  Surgeon: Lyn Records, MD;  Location: [facility];  Service: Cardiovascular;  Laterality: [name];   INGUINAL HERNIA REPAIR Right    LEFT HEART CATH AND CORONARY ANGIOGRAPHY [name] 09/06/2021   Procedure: LEFT HEART CATH AND CORONARY ANGIOGRAPHY;  Surgeon: Lyn Records, MD;  Location: [facility];  Service: Cardiovascular;  Laterality: [name];   LUMBAR DISC SURGERY  09/15/1989   TONSILLECTOMY      reports that he quit smoking about 19 years ago. His smoking use included cigarettes. He has never used smokeless tobacco. He reports that he does not currently use  alcohol. He reports that he does not currently use drugs. family history includes Alcohol abuse in his father; Colitis in his mother; Colon polyps in his mother; Diabetes in his mother, paternal grandfather, and paternal grandmother; Hypertension in his father; Multiple sclerosis in his mother and sister. Allergies  Allergen Reactions   Dicyclomine Nausea And Vomiting and Other (See Comments)    Lightheaded, dizziness, SOB   Doxycycline Nausea Only    Weak, fatigue, "sick on stomach"   Lipitor [Atorvastatin]     myalgias   Penicillins Other (See Comments)    Childhood allergy - unknown reaction   Reglan [Metoclopramide] Other (See Comments)    Unknown reaction   Zocor [Simvastatin] Other (See Comments)    Myalgia    Zoloft [Sertraline Hcl] Other (See Comments)     Unknown reaction    Current Outpatient Medications on File Prior to Visit  Medication Sig Dispense Refill   Alcohol Swabs (B-D SINGLE USE SWABS REGULAR) PADS Use as directed once daily E11.9 100 each 3   amLODipine (NORVASC) 5 MG tablet TAKE 1 TABLET EVERY DAY 90 tablet 3   aspirin 81 MG EC tablet Take 81 mg by mouth daily.     Blood Glucose Calibration (TRUE METRIX LEVEL 1) Low SOLN Use as directed once daily E11.9 1 each 3   Blood Glucose Monitoring Suppl (TRUE METRIX AIR GLUCOSE METER) w/Device KIT USE AS DIRECTED 1 kit 0   Cholecalciferol (VITAMIN D-3) 25 MCG (1000 UT) CAPS Take 2,000 Units by mouth daily.     clopidogrel (PLAVIX) 75 MG tablet TAKE 1 TABLET EVERY DAY 90 tablet 10   DROPLET PEN NEEDLES 32G X 4 MM MISC      Evolocumab (REPATHA SURECLICK) 140 MG/ML SOAJ Inject 140 mg into the skin every 14 (fourteen) days. 6 mL 3   fenofibrate (TRICOR) 145 MG tablet TAKE 1 TABLET EVERY DAY 90 tablet 10   glipiZIDE (GLUCOTROL XL) 10 MG 24 hr tablet TAKE 1 TABLET EVERY DAY WITH BREAKFAST 90 tablet 3   glucosamine-chondroitin 500-400 MG tablet Take 1 tablet by mouth daily.     glucose blood (RELION TRUE METRIX TEST STRIPS) test strip Use as instructed once daily E11.9 100 each 12   levothyroxine (SYNTHROID) 25 MCG tablet TAKE 1 TABLET EVERY DAY (NEED MD APPOINTMENT) 90 tablet 10   metFORMIN (GLUCOPHAGE-XR) 500 MG 24 hr tablet TAKE 4 TABLETS EVERY DAY WITH BREAKFAST 360 tablet 10   metoprolol succinate (TOPROL-XL) 100 MG 24 hr tablet TAKE 1 TABLET TWICE DAILY. TAKE WITH OR IMMEDIATELY FOLLOWING A MEAL. 180 tablet 10   olmesartan (BENICAR) 40 MG tablet TAKE 1 TABLET EVERY DAY 90 tablet 10   Omega-3 Fatty Acids (FISH OIL PO) Take 1 tablet by mouth daily.      pantoprazole (PROTONIX) 40 MG tablet TAKE 1 TABLET EVERY DAY 90 tablet 10   pioglitazone (ACTOS) 30 MG tablet Take 1 tablet (30 mg total) by mouth daily. 90 tablet 3   TRUEplus Lancets 33G MISC Use as directed once daily E11.9 100 each 12    tadalafil (CIALIS) 20 MG tablet Take 1 tablet (20 mg total) by mouth daily as needed for erectile dysfunction. 30 tablet 11   No current facility-administered medications on file prior to visit.        ROS:  All others reviewed and negative.  Objective        PE:  BP 122/74 (BP Location: Right Arm, Patient Position: Sitting, Cuff Size: Normal)   Pulse  70   Temp 98.5 F (36.9 C) (Oral)   Ht 5' 10.5" (1.791 m)   Wt 255 lb (115.7 kg)   SpO2 98%   BMI 36.07 kg/m                 Constitutional: Pt appears in NAD               HENT: Head: NCAT.                Right Ear: External ear normal.                 Left Ear: External ear normal.                Eyes: . Pupils are equal, round, and reactive to light. Conjunctivae and EOM are normal               Nose: without d/c or deformity               Neck: Neck supple. Gross normal ROM               Cardiovascular: Normal rate and regular rhythm.                 Pulmonary/Chest: Effort normal and breath sounds without rales or wheezing.                              Neurological: Pt is alert. At baseline orientation, motor grossly intact               Skin: Skin is warm. No rashes, no other new lesions, LE edema - non, left elbow with olecranon mild tender swelling               Psychiatric: Pt behavior is normal without agitation   Micro: none  Cardiac tracings I have personally interpreted today:  none  Pertinent Radiological findings (summarize): none   Lab Results  Component Value Date   WBC 7.4 10/31/2022   HGB 14.7 10/31/2022   HCT 43.1 10/31/2022   PLT 228.0 10/31/2022   GLUCOSE 96 05/15/2023   CHOL 182 05/15/2023   TRIG 146.0 05/15/2023   HDL 29.70 (L) 05/15/2023   LDLDIRECT 127.0 11/15/2019   LDLCALC 123 (H) 05/15/2023   ALT 12 05/15/2023   AST 14 05/15/2023   NA 138 05/15/2023   K 4.1 05/15/2023   CL 103 05/15/2023   CREATININE 1.34 05/15/2023   BUN 16 05/15/2023   CO2 26 05/15/2023   TSH 5.33 10/31/2022   PSA  1.08 10/31/2022   INR 1.1 (H) 07/09/2016   HGBA1C 6.5 05/15/2023   MICROALBUR <0.7 10/31/2022   Assessment/Plan:  Keith [name] is a 67 y.o. White or Caucasian [1] male with  has a past medical history of Anxiety, Arthritis, Cervical spine pain, Chronic HFimpEF (heart failure with improved ejection fraction) (HCC), Diabetes mellitus, Diverticulosis, Fatty liver, GERD (gastroesophageal reflux disease), Hereditary hemochromatosis, History of colonic polyps, History of NICM (nonischemic cardiomyopathy) w/ subsequent improvement in LV function(HCC), Hyperlipidemia, Hypertension, Hypothyroidism, Low back pain, Multifocal atrial tachycardia (HCC), Nonobstructive CAD (coronary artery disease), Peptic ulcer disease, Thyroid nodule, and Wandering atrial pacemaker.  Olecranon bursitis of left elbow Mild, benign appearing inflammation due to irritation from pressure injury - for otc volt gel prn, no more pressure on the elbow,  to f/u any worsening symptoms or concerns  Insulin dependent type 2 diabetes mellitus, controlled (  HCC) Lab Results  Component Value Date   HGBA1C 6.5 05/15/2023   Stable, pt to continue current medical treatment but also start mounjaro for wt loss and sugar in jan 2025 after donut hole removed   Hyperlipidemia Lab Results  Component Value Date   LDLCALC 123 (H) 05/15/2023   Stable, pt to start repatha after jan 1 when donut hole is not in effect    Erectile dysfunction Worsening, for restart viagra prn,  to f/u any worsening symptoms or concerns  Followup: Return if symptoms worsen or fail to improve.  Oliver Barre, MD 07/21/2023 8:14 PM [facility] [place] [facility] - [facility]

## 2023-07-21 NOTE — Assessment & Plan Note (Signed)
Worsening, for restart viagra prn,  to f/u any worsening symptoms or concerns

## 2023-07-21 NOTE — Patient Instructions (Addendum)
Please stay off the left elbow and it will heal in 1-2 months  You can also use the OTC Voltaren gel as needed for this as well  Please take all new medication as prescribed - the viagra as needed  Please call or Mychart Message after Jan 1 if you would like to start the Repatha (for cholesterol) and/or the Mounjaro (for sugar and wt loss), as the Donut hole should be going away next year  Please continue all other medications as before, and refills have been done if requested.  Please have the pharmacy call with any other refills you may need.  Please keep your appointments with your specialists as you may have planned  Please make an Appointment to return in Mar 2025, or sooner if needed

## 2023-07-21 NOTE — Assessment & Plan Note (Signed)
Lab Results  Component Value Date   HGBA1C 6.5 05/15/2023   Stable, pt to continue current medical treatment but also start mounjaro for wt loss and sugar in jan 2025 after donut hole removed

## 2023-07-31 ENCOUNTER — Telehealth: Payer: Self-pay | Admitting: Internal Medicine

## 2023-07-31 NOTE — Telephone Encounter (Signed)
Pt called stating that Dr. Jonny Ruiz was going to start pt on mounjaro on the first of the year and pt wants to know the dosage to see will his insurance will cover it. Pt is asking for a call back from his nurse.

## 2023-07-31 NOTE — Telephone Encounter (Signed)
This is always started at 2.5 mg weekly, thanks

## 2023-08-03 NOTE — Telephone Encounter (Signed)
Called and unable to leave voicemail

## 2023-08-11 DIAGNOSIS — E119 Type 2 diabetes mellitus without complications: Secondary | ICD-10-CM | POA: Diagnosis not present

## 2023-08-11 DIAGNOSIS — H35033 Hypertensive retinopathy, bilateral: Secondary | ICD-10-CM | POA: Diagnosis not present

## 2023-08-20 DIAGNOSIS — H524 Presbyopia: Secondary | ICD-10-CM | POA: Diagnosis not present

## 2023-09-02 ENCOUNTER — Other Ambulatory Visit: Payer: Self-pay | Admitting: Internal Medicine

## 2023-09-03 ENCOUNTER — Other Ambulatory Visit: Payer: Self-pay

## 2023-09-21 ENCOUNTER — Other Ambulatory Visit: Payer: Self-pay

## 2023-09-21 ENCOUNTER — Other Ambulatory Visit (HOSPITAL_COMMUNITY): Payer: Self-pay

## 2023-09-21 ENCOUNTER — Telehealth: Payer: Self-pay

## 2023-09-21 MED ORDER — TIRZEPATIDE 2.5 MG/0.5ML ~~LOC~~ SOAJ
2.5000 mg | SUBCUTANEOUS | 3 refills | Status: DC
  Filled 2023-09-21 – 2023-09-22 (×2): qty 2, 28d supply, fill #0
  Filled 2023-10-13: qty 2, 28d supply, fill #1
  Filled 2023-11-11: qty 2, 28d supply, fill #2

## 2023-09-21 MED ORDER — TIRZEPATIDE 2.5 MG/0.5ML ~~LOC~~ SOAJ: 2.5000 mg | SUBCUTANEOUS | 3 refills | Status: DC

## 2023-09-21 NOTE — Addendum Note (Signed)
 Addended by: Corwin Levins on: 09/21/2023 01:14 PM   Modules accepted: Orders

## 2023-09-21 NOTE — Telephone Encounter (Signed)
 Called and let Pt know , also resent prescription to preferred pharmacy per patient.

## 2023-09-21 NOTE — Telephone Encounter (Signed)
 Copied from CRM [id number]. Topic: Clinical - Medication Question >> Sep 21, 2023 11:42 AM Keith [name] wrote: Reason for CRM: Patient says Dr. Norleen told him he would like to start on the Mounjaro  the beginning of this year, patient wants to know if he can go ahead and get started on it. Patient no longer uses [facility] and would like all future prescriptions sent to [facility]

## 2023-09-21 NOTE — Telephone Encounter (Signed)
 Ok for this - done erx to [facility]   Ok to STOP the glucotrol if he starts having lower sugars with taking the mounjaro and gets wt loss - please let pt know

## 2023-09-22 ENCOUNTER — Other Ambulatory Visit (HOSPITAL_COMMUNITY): Payer: Self-pay

## 2023-09-22 ENCOUNTER — Other Ambulatory Visit: Payer: Self-pay

## 2023-09-24 ENCOUNTER — Other Ambulatory Visit (HOSPITAL_COMMUNITY): Payer: Self-pay

## 2023-09-30 ENCOUNTER — Other Ambulatory Visit: Payer: Self-pay | Admitting: Internal Medicine

## 2023-09-30 NOTE — Telephone Encounter (Signed)
 Copied from CRM [id number]. Topic: Clinical - Prescription Issue >> Sep 30, 2023  1:57 PM Tisa Forester wrote: Reason for CRM: lokesh from [facility] Ad[employer]age regarding member please call [phone] option 2  need additonal informaton for  the prior authorization on the tirzepatide  (MOUNJARO ) 2.5 MG/0.5ML Pen ,  Example need lab report and diagnosis for diabetes  , please fax lab report to  [phone] and please call also

## 2023-10-12 ENCOUNTER — Telehealth: Payer: Self-pay

## 2023-10-12 ENCOUNTER — Other Ambulatory Visit (HOSPITAL_COMMUNITY): Payer: Self-pay

## 2023-10-12 NOTE — Telephone Encounter (Signed)
Pharmacy Patient Advocate Encounter   Received notification from Pt Calls Messages that prior authorization for Mounjaro 2.5mg /0.40ml is required/requested.   Insurance verification completed.   The patient is insured through [facility] ADVANTAGE/RX ADVANCE .   Per test claim: Refill too soon, next fill 10/13/23.   Placed a call to the insurance, per the representative a prior authorization is on file from 10/07/23 to 10/06/24 and the medication can be refilled on 10/13/23.

## 2023-10-12 NOTE — Telephone Encounter (Signed)
Copied from CRM #[id number]. Topic: Clinical - Prescription Issue >> Oct 12, 2023  8:50 AM Theodis Sato wrote: Reason for CRM: Patients insurance agent Monia Sabal is calling for an update on patients Mounjaro prior authorization as he is almost out of this medication. Tawanna Cooler states this has been an Scientist, forensic for weeks. Patient is requesting a confirmation phone call once the prior authorization is complete.

## 2023-10-13 ENCOUNTER — Other Ambulatory Visit (HOSPITAL_COMMUNITY): Payer: Self-pay

## 2023-10-15 DIAGNOSIS — Z7982 Long term (current) use of aspirin: Secondary | ICD-10-CM | POA: Diagnosis not present

## 2023-10-15 DIAGNOSIS — E785 Hyperlipidemia, unspecified: Secondary | ICD-10-CM | POA: Diagnosis not present

## 2023-10-15 DIAGNOSIS — E1169 Type 2 diabetes mellitus with other specified complication: Secondary | ICD-10-CM | POA: Diagnosis not present

## 2023-10-15 DIAGNOSIS — E669 Obesity, unspecified: Secondary | ICD-10-CM | POA: Diagnosis not present

## 2023-10-15 DIAGNOSIS — M1990 Unspecified osteoarthritis, unspecified site: Secondary | ICD-10-CM | POA: Diagnosis not present

## 2023-10-15 DIAGNOSIS — K219 Gastro-esophageal reflux disease without esophagitis: Secondary | ICD-10-CM | POA: Diagnosis not present

## 2023-10-15 DIAGNOSIS — G8929 Other chronic pain: Secondary | ICD-10-CM | POA: Diagnosis not present

## 2023-10-15 DIAGNOSIS — Z7902 Long term (current) use of antithrombotics/antiplatelets: Secondary | ICD-10-CM | POA: Diagnosis not present

## 2023-10-15 DIAGNOSIS — I10 Essential (primary) hypertension: Secondary | ICD-10-CM | POA: Diagnosis not present

## 2023-11-13 ENCOUNTER — Other Ambulatory Visit: Payer: PPO

## 2023-11-13 DIAGNOSIS — E538 Deficiency of other specified B group vitamins: Secondary | ICD-10-CM

## 2023-11-13 DIAGNOSIS — Z794 Long term (current) use of insulin: Secondary | ICD-10-CM | POA: Diagnosis not present

## 2023-11-13 DIAGNOSIS — Z125 Encounter for screening for malignant neoplasm of prostate: Secondary | ICD-10-CM | POA: Diagnosis not present

## 2023-11-13 DIAGNOSIS — E559 Vitamin D deficiency, unspecified: Secondary | ICD-10-CM | POA: Diagnosis not present

## 2023-11-13 DIAGNOSIS — E119 Type 2 diabetes mellitus without complications: Secondary | ICD-10-CM | POA: Diagnosis not present

## 2023-11-13 LAB — LIPID PANEL
Cholesterol: 165 mg/dL (ref 0–200)
HDL: 27.4 mg/dL — ABNORMAL LOW (ref >39.00)
LDL Cholesterol: 112 mg/dL — ABNORMAL HIGH (ref 0–99)
NonHDL: 137.92
Total CHOL/HDL Ratio: 6
Triglycerides: 130 mg/dL (ref 0.0–149.0)
VLDL: 26 mg/dL (ref 0.0–40.0)

## 2023-11-13 LAB — BASIC METABOLIC PANEL
BUN: 20 mg/dL (ref 6–23)
CO2: 25 meq/L (ref 19–32)
Calcium: 9.2 mg/dL (ref 8.4–10.5)
Chloride: 101 meq/L (ref 96–112)
Creatinine, Ser: 1.35 mg/dL (ref 0.40–1.50)
GFR: 54.67 mL/min — ABNORMAL LOW (ref >60.00)
Glucose, Bld: 140 mg/dL — ABNORMAL HIGH (ref 70–99)
Potassium: 4.3 meq/L (ref 3.5–5.1)
Sodium: 135 meq/L (ref 135–145)

## 2023-11-13 LAB — HEPATIC FUNCTION PANEL
ALT: 10 U/L (ref 0–53)
AST: 12 U/L (ref 0–37)
Albumin: 4.2 g/dL (ref 3.5–5.2)
Alkaline Phosphatase: 39 U/L (ref 39–117)
Bilirubin, Direct: 0.1 mg/dL (ref 0.0–0.3)
Total Bilirubin: 0.5 mg/dL (ref 0.2–1.2)
Total Protein: 6.9 g/dL (ref 6.0–8.3)

## 2023-11-13 LAB — URINALYSIS, ROUTINE W REFLEX MICROSCOPIC
Bilirubin Urine: NEGATIVE
Hgb urine dipstick: NEGATIVE
Ketones, ur: NEGATIVE
Leukocytes,Ua: NEGATIVE
Nitrite: NEGATIVE
Specific Gravity, Urine: 1.02 (ref 1.000–1.030)
Total Protein, Urine: NEGATIVE
Urine Glucose: 500 — AB
Urobilinogen, UA: 1 (ref 0.0–1.0)
WBC, UA: NONE SEEN — AB (ref 0–?)
pH: 6 (ref 5.0–8.0)

## 2023-11-13 LAB — MICROALBUMIN / CREATININE URINE RATIO
Creatinine,U: 155.2 mg/dL
Microalb Creat Ratio: 4.5 mg/g (ref 0.0–30.0)
Microalb, Ur: <0.7 mg/dL (ref 0.0–1.9)

## 2023-11-13 LAB — CBC WITH DIFFERENTIAL/PLATELET
Basophils Absolute: 0.1 10*3/uL (ref 0.0–0.1)
Basophils Relative: 0.7 % (ref 0.0–3.0)
Eosinophils Absolute: 0.2 10*3/uL (ref 0.0–0.7)
Eosinophils Relative: 2.5 % (ref 0.0–5.0)
HCT: 42.2 % (ref 39.0–52.0)
Hemoglobin: 14.2 g/dL (ref 13.0–17.0)
Lymphocytes Relative: 27 % (ref 12.0–46.0)
Lymphs Abs: 2 10*3/uL (ref 0.7–4.0)
MCHC: 33.6 g/dL (ref 30.0–36.0)
MCV: 90 fl (ref 78.0–100.0)
Monocytes Absolute: 0.7 10*3/uL (ref 0.1–1.0)
Monocytes Relative: 9.5 % (ref 3.0–12.0)
Neutro Abs: 4.5 10*3/uL (ref 1.4–7.7)
Neutrophils Relative %: 60.3 % (ref 43.0–77.0)
Platelets: 236 10*3/uL (ref 150.0–400.0)
RBC: 4.69 Mil/uL (ref 4.22–5.81)
RDW: 14.3 % (ref 11.5–15.5)
WBC: 7.5 10*3/uL (ref 4.0–10.5)

## 2023-11-13 LAB — VITAMIN D 25 HYDROXY (VIT D DEFICIENCY, FRACTURES): VITD: 32.6 ng/mL (ref 30.00–100.00)

## 2023-11-13 LAB — HEMOGLOBIN A1C: Hgb A1c MFr Bld: 6.8 % — ABNORMAL HIGH (ref 4.6–6.5)

## 2023-11-13 LAB — VITAMIN B12: Vitamin B-12: 192 pg/mL — ABNORMAL LOW (ref 211–911)

## 2023-11-13 LAB — TSH: TSH: 4.58 u[IU]/mL (ref 0.35–5.50)

## 2023-11-13 LAB — PSA: PSA: 1.05 ng/mL (ref 0.10–4.00)

## 2023-11-16 ENCOUNTER — Other Ambulatory Visit: Payer: Self-pay

## 2023-11-16 ENCOUNTER — Other Ambulatory Visit (HOSPITAL_COMMUNITY): Payer: Self-pay

## 2023-11-16 ENCOUNTER — Ambulatory Visit (INDEPENDENT_AMBULATORY_CARE_PROVIDER_SITE_OTHER): Payer: Medicare HMO | Admitting: Internal Medicine

## 2023-11-16 ENCOUNTER — Encounter: Payer: Self-pay | Admitting: Internal Medicine

## 2023-11-16 VITALS — BP 120/78 | HR 67 | Temp 98.1°F | Ht 70.5 in | Wt 250.0 lb

## 2023-11-16 DIAGNOSIS — N62 Hypertrophy of breast: Secondary | ICD-10-CM

## 2023-11-16 DIAGNOSIS — Z794 Long term (current) use of insulin: Secondary | ICD-10-CM

## 2023-11-16 DIAGNOSIS — Z0001 Encounter for general adult medical examination with abnormal findings: Secondary | ICD-10-CM

## 2023-11-16 DIAGNOSIS — E538 Deficiency of other specified B group vitamins: Secondary | ICD-10-CM | POA: Diagnosis not present

## 2023-11-16 DIAGNOSIS — E559 Vitamin D deficiency, unspecified: Secondary | ICD-10-CM | POA: Diagnosis not present

## 2023-11-16 DIAGNOSIS — Z0000 Encounter for general adult medical examination without abnormal findings: Secondary | ICD-10-CM | POA: Diagnosis not present

## 2023-11-16 DIAGNOSIS — E119 Type 2 diabetes mellitus without complications: Secondary | ICD-10-CM | POA: Diagnosis not present

## 2023-11-16 DIAGNOSIS — E7800 Pure hypercholesterolemia, unspecified: Secondary | ICD-10-CM | POA: Diagnosis not present

## 2023-11-16 MED ORDER — REPATHA SURECLICK 140 MG/ML ~~LOC~~ SOAJ
140.0000 mg | SUBCUTANEOUS | 3 refills | Status: AC
  Filled 2023-11-16 – 2023-12-08 (×3): qty 2, 28d supply, fill #0
  Filled 2023-12-18 – 2023-12-19 (×2): qty 6, 84d supply, fill #0
  Filled 2024-03-08: qty 6, 84d supply, fill #1
  Filled 2024-05-31: qty 6, 84d supply, fill #2
  Filled 2024-08-19 – 2024-09-22 (×4): qty 6, 84d supply, fill #3

## 2023-11-16 NOTE — Patient Instructions (Addendum)
 Please have your Shingrix (shingles) shots done at your local pharmacy.  Ok to stay off the glipizide as you have stopped  Please call if you would want the higher dose of mounjaro 5 mg  Please take all new medication as prescribed - the Repatha 140 mg every 2 wks  Ok to increase the Vitamin D to 4000 units per day  Please take all new medication as recommended - the OTC B12 1000 mcg per day  Please continue all other medications as before, and refills have been done if requested.  Please have the pharmacy call with any other refills you may need.  Please continue your efforts at being more active, low cholesterol diet, and weight control.  You are otherwise up to date with prevention measures today.  Please keep your appointments with your specialists as you may have planned  Please call if you change your mind about referral to Endocrinology or mammogram  Please make an Appointment to return in 6 months, or sooner if needed, also with Lab Appointment for testing done 3-5 days before at the FIRST FLOOR Lab (so this is for TWO appointments - please see the scheduling desk as you leave)

## 2023-11-16 NOTE — Progress Notes (Unsigned)
 Patient ID: Keith [name], male   DOB: 08-13-1956, 68 y.o.   MRN: [id number]         Chief Complaint:: wellness exam and gynecomastia, dm, hld, low vit d and b12       HPI:  Keith [name] is a 68 y.o. male here for wellness exam; for shingix at pharmacy, declines flu shot, o/w up to date                        Also Pt denies chest pain, increased sob or doe, wheezing, orthopnea, PND, increased LE swelling, palpitations, dizziness or syncope.   Pt denies polydipsia, polyuria, or new focal neuro s/s.    Pt denies fever, wt loss, night sweats, loss of appetite, or other constitutional symptoms  Has right breast tender swelling soreness.     Wt Readings from Last 3 Encounters:  11/16/23 250 lb (113.4 kg)  07/21/23 255 lb (115.7 kg)  05/19/23 255 lb (115.7 kg)   BP Readings from Last 3 Encounters:  11/16/23 120/78  07/21/23 122/74  05/19/23 134/80   Immunization History  Administered Date(s) Administered   Influenza Whole 11/08/2009   Influenza,inj,Quad PF,6+ Mos 05/12/2013, 08/31/2014, 05/06/2017, 06/24/2018, 05/19/2019   Influenza-Unspecified 07/19/2015, 06/24/2018   Pneumococcal Conjugate-13 10/26/2014   Pneumococcal Polysaccharide-23 05/12/2013, 04/22/2022   Td 09/15/1997, 11/02/2008   Tdap 05/19/2019   Health Maintenance Due  Topic Date Due   Zoster Vaccines- Shingrix (1 of 2) Never done      Past Medical History:  Diagnosis Date   Anxiety    Arthritis    Cervical spine pain    Chronic   Chronic HFimpEF (heart failure with improved ejection fraction) (HCC)    a. 01/2010 Echo: EF 30%, diff HK; b. 08/2014 Echo: EF 50%, no rwma, GrI DD.   Diabetes mellitus    Diverticulosis    Fatty liver    GERD (gastroesophageal reflux disease)    Hereditary hemochromatosis    Compound heterozygote. Pt gets periodic phlebotomies   History of colonic polyps    History of NICM (nonischemic cardiomyopathy) w/ subsequent improvement in LV function(HCC)    a. Echo (5/11) showed EF  30% with diffuse hypokinesis, normal wall thickness, mildly decreased RV systolic function. This may be a cardiomyopathy due to hemochromatosis versus tachy-mediated; b. 08/2014 Echo: EF 50%, no rwma, GrI DD.   Hyperlipidemia    Hypertension    Hypothyroidism    Low back pain    The pt is S/P spinal fusion surgeries   Multifocal atrial tachycardia (HCC)    a. Holter monior in 5/11 showed frequent runs of symtomatic MAT with heart rate around 100-->prev on amio-->now on bb only.   Nonobstructive CAD (coronary artery disease)    a. 11/2008 MV: EF 50%, Inf HK, inferior scar and ischemia in the apical anterior septum and in the inferior wall; b. 11/2008 Cath: LM nl, LAD min irregs, LCX nl, OM1/2 nl, RCA nl.   Peptic ulcer disease    Thyroid nodule    Wandering atrial pacemaker    Past Surgical History:  Procedure Laterality Date   CORONARY STENT INTERVENTION [name] 09/06/2021   Procedure: CORONARY STENT INTERVENTION;  Surgeon: Lyn Records, MD;  Location: [facility];  Service: Cardiovascular;  Laterality: [name];   INGUINAL HERNIA REPAIR Right    LEFT HEART CATH AND CORONARY ANGIOGRAPHY [name] 09/06/2021   Procedure: LEFT HEART CATH AND CORONARY ANGIOGRAPHY;  Surgeon: Lyn Records, MD;  Location: [facility];  Service: Cardiovascular;  Laterality: [name];   LUMBAR DISC SURGERY  09/15/1989   TONSILLECTOMY      reports that he quit smoking about 20 years ago. His smoking use included cigarettes. He has never used smokeless tobacco. He reports that he does not currently use alcohol. He reports that he does not currently use drugs. family history includes Alcohol abuse in his father; Colitis in his mother; Colon polyps in his mother; Diabetes in his mother, paternal grandfather, and paternal grandmother; Hypertension in his father; Multiple sclerosis in his mother and sister. Allergies  Allergen Reactions   Dicyclomine Nausea And Vomiting and Other (See Comments)    Lightheaded, dizziness,  SOB   Doxycycline Nausea Only    Weak, fatigue, "sick on stomach"   Lipitor [Atorvastatin]     myalgias   Penicillins Other (See Comments)    Childhood allergy - unknown reaction   Reglan [Metoclopramide] Other (See Comments)    Unknown reaction   Zocor [Simvastatin] Other (See Comments)    Myalgia    Zoloft [Sertraline Hcl] Other (See Comments)    Unknown reaction    Current Outpatient Medications on File Prior to Visit  Medication Sig Dispense Refill   Alcohol Swabs (B-D SINGLE USE SWABS REGULAR) PADS Use as directed once daily E11.9 100 each 3   amLODipine (NORVASC) 5 MG tablet TAKE 1 TABLET EVERY DAY 90 tablet 3   aspirin 81 MG EC tablet Take 81 mg by mouth daily.     Blood Glucose Calibration (TRUE METRIX LEVEL 1) Low SOLN Use as directed once daily E11.9 1 each 3   Blood Glucose Monitoring Suppl (TRUE METRIX AIR GLUCOSE METER) w/Device KIT USE AS DIRECTED 1 kit 0   Cholecalciferol (VITAMIN D-3) 25 MCG (1000 UT) CAPS Take 2,000 Units by mouth daily.     clopidogrel (PLAVIX) 75 MG tablet TAKE 1 TABLET EVERY DAY 90 tablet 3   DROPLET PEN NEEDLES 32G X 4 MM MISC      fenofibrate (TRICOR) 145 MG tablet TAKE 1 TABLET EVERY DAY 90 tablet 3   glucosamine-chondroitin 500-400 MG tablet Take 1 tablet by mouth daily.     glucose blood (RELION TRUE METRIX TEST STRIPS) test strip Use as instructed once daily E11.9 100 each 12   levothyroxine (SYNTHROID) 25 MCG tablet TAKE 1 TABLET EVERY DAY (NEED MD APPOINTMENT) 90 tablet 3   metFORMIN (GLUCOPHAGE-XR) 500 MG 24 hr tablet TAKE 4 TABLETS EVERY DAY WITH BREAKFAST 360 tablet 3   metoprolol succinate (TOPROL-XL) 100 MG 24 hr tablet TAKE 1 TABLET TWICE DAILY. TAKE WITH OR IMMEDIATELY FOLLOWING A MEAL. 180 tablet 3   olmesartan (BENICAR) 40 MG tablet TAKE 1 TABLET EVERY DAY 90 tablet 3   Omega-3 Fatty Acids (FISH OIL PO) Take 1 tablet by mouth daily.      pantoprazole (PROTONIX) 40 MG tablet TAKE 1 TABLET EVERY DAY 90 tablet 3   pioglitazone  (ACTOS) 30 MG tablet TAKE 1 TABLET EVERY DAY 90 tablet 3   sildenafil (VIAGRA) 100 MG tablet Take 0.5-1 tablets (50-100 mg total) by mouth daily as needed for erectile dysfunction. 30 tablet 5   tirzepatide (MOUNJARO) 2.5 MG/0.5ML Pen Inject 2.5 mg into the skin once a week. 6 mL 3   TRUEplus Lancets 33G MISC Use as directed once daily E11.9 100 each 12   tadalafil (CIALIS) 20 MG tablet Take 1 tablet (20 mg total) by mouth daily as needed for erectile dysfunction. 30 tablet  11   No current facility-administered medications on file prior to visit.        ROS:  All others reviewed and negative.  Objective        PE:  BP 120/78 (BP Location: Right Arm, Patient Position: Sitting, Cuff Size: Normal)   Pulse 67   Temp 98.1 F (36.7 C) (Oral)   Ht 5' 10.5" (1.791 m)   Wt 250 lb (113.4 kg)   SpO2 98%   BMI 35.36 kg/m                 Constitutional: Pt appears in NAD               HENT: Head: NCAT.                Right Ear: External ear normal.                 Left Ear: External ear normal.                Eyes: . Pupils are equal, round, and reactive to light. Conjunctivae and EOM are normal               Nose: without d/c or deformity               Neck: Neck supple. Gross normal ROM               Cardiovascular: Normal rate and regular rhythm.                 Pulmonary/Chest: Effort normal and breath sounds without rales or wheezing.                Abd:  Soft, NT, ND, + BS, no organomegaly               Neurological: Pt is alert. At baseline orientation, motor grossly intact               Skin: Skin is warm. No rashes, no other new lesions, LE edema - none; has mild right gynecomastia tender without mass               Psychiatric: Pt behavior is normal without agitation   Micro: none  Cardiac tracings I have personally interpreted today:  none  Pertinent Radiological findings (summarize): none   Lab Results  Component Value Date   WBC 7.5 11/13/2023   HGB 14.2 11/13/2023   HCT  42.2 11/13/2023   PLT 236.0 11/13/2023   GLUCOSE 140 (H) 11/13/2023   CHOL 165 11/13/2023   TRIG 130.0 11/13/2023   HDL 27.40 (L) 11/13/2023   LDLDIRECT 127.0 11/15/2019   LDLCALC 112 (H) 11/13/2023   ALT 10 11/13/2023   AST 12 11/13/2023   NA 135 11/13/2023   K 4.3 11/13/2023   CL 101 11/13/2023   CREATININE 1.35 11/13/2023   BUN 20 11/13/2023   CO2 25 11/13/2023   TSH 4.58 11/13/2023   PSA 1.05 11/13/2023   INR 1.1 (H) 07/09/2016   HGBA1C 6.8 (H) 11/13/2023   MICROALBUR <0.7 11/13/2023   Assessment/Plan:  Keith [name] is a 68 y.o. White or Caucasian [1] male with  has a past medical history of Anxiety, Arthritis, Cervical spine pain, Chronic HFimpEF (heart failure with improved ejection fraction) (HCC), Diabetes mellitus, Diverticulosis, Fatty liver, GERD (gastroesophageal reflux disease), Hereditary hemochromatosis, History of colonic polyps, History of NICM (nonischemic cardiomyopathy) w/ subsequent improvement in LV function(HCC), Hyperlipidemia, Hypertension, Hypothyroidism, Low back  pain, Multifocal atrial tachycardia (HCC), Nonobstructive CAD (coronary artery disease), Peptic ulcer disease, Thyroid nodule, and Wandering atrial pacemaker.  Encounter for well adult exam with abnormal findings Age and sex appropriate education and counseling updated with regular exercise and diet Referrals for preventative services - none needed Immunizations addressed - delcines flu shot, for shingrix at pharmacy Smoking counseling  - none needed Evidence for depression or other mood disorder - none significant Most recent labs reviewed. I have personally reviewed and have noted: 1) the patient's medical and social history 2) The patient's current medications and supplements 3) The patient's height, weight, and BMI have been recorded in the chart   Gynecomastia Mild right sided, declines specific testing or endo referral or mammogram  Insulin dependent type 2 diabetes mellitus,  controlled (HCC) With recent wt loss and severwal ow sugars, d/c glipizide, cont mounjaro 2.5 mg weekly for now but consider increase when pt ready  Hyperlipidemia Lab Results  Component Value Date   LDLCALC 112 (H) 11/13/2023   uncontrolled pt to continue tricor 145 mg every day, add repatha 140 mg every 2 wks   B12 deficiency Lab Results  Component Value Date   VITAMINB12 192 (L) 11/13/2023   Low, to start oral replacement - b12 1000 mcg qd   Vitamin D deficiency disease Last vitamin D Lab Results  Component Value Date   VD25OH 32.60 11/13/2023   Low, for increased oral replacement vit D3 to 4000 u  qd  Followup: Return in about 6 months (around 05/18/2024).  Oliver Barre, MD 11/18/2023 7:12 AM [facility]

## 2023-11-17 ENCOUNTER — Other Ambulatory Visit: Payer: Self-pay

## 2023-11-18 ENCOUNTER — Encounter: Payer: Self-pay | Admitting: Internal Medicine

## 2023-11-18 DIAGNOSIS — E559 Vitamin D deficiency, unspecified: Secondary | ICD-10-CM | POA: Insufficient documentation

## 2023-11-18 DIAGNOSIS — N62 Hypertrophy of breast: Secondary | ICD-10-CM | POA: Insufficient documentation

## 2023-11-18 NOTE — Addendum Note (Signed)
 Addended by: Corwin Levins on: 11/18/2023 07:14 AM   Modules accepted: Orders

## 2023-11-18 NOTE — Assessment & Plan Note (Signed)
 Lab Results  Component Value Date   VITAMINB12 192 (L) 11/13/2023   Low, to start oral replacement - b12 1000 mcg qd

## 2023-11-18 NOTE — Assessment & Plan Note (Signed)
 Last vitamin D Lab Results  Component Value Date   VD25OH 32.60 11/13/2023   Low, for increased oral replacement vit D3 to 4000 u  qd

## 2023-11-18 NOTE — Assessment & Plan Note (Signed)
 Lab Results  Component Value Date   LDLCALC 112 (H) 11/13/2023   uncontrolled pt to continue tricor 145 mg every day, add repatha 140 mg every 2 wks

## 2023-11-18 NOTE — Assessment & Plan Note (Signed)
 Age and sex appropriate education and counseling updated with regular exercise and diet Referrals for preventative services - none needed Immunizations addressed - delcines flu shot, for shingrix at pharmacy Smoking counseling  - none needed Evidence for depression or other mood disorder - none significant Most recent labs reviewed. I have personally reviewed and have noted: 1) the patient's medical and social history 2) The patient's current medications and supplements 3) The patient's height, weight, and BMI have been recorded in the chart

## 2023-11-18 NOTE — Assessment & Plan Note (Signed)
 Mild right sided, declines specific testing or endo referral or mammogram

## 2023-11-18 NOTE — Assessment & Plan Note (Signed)
 With recent wt loss and severwal ow sugars, d/c glipizide, cont mounjaro 2.5 mg weekly for now but consider increase when pt ready

## 2023-11-20 ENCOUNTER — Other Ambulatory Visit: Payer: Self-pay

## 2023-11-30 ENCOUNTER — Telehealth: Payer: Self-pay

## 2023-11-30 ENCOUNTER — Other Ambulatory Visit: Payer: Self-pay | Admitting: Internal Medicine

## 2023-11-30 ENCOUNTER — Telehealth: Payer: Self-pay | Admitting: Internal Medicine

## 2023-11-30 ENCOUNTER — Other Ambulatory Visit (HOSPITAL_BASED_OUTPATIENT_CLINIC_OR_DEPARTMENT_OTHER): Payer: Self-pay | Admitting: Family

## 2023-11-30 ENCOUNTER — Other Ambulatory Visit (HOSPITAL_COMMUNITY): Payer: Self-pay

## 2023-11-30 DIAGNOSIS — I10 Essential (primary) hypertension: Secondary | ICD-10-CM

## 2023-11-30 NOTE — Telephone Encounter (Signed)
 Copied from CRM [id number]. Topic: Clinical - Prescription Issue >> Nov 30, 2023 11:40 AM Myrtice Lauth wrote: Reason for CRM: [employer] called regarding a prior authorization  for [employer] They are reguesting clinicals to be sent in order to approve the prior auth. Please fax clinicals to [phone] or call with verbal clinics at [phone]   Ref# [id number]

## 2023-12-01 ENCOUNTER — Other Ambulatory Visit (HOSPITAL_COMMUNITY): Payer: Self-pay

## 2023-12-01 ENCOUNTER — Telehealth: Payer: Self-pay

## 2023-12-01 ENCOUNTER — Other Ambulatory Visit: Payer: Self-pay

## 2023-12-01 MED ORDER — FENOFIBRATE 145 MG PO TABS
145.0000 mg | ORAL_TABLET | Freq: Every day | ORAL | 3 refills | Status: AC
  Filled 2023-12-01: qty 90, 90d supply, fill #0
  Filled 2024-02-22: qty 90, 90d supply, fill #1
  Filled 2024-05-23: qty 90, 90d supply, fill #2
  Filled 2024-08-19: qty 90, 90d supply, fill #3

## 2023-12-01 MED ORDER — METFORMIN HCL ER 500 MG PO TB24
2000.0000 mg | ORAL_TABLET | Freq: Every day | ORAL | 3 refills | Status: AC
  Filled 2023-12-01: qty 360, 90d supply, fill #0
  Filled 2024-02-22: qty 360, 90d supply, fill #1
  Filled 2024-05-23: qty 360, 90d supply, fill #2
  Filled 2024-08-19: qty 360, 90d supply, fill #3

## 2023-12-01 MED ORDER — PANTOPRAZOLE SODIUM 40 MG PO TBEC
40.0000 mg | DELAYED_RELEASE_TABLET | Freq: Every day | ORAL | 3 refills | Status: AC
  Filled 2023-12-01: qty 90, 90d supply, fill #0
  Filled 2024-02-22: qty 90, 90d supply, fill #1
  Filled 2024-05-23: qty 90, 90d supply, fill #2
  Filled 2024-08-19: qty 90, 90d supply, fill #3

## 2023-12-01 MED ORDER — METOPROLOL SUCCINATE ER 100 MG PO TB24
100.0000 mg | ORAL_TABLET | Freq: Two times a day (BID) | ORAL | 3 refills | Status: AC
  Filled 2023-12-01: qty 180, 90d supply, fill #0
  Filled 2024-02-22: qty 180, 90d supply, fill #1
  Filled 2024-05-23: qty 180, 90d supply, fill #2
  Filled 2024-08-19: qty 180, 90d supply, fill #3

## 2023-12-01 MED ORDER — PIOGLITAZONE HCL 30 MG PO TABS
30.0000 mg | ORAL_TABLET | Freq: Every day | ORAL | 3 refills | Status: AC
  Filled 2023-12-01: qty 90, 90d supply, fill #0
  Filled 2024-02-22: qty 90, 90d supply, fill #1
  Filled 2024-05-23: qty 90, 90d supply, fill #2
  Filled 2024-08-19: qty 90, 90d supply, fill #3

## 2023-12-01 MED ORDER — CLOPIDOGREL BISULFATE 75 MG PO TABS
75.0000 mg | ORAL_TABLET | Freq: Every day | ORAL | 3 refills | Status: AC
  Filled 2023-12-01: qty 90, 90d supply, fill #0
  Filled 2024-02-22: qty 90, 90d supply, fill #1
  Filled 2024-05-23: qty 90, 90d supply, fill #2
  Filled 2024-08-19: qty 90, 90d supply, fill #3

## 2023-12-01 MED ORDER — AMLODIPINE BESYLATE 5 MG PO TABS
5.0000 mg | ORAL_TABLET | Freq: Every day | ORAL | 1 refills | Status: DC
  Filled 2023-12-01: qty 90, 90d supply, fill #0
  Filled 2024-02-22: qty 90, 90d supply, fill #1

## 2023-12-01 NOTE — Telephone Encounter (Signed)
 Pharmacy Patient Advocate Encounter   Received notification from Pt Calls Messages that prior authorization for Repatha Sure click is required/requested.   Insurance verification completed.   The patient is insured through [facility] ADVANTAGE/RX ADVANCE .   Per test claim: The current 30 day co-pay is, $47.00.  No PA needed at this time. This test claim was processed through [facility]- copay amounts may vary at other pharmacies due to pharmacy/plan contracts, or as the patient moves through the different stages of their insurance plan.

## 2023-12-02 ENCOUNTER — Other Ambulatory Visit (HOSPITAL_COMMUNITY): Payer: Self-pay

## 2023-12-03 ENCOUNTER — Other Ambulatory Visit: Payer: Self-pay

## 2023-12-03 ENCOUNTER — Other Ambulatory Visit (HOSPITAL_COMMUNITY): Payer: Self-pay

## 2023-12-03 ENCOUNTER — Telehealth: Payer: Self-pay | Admitting: Internal Medicine

## 2023-12-03 MED FILL — Olmesartan Medoxomil Tab 40 MG: ORAL | 90 days supply | Qty: 90 | Fill #0 | Status: AC

## 2023-12-03 NOTE — Telephone Encounter (Signed)
Form placed on provider desk.

## 2023-12-03 NOTE — Telephone Encounter (Signed)
 Copied from CRM [id number]. Topic: Clinical - Prescription Issue >> Dec 03, 2023 10:15 AM Florestine Avers wrote: Reason for CRM: Patient called stating that Dr. Jonny Ruiz needs to contact [employer] regarding a prior authorization  for Repatha Sure Click. They are reguesting clinicals to be sent in order to approve the prior auth. Please fax clinicals to [phone] or call with verbal clinics at [phone]   Ref# [id number]

## 2023-12-07 ENCOUNTER — Other Ambulatory Visit: Payer: Self-pay

## 2023-12-07 ENCOUNTER — Other Ambulatory Visit (HOSPITAL_COMMUNITY): Payer: Self-pay

## 2023-12-07 NOTE — Telephone Encounter (Signed)
 Pharmacy Patient Advocate Encounter  Received notification from [facility] ADVANTAGE/RX ADVANCE that Prior Authorization for Repatha SureClick 140MG /ML auto-injectors has been DENIED.  Full denial letter will be uploaded to the media tab. See denial reason below.   PA #/Case ID/Reference #: [id number]

## 2023-12-08 ENCOUNTER — Other Ambulatory Visit (HOSPITAL_COMMUNITY): Payer: Self-pay

## 2023-12-10 ENCOUNTER — Other Ambulatory Visit (HOSPITAL_COMMUNITY): Payer: Self-pay

## 2023-12-14 ENCOUNTER — Other Ambulatory Visit (HOSPITAL_COMMUNITY): Payer: Self-pay

## 2023-12-14 ENCOUNTER — Telehealth: Payer: Self-pay | Admitting: Pharmacist

## 2023-12-14 NOTE — Telephone Encounter (Signed)
 Appeal has been submitted for Repatha. Will advise when response is received, please be advised that most companies may take at least 30 days to make a decision. Appeal letter and supporting documentation have been faxed to [phone] on 12/14/2023 @4 :35 pm.  Thank you, Dellie Burns, PharmD Clinical Pharmacist  [facility]  Direct Dial: [phone]

## 2023-12-15 NOTE — Telephone Encounter (Signed)
error 

## 2023-12-17 ENCOUNTER — Other Ambulatory Visit (HOSPITAL_COMMUNITY): Payer: Self-pay

## 2023-12-18 ENCOUNTER — Other Ambulatory Visit: Payer: Self-pay

## 2023-12-18 ENCOUNTER — Other Ambulatory Visit (HOSPITAL_COMMUNITY): Payer: Self-pay

## 2023-12-19 ENCOUNTER — Other Ambulatory Visit (HOSPITAL_BASED_OUTPATIENT_CLINIC_OR_DEPARTMENT_OTHER): Payer: Self-pay

## 2023-12-19 ENCOUNTER — Other Ambulatory Visit (HOSPITAL_COMMUNITY): Payer: Self-pay

## 2023-12-21 ENCOUNTER — Other Ambulatory Visit: Payer: Self-pay

## 2023-12-21 ENCOUNTER — Other Ambulatory Visit (HOSPITAL_COMMUNITY): Payer: Self-pay

## 2023-12-21 NOTE — Telephone Encounter (Signed)
 Pharmacy Patient Advocate Encounter  Received notification from [facility] ADVANTAGE/RX ADVANCE that Appeal for REPATHA has been APPROVED from 12/18/23 to 06/15/24   PA #/Case ID/Reference #: [id number]

## 2023-12-22 ENCOUNTER — Other Ambulatory Visit (HOSPITAL_COMMUNITY): Payer: Self-pay

## 2023-12-22 ENCOUNTER — Other Ambulatory Visit: Payer: Self-pay

## 2023-12-31 ENCOUNTER — Ambulatory Visit (INDEPENDENT_AMBULATORY_CARE_PROVIDER_SITE_OTHER)

## 2023-12-31 VITALS — Ht 70.0 in | Wt 250.0 lb

## 2023-12-31 DIAGNOSIS — Z0000 Encounter for general adult medical examination without abnormal findings: Secondary | ICD-10-CM | POA: Diagnosis not present

## 2023-12-31 NOTE — Progress Notes (Signed)
 Subjective:   Keith [name] is a 68 y.o. who presents for a Medicare Wellness preventive visit.  Visit Complete: Virtual I connected with  Keith [name] on 12/31/23 by a audio enabled telemedicine application and verified that I am speaking with the correct person using two identifiers.  Patient Location: Home  Provider Location: Office/Clinic  I discussed the limitations of evaluation and management by telemedicine. The patient expressed understanding and agreed to proceed.  Vital Signs: Because this visit was a virtual/telehealth visit, some criteria may be missing or patient reported. Any vitals not documented were not able to be obtained and vitals that have been documented are patient reported.  VideoDeclined- This patient declined Librarian, academic. Therefore the visit was completed with audio only.  Persons Participating in Visit: Patient.  AWV Questionnaire: No: Patient Medicare AWV questionnaire was not completed prior to this visit.  Cardiac Risk Factors include: advanced age (>50men, >51 women);diabetes mellitus;dyslipidemia;hypertension;obesity (BMI >30kg/m2);male gender     Objective:    Today's Vitals   12/31/23 0855  Weight: 250 lb (113.4 kg)  Height: 5\' 10"  (1.778 m)   Body mass index is 35.87 kg/m.     12/31/2023    8:53 AM 03/10/2023    9:33 AM 04/03/2022   11:52 AM 09/05/2021   10:15 PM 05/22/2020    8:44 AM 11/11/2017   10:08 AM  Advanced Directives  Does Patient Have a Medical Advance Directive? No No No No No No  Would patient like information on creating a medical advance directive? Yes (MAU/Ambulatory/Procedural Areas - Information given) Yes (ED - Information included in AVS) No - Patient declined  No - Patient declined No - Patient declined    Current Medications (verified) Outpatient Encounter Medications as of 12/31/2023  Medication Sig   Alcohol Swabs (B-D SINGLE USE SWABS REGULAR) PADS Use as directed once  daily E11.9   amLODipine (NORVASC) 5 MG tablet Take 1 tablet (5 mg total) by mouth daily.   aspirin 81 MG EC tablet Take 81 mg by mouth daily.   Blood Glucose Calibration (TRUE METRIX LEVEL 1) Low SOLN Use as directed once daily E11.9   Blood Glucose Monitoring Suppl (TRUE METRIX AIR GLUCOSE METER) w/Device KIT USE AS DIRECTED   Cholecalciferol (VITAMIN D-3) 25 MCG (1000 UT) CAPS Take 2,000 Units by mouth daily.   clopidogrel (PLAVIX) 75 MG tablet Take 1 tablet (75 mg total) by mouth daily.   DROPLET PEN NEEDLES 32G X 4 MM MISC    Evolocumab (REPATHA SURECLICK) 140 MG/ML SOAJ Inject 140 mg into the skin every 14 (fourteen) days.   fenofibrate (TRICOR) 145 MG tablet Take 1 tablet (145 mg total) by mouth daily.   glucosamine-chondroitin 500-400 MG tablet Take 1 tablet by mouth daily.   glucose blood (RELION TRUE METRIX TEST STRIPS) test strip Use as instructed once daily E11.9   levothyroxine (SYNTHROID) 25 MCG tablet TAKE 1 TABLET EVERY DAY (NEED MD APPOINTMENT)   metFORMIN (GLUCOPHAGE-XR) 500 MG 24 hr tablet Take 4 tablets (2,000 mg total) by mouth daily with breakfast.   metoprolol succinate (TOPROL-XL) 100 MG 24 hr tablet Take 1 tablet (100 mg total) by mouth 2 (two) times daily with or immediately following a meal.   olmesartan (BENICAR) 40 MG tablet Take 1 tablet (40 mg total) by mouth daily.   Omega-3 Fatty Acids (FISH OIL PO) Take 1 tablet by mouth daily.    pantoprazole (PROTONIX) 40 MG tablet Take 1 tablet (40 mg total)  by mouth daily.   pioglitazone (ACTOS) 30 MG tablet Take 1 tablet (30 mg total) by mouth daily.   sildenafil (VIAGRA) 100 MG tablet Take 0.5-1 tablets (50-100 mg total) by mouth daily as needed for erectile dysfunction.   TRUEplus Lancets 33G MISC Use as directed once daily E11.9   tadalafil (CIALIS) 20 MG tablet Take 1 tablet (20 mg total) by mouth daily as needed for erectile dysfunction.   tirzepatide (MOUNJARO) 2.5 MG/0.5ML Pen Inject 2.5 mg into the skin once a  week. (Patient not taking: Reported on 12/31/2023)   No facility-administered encounter medications on file as of 12/31/2023.    Allergies (verified) Dicyclomine, Doxycycline, Lipitor [atorvastatin], Penicillins, Reglan [metoclopramide], Zocor [simvastatin], and Zoloft [sertraline hcl]   History: Past Medical History:  Diagnosis Date   Anxiety    Arthritis    Cervical spine pain    Chronic   Chronic HFimpEF (heart failure with improved ejection fraction) (HCC)    a. 01/2010 Echo: EF 30%, diff HK; b. 08/2014 Echo: EF 50%, no rwma, GrI DD.   Diabetes mellitus    Diverticulosis    Fatty liver    GERD (gastroesophageal reflux disease)    Hereditary hemochromatosis    Compound heterozygote. Pt gets periodic phlebotomies   History of colonic polyps    History of NICM (nonischemic cardiomyopathy) w/ subsequent improvement in LV function(HCC)    a. Echo (5/11) showed EF 30% with diffuse hypokinesis, normal wall thickness, mildly decreased RV systolic function. This may be a cardiomyopathy due to hemochromatosis versus tachy-mediated; b. 08/2014 Echo: EF 50%, no rwma, GrI DD.   Hyperlipidemia    Hypertension    Hypothyroidism    Low back pain    The pt is S/P spinal fusion surgeries   Multifocal atrial tachycardia (HCC)    a. Holter monior in 5/11 showed frequent runs of symtomatic MAT with heart rate around 100-->prev on amio-->now on bb only.   Nonobstructive CAD (coronary artery disease)    a. 11/2008 MV: EF 50%, Inf HK, inferior scar and ischemia in the apical anterior septum and in the inferior wall; b. 11/2008 Cath: LM nl, LAD min irregs, LCX nl, OM1/2 nl, RCA nl.   Peptic ulcer disease    Thyroid nodule    Wandering atrial pacemaker    Past Surgical History:  Procedure Laterality Date   CORONARY STENT INTERVENTION [name] 09/06/2021   Procedure: CORONARY STENT INTERVENTION;  Surgeon: Arty Binning, MD;  Location: [facility];  Service: Cardiovascular;  Laterality: [name];    INGUINAL HERNIA REPAIR Right    LEFT HEART CATH AND CORONARY ANGIOGRAPHY [name] 09/06/2021   Procedure: LEFT HEART CATH AND CORONARY ANGIOGRAPHY;  Surgeon: Arty Binning, MD;  Location: [facility];  Service: Cardiovascular;  Laterality: [name];   LUMBAR DISC SURGERY  09/15/1989   TONSILLECTOMY     Family History  Problem Relation Age of Onset   Colon polyps Mother    Multiple sclerosis Mother    Diabetes Mother    Colitis Mother    Hypertension Father    Alcohol abuse Father    Multiple sclerosis Sister    Diabetes Paternal Grandmother    Diabetes Paternal Grandfather    Colon cancer Neg Hx    Social History   Socioeconomic History   Marital status: Married    Spouse name: Not on file   Number of children: 3   Years of education: Not on file   Highest education level: Not on  file  Occupational History   Occupation: Disabled    Employer: UNEMPLOYED  Tobacco Use   Smoking status: Former    Current packs/day: 0.00    Types: Cigarettes    Quit date: 09/16/2003    Years since quitting: 20.3    Passive exposure: Past   Smokeless tobacco: Never  Vaping Use   Vaping status: Never Used  Substance and Sexual Activity   Alcohol use: Not Currently    Comment: social - 1 drink every few months.   Drug use: Not Currently    Comment: Quit in 1980   Sexual activity: Yes  Other Topics Concern   Not on file  Social History Narrative   Lives in [place] by himself.  Does not routinely exercise.   Social Drivers of Corporate investment banker Strain: Low Risk  (12/31/2023)   Overall Financial Resource Strain (CARDIA)    Difficulty of Paying Living Expenses: Not hard at all  Food Insecurity: No Food Insecurity (12/31/2023)   Hunger Vital Sign    Worried About Running Out of Food in the Last Year: Never true    Ran Out of Food in the Last Year: Never true  Transportation Needs: No Transportation Needs (12/31/2023)   PRAPARE - Administrator, Civil Service  (Medical): No    Lack of Transportation (Non-Medical): No  Physical Activity: Inactive (12/31/2023)   Exercise Vital Sign    Days of Exercise per Week: 0 days    Minutes of Exercise per Session: 0 min  Stress: No Stress Concern Present (12/31/2023)   [employer] of Occupational Health - Occupational Stress Questionnaire    Feeling of Stress : Not at all  Social Connections: Moderately Isolated (12/31/2023)   Social Connection and Isolation Panel [NHANES]    Frequency of Communication with Friends and Family: More than three times a week    Frequency of Social Gatherings with Friends and Family: More than three times a week    Attends Religious Services: Never    Database administrator or Organizations: No    Attends Engineer, structural: Never    Marital Status: Married    Tobacco Counseling Counseling given: No    Clinical Intake:  Pre-visit preparation completed: Yes  Pain : No/denies pain     BMI - recorded: 35.87 Nutritional Risks: None Diabetes: Yes CBG done?: No Did pt. bring in CBG monitor from home?: No  Lab Results  Component Value Date   HGBA1C 6.8 (H) 11/13/2023   HGBA1C 6.5 05/15/2023   HGBA1C 6.4 10/31/2022     How often do you need to have someone help you when you read instructions, pamphlets, or other written materials from your doctor or pharmacy?: 1 - Never  Interpreter Needed?: No  Information entered by :: Kandy Orris, CMA   Activities of Daily Living     12/31/2023    8:58 AM 03/10/2023    9:19 AM  In your present state of health, do you have any difficulty performing the following activities:  Hearing? 0 0  Vision? 0 1  Difficulty concentrating or making decisions? 0 1  Walking or climbing stairs? 0 1  Dressing or bathing? 0 0  Doing errands, shopping? 0 0  Preparing Food and eating ? N N  Using the Toilet? N N  In the past six months, have you accidently leaked urine? N N  Do you have problems with loss of bowel  control? N N  Managing  your Medications? N N  Managing your Finances? N N  Housekeeping or managing your Housekeeping? N N    Patient Care Team: Corwin Levins, MD as PCP - General Jens Som Madolyn Frieze, MD as PCP - Cardiology (Cardiology)  Indicate any recent Medical Services you may have received from other than [facility] providers in the past year (date may be approximate).     Assessment:   This is a routine wellness examination for Zeven.  Hearing/Vision screen Hearing Screening - Comments:: Denies hearing difficulties   Vision Screening - Comments:: Wears rx glasses - up to date with routine eye exams with [facility]   Goals Addressed               This Visit's Progress     Patient Stated (pt-stated)        Patient stated he wants to continue do daily activities and be active.         Depression Screen     12/31/2023    9:03 AM 11/16/2023    9:47 AM 07/21/2023    3:37 PM 05/19/2023    8:41 AM 03/10/2023    9:31 AM 11/05/2022    8:42 AM 04/22/2022    9:58 AM  PHQ 2/9 Scores  PHQ - 2 Score 0 0 0 0 0 0 0  PHQ- 9 Score 7    0 0 5    Fall Risk     12/31/2023    8:59 AM 11/16/2023    9:52 AM 07/21/2023    3:37 PM 05/19/2023    8:41 AM 03/10/2023    9:38 AM  Fall Risk   Falls in the past year? 0 0 0 0 0  Number falls in past yr: 0 0 0 0 0  Injury with Fall? 0 0 0 0 0  Risk for fall due to : No Fall Risks No Fall Risks No Fall Risks No Fall Risks No Fall Risks;Impaired balance/gait  Follow up Falls prevention discussed;Falls evaluation completed Falls evaluation completed Falls evaluation completed Falls evaluation completed Falls prevention discussed    MEDICARE RISK AT HOME:  Medicare Risk at Home Any stairs in or around the home?: Yes If so, are there any without handrails?: No Home free of loose throw rugs in walkways, pet beds, electrical cords, etc?: Yes Adequate lighting in your home to reduce risk of falls?: Yes Life alert?: No Use of a cane, walker or w/c?: No Grab  bars in the bathroom?: No Shower chair or bench in shower?: No Elevated toilet seat or a handicapped toilet?: No  TIMED UP AND GO:  Was the test performed?  No  Cognitive Function: 6CIT completed        12/31/2023    9:05 AM 03/10/2023    9:46 AM 04/03/2022   11:53 AM 05/22/2020    8:46 AM  6CIT Screen  What Year? 0 points 0 points 0 points 0 points  What month? 0 points 0 points 0 points 0 points  What time? 0 points 0 points 0 points 0 points  Count back from 20 0 points 0 points 0 points 0 points  Months in reverse 0 points 0 points 0 points 0 points  Repeat phrase 2 points 4 points 0 points 0 points  Total Score 2 points 4 points 0 points 0 points    Immunizations Immunization History  Administered Date(s) Administered   Influenza Whole 11/08/2009   Influenza,inj,Quad PF,6+ Mos 05/12/2013, 08/31/2014, 05/06/2017, 06/24/2018, 05/19/2019   Influenza-Unspecified  07/19/2015, 06/24/2018   Pneumococcal Conjugate-13 10/26/2014   Pneumococcal Polysaccharide-23 05/12/2013, 04/22/2022   Td 09/15/1997, 11/02/2008   Tdap 05/19/2019    Screening Tests Health Maintenance  Topic Date Due   Zoster Vaccines- Shingrix (1 of 2) 03/31/2024 (Originally 04/01/2007)   INFLUENZA VACCINE  04/15/2024   HEMOGLOBIN A1C  05/12/2024   OPHTHALMOLOGY EXAM  08/23/2024   Diabetic kidney evaluation - eGFR measurement  11/12/2024   Diabetic kidney evaluation - Urine ACR  11/12/2024   FOOT EXAM  11/15/2024   Medicare Annual Wellness (AWV)  12/30/2024   Colonoscopy  08/01/2028   DTaP/Tdap/Td (4 - Td or Tdap) 05/18/2029   Pneumonia Vaccine 32+ Years old  Completed   Hepatitis C Screening  Completed   HPV VACCINES  Aged Out   Meningococcal B Vaccine  Aged Out   COVID-19 Vaccine  Discontinued    Health Maintenance  There are no preventive care reminders to display for this patient.  Health Maintenance Items Addressed:   Additional Screening:  Vision Screening: Recommended annual  ophthalmology exams for early detection of glaucoma and other disorders of the eye.  Pt stated he has an annual eye exam w/[facility] and was last seen in 08/2023.  Dental Screening: Recommended annual dental exams for proper oral hygiene  Community Resource Referral / Chronic Care Management: CRR required this visit?  No   CCM required this visit?  No     Plan:     I have personally reviewed and noted the following in the patient's chart:   Medical and social history Use of alcohol, tobacco or illicit drugs  Current medications and supplements including opioid prescriptions. Patient is not currently taking opioid prescriptions. Functional ability and status Nutritional status Physical activity Advanced directives List of other physicians Hospitalizations, surgeries, and ER visits in previous 12 months Vitals Screenings to include cognitive, depression, and falls Referrals and appointments  In addition, I have reviewed and discussed with patient certain preventive protocols, quality metrics, and best practice recommendations. A written personalized care plan for preventive services as well as general preventive health recommendations were provided to patient.     Patria Bookbinder, CMA   12/31/2023   After Visit Summary: (MyChart) Due to this being a telephonic visit, the after visit summary with patients personalized plan was offered to patient via MyChart   Notes: Please refer to Routing Comments.

## 2023-12-31 NOTE — Patient Instructions (Addendum)
 Mr. Keith [name] , Thank you for taking time to come for your Medicare Wellness Visit. I appreciate your ongoing commitment to your health goals. Please review the following plan we discussed and let me know if I can assist you in the future.   Referrals/Orders/Follow-Ups/Clinician Recommendations: Aim for 30 minutes of exercise or brisk walking, 6-8 glasses of water, and 5 servings of fruits and vegetables each day.   This is a list of the screening recommended for you and due dates:  Health Maintenance  Topic Date Due   Zoster (Shingles) Vaccine (1 of 2) 03/31/2024*   Flu Shot  04/15/2024   Hemoglobin A1C  05/12/2024   Eye exam for diabetics  08/23/2024   Yearly kidney function blood test for diabetes  11/12/2024   Yearly kidney health urinalysis for diabetes  11/12/2024   Complete foot exam   11/15/2024   Medicare Annual Wellness Visit  12/30/2024   Colon Cancer Screening  08/01/2028   DTaP/Tdap/Td vaccine (4 - Td or Tdap) 05/18/2029   Pneumonia Vaccine  Completed   Hepatitis C Screening  Completed   HPV Vaccine  Aged Out   Meningitis B Vaccine  Aged Out   COVID-19 Vaccine  Discontinued  *Topic was postponed. The date shown is not the original due date.    Advanced directives: (Provided) Advance directive discussed with you today. I have provided a copy for you to complete at home and have notarized. Once this is complete, please bring a copy in to our office so we can scan it into your chart.   Next Medicare Annual Wellness Visit scheduled for next year: Yes

## 2024-01-21 ENCOUNTER — Other Ambulatory Visit: Payer: Self-pay

## 2024-01-21 ENCOUNTER — Other Ambulatory Visit (HOSPITAL_COMMUNITY): Payer: Self-pay

## 2024-01-21 DIAGNOSIS — N401 Enlarged prostate with lower urinary tract symptoms: Secondary | ICD-10-CM | POA: Diagnosis not present

## 2024-01-21 DIAGNOSIS — R3912 Poor urinary stream: Secondary | ICD-10-CM | POA: Diagnosis not present

## 2024-01-21 MED ORDER — TAMSULOSIN HCL 0.4 MG PO CAPS
0.4000 mg | ORAL_CAPSULE | Freq: Every day | ORAL | 3 refills | Status: DC
  Filled 2024-01-21: qty 90, 90d supply, fill #0

## 2024-02-24 MED FILL — Olmesartan Medoxomil Tab 40 MG: ORAL | 90 days supply | Qty: 90 | Fill #1 | Status: AC

## 2024-03-08 ENCOUNTER — Other Ambulatory Visit (HOSPITAL_COMMUNITY): Payer: Self-pay

## 2024-03-08 ENCOUNTER — Other Ambulatory Visit: Payer: Self-pay

## 2024-03-10 DIAGNOSIS — N401 Enlarged prostate with lower urinary tract symptoms: Secondary | ICD-10-CM | POA: Diagnosis not present

## 2024-03-10 DIAGNOSIS — R3912 Poor urinary stream: Secondary | ICD-10-CM | POA: Diagnosis not present

## 2024-03-14 ENCOUNTER — Other Ambulatory Visit (HOSPITAL_COMMUNITY): Payer: Self-pay

## 2024-03-14 ENCOUNTER — Other Ambulatory Visit: Payer: Self-pay

## 2024-03-14 MED ORDER — SILODOSIN 4 MG PO CAPS
4.0000 mg | ORAL_CAPSULE | Freq: Every day | ORAL | 0 refills | Status: DC
  Filled 2024-03-14: qty 60, 60d supply, fill #0

## 2024-04-01 ENCOUNTER — Other Ambulatory Visit (HOSPITAL_COMMUNITY): Payer: Self-pay

## 2024-04-22 ENCOUNTER — Other Ambulatory Visit: Payer: Self-pay

## 2024-04-22 ENCOUNTER — Encounter (HOSPITAL_COMMUNITY): Payer: Self-pay

## 2024-04-22 ENCOUNTER — Emergency Department (HOSPITAL_COMMUNITY)

## 2024-04-22 ENCOUNTER — Emergency Department (HOSPITAL_COMMUNITY): Admission: EM | Admit: 2024-04-22 | Discharge: 2024-04-22 | Disposition: A | Discharge status: Home / Self Care

## 2024-04-22 ENCOUNTER — Other Ambulatory Visit: Payer: Self-pay | Admitting: Student

## 2024-04-22 DIAGNOSIS — E039 Hypothyroidism, unspecified: Secondary | ICD-10-CM | POA: Insufficient documentation

## 2024-04-22 DIAGNOSIS — I5030 Unspecified diastolic (congestive) heart failure: Secondary | ICD-10-CM | POA: Diagnosis not present

## 2024-04-22 DIAGNOSIS — Z79890 Hormone replacement therapy (postmenopausal): Secondary | ICD-10-CM | POA: Insufficient documentation

## 2024-04-22 DIAGNOSIS — E119 Type 2 diabetes mellitus without complications: Secondary | ICD-10-CM | POA: Insufficient documentation

## 2024-04-22 DIAGNOSIS — R519 Headache, unspecified: Secondary | ICD-10-CM | POA: Insufficient documentation

## 2024-04-22 DIAGNOSIS — Z7984 Long term (current) use of oral hypoglycemic drugs: Secondary | ICD-10-CM | POA: Diagnosis not present

## 2024-04-22 DIAGNOSIS — I2089 Other forms of angina pectoris: Secondary | ICD-10-CM

## 2024-04-22 DIAGNOSIS — I5032 Chronic diastolic (congestive) heart failure: Secondary | ICD-10-CM | POA: Insufficient documentation

## 2024-04-22 DIAGNOSIS — I110 Hypertensive heart disease with heart failure: Secondary | ICD-10-CM | POA: Insufficient documentation

## 2024-04-22 DIAGNOSIS — I6789 Other cerebrovascular disease: Secondary | ICD-10-CM | POA: Insufficient documentation

## 2024-04-22 DIAGNOSIS — Z79899 Other long term (current) drug therapy: Secondary | ICD-10-CM | POA: Diagnosis not present

## 2024-04-22 DIAGNOSIS — R0789 Other chest pain: Secondary | ICD-10-CM | POA: Diagnosis not present

## 2024-04-22 DIAGNOSIS — R9389 Abnormal findings on diagnostic imaging of other specified body structures: Secondary | ICD-10-CM | POA: Diagnosis not present

## 2024-04-22 DIAGNOSIS — I2510 Atherosclerotic heart disease of native coronary artery without angina pectoris: Secondary | ICD-10-CM | POA: Insufficient documentation

## 2024-04-22 DIAGNOSIS — Z7902 Long term (current) use of antithrombotics/antiplatelets: Secondary | ICD-10-CM | POA: Insufficient documentation

## 2024-04-22 DIAGNOSIS — Z7982 Long term (current) use of aspirin: Secondary | ICD-10-CM | POA: Insufficient documentation

## 2024-04-22 DIAGNOSIS — Z87891 Personal history of nicotine dependence: Secondary | ICD-10-CM | POA: Diagnosis not present

## 2024-04-22 DIAGNOSIS — R079 Chest pain, unspecified: Secondary | ICD-10-CM

## 2024-04-22 DIAGNOSIS — R0989 Other specified symptoms and signs involving the circulatory and respiratory systems: Secondary | ICD-10-CM | POA: Diagnosis not present

## 2024-04-22 DIAGNOSIS — I10 Essential (primary) hypertension: Secondary | ICD-10-CM

## 2024-04-22 LAB — BASIC METABOLIC PANEL WITH GFR
Anion gap: 10 (ref 5–15)
BUN: 15 mg/dL (ref 8–23)
CO2: 21 mmol/L — ABNORMAL LOW (ref 22–32)
Calcium: 8.2 mg/dL — ABNORMAL LOW (ref 8.9–10.3)
Chloride: 107 mmol/L (ref 98–111)
Creatinine, Ser: 1.21 mg/dL (ref 0.61–1.24)
GFR, Estimated: >60 mL/min (ref >60)
Glucose, Bld: 159 mg/dL — ABNORMAL HIGH (ref 70–99)
Potassium: 3.7 mmol/L (ref 3.5–5.1)
Sodium: 138 mmol/L (ref 135–145)

## 2024-04-22 LAB — TROPONIN I (HIGH SENSITIVITY)
Troponin I (High Sensitivity): 6 ng/L (ref <18)
Troponin I (High Sensitivity): 6 ng/L (ref <18)

## 2024-04-22 LAB — CBC
HCT: 40.6 % (ref 39.0–52.0)
Hemoglobin: 13.8 g/dL (ref 13.0–17.0)
MCH: 30.3 pg (ref 26.0–34.0)
MCHC: 34 g/dL (ref 30.0–36.0)
MCV: 89.2 fL (ref 80.0–100.0)
Platelets: 252 K/uL (ref 150–400)
RBC: 4.55 MIL/uL (ref 4.22–5.81)
RDW: 13.3 % (ref 11.5–15.5)
WBC: 7.4 K/uL (ref 4.0–10.5)
nRBC: 0 % (ref 0.0–0.2)

## 2024-04-22 MED ORDER — ONDANSETRON HCL 4 MG/2ML IJ SOLN
4.0000 mg | Freq: Once | INTRAMUSCULAR | Status: AC
  Administered 2024-04-22: 4 mg via INTRAVENOUS
  Filled 2024-04-22: qty 2

## 2024-04-22 MED ORDER — MORPHINE SULFATE (PF) 4 MG/ML IV SOLN
4.0000 mg | Freq: Once | INTRAVENOUS | Status: AC
  Administered 2024-04-22: 4 mg via INTRAVENOUS
  Filled 2024-04-22: qty 1

## 2024-04-22 NOTE — Consult Note (Addendum)
 Cardiology Consultation   Patient ID: TYWAUN HILTNER MRN: [id number]; DOB: [birth year]  Admit date: 04/22/2024 Date of Consult: 04/22/2024  PCP:  Norleen Lynwood ORN, MD   [facility] Providers Cardiologist:  Redell Shallow, MD      Patient Profile: Keith [name] is a 68 y.o. male with a hx of nonischemic cardiomyopathy, hereditary hemochromatosis, HFimpEF, multifocal atrial tachycardia, wandering pacemaker, carotid artery stenosis, hypertension, hyperlipidemia, hypothyroidism, type 2 diabetes, and CAD s/p DES to the RCA in 08/2021 who is being seen 04/22/2024 for the evaluation of chest pain at the request of [facility].  History of Present Illness: Mr. Lantigua is a 68 year old male with prior cardiac history listed below  In 2010 the patient was seen for chest pain and had a positive stress test.  The cardiac cath showed luminal irregularities that were not felt to be responsible for the patient's chest pain.  An echocardiogram was done in 2011 and showed a reduced LVEF of 30%.  A cardiac monitor around that time showed multifocal atrial tachycardia.  It was felt like the cardiomyopathy was secondary to the patient's MAT or his hereditary hemochromatosis.  The patient gets periodic phlebotomies for his hemochromatosis. The atrial tachycardia was treated with amiodarone  but was stopped because of intolerance. Echo in 08/2014 showed a low normal LVEF of 50%, no RWMA, mild LVH, and G1 DD.  On 08/2021 the patient presented to the emergency department complaining of chest pain.  He underwent a cardiac cath that showed 99% stenosis in the mid to distal RCA.  She received a drug-eluting stent to the RCA.  The cath also showed 60% stenosis in the mid to proximal LAD, 70% stenosis in the first diagonal, and 65% stenosis to the acute marginal.  An echocardiogram was done at that time and showed a normal LVEF of 55 to 60%, no RWMA, G1 DD, normal RV systolic function, and grossly normal valve  function.  In 2023 the patient had a complete tolerating more than 5 mg of Crestor .  He was started on Repatha  but stopped because of the cost.  The patient required paperwork assistance for Repatha .  It is unclear if the patient was able to restart taking Repatha .  On 02/2022 the patient had lower extremity pain that was concerning for claudication.  ABIs were ordered and showed no evidence of PAD.  Patient's last visit was with Reche Finder NP on 04/2023.  He complained of dizziness and lightheadedness with thought to be most consistent with BPPV and was referred for vestibular therapy.  Patient presented to the [facility] emergency department on 04/22/2024 complaining of chest pain.  On interview patient report that he has had intermitten chest pain for the past 2 weeks.  Reported that the chest pain was worse last night.  States that he had difficulty sleeping last night because of the chest pain.  Denies that the chest pain is associated with exertion. In addition to the chest pain also reports a headache and dizziness.  Also have a cough for the past day and worsening sinus congestion. He describes the pain as a sharp pressure that radiates to his neck.  Denies any ongoing chest pain.  The chest discomfort is worse when laying down at night.  He denied any chest pain when I laid him flat in the bed.  Lives a sedentary lifestyle but this is primarily because of his back pain.  Is able to walk around larger stores by himself.  Denies any recent changes  to functional ability.  Denies any pleuritic chest pain, shortness of breath, dyspnea on exertion, orthopnea, nausea, vomiting, fever, chills, diaphoresis.  Stated that the chest discomfort does not feel similar to the prior angina that he had in 2022.  He has had a significant amount of GERD in the past and feels like this discomfort is more similar to that.   Labs showed elevated glucose of 159, potassium of 3.7, sodium of 138, BUN of 15, creatinine of  1.21, and hemoglobin of 13.8.  Prior A1c on 10/2023 was 6.8.  Chest x-ray showed no active cardiopulmonary disease.  EKG showed normal sinus rhythm with a heart rate of 81, and anterior T wave flattening not seen on prior EKG.  Past Medical History:  Diagnosis Date   Anxiety    Arthritis    Cervical spine pain    Chronic   Chronic HFimpEF (heart failure with improved ejection fraction) (HCC)    a. 01/2010 Echo: EF 30%, diff HK; b. 08/2014 Echo: EF 50%, no rwma, GrI DD.   Diabetes mellitus    Diverticulosis    Fatty liver    GERD (gastroesophageal reflux disease)    Hereditary hemochromatosis    Compound heterozygote. Pt gets periodic phlebotomies   History of colonic polyps    History of NICM (nonischemic cardiomyopathy) w/ subsequent improvement in LV function(HCC)    a. Echo (5/11) showed EF 30% with diffuse hypokinesis, normal wall thickness, mildly decreased RV systolic function. This may be a cardiomyopathy due to hemochromatosis versus tachy-mediated; b. 08/2014 Echo: EF 50%, no rwma, GrI DD.   Hyperlipidemia    Hypertension    Hypothyroidism    Low back pain    The pt is S/P spinal fusion surgeries   Multifocal atrial tachycardia (HCC)    a. Holter monior in 5/11 showed frequent runs of symtomatic MAT with heart rate around 100-->prev on amio-->now on bb only.   Nonobstructive CAD (coronary artery disease)    a. 11/2008 MV: EF 50%, Inf HK, inferior scar and ischemia in the apical anterior septum and in the inferior wall; b. 11/2008 Cath: LM nl, LAD min irregs, LCX nl, OM1/2 nl, RCA nl.   Peptic ulcer disease    Thyroid  nodule    Wandering atrial pacemaker     Past Surgical History:  Procedure Laterality Date   CORONARY STENT INTERVENTION [name] 09/06/2021   Procedure: CORONARY STENT INTERVENTION;  Surgeon: Claudene Victory ORN, MD;  Location: [facility]IVE [facility];  Service: Cardiovascular;  Laterality: [name];   INGUINAL HERNIA REPAIR Right    LEFT HEART CATH AND CORONARY ANGIOGRAPHY  [name] 09/06/2021   Procedure: LEFT HEART CATH AND CORONARY ANGIOGRAPHY;  Surgeon: Claudene Victory ORN, MD;  Location: [facility] CV [facility];  Service: Cardiovascular;  Laterality: [name];   LUMBAR DISC SURGERY  09/15/1989   TONSILLECTOMY       Home Medications:  Prior to Admission medications   Medication Sig Start Date End Date Taking? Authorizing Provider  Alcohol Swabs (B-D SINGLE USE SWABS REGULAR) PADS Use as directed once daily E11.9 07/11/21   Norleen Lynwood ORN, MD  amLODipine  (NORVASC ) 5 MG tablet Take 1 tablet (5 mg total) by mouth daily. 12/01/23   Vannie Reche RAMAN, NP  aspirin  81 MG EC tablet Take 81 mg by mouth daily.    [provider]  Blood Glucose Calibration (TRUE METRIX LEVEL 1) Low SOLN Use as directed once daily E11.9 07/11/21   Norleen Lynwood ORN, MD  Blood Glucose Monitoring Suppl (  TRUE METRIX AIR GLUCOSE METER) w/Device KIT USE AS DIRECTED 04/18/22   Norleen Lynwood ORN, MD  Cholecalciferol (VITAMIN D -3) 25 MCG (1000 UT) CAPS Take 2,000 Units by mouth daily.    [provider]  clopidogrel  (PLAVIX ) 75 MG tablet Take 1 tablet (75 mg total) by mouth daily. 12/01/23   Norleen Lynwood ORN, MD  DROPLET PEN NEEDLES 32G X 4 MM MISC  11/19/20   [provider]  Evolocumab  (REPATHA  SURECLICK) 140 MG/ML SOAJ Inject 140 mg into the skin every 14 (fourteen) days. 11/16/23   Norleen Lynwood ORN, MD  fenofibrate  (TRICOR ) 145 MG tablet Take 1 tablet (145 mg total) by mouth daily. 12/01/23   Norleen Lynwood ORN, MD  glucosamine-chondroitin 500-400 MG tablet Take 1 tablet by mouth daily.    [provider]  glucose blood (RELION TRUE METRIX TEST STRIPS) test strip Use as instructed once daily E11.9 07/11/21   Norleen Lynwood ORN, MD  levothyroxine  (SYNTHROID ) 25 MCG tablet TAKE 1 TABLET EVERY DAY (NEED MD APPOINTMENT) 09/03/23   Norleen Lynwood ORN, MD  metFORMIN  (GLUCOPHAGE -XR) 500 MG 24 hr tablet Take 4 tablets (2,000 mg total) by mouth daily with breakfast. 12/01/23   Norleen Lynwood ORN, MD  metoprolol  succinate  (TOPROL -XL) 100 MG 24 hr tablet Take 1 tablet (100 mg total) by mouth 2 (two) times daily with or immediately following a meal. 12/01/23   Norleen Lynwood ORN, MD  olmesartan  (BENICAR ) 40 MG tablet Take 1 tablet (40 mg total) by mouth daily. 09/03/23   Norleen Lynwood ORN, MD  Omega-3 Fatty Acids (FISH OIL PO) Take 1 tablet by mouth daily.     [provider]  pantoprazole  (PROTONIX ) 40 MG tablet Take 1 tablet (40 mg total) by mouth daily. 12/01/23   Norleen Lynwood ORN, MD  pioglitazone  (ACTOS ) 30 MG tablet Take 1 tablet (30 mg total) by mouth daily. 12/01/23   Norleen Lynwood ORN, MD  sildenafil  (VIAGRA ) 100 MG tablet Take 0.5-1 tablets (50-100 mg total) by mouth daily as needed for erectile dysfunction. 07/21/23   Norleen Lynwood ORN, MD  tadalafil  (CIALIS ) 20 MG tablet Take 1 tablet (20 mg total) by mouth daily as needed for erectile dysfunction. 04/17/21 03/10/23  Norleen Lynwood ORN, MD  tirzepatide  (MOUNJARO ) 2.5 MG/0.5ML Pen Inject 2.5 mg into the skin once a week. Patient not taking: Reported on 12/31/2023 09/21/23   Norleen Lynwood ORN, MD  TRUEplus Lancets 33G MISC Use as directed once daily E11.9 07/11/21   Norleen Lynwood ORN, MD    Scheduled Meds:  Continuous Infusions:  PRN Meds:   Allergies:    Allergies  Allergen Reactions   Dicyclomine Nausea And Vomiting and Other (See Comments)    Lightheaded, dizziness, SOB   Doxycycline  Nausea Only    Weak, fatigue, sick on stomach   Lipitor [Atorvastatin ]     myalgias   Penicillins Other (See Comments)    Childhood allergy - unknown reaction   Reglan [Metoclopramide] Other (See Comments)    Unknown reaction   Zocor [Simvastatin] Other (See Comments)    Myalgia    Zoloft [Sertraline Hcl] Other (See Comments)    Unknown reaction     Social History:   Social History   Socioeconomic History   Marital status: Married    Spouse name: Not on file   Number of children: 3   Years of education: Not on file   Highest education level: Not on file  Occupational History    Occupation: Disabled  Employer: UNEMPLOYED  Tobacco Use   Smoking status: Former    Current packs/day: 0.00    Types: Cigarettes    Quit date: 09/16/2003    Years since quitting: 20.6    Passive exposure: Past   Smokeless tobacco: Never  Vaping Use   Vaping status: Never Used  Substance and Sexual Activity   Alcohol use: Not Currently    Comment: social - 1 drink every few months.   Drug use: Not Currently    Comment: Quit in 1980   Sexual activity: Yes  Other Topics Concern   Not on file  Social History Narrative   Lives in [place] by himself.  Does not routinely exercise.   Social Drivers of Corporate investment banker Strain: Low Risk  (12/31/2023)   Overall Financial Resource Strain (CARDIA)    Difficulty of Paying Living Expenses: Not hard at all  Food Insecurity: No Food Insecurity (12/31/2023)   Hunger Vital Sign    Worried About Running Out of Food in the Last Year: Never true    Ran Out of Food in the Last Year: Never true  Transportation Needs: No Transportation Needs (12/31/2023)   PRAPARE - Administrator, Civil Service (Medical): No    Lack of Transportation (Non-Medical): No  Physical Activity: Inactive (12/31/2023)   Exercise Vital Sign    Days of Exercise per Week: 0 days    Minutes of Exercise per Session: 0 min  Stress: No Stress Concern Present (12/31/2023)   [employer] of Occupational Health - Occupational Stress Questionnaire    Feeling of Stress : Not at all  Social Connections: Moderately Isolated (12/31/2023)   Social Connection and Isolation Panel    Frequency of Communication with Friends and Family: More than three times a week    Frequency of Social Gatherings with Friends and Family: More than three times a week    Attends Religious Services: Never    Database administrator or Organizations: No    Attends Banker Meetings: Never    Marital Status: Married  Catering manager Violence: Not At Risk (12/31/2023)    Humiliation, Afraid, Rape, and Kick questionnaire    Fear of Current or Ex-Partner: No    Emotionally Abused: No    Physically Abused: No    Sexually Abused: No    Family History:    Family History  Problem Relation Age of Onset   Colon polyps Mother    Multiple sclerosis Mother    Diabetes Mother    Colitis Mother    Hypertension Father    Alcohol abuse Father    Multiple sclerosis Sister    Diabetes Paternal Grandmother    Diabetes Paternal Grandfather    Colon cancer Neg Hx      ROS:  Please see the history of present illness.   All other ROS reviewed and negative.     Physical Exam/Data: Vitals:   04/22/24 0924 04/22/24 0927 04/22/24 1015 04/22/24 1212  BP: (!) 168/91  (!) 140/76 (!) 153/90  Pulse: 84 84 71 64  Resp: 17 16 17  (!) 23  Temp:  98.4 F (36.9 C)    TempSrc:  Oral    SpO2: 99% 99% 99% 98%  Weight:      Height:       No intake or output data in the 24 hours ending 04/22/24 1215    04/22/2024    9:23 AM 12/31/2023    8:55 AM 11/16/2023  9:47 AM  Last 3 Weights  Weight (lbs) 240 lb 250 lb 250 lb  Weight (kg) 108.863 kg 113.399 kg 113.399 kg     Body mass index is 34.44 kg/m.  General:  Well nourished, well developed, overweight male who is alert and orientated and in no acute distress on room air. HEENT: normal Neck: no JVD Vascular: No carotid bruits; Distal pulses 2+ bilaterally Cardiac:  normal S1, S2; RRR; no murmur  Lungs:  clear to auscultation bilaterally, no wheezing, rhonchi or rales  Abd: soft protruding, nontender, no hepatomegaly  Ext: no edema Musculoskeletal:  No deformities Skin: warm and dry  Neuro:  no focal abnormalities noted Psych:  Normal affect   EKG:  The EKG was personally reviewed and demonstrates:  Showed normal sinus rhythm with a heart rate of 81, and anterior T wave flattening not seen on prior EKG. Telemetry:  Telemetry was personally reviewed and demonstrates:  Normal sinus rhythm with a significant amount of  PAC's  Relevant CV Studies:   Laboratory Data: High Sensitivity Troponin:   Recent Labs  Lab 04/22/24 0942  TROPONINIHS 6     ChemistryNo results for input(s): NA, K, CL, CO2, GLUCOSE, BUN, CREATININE, CALCIUM , MG, GFRNONAA, GFRAA, ANIONGAP in the last 168 hours.  No results for input(s): PROT, ALBUMIN, AST, ALT, ALKPHOS, BILITOT in the last 168 hours. Lipids No results for input(s): CHOL, TRIG, HDL, LABVLDL, LDLCALC, CHOLHDL in the last 168 hours.  Hematology Recent Labs  Lab 04/22/24 0942  WBC 7.4  RBC 4.55  HGB 13.8  HCT 40.6  MCV 89.2  MCH 30.3  MCHC 34.0  RDW 13.3  PLT 252   Thyroid  No results for input(s): TSH, FREET4 in the last 168 hours.  BNPNo results for input(s): BNP, PROBNP in the last 168 hours.  DDimer No results for input(s): DDIMER in the last 168 hours.  Radiology/Studies:  CT Head Wo Contrast Result Date: 04/22/2024 EXAM: CT HEAD WITHOUT CONTRAST 04/22/2024 10:26:17 AM TECHNIQUE: CT of the head was performed without the administration of intravenous contrast. Automated exposure control, iterative reconstruction, and/or weight based adjustment of the mA/kV was utilized to reduce the radiation dose to as low as reasonably achievable. COMPARISON: MRI brain 05/25/2017. CLINICAL HISTORY: Headache, increasing frequency or severity. FINDINGS: BRAIN AND VENTRICLES: No acute hemorrhage. Gray-white differentiation is preserved. No hydrocephalus. No extra-axial collection. No mass effect or midline shift. Unchanged background of mild chronic small vessel disease. ORBITS: No acute abnormality. SINUSES: No acute abnormality. SOFT TISSUES AND SKULL: No acute soft tissue abnormality. No skull fracture. IMPRESSION: 1. No acute intracranial abnormality. 2. Unchanged background of mild chronic small vessel disease. Electronically signed by: Ryan Chess MD 04/22/2024 10:36 AM EDT RP Workstation: [id number]   DG Chest  Port 1 View Result Date: 04/22/2024 CLINICAL DATA:  Chest pain. EXAM: PORTABLE CHEST 1 VIEW COMPARISON:  09/06/2021. FINDINGS: Low lung volume. Bilateral lung fields are clear. Bilateral costophrenic angles are clear. Note is made of mildly elevated right hemidiaphragm. Normal cardio-mediastinal silhouette. No acute osseous abnormalities. The soft tissues are within normal limits. IMPRESSION: No active disease. Electronically Signed   By: Ree Molt M.D.   On: 04/22/2024 10:14     Assessment and Plan:  DOW BLAHNIK is a 68 y.o. male with a hx of nonischemic cardiomyopathy, hereditary hemochromatosis, HFimpEF, multifocal atrial tachycardia, wandering pacemaker, carotid artery stenosis, hypertension, hyperlipidemia, hypothyroidism, type 2 diabetes, and CAD s/p DES to the RCA in 08/2021 who is being seen 04/22/2024 for the  evaluation of chest pain at the request of [facility].  Chest pain CAD s/p DES to the RCA Hyperlipidemia Cardiac cath on 08/2021 showed 99% stenosis in the mid to distal RCA.  He received a drug-eluting stent to the RCA.  The cath also showed 60% stenosis in the mid to proximal LAD, 70% stenosis in the first diagonal, and 65% stenosis to the acute marginal. Has had intermittent chest pain over the past 2 weeks that feels like a sharp pressure.  Had difficulty sleeping last night because of the discomfort.  Denies that chest pain is associated with exertion.  Denies ongoing chest pain.  Feels like the chest discomfort is more similar to his GERD than the prior angina he had in 2022.  High-sensitivity troponins negative  6> 6. EKG showed normal sinus rhythm with a heart rate of 81, and non specific anterior T wave inversions. Overall chest discomfort sounds atypical for ACS and sounds more like GERD.  Patient is high risk for ACS with known CAD and multiple risk factors including sedentary lifestyle, diabetes, and hypertension. Continue aspirin  81 mg daily. Continue fenofibrate   145 mg daily. Continue Repatha  injections every 2 weeks. Will plan on getting an outpatient nuclear stress test. recommend optimizing GERD management.   HFimpEF Had a reduced LVEF of 30% in 2011 that was felt to be secondary to hereditary hemochromatosis or MAT.  Echo in 2015 showed LVEF of 50%. Echocardiogram on 08/2021 showed a normal LVEF of 55 to 60%, no RWMA, G1 DD, normal RV systolic function, and grossly normal valve function.  Denies any shortness of breath, orthopnea, and lower extremity edema.  Appears euvolemic on exam   Hypertension PTA was on amlodipine  5 mg daily, metoprolol  100 mg twice daily, olmesartan  40 mg daily. Patient's blood pressures in the emergency department have been slightly elevated.  His most recent BP was 143/113.  Recommend checking blood pressures at home.   Type 2 diabetes Most recent hemoglobin A1c 6.8.  Increases patient's cardiac risk.  Management per primary.   Otherwise manage per primary    Risk Assessment/Risk Scores:         [facility] will sign off.   The patient is ready for discharge today from a cardiac standpoint. Medication Recommendations: No med changes Other recommendations (labs, testing, etc): Nuclear stress test Follow up as an outpatient:follow-up with Vannie Reche RAMAN, NP on 05/10/24.  For questions or updates, please contact [facility] Please consult [email or web address] for contact info under    Signed, Morse Clause, PA-C  04/22/2024 12:15 PM  Patient seen, examined. Available data reviewed. Agree with findings, assessment, and plan as outlined by Morse Clause, PA-C.  Patient is independently interviewed and examined.  He is alert, oriented, in no distress.  HEENT is normal, JVP is normal, carotid upstrokes normal without bruits, lungs are clear to auscultation bilaterally, heart is regular rate and rhythm with no murmur or gallop, abdomen is soft and nontender with no masses, extremities have no edema, skin  is warm and dry with no rash.  EKG shows normal sinus rhythm with nonspecific T wave abnormality, no acute changes.  High-sensitivity troponins are negative x 2.  The patient's chest pain history is reported as 2 weeks of near constant discomfort, sometimes worsened by lying supine.  There is no exertional component and in fact he feels better when he is up and moving around.  His symptoms are distinctly different from what he experienced when he had unstable angina a  few years back when he described exertional chest burning and pressure at that time.  I reviewed potential diagnostic options with him including observation of symptoms and clinical follow-up, noninvasive cardiac testing, and invasive evaluation with cardiac catheterization.  I think with his atypical symptoms and lack of objective findings, cardiac catheterization is not warranted.  I have recommended a Lexiscan Myoview stress test (unable to exercise on the treadmill due to back problems).  This will be arranged in the outpatient setting.  ER return precautions are given.  The patient will follow-up with Reche Finder (who he has seen in the past) at our [place] office for Dr. Pietro.  Informed Consent   Shared Decision Making/Informed Consent The risks [chest pain, shortness of breath, cardiac arrhythmias, dizziness, blood pressure fluctuations, myocardial infarction, stroke/transient ischemic attack, nausea, vomiting, allergic reaction, radiation exposure, metallic taste sensation and life-threatening complications (estimated to be 1 in 10,000)], benefits (risk stratification, diagnosing coronary artery disease, treatment guidance) and alternatives of a nuclear stress test were discussed in detail with Mr. Spikes and he agrees to proceed.      Ozell Fell, M.D. 04/22/2024 4:24 PM

## 2024-04-22 NOTE — ED Notes (Addendum)
 Cardiologist at bedside.

## 2024-04-22 NOTE — Discharge Instructions (Addendum)
 You should receive a call in next few days regarding follow-up for your stress test.  Please follow-up for this and return to the emergency department for worsening symptoms.

## 2024-04-22 NOTE — Progress Notes (Signed)
 Myoview with lexiscan to be done in 1-2 weeks.   Leeman Johnsey PA-C

## 2024-04-22 NOTE — ED Provider Notes (Signed)
 [place] EMERGENCY DEPARTMENT AT [facility] Provider Note   CSN: [id number] Arrival date & time: 04/22/24  9081     Patient presents with: Chest Pain   Keith [name] is a 68 y.o. male.   68 year old male with past medical history of coronary artery disease, diabetes, and hyperlipidemia presenting to the emergency department today with chest pressure.  The patient states that this is been going on now for the past 2 weeks or so.  Reports that it is getting worse as time goes by.  Reports this is a pressure sensation.  Reports that it was worse last night when he lied down to go to bed.  The patient states he was also having some pressure in his head at that time.  Reports that this has been going on now over the past few days as well.  He denies any pleuritic pain.  Denies any hemoptysis.  He states that he has had a mild cough over the past few days but denies any fevers.  He came to the emergency department today for further evaluation due to these ongoing symptoms.  The patient denies any leg swelling.   Chest Pain      Prior to Admission medications   Medication Sig Start Date End Date Taking? Authorizing Provider  Alcohol Swabs (B-D SINGLE USE SWABS REGULAR) PADS Use as directed once daily E11.9 07/11/21   Norleen Lynwood ORN, MD  amLODipine  (NORVASC ) 5 MG tablet Take 1 tablet (5 mg total) by mouth daily. 12/01/23   Vannie Reche RAMAN, NP  aspirin  81 MG EC tablet Take 81 mg by mouth daily.    [provider]  Blood Glucose Calibration (TRUE METRIX LEVEL 1) Low SOLN Use as directed once daily E11.9 07/11/21   Norleen Lynwood ORN, MD  Blood Glucose Monitoring Suppl (TRUE METRIX AIR GLUCOSE METER) w/Device KIT USE AS DIRECTED 04/18/22   Norleen Lynwood ORN, MD  Cholecalciferol (VITAMIN D -3) 25 MCG (1000 UT) CAPS Take 2,000 Units by mouth daily.    [provider]  clopidogrel  (PLAVIX ) 75 MG tablet Take 1 tablet (75 mg total) by mouth daily. 12/01/23   Norleen Lynwood ORN, MD   DROPLET PEN NEEDLES 32G X 4 MM MISC  11/19/20   [provider]  Evolocumab  (REPATHA  SURECLICK) 140 MG/ML SOAJ Inject 140 mg into the skin every 14 (fourteen) days. 11/16/23   Norleen Lynwood ORN, MD  fenofibrate  (TRICOR ) 145 MG tablet Take 1 tablet (145 mg total) by mouth daily. 12/01/23   Norleen Lynwood ORN, MD  glucosamine-chondroitin 500-400 MG tablet Take 1 tablet by mouth daily.    [provider]  glucose blood (RELION TRUE METRIX TEST STRIPS) test strip Use as instructed once daily E11.9 07/11/21   Norleen Lynwood ORN, MD  levothyroxine  (SYNTHROID ) 25 MCG tablet TAKE 1 TABLET EVERY DAY (NEED MD APPOINTMENT) 09/03/23   Norleen Lynwood ORN, MD  metFORMIN  (GLUCOPHAGE -XR) 500 MG 24 hr tablet Take 4 tablets (2,000 mg total) by mouth daily with breakfast. 12/01/23   Norleen Lynwood ORN, MD  metoprolol  succinate (TOPROL -XL) 100 MG 24 hr tablet Take 1 tablet (100 mg total) by mouth 2 (two) times daily with or immediately following a meal. 12/01/23   Norleen Lynwood ORN, MD  olmesartan  (BENICAR ) 40 MG tablet Take 1 tablet (40 mg total) by mouth daily. 09/03/23   Norleen Lynwood ORN, MD  Omega-3 Fatty Acids (FISH OIL PO) Take 1 tablet by mouth daily.     [provider]  pantoprazole  (PROTONIX ) 40 MG tablet Take 1 tablet (40 mg total) by mouth daily. 12/01/23   Norleen Lynwood ORN, MD  pioglitazone  (ACTOS ) 30 MG tablet Take 1 tablet (30 mg total) by mouth daily. 12/01/23   Norleen Lynwood ORN, MD  sildenafil  (VIAGRA ) 100 MG tablet Take 0.5-1 tablets (50-100 mg total) by mouth daily as needed for erectile dysfunction. 07/21/23   Norleen Lynwood ORN, MD  tadalafil  (CIALIS ) 20 MG tablet Take 1 tablet (20 mg total) by mouth daily as needed for erectile dysfunction. 04/17/21 03/10/23  Norleen Lynwood ORN, MD  tirzepatide  (MOUNJARO ) 2.5 MG/0.5ML Pen Inject 2.5 mg into the skin once a week. Patient not taking: Reported on 12/31/2023 09/21/23   Norleen Lynwood ORN, MD  TRUEplus Lancets 33G MISC Use as directed once daily E11.9 07/11/21   John, James W, MD     Allergies: Dicyclomine, Doxycycline , Lipitor [atorvastatin ], Penicillins, Reglan [metoclopramide], Zocor [simvastatin], and Zoloft [sertraline hcl]    Review of Systems  Cardiovascular:  Positive for chest pain.  All other systems reviewed and are negative.   Updated Vital Signs BP (!) 143/113   Pulse 81   Temp 98.6 F (37 C)   Resp 16   Ht 5' 10 (1.778 m)   Wt 108.9 kg   SpO2 98%   BMI 34.44 kg/m   Physical Exam Vitals and nursing note reviewed.   Gen: NAD Eyes: PERRL, EOMI HEENT: no oropharyngeal swelling Neck: trachea midline Resp: clear to auscultation bilaterally Card: RRR, no murmurs, rubs, or gallops Abd: nontender, nondistended Extremities: no calf tenderness, no edema Vascular: 2+ radial pulses bilaterally, 2+ DP pulses bilaterally Neuro: No focal deficits Skin: no rashes Psyc: acting appropriately   (all labs ordered are listed, but only abnormal results are displayed) Labs Reviewed  BASIC METABOLIC PANEL WITH GFR - Abnormal; Notable for the following components:      Result Value   CO2 21 (*)    Glucose, Bld 159 (*)    Calcium  8.2 (*)    All other components within normal limits  CBC  TROPONIN I (HIGH SENSITIVITY)  TROPONIN I (HIGH SENSITIVITY)    EKG: EKG Interpretation Date/Time:  Friday April 22 2024 09:25:32 EDT Ventricular Rate:  81 PR Interval:  181 QRS Duration:  107 QT Interval:  364 QTC Calculation: 423 R Axis:   -13  Text Interpretation: Sinus rhythm Ventricular premature complex Abnormal R-wave progression, early transition Borderline T abnormalities, anterior leads Confirmed by Ula Barter [phone]) on 04/22/2024 9:28:30 AM  Radiology: CT Head Wo Contrast Result Date: 04/22/2024 EXAM: CT HEAD WITHOUT CONTRAST 04/22/2024 10:26:17 AM TECHNIQUE: CT of the head was performed without the administration of intravenous contrast. Automated exposure control, iterative reconstruction, and/or weight based adjustment of the mA/kV was  utilized to reduce the radiation dose to as low as reasonably achievable. COMPARISON: MRI brain 05/25/2017. CLINICAL HISTORY: Headache, increasing frequency or severity. FINDINGS: BRAIN AND VENTRICLES: No acute hemorrhage. Gray-white differentiation is preserved. No hydrocephalus. No extra-axial collection. No mass effect or midline shift. Unchanged background of mild chronic small vessel disease. ORBITS: No acute abnormality. SINUSES: No acute abnormality. SOFT TISSUES AND SKULL: No acute soft tissue abnormality. No skull fracture. IMPRESSION: 1. No acute intracranial abnormality. 2. Unchanged background of mild chronic small vessel disease. Electronically signed by: Ryan Chess MD 04/22/2024 10:36 AM EDT RP Workstation: [id number]   DG Chest Port 1 View Result Date: 04/22/2024 CLINICAL DATA:  Chest pain. EXAM: PORTABLE CHEST 1 VIEW COMPARISON:  09/06/2021. FINDINGS: Low lung volume.  Bilateral lung fields are clear. Bilateral costophrenic angles are clear. Note is made of mildly elevated right hemidiaphragm. Normal cardio-mediastinal silhouette. No acute osseous abnormalities. The soft tissues are within normal limits. IMPRESSION: No active disease. Electronically Signed   By: Ree Molt M.D.   On: 04/22/2024 10:14     Procedures   Medications Ordered in the ED  morphine  (PF) 4 MG/ML injection 4 mg (4 mg Intravenous Given 04/22/24 0949)  ondansetron  (ZOFRAN ) injection 4 mg (4 mg Intravenous Given 04/22/24 0949)                                    Medical Decision Making 68 year old male with past medical history of coronary artery disease, diabetes, and hyperlipidemia presenting to the emergency department today with chest pressure that has gotten worse over the past 2 weeks.  I will further evaluate the patient here with basic lab as well as an EKG, chest x-ray, and troponin for further evaluation for ACS, pulmonary edema, pulmonary infiltrates, pneumothorax.  I will keep the patient on the  cardiac monitor to evaluate for arrhythmias.  Based on description of his symptoms and duration suspicion for aortic dissection or pulmonary embolism is low at this time.  Will give the patient morphine  Zofran  for his symptoms.  With the head pressure he is experiencing in his age we will obtain a CT scan to eval for intracranial hemorrhage or mass lesion.  Will hold off on aspirin  until after this returns.  I will reevaluate for ultimate disposition.  Given his history will discuss his case with cardiology after his initial workup is completed.  I spoke with Dr. Wonda from cardiology who evaluated the patient.  Recommended outpatient stress testing.  Patient is in agreement with this plan.  2 troponins are negative.  He is discharged with return precautions.  Amount and/or Complexity of Data Reviewed Labs: ordered. Radiology: ordered.  Risk Prescription drug management.        Final diagnoses:  Chest pain, unspecified type    ED Discharge Orders     None          Ula Prentice SAUNDERS, MD 04/22/24 [phone]

## 2024-04-22 NOTE — ED Triage Notes (Signed)
 Pt c/o CP that is spread across his chest with pressure. Pt says this has been going on for awhile. Pt endorses SOB.  Hx HTN  Pt states the cp is worse while lying down last night.

## 2024-04-28 ENCOUNTER — Encounter (HOSPITAL_COMMUNITY): Payer: Self-pay | Admitting: *Deleted

## 2024-05-09 ENCOUNTER — Telehealth (HOSPITAL_BASED_OUTPATIENT_CLINIC_OR_DEPARTMENT_OTHER): Payer: Self-pay | Admitting: *Deleted

## 2024-05-09 ENCOUNTER — Encounter (HOSPITAL_BASED_OUTPATIENT_CLINIC_OR_DEPARTMENT_OTHER): Payer: Self-pay

## 2024-05-09 NOTE — Telephone Encounter (Signed)
 S/w pt to move appt up per Caitlin Walker,NP due to stress test being preformed two days after ov appt. Pt would like to keep appointment to talk to Caitllin due to doctor at the hospital stated pt does not need a stress test but it is scheduled.  Pt is hoping Caitlin will cancel. Pt will keep app for tomorrow.

## 2024-05-10 ENCOUNTER — Encounter (HOSPITAL_BASED_OUTPATIENT_CLINIC_OR_DEPARTMENT_OTHER): Payer: Self-pay | Admitting: Family

## 2024-05-10 ENCOUNTER — Telehealth: Payer: Self-pay

## 2024-05-10 ENCOUNTER — Other Ambulatory Visit (INDEPENDENT_AMBULATORY_CARE_PROVIDER_SITE_OTHER)

## 2024-05-10 ENCOUNTER — Ambulatory Visit (HOSPITAL_BASED_OUTPATIENT_CLINIC_OR_DEPARTMENT_OTHER): Admitting: Family

## 2024-05-10 VITALS — BP 132/78 | HR 67 | Ht 70.0 in | Wt 250.5 lb

## 2024-05-10 DIAGNOSIS — I25118 Atherosclerotic heart disease of native coronary artery with other forms of angina pectoris: Secondary | ICD-10-CM

## 2024-05-10 DIAGNOSIS — Z794 Long term (current) use of insulin: Secondary | ICD-10-CM

## 2024-05-10 DIAGNOSIS — E119 Type 2 diabetes mellitus without complications: Secondary | ICD-10-CM

## 2024-05-10 DIAGNOSIS — K219 Gastro-esophageal reflux disease without esophagitis: Secondary | ICD-10-CM | POA: Diagnosis not present

## 2024-05-10 DIAGNOSIS — E785 Hyperlipidemia, unspecified: Secondary | ICD-10-CM | POA: Diagnosis not present

## 2024-05-10 DIAGNOSIS — E538 Deficiency of other specified B group vitamins: Secondary | ICD-10-CM

## 2024-05-10 DIAGNOSIS — I10 Essential (primary) hypertension: Secondary | ICD-10-CM | POA: Diagnosis not present

## 2024-05-10 DIAGNOSIS — E559 Vitamin D deficiency, unspecified: Secondary | ICD-10-CM | POA: Diagnosis not present

## 2024-05-10 LAB — BASIC METABOLIC PANEL WITH GFR
BUN: 16 mg/dL (ref 6–23)
CO2: 26 meq/L (ref 19–32)
Calcium: 9.1 mg/dL (ref 8.4–10.5)
Chloride: 102 meq/L (ref 96–112)
Creatinine, Ser: 1.35 mg/dL (ref 0.40–1.50)
GFR: 54.48 mL/min — ABNORMAL LOW (ref >60.00)
Glucose, Bld: 163 mg/dL — ABNORMAL HIGH (ref 70–99)
Potassium: 4 meq/L (ref 3.5–5.1)
Sodium: 140 meq/L (ref 135–145)

## 2024-05-10 LAB — VITAMIN D 25 HYDROXY (VIT D DEFICIENCY, FRACTURES): VITD: 52.81 ng/mL (ref 30.00–100.00)

## 2024-05-10 LAB — VITAMIN B12: Vitamin B-12: 575 pg/mL (ref 211–911)

## 2024-05-10 LAB — LIPID PANEL
Cholesterol: 90 mg/dL (ref 0–200)
HDL: 35.9 mg/dL — ABNORMAL LOW (ref >39.00)
LDL Cholesterol: 26 mg/dL (ref 0–99)
NonHDL: 54.04
Total CHOL/HDL Ratio: 3
Triglycerides: 141 mg/dL (ref 0.0–149.0)
VLDL: 28.2 mg/dL (ref 0.0–40.0)

## 2024-05-10 LAB — HEMOGLOBIN A1C: Hgb A1c MFr Bld: 8.6 % — ABNORMAL HIGH (ref 4.6–6.5)

## 2024-05-10 LAB — HEPATIC FUNCTION PANEL
ALT: 15 U/L (ref 0–53)
AST: 14 U/L (ref 0–37)
Albumin: 4.3 g/dL (ref 3.5–5.2)
Alkaline Phosphatase: 37 U/L — ABNORMAL LOW (ref 39–117)
Bilirubin, Direct: 0.2 mg/dL (ref 0.0–0.3)
Total Bilirubin: 0.6 mg/dL (ref 0.2–1.2)
Total Protein: 7.3 g/dL (ref 6.0–8.3)

## 2024-05-10 NOTE — Progress Notes (Signed)
 Cardiology Office Note:  .   Date:  05/10/2024  ID:  [name], DOB Sep 17, 1955, MRN [id number] PCP: Norleen Lynwood ORN, MD  [facility] Providers Cardiologist:  Redell Shallow, MD Cardiology APP:  Vannie Reche RAMAN, NP    History of Present Illness: .   Keith [name] is a 68 y.o. male  with a hx of nonischemic cardiomyopathy, chronic systolic heart failure, CAD, multifocal atrial tachycardia, wandering pacemaker, carotid stenosis, GERD, hypertension, hyperlipidemia, DM2, hypothyroidism, mild bilateral carotid stenosis.    Cardiac catheterization 2010 due to positive stress test revealing only minor irregularities in the LAD and otherwise normal coronaries.  Echo May 2011 EF 30%.  Treated as nonischemic cardiomyopathy potentially felt to be secondary to multifocal atrial tachycardia and or hereditary hemochromatosis.  Atrial tachycardia noted on Holter monitor May 2011 managed with amiodarone  which was later discontinued due to intolerance. Echo December 2015 LVEF improved to 50% with no RWMA, grade 1 diastolic dysfunction.     Lost to follow-up from 2016 until presenting to [facility] emergency department 09/06/21 with angina, LHC with plaque rupture in mid to distal RCA producing Medina 100 bifurcation stenosis with large acute marginal branch.  Underwent stenting with reduction in 99% stenosis to less than 10% withTIMI III flow.  There was transient total occlusion of the acute marginal branch that returned after intracoronary nitroglycerin  and rewiring the vessel with guidewire.  Known residual 50 to 60% mid LAD with 60 to 70% diagonal stenosis.  Echo during admission revealed LVEF 55 to 60%, grade 1 diastolic dysfunction, no significant valvular abnormalities, borderline dilation ascending aorta 35 mm.  Discharged on aspirin , Plavix , Crestor , fish oil, beta-blocker, Benicar , amlodipine , Tricor .     Evaluated by pharmacy team 10/2021.  Tolerated rosuvastatin  5 mg but not higher  doses.  LDL goal less than 55.  He was started on Repatha , but this has been cost prohibitive. Seen 02/13/22 noting lower extremity pain and ABIs no evidence of PAD.  Amlodipine  reduced to 5 mg daily due to relative hypotension.  Seen 08/2022 noting poor energy level, declined sleep study.    Carotid duplex 02/2023 bilateral 1 to 39% stenosis. At visit 04/21/23, Zetia  10mg  daily initiated.   ED visit 04/22/24 with intermittent chest pain x 2 weeks which was not associated with exertion. Also noted headache, dizziness, sinus congestion, cough. Chest discomofrt predominantly when laying in bed. Felt different than prior angina, more similar to GERD. EKG with new anterior T wave flattening. HS troponin 6 ? 6. Chest discomfort overall atypical, but given high risk for ACS recommended for outpatient nuclear stress test.    Presents today for follow up. Has had a couple brief episodes of chest pain since ED always after eating. Often resolved by RollAids. Taking Pantoprazole  40mg  daily.  Tries to avoid spicy foods as he knows these trigger his indigestion.  Denies exertional dyspnea, exertional chest pain.  His present occasional chest pain is more consistent with his prior GERD and different than his prior angina.  Exercise limited by back pain.  He notes he is not taking Mounjaro  as did not tolerate and plan to discuss with primary care next week at visit.  ROS: Please see the history of present illness.    All other systems reviewed and are negative.   Studies Reviewed: .         Cardiac Studies & Procedures   ______________________________________________________________________________________________ CARDIAC CATHETERIZATION  CARDIAC CATHETERIZATION 09/06/2021  Conclusion   Mid RCA lesion is 99%  stenosed.   Prox LAD to Mid LAD lesion is 60% stenosed.   1st Diag lesion is 70% stenosed.   Acute Mrg lesion is 65% stenosed.   A stent was successfully placed.   Post intervention, there is a 0% residual  stenosis.   The left ventricular systolic function is normal.   LV end diastolic pressure is normal.   The left ventricular ejection fraction is 55-65% by visual estimate.  CONCLUSIONS: Unstable angina related to plaque rupture in the mid to distal RCA producing a Medina 100 bifurcation stenosis with a large acute marginal branch. Successful provisional stenting of the RCA and reduction in 99% stenosis to less than 10% with TIMI grade III flow.  There was transient total occlusion of the acute marginal branch that returned after intracoronary nitroglycerin  and rewiring the vessel with a guidewire. Widely patent left main 50 to 60% mid LAD with 60 to 70% diagonal with Medina 111 pattern. Circumflex is widely patent. LV is hyperdynamic with EF 60%.  LVEDP is less than 10 mmHg.  RECOMMENDATIONS:  Aspirin  and Plavix  for 12 months.  Anticipate discharge in a.m. unless complications. Aggressive risk factor modification. Phase 1 cardiac rehab.  Findings Coronary Findings Diagnostic  Dominance: Right  Left Anterior Descending Prox LAD to Mid LAD lesion is 60% stenosed.  First Diagonal Branch 1st Diag lesion is 70% stenosed.  Right Coronary Artery Mid RCA lesion is 99% stenosed.  Acute Marginal Branch Acute Mrg lesion is 65% stenosed.  Intervention  Mid RCA lesion Stent A stent was successfully placed. Post-Intervention Lesion Assessment The intervention was successful. Pre-interventional TIMI flow is 3. Post-intervention TIMI flow is 3. No-reflow occurred during the intervention. There is a 0% residual stenosis post intervention.     ECHOCARDIOGRAM  ECHOCARDIOGRAM COMPLETE 09/06/2021  Narrative ECHOCARDIOGRAM REPORT    Patient Name:   Keith [name] Date of Exam: 09/06/2021 Medical Rec #:  [id number]         Height:       71.0 in Accession #:    [id number]        Weight:       230.0 lb Date of Birth:  05-31-1956         BSA:          2.238 m Patient Age:    65  years          BP:           137/98 mmHg Patient Gender: M                 HR:           95 bpm. Exam Location:  Inpatient  Procedure: 2D Echo, 3D Echo, Cardiac Doppler, Color Doppler and Strain Analysis  Indications:    Nonischemic Cardiomyopathy I42.8  History:        Patient has prior history of Echocardiogram examinations, most recent 09/05/2014. Heart failure with improved ejection fraction, nonobstructive coronary artery disease, multifocal atrial tachycardia, wandering atrial pacemaker, GERD, hypertension, hyperlipidemia, diabetes, and hypothyroidism,.  Sonographer:    Annabella Fell RDCS Referring Phys: [id number] ERIC CHEN  IMPRESSIONS   1. Left ventricular ejection fraction, by estimation, is 55 to 60%. The left ventricle has normal function. The left ventricle has no regional wall motion abnormalities. Left ventricular diastolic parameters are consistent with Grade I diastolic dysfunction (impaired relaxation). The average left ventricular global longitudinal strain is -20.4 %. The global longitudinal strain is normal. 2. Right ventricular systolic function is normal. The  right ventricular size is normal. 3. The mitral valve is normal in structure. No evidence of mitral valve regurgitation. No evidence of mitral stenosis. 4. The aortic valve is normal in structure. Aortic valve regurgitation is not visualized. No aortic stenosis is present. 5. There is borderline dilatation of the ascending aorta, measuring 37 mm. 6. The inferior vena cava is normal in size with greater than 50% respiratory variability, suggesting right atrial pressure of 3 mmHg.  Comparison(s): Prior images unable to be directly viewed, comparison made by report only.  FINDINGS Left Ventricle: Left ventricular ejection fraction, by estimation, is 55 to 60%. The left ventricle has normal function. The left ventricle has no regional wall motion abnormalities. The average left ventricular global longitudinal strain  is -20.4 %. The global longitudinal strain is normal. 3D left ventricular ejection fraction analysis performed but not reported based on interpreter judgement due to suboptimal tracking. The left ventricular internal cavity size was normal in size. There is no left ventricular hypertrophy. Left ventricular diastolic parameters are consistent with Grade I diastolic dysfunction (impaired relaxation). Normal left ventricular filling pressure.  Right Ventricle: The right ventricular size is normal. No increase in right ventricular wall thickness. Right ventricular systolic function is normal.  Left Atrium: Left atrial size was normal in size.  Right Atrium: Right atrial size was normal in size.  Pericardium: There is no evidence of pericardial effusion.  Mitral Valve: The mitral valve is normal in structure. No evidence of mitral valve regurgitation. No evidence of mitral valve stenosis.  Tricuspid Valve: The tricuspid valve is normal in structure. Tricuspid valve regurgitation is not demonstrated. No evidence of tricuspid stenosis.  Aortic Valve: The aortic valve is normal in structure. Aortic valve regurgitation is not visualized. No aortic stenosis is present.  Pulmonic Valve: The pulmonic valve was normal in structure. Pulmonic valve regurgitation is not visualized. No evidence of pulmonic stenosis.  Aorta: The aortic root is normal in size and structure. There is borderline dilatation of the ascending aorta, measuring 37 mm.  Venous: The inferior vena cava is normal in size with greater than 50% respiratory variability, suggesting right atrial pressure of 3 mmHg.  IAS/Shunts: No atrial level shunt detected by color flow Doppler.   LEFT VENTRICLE PLAX 2D LVIDd:         5.30 cm   Diastology LVIDs:         3.10 cm   LV e' medial:    5.33 cm/s LV PW:         1.00 cm   LV E/e' medial:  8.9 LV IVS:        1.10 cm   LV e' lateral:   6.09 cm/s LVOT diam:     2.30 cm   LV E/e' lateral:  7.8 LV SV:         57 LV SV Index:   26        2D Longitudinal Strain LVOT Area:     4.15 cm  2D Strain GLS Avg:     -20.4 %  3D Volume EF: 3D EF:        52 % LV EDV:       141 ml LV ESV:       68 ml LV SV:        73 ml  RIGHT VENTRICLE RV S prime:     14.30 cm/s TAPSE (M-mode): 2.1 cm  LEFT ATRIUM             Index  RIGHT ATRIUM           Index LA diam:        3.20 cm 1.43 cm/m   RA Area:     10.50 cm LA Vol (A2C):   35.8 ml 16.00 ml/m  RA Volume:   19.10 ml  8.54 ml/m LA Vol (A4C):   34.5 ml 15.42 ml/m LA Biplane Vol: 38.2 ml 17.07 ml/m AORTIC VALVE LVOT Vmax:   82.10 cm/s LVOT Vmean:  59.000 cm/s LVOT VTI:    0.138 m  AORTA Ao Root diam: 3.90 cm Ao Asc diam:  3.70 cm  MITRAL VALVE MV Area (PHT): 5.62 cm    SHUNTS MV Decel Time: 135 msec    Systemic VTI:  0.14 m MV E velocity: 47.30 cm/s  Systemic Diam: 2.30 cm MV A velocity: 60.70 cm/s MV E/A ratio:  0.78  Mihai Croitoru MD Electronically signed by Jerel Balding MD Signature Date/Time: 09/06/2021/1:43:23 PM    Final          ______________________________________________________________________________________________        Risk Assessment/Calculations:             Physical Exam:   VS:  BP 132/78   Pulse 67   Ht 5' 10 (1.778 m)   Wt 250 lb 8 oz (113.6 kg)   SpO2 94%   BMI 35.94 kg/m    Wt Readings from Last 3 Encounters:  05/10/24 250 lb 8 oz (113.6 kg)  04/22/24 240 lb (108.9 kg)  12/31/23 250 lb (113.4 kg)    GEN: Well nourished, overweight, well developed in no acute distress NECK: No JVD; No carotid bruits CARDIAC: RRR, no murmurs, rubs, gallops RESPIRATORY:  Clear to auscultation without rales, wheezing or rhonchi  ABDOMEN: Soft, non-tender, non-distended EXTREMITIES:  No edema; No deformity   ASSESSMENT AND PLAN: .    CAD - 08/2021 DES to RCA with known residual disease to be managed medically.   GDMT includes aspirin  81 mg daily, clopidogrel  75 mg daily,  amlodipine  5 mg daily, metoprolol  succinate 100 mg BID, repatha  140 mg q. 14 days. Stable with no anginal symptoms. No indication for ischemic evaluation.  Heart healthy diet and regular cardiovascular exercise encouraged.   Given more recent GERD like chest pain resolved with RollAids will cancel stress test. If he has angina-like chest pain, he will contact our office.  Discussed discontinuation of Plavix  and continuation of aspirin  to see if it assists his reflux frequency.  As symptoms have been overall improved he prefers to defer changes for now.  HTN - BP reasonably well controlled. Continue current antihypertensive regimen Amlodipine  5mg  daily, Toprol  100mg  BID, Olmesartan  40mg  daily.  Discussed to monitor BP at home at least 2 hours after medications and sitting for 5-10 minutes.   PAC - Prior EKG with occasional PAC. Denies palpitations.  ZIO monitor could be considered in the future if he becomes symptomatic.Continue Toprol  100mg  BID.    GERD - Continue Protonix  40mg  QD. May use Pepcid PRN. GERD friendly diet recommended. Discussed scheduling follow up with Dr. Albertus. Reports did not tolerate Pepcid with constipation.    HLD, LDL goal less than 55 -prior intolerance to high potency statin.  Has been on Repatha  without issue. Update lipid panel.   Statin myopathy - Previous myalgia on Atorvastatin , Simvastatin. Lipid management with Repatha , as above.    DM2 - Continue to follow with PCP.  Reports did not tolerate Mounjaro . Interested in alternate GLP 1, plans to discuss with  PCP at follow up    Hypothyroidism - Continue to follow with PCP.        Dispo: follow up in 6 months  Signed, Reche GORMAN Finder, NP

## 2024-05-10 NOTE — Patient Instructions (Signed)
 Medication Instructions:   Your physician recommends that you continue on your current medications as directed. Please refer to the Current Medication list given to you today.   *If you need a refill on your cardiac medications before your next appointment, please call your pharmacy*  Lab Work:  TODAY!!!! LIPID  If you have labs (blood work) drawn today and your tests are completely normal, you will receive your results only by: MyChart Message (if you have MyChart) OR A paper copy in the mail If you have any lab test that is abnormal or we need to change your treatment, we will call you to review the results.  Testing/Procedures:  Reche Finder, NP canceled stress test.  Follow-Up: At [facility], you and your health needs are our priority.  As part of our continuing mission to provide you with exceptional heart care, our providers are all part of one team.  This team includes your primary Cardiologist (physician) and Advanced Practice Providers or APPs (Physician Assistants and Nurse Practitioners) who all work together to provide you with the care you need, when you need it.  Your next appointment:   6 month(s)  Provider:   Reche Finder, NP    We recommend signing up for the patient portal called MyChart.  Sign up information is provided on this After Visit Summary.  MyChart is used to connect with patients for Virtual Visits (Telemedicine).  Patients are able to view lab/test results, encounter notes, upcoming appointments, etc.  Non-urgent messages can be sent to your provider as well.   To learn more about what you can do with MyChart, go to [email or web address].   Other Instructions  Your physician wants you to follow-up in: 6 months.  You will receive a reminder letter in the mail two months in advance. If you don't receive a letter, please call our office to schedule the follow-up appointment.

## 2024-05-10 NOTE — Telephone Encounter (Signed)
 Copied from CRM #[id number]. Topic: General - Other >> May 10, 2024  9:31 AM [place] E wrote: Reason for CRM: [facility] rep (with patient also on the line) called stating they need the lab orders released from 3/5 that were placed by PCP in order for patient to get them done. [facility] rep stated to call the patient once these orders have been released.

## 2024-05-12 ENCOUNTER — Inpatient Hospital Stay (HOSPITAL_COMMUNITY): Admission: RE | Admit: 2024-05-12 | Source: Ambulatory Visit | Attending: Student

## 2024-05-17 ENCOUNTER — Other Ambulatory Visit: Payer: Self-pay

## 2024-05-17 ENCOUNTER — Other Ambulatory Visit (HOSPITAL_COMMUNITY): Payer: Self-pay

## 2024-05-17 ENCOUNTER — Telehealth (HOSPITAL_COMMUNITY): Payer: Self-pay

## 2024-05-17 ENCOUNTER — Encounter: Payer: Self-pay | Admitting: Internal Medicine

## 2024-05-17 ENCOUNTER — Ambulatory Visit: Admitting: Internal Medicine

## 2024-05-17 VITALS — BP 142/88 | HR 67 | Ht 70.0 in | Wt 246.2 lb

## 2024-05-17 DIAGNOSIS — E1122 Type 2 diabetes mellitus with diabetic chronic kidney disease: Secondary | ICD-10-CM

## 2024-05-17 DIAGNOSIS — Z794 Long term (current) use of insulin: Secondary | ICD-10-CM

## 2024-05-17 DIAGNOSIS — E7800 Pure hypercholesterolemia, unspecified: Secondary | ICD-10-CM | POA: Diagnosis not present

## 2024-05-17 DIAGNOSIS — Z125 Encounter for screening for malignant neoplasm of prostate: Secondary | ICD-10-CM | POA: Diagnosis not present

## 2024-05-17 DIAGNOSIS — E538 Deficiency of other specified B group vitamins: Secondary | ICD-10-CM

## 2024-05-17 DIAGNOSIS — N1831 Chronic kidney disease, stage 3a: Secondary | ICD-10-CM

## 2024-05-17 DIAGNOSIS — I10 Essential (primary) hypertension: Secondary | ICD-10-CM | POA: Diagnosis not present

## 2024-05-17 DIAGNOSIS — E559 Vitamin D deficiency, unspecified: Secondary | ICD-10-CM

## 2024-05-17 DIAGNOSIS — K219 Gastro-esophageal reflux disease without esophagitis: Secondary | ICD-10-CM

## 2024-05-17 DIAGNOSIS — E119 Type 2 diabetes mellitus without complications: Secondary | ICD-10-CM

## 2024-05-17 MED ORDER — OZEMPIC (0.25 OR 0.5 MG/DOSE) 2 MG/3ML ~~LOC~~ SOPN
PEN_INJECTOR | SUBCUTANEOUS | 11 refills | Status: DC
  Filled 2024-05-17: qty 3, 56d supply, fill #0
  Filled 2024-05-17: qty 3, 28d supply, fill #0
  Filled 2024-06-08: qty 3, 28d supply, fill #1
  Filled 2024-07-06: qty 3, 28d supply, fill #2
  Filled 2024-08-03: qty 3, 28d supply, fill #3
  Filled 2024-08-24: qty 3, 28d supply, fill #4

## 2024-05-17 NOTE — Telephone Encounter (Signed)
 Pharmacy Patient Advocate Encounter  Received notification from [employer] that Prior Authorization for Ozempic  (0.25 or 0.5 MG/DOSE) 2MG /3ML pen-injectors  has been APPROVED from 05/17/24 to 05/17/25. Ran test claim, Copay is $0. This test claim was processed through [facility]- copay amounts may vary at other pharmacies due to pharmacy/plan contracts, or as the patient moves through the different stages of their insurance plan.   PA #/Case ID/Reference #: [id number]

## 2024-05-17 NOTE — Progress Notes (Signed)
 Patient ID: Keith [name], male   DOB: [birth year], 68 y.o.   MRN: [id number]        Chief Complaint: follow up HTN, HLD and DM with recent elevated A1c. , gerd and hx of hemachromatosis        HPI:  Keith [name] is a 68 y.o. male here after recent ED visit  8/8 with chest pain, MI ruled out and suggested he should f/u with GI Dr Albertus he has seen in past for possible reflux   Also has hx of hemachromatosis and due for f/u iron lab as well there.  Pt denies chest pain, increased sob or doe, wheezing, orthopnea, PND, increased LE swelling, palpitations, dizziness or syncope.   Pt denies polydipsia, polyuria, or new focal neuro s/s.   Has been mounjaro  intolerant in past, but willing to try ozempic  for sugar and wt control.  BP has been controlled at home and with cardiology last wk.         Wt Readings from Last 3 Encounters:  05/17/24 246 lb 3.2 oz (111.7 kg)  05/10/24 250 lb 8 oz (113.6 kg)  04/22/24 240 lb (108.9 kg)   BP Readings from Last 3 Encounters:  05/17/24 (!) 142/88  05/10/24 132/78  04/22/24 (!) 143/113         Past Medical History:  Diagnosis Date   Anxiety    Arthritis    Cervical spine pain    Chronic   Chronic HFimpEF (heart failure with improved ejection fraction) (HCC)    a. 01/2010 Echo: EF 30%, diff HK; b. 08/2014 Echo: EF 50%, no rwma, GrI DD.   Diabetes mellitus    Diverticulosis    Fatty liver    GERD (gastroesophageal reflux disease)    Hereditary hemochromatosis    Compound heterozygote. Pt gets periodic phlebotomies   History of colonic polyps    History of NICM (nonischemic cardiomyopathy) w/ subsequent improvement in LV function(HCC)    a. Echo (5/11) showed EF 30% with diffuse hypokinesis, normal wall thickness, mildly decreased RV systolic function. This may be a cardiomyopathy due to hemochromatosis versus tachy-mediated; b. 08/2014 Echo: EF 50%, no rwma, GrI DD.   Hyperlipidemia    Hypertension    Hypothyroidism    Low back pain    The pt  is S/P spinal fusion surgeries   Multifocal atrial tachycardia (HCC)    a. Holter monior in 5/11 showed frequent runs of symtomatic MAT with heart rate around 100-->prev on amio-->now on bb only.   Nonobstructive CAD (coronary artery disease)    a. 11/2008 MV: EF 50%, Inf HK, inferior scar and ischemia in the apical anterior septum and in the inferior wall; b. 11/2008 Cath: LM nl, LAD min irregs, LCX nl, OM1/2 nl, RCA nl.   Peptic ulcer disease    Thyroid  nodule    Wandering atrial pacemaker    Past Surgical History:  Procedure Laterality Date   CORONARY STENT INTERVENTION [name] 09/06/2021   Procedure: CORONARY STENT INTERVENTION;  Surgeon: Claudene Victory ORN, MD;  Location: [facility] INVASIVE [facility];  Service: Cardiovascular;  Laterality: [name];   INGUINAL HERNIA REPAIR Right    LEFT HEART CATH AND CORONARY ANGIOGRAPHY [name] 09/06/2021   Procedure: LEFT HEART CATH AND CORONARY ANGIOGRAPHY;  Surgeon: Claudene Victory ORN, MD;  Location: [facility] INVASIVE CV LAB;  Service: Cardiovascular;  Laterality: [name];   LUMBAR DISC SURGERY  09/15/1989   TONSILLECTOMY      reports that he quit smoking about 20 years  ago. His smoking use included cigarettes. He has been exposed to tobacco smoke. He has never used smokeless tobacco. He reports that he does not currently use alcohol. He reports that he does not currently use drugs. family history includes Alcohol abuse in his father; Colitis in his mother; Colon polyps in his mother; Diabetes in his mother, paternal grandfather, and paternal grandmother; Hypertension in his father; Multiple sclerosis in his mother and sister. Allergies  Allergen Reactions   Dicyclomine Nausea And Vomiting and Other (See Comments)    Lightheaded, dizziness, SOB   Doxycycline  Nausea Only    Weak, fatigue, sick on stomach   Lipitor [Atorvastatin ]     myalgias   Mounjaro  [Tirzepatide ] Nausea Only   Penicillins Other (See Comments)    Childhood allergy - unknown reaction   Reglan [Metoclopramide]  Other (See Comments)    Unknown reaction   Zocor [Simvastatin] Other (See Comments)    Myalgia    Zoloft [Sertraline Hcl] Other (See Comments)    Unknown reaction    Current Outpatient Medications on File Prior to Visit  Medication Sig Dispense Refill   Alcohol Swabs (B-D SINGLE USE SWABS REGULAR) PADS Use as directed once daily E11.9 100 each 3   amLODipine  (NORVASC ) 5 MG tablet Take 1 tablet (5 mg total) by mouth daily. 90 tablet 1   aspirin  81 MG EC tablet Take 81 mg by mouth daily.     Blood Glucose Calibration (TRUE METRIX LEVEL 1) Low SOLN Use as directed once daily E11.9 1 each 3   Blood Glucose Monitoring Suppl (TRUE METRIX AIR GLUCOSE METER) w/Device KIT USE AS DIRECTED 1 kit 0   Cholecalciferol (VITAMIN D -3) 25 MCG (1000 UT) CAPS Take 2,000 Units by mouth daily.     clopidogrel  (PLAVIX ) 75 MG tablet Take 1 tablet (75 mg total) by mouth daily. 90 tablet 3   DROPLET PEN NEEDLES 32G X 4 MM MISC      Evolocumab  (REPATHA  SURECLICK) 140 MG/ML SOAJ Inject 140 mg into the skin every 14 (fourteen) days. 6 mL 3   fenofibrate  (TRICOR ) 145 MG tablet Take 1 tablet (145 mg total) by mouth daily. 90 tablet 3   glucosamine-chondroitin 500-400 MG tablet Take 1 tablet by mouth daily.     glucose blood (RELION TRUE METRIX TEST STRIPS) test strip Use as instructed once daily E11.9 100 each 12   metFORMIN  (GLUCOPHAGE -XR) 500 MG 24 hr tablet Take 4 tablets (2,000 mg total) by mouth daily with breakfast. 360 tablet 3   metoprolol  succinate (TOPROL -XL) 100 MG 24 hr tablet Take 1 tablet (100 mg total) by mouth 2 (two) times daily with or immediately following a meal. 180 tablet 3   olmesartan  (BENICAR ) 40 MG tablet Take 1 tablet (40 mg total) by mouth daily. 90 tablet 3   Omega-3 Fatty Acids (FISH OIL PO) Take 1 tablet by mouth daily.      pantoprazole  (PROTONIX ) 40 MG tablet Take 1 tablet (40 mg total) by mouth daily. 90 tablet 3   pioglitazone  (ACTOS ) 30 MG tablet Take 1 tablet (30 mg total) by mouth  daily. 90 tablet 3   sildenafil  (VIAGRA ) 100 MG tablet Take 0.5-1 tablets (50-100 mg total) by mouth daily as needed for erectile dysfunction. 30 tablet 5   TRUEplus Lancets 33G MISC Use as directed once daily E11.9 100 each 12   tadalafil  (CIALIS ) 20 MG tablet Take 1 tablet (20 mg total) by mouth daily as needed for erectile dysfunction. 30 tablet 11  No current facility-administered medications on file prior to visit.        ROS:  All others reviewed and negative.  Objective        PE:  BP (!) 142/88   Pulse 67   Ht 5' 10 (1.778 m)   Wt 246 lb 3.2 oz (111.7 kg)   SpO2 99%   BMI 35.33 kg/m                 Constitutional: Pt appears in NAD               HENT: Head: NCAT.                Right Ear: External ear normal.                 Left Ear: External ear normal.                Eyes: . Pupils are equal, round, and reactive to light. Conjunctivae and EOM are normal               Nose: without d/c or deformity               Neck: Neck supple. Gross normal ROM               Cardiovascular: Normal rate and regular rhythm.                 Pulmonary/Chest: Effort normal and breath sounds without rales or wheezing.                Abd:  Soft, NT, ND, + BS, no organomegaly               Neurological: Pt is alert. At baseline orientation, motor grossly intact               Skin: Skin is warm. No rashes, no other new lesions, LE edema - none               Psychiatric: Pt behavior is normal without agitation   Micro: none  Cardiac tracings I have personally interpreted today:  none  Pertinent Radiological findings (summarize): none   Lab Results  Component Value Date   WBC 7.4 04/22/2024   HGB 13.8 04/22/2024   HCT 40.6 04/22/2024   PLT 252 04/22/2024   GLUCOSE 163 (H) 05/10/2024   CHOL 90 05/10/2024   TRIG 141.0 05/10/2024   HDL 35.90 (L) 05/10/2024   LDLDIRECT 127.0 11/15/2019   LDLCALC 26 05/10/2024   ALT 15 05/10/2024   AST 14 05/10/2024   NA 140 05/10/2024   K 4.0  05/10/2024   CL 102 05/10/2024   CREATININE 1.35 05/10/2024   BUN 16 05/10/2024   CO2 26 05/10/2024   TSH 4.58 11/13/2023   PSA 1.05 11/13/2023   INR 1.1 (H) 07/09/2016   HGBA1C 8.6 (H) 05/10/2024   MICROALBUR <0.7 11/13/2023   Assessment/Plan:  WILMORE HOLSOMBACK is a 68 y.o. White or Caucasian [1] male with  has a past medical history of Anxiety, Arthritis, Cervical spine pain, Chronic HFimpEF (heart failure with improved ejection fraction) (HCC), Diabetes mellitus, Diverticulosis, Fatty liver, GERD (gastroesophageal reflux disease), Hereditary hemochromatosis, History of colonic polyps, History of NICM (nonischemic cardiomyopathy) w/ subsequent improvement in LV function(HCC), Hyperlipidemia, Hypertension, Hypothyroidism, Low back pain, Multifocal atrial tachycardia (HCC), Nonobstructive CAD (coronary artery disease), Peptic ulcer disease, Thyroid  nodule, and Wandering atrial pacemaker.  Hemochromatosis Also refer back to Dr  Pyrtle GI for f/u  GERD Mild to mod, to continue protonix  40 every day, refer to GI per pt reqeust,  to f/u any worsening symptoms or concerns   Essential hypertension BP Readings from Last 3 Encounters:  05/17/24 (!) 142/88  05/10/24 132/78  04/22/24 (!) 143/113   Uncontrolled, but controlled at home per pt, pt to continue medical treatment noravsc 5 every day, toprol  xl 100 bid, benicar  40 qd    Insulin  dependent type 2 diabetes mellitus, controlled (HCC) Lab Results  Component Value Date   HGBA1C 8.6 (H) 05/10/2024   Uncontrolled,, pt to continue current medical treatment metformin  ER 50 0gm - 4 every day, actos  30 every day, but add ozempic  0.25 mg weekly with intent to titrate   CKD (chronic kidney disease) stage 3, GFR 30-59 ml/min (HCC) Lab Results  Component Value Date   CREATININE 1.35 05/10/2024   Stable overall, cont to avoid nephrotoxins  Followup: Return in about 6 months (around 11/14/2024).  Lynwood Rush, MD 05/17/2024 1:14 PM [facility]

## 2024-05-17 NOTE — Telephone Encounter (Signed)
 PA request has been Received. New Encounter has been or will be created for follow up. For additional info see Pharmacy Prior Auth telephone encounter from 05/17/24.

## 2024-05-17 NOTE — Patient Instructions (Signed)
 Ok to change the Mounjaro  to Ozempic  0.25 mg weekly, and call in 1 month if you need the higher dosing  Please continue all other medications as before, and refills have been done if requested.  Please have the pharmacy call with any other refills you may need.  Please continue your efforts at being more active, low cholesterol diet, and weight control  Please keep your appointments with your specialists as you may have planned - cardiology  You will be contacted regarding the referral for: Dr Albertus  Please make an Appointment to return in 6 months, or sooner if needed, also with Lab Appointment for testing done 3-5 days before at the FIRST FLOOR Lab (so this is for TWO appointments - please see the scheduling desk as you leave)

## 2024-05-17 NOTE — Assessment & Plan Note (Signed)
 Also refer back to Dr Albertus GI for f/u

## 2024-05-17 NOTE — Assessment & Plan Note (Signed)
 BP Readings from Last 3 Encounters:  05/17/24 (!) 142/88  05/10/24 132/78  04/22/24 (!) 143/113   Uncontrolled, but controlled at home per pt, pt to continue medical treatment noravsc 5 every day, toprol  xl 100 bid, benicar  40 qd

## 2024-05-17 NOTE — Assessment & Plan Note (Signed)
 Mild to mod, to continue protonix  40 every day, refer to GI per pt reqeust,  to f/u any worsening symptoms or concerns

## 2024-05-17 NOTE — Assessment & Plan Note (Signed)
 Lab Results  Component Value Date   HGBA1C 8.6 (H) 05/10/2024   Uncontrolled,, pt to continue current medical treatment metformin  ER 50 0gm - 4 every day, actos  30 every day, but add ozempic  0.25 mg weekly with intent to titrate

## 2024-05-17 NOTE — Assessment & Plan Note (Signed)
 Lab Results  Component Value Date   CREATININE 1.35 05/10/2024   Stable overall, cont to avoid nephrotoxins

## 2024-05-23 ENCOUNTER — Other Ambulatory Visit: Payer: Self-pay

## 2024-05-23 ENCOUNTER — Other Ambulatory Visit (HOSPITAL_BASED_OUTPATIENT_CLINIC_OR_DEPARTMENT_OTHER): Payer: Self-pay | Admitting: Family

## 2024-05-23 DIAGNOSIS — I10 Essential (primary) hypertension: Secondary | ICD-10-CM

## 2024-05-24 ENCOUNTER — Other Ambulatory Visit: Payer: Self-pay

## 2024-05-24 ENCOUNTER — Other Ambulatory Visit (HOSPITAL_COMMUNITY): Payer: Self-pay

## 2024-05-24 MED ORDER — AMLODIPINE BESYLATE 5 MG PO TABS
5.0000 mg | ORAL_TABLET | Freq: Every day | ORAL | 3 refills | Status: AC
  Filled 2024-05-24: qty 90, 90d supply, fill #0
  Filled 2024-08-19: qty 90, 90d supply, fill #1

## 2024-05-24 MED FILL — Olmesartan Medoxomil Tab 40 MG: ORAL | 90 days supply | Qty: 90 | Fill #2 | Status: AC

## 2024-05-31 ENCOUNTER — Other Ambulatory Visit (HOSPITAL_COMMUNITY): Payer: Self-pay

## 2024-07-04 ENCOUNTER — Other Ambulatory Visit (INDEPENDENT_AMBULATORY_CARE_PROVIDER_SITE_OTHER)

## 2024-07-04 ENCOUNTER — Encounter: Payer: Self-pay | Admitting: Gastroenterology

## 2024-07-04 ENCOUNTER — Ambulatory Visit: Admitting: Gastroenterology

## 2024-07-04 DIAGNOSIS — I5022 Chronic systolic (congestive) heart failure: Secondary | ICD-10-CM

## 2024-07-04 DIAGNOSIS — K219 Gastro-esophageal reflux disease without esophagitis: Secondary | ICD-10-CM

## 2024-07-04 DIAGNOSIS — E119 Type 2 diabetes mellitus without complications: Secondary | ICD-10-CM

## 2024-07-04 DIAGNOSIS — I428 Other cardiomyopathies: Secondary | ICD-10-CM

## 2024-07-04 NOTE — Patient Instructions (Signed)
 Your provider has requested that you go to the basement level for lab work before leaving today. Press B on the elevator. The lab is located at the first door on the left as you exit the elevator.  Your provider has ordered [employer] stool testing for you. You have received a kit from our office today containing all necessary supplies to complete this test. Please carefully read the stool collection instructions provided in the kit before opening the accompanying materials. In addition, be sure there is a label providing your full name and date of birth on the puritan opti-swab tube that is supplied in the kit (if you do not see a label with this information on your test tube, please make us  aware before test collection!). After completing the test, you should secure the purtian tube into the specimen biohazard bag. The [facility] E-Req sheet (including date and time of specimen collection) should be placed into the outside pocket of the specimen biohazard bag and returned to the [place] lab (basement floor of [facility]) within 3 days of collection. Please make sure to give the specimen to a staff member at the lab. DO NOT leave the specimen on the counter.   If the specimen date and time (can be found in the upper right boxed portion of the sheet) are not filled out on the E-Req sheet, the test will NOT be performed.   _______________________________________________________  If your blood pressure at your visit was 140/90 or greater, please contact your primary care physician to follow up on this.  _______________________________________________________  If you are age 70 or older, your body mass index should be between 23-30. Your Body mass index is 35.73 kg/m. If this is out of the aforementioned range listed, please consider follow up with your Primary Care Provider.  If you are age 30 or younger, your body mass index should be between 19-25. Your Body mass  index is 35.73 kg/m. If this is out of the aformentioned range listed, please consider follow up with your Primary Care Provider.   ________________________________________________________  The [facility] providers would like to encourage you to use MYCHART to communicate with providers for non-urgent requests or questions.  Due to long hold times on the telephone, sending your provider a message by [facility] may be a faster and more efficient way to get a response.  Please allow 48 business hours for a response.  Please remember that this is for non-urgent requests.  _______________________________________________________  [facility] is using a team-based approach to care.  Your team is made up of your doctor and two to three APPS. Our APPS (Nurse Practitioners and Physician Assistants) work with your physician to ensure care continuity for you. They are fully qualified to address your health concerns and develop a treatment plan. They communicate directly with your gastroenterologist to care for you. Seeing the Advanced Practice Practitioners on your physician's team can help you by facilitating care more promptly, often allowing for earlier appointments, access to diagnostic testing, procedures, and other specialty referrals.

## 2024-07-04 NOTE — Progress Notes (Signed)
 Chief Complaint: GERD Primary GI MD: Dr. Albertus  HPI:  69 year old male history of nonishcemic cardiomyopathy, chronic systolic HF, pacemaker, carotid stenosis, on plavix , presents for evaluation of GERD.   He usually follows here with Dr. Albertus for his hemochromatosis, but has not been seen here since 2022. He is heterozygote for C282Y and heterozygote for H63D with iron overload in the past requiring phlebotomy.   Discussed the use of AI scribe software for clinical note transcription with the patient, who gave verbal consent to proceed.  He has been experiencing severe chest pain, which is more pronounced when lying back and improves with Rolaids. He has a history of heart failure and heart attack and is currently on Plavix . He was prescribed pantoprazole  40 mg once daily for his symptoms.  He describes a specific incident where consuming homemade pimento cheese with bacon triggered his chest pain. After abstaining from the cheese for a week, the pain subsided, but it returned upon consuming it again. He experiences similar reflux flare-ups once or twice a year, with a significant episode occurring two years ago.  In 2015, he underwent an endoscopy that revealed a hiatal hernia, which can exacerbate reflux symptoms, although his stomach and esophagus appeared normal at that time.  He has a history of hemochromatosis, for which he used to have blood drawn, with the last recorded blood count being normal in August.  No current chest pain. Reports previous episodes of chest pain associated with dietary triggers, specifically pimento cheese. No recent increase in pantoprazole  dosage.   Past Medical History:  Diagnosis Date   Anxiety    Arthritis    Cervical spine pain    Chronic   Chronic HFimpEF (heart failure with improved ejection fraction) (HCC)    a. 01/2010 Echo: EF 30%, diff HK; b. 08/2014 Echo: EF 50%, no rwma, GrI DD.   Diabetes mellitus    Diverticulosis    Fatty liver     GERD (gastroesophageal reflux disease)    Hereditary hemochromatosis    Compound heterozygote. Pt gets periodic phlebotomies   History of colonic polyps    History of NICM (nonischemic cardiomyopathy) w/ subsequent improvement in LV function(HCC)    a. Echo (5/11) showed EF 30% with diffuse hypokinesis, normal wall thickness, mildly decreased RV systolic function. This may be a cardiomyopathy due to hemochromatosis versus tachy-mediated; b. 08/2014 Echo: EF 50%, no rwma, GrI DD.   Hyperlipidemia    Hypertension    Hypothyroidism    Low back pain    The pt is S/P spinal fusion surgeries   Multifocal atrial tachycardia    a. Holter monior in 5/11 showed frequent runs of symtomatic MAT with heart rate around 100-->prev on amio-->now on bb only.   Nonobstructive CAD (coronary artery disease)    a. 11/2008 MV: EF 50%, Inf HK, inferior scar and ischemia in the apical anterior septum and in the inferior wall; b. 11/2008 Cath: LM nl, LAD min irregs, LCX nl, OM1/2 nl, RCA nl.   Peptic ulcer disease    Thyroid  nodule    Wandering atrial pacemaker     Past Surgical History:  Procedure Laterality Date   CORONARY STENT INTERVENTION [name] 09/06/2021   Procedure: CORONARY STENT INTERVENTION;  Surgeon: Claudene Victory ORN, MD;  Location: [facility] INVASIVE CV LAB;  Service: Cardiovascular;  Laterality: [name];   INGUINAL HERNIA REPAIR Right    LEFT HEART CATH AND CORONARY ANGIOGRAPHY [name] 09/06/2021   Procedure: LEFT HEART CATH AND CORONARY ANGIOGRAPHY;  Surgeon: Claudene Victory ORN, MD;  Location: [facility] INVASIVE CV LAB;  Service: Cardiovascular;  Laterality: [name];   LUMBAR DISC SURGERY  09/15/1989   TONSILLECTOMY      Current Outpatient Medications  Medication Sig Dispense Refill   Alcohol Swabs (B-D SINGLE USE SWABS REGULAR) PADS Use as directed once daily E11.9 100 each 3   amLODipine  (NORVASC ) 5 MG tablet Take 1 tablet (5 mg total) by mouth daily. 90 tablet 3   aspirin  81 MG EC tablet Take 81 mg by mouth daily.     Blood  Glucose Calibration (TRUE METRIX LEVEL 1) Low SOLN Use as directed once daily E11.9 1 each 3   Blood Glucose Monitoring Suppl (TRUE METRIX AIR GLUCOSE METER) w/Device KIT USE AS DIRECTED 1 kit 0   Cholecalciferol (VITAMIN D -3) 25 MCG (1000 UT) CAPS Take 2,000 Units by mouth daily.     clopidogrel  (PLAVIX ) 75 MG tablet Take 1 tablet (75 mg total) by mouth daily. 90 tablet 3   DROPLET PEN NEEDLES 32G X 4 MM MISC      Evolocumab  (REPATHA  SURECLICK) 140 MG/ML SOAJ Inject 140 mg into the skin every 14 (fourteen) days. 6 mL 3   fenofibrate  (TRICOR ) 145 MG tablet Take 1 tablet (145 mg total) by mouth daily. 90 tablet 3   glucosamine-chondroitin 500-400 MG tablet Take 1 tablet by mouth daily.     glucose blood (RELION TRUE METRIX TEST STRIPS) test strip Use as instructed once daily E11.9 100 each 12   metFORMIN  (GLUCOPHAGE -XR) 500 MG 24 hr tablet Take 4 tablets (2,000 mg total) by mouth daily with breakfast. 360 tablet 3   metoprolol  succinate (TOPROL -XL) 100 MG 24 hr tablet Take 1 tablet (100 mg total) by mouth 2 (two) times daily with or immediately following a meal. 180 tablet 3   olmesartan  (BENICAR ) 40 MG tablet Take 1 tablet (40 mg total) by mouth daily. 90 tablet 3   Omega-3 Fatty Acids (FISH OIL PO) Take 1 tablet by mouth daily.      pantoprazole  (PROTONIX ) 40 MG tablet Take 1 tablet (40 mg total) by mouth daily. 90 tablet 3   pioglitazone  (ACTOS ) 30 MG tablet Take 1 tablet (30 mg total) by mouth daily. 90 tablet 3   Semaglutide ,0.25 or 0.5MG /DOS, (OZEMPIC , 0.25 OR 0.5 MG/DOSE,) 2 MG/3ML SOPN Take 0.25 mg subcutaneous once weekly 3 mL 11   sildenafil  (VIAGRA ) 100 MG tablet Take 0.5-1 tablets (50-100 mg total) by mouth daily as needed for erectile dysfunction. 30 tablet 5   tadalafil  (CIALIS ) 20 MG tablet Take 1 tablet (20 mg total) by mouth daily as needed for erectile dysfunction. 30 tablet 11   TRUEplus Lancets 33G MISC Use as directed once daily E11.9 100 each 12   No current  facility-administered medications for this visit.    Allergies as of 07/04/2024 - Review Complete 07/04/2024  Allergen Reaction Noted   Dicyclomine Nausea And Vomiting and Other (See Comments) 08/01/2021   Doxycycline  Nausea Only 09/06/2021   Lipitor [atorvastatin ]  05/12/2013   Mounjaro  [tirzepatide ] Nausea Only 05/17/2024   Penicillins Other (See Comments)    Reglan [metoclopramide] Other (See Comments) 06/04/2021   Zocor [simvastatin] Other (See Comments) 11/16/2018   Zoloft [sertraline hcl] Other (See Comments) 06/04/2021    Family History  Problem Relation Age of Onset   Colon polyps Mother    Multiple sclerosis Mother    Diabetes Mother    Colitis Mother    Hypertension Father    Alcohol abuse Father  Multiple sclerosis Sister    Diabetes Paternal Grandmother    Diabetes Paternal Grandfather    Colon cancer Neg Hx     Social History   Socioeconomic History   Marital status: Married    Spouse name: Not on file   Number of children: 3   Years of education: Not on file   Highest education level: Not on file  Occupational History   Occupation: Disabled    Employer: UNEMPLOYED  Tobacco Use   Smoking status: Former    Current packs/day: 0.00    Types: Cigarettes    Quit date: 09/16/2003    Years since quitting: 20.8    Passive exposure: Past   Smokeless tobacco: Never  Vaping Use   Vaping status: Never Used  Substance and Sexual Activity   Alcohol use: Not Currently    Comment: social - 1 drink every few months.   Drug use: Not Currently    Comment: Quit in 1980   Sexual activity: Yes  Other Topics Concern   Not on file  Social History Narrative   Lives in [place] by himself.  Does not routinely exercise.   Social Drivers of Corporate investment banker Strain: Low Risk  (12/31/2023)   Overall Financial Resource Strain (CARDIA)    Difficulty of Paying Living Expenses: Not hard at all  Food Insecurity: No Food Insecurity (12/31/2023)   Hunger Vital  Sign    Worried About Running Out of Food in the Last Year: Never true    Ran Out of Food in the Last Year: Never true  Transportation Needs: No Transportation Needs (12/31/2023)   PRAPARE - Administrator, Civil Service (Medical): No    Lack of Transportation (Non-Medical): No  Physical Activity: Inactive (12/31/2023)   Exercise Vital Sign    Days of Exercise per Week: 0 days    Minutes of Exercise per Session: 0 min  Stress: No Stress Concern Present (12/31/2023)   [employer] of Occupational Health - Occupational Stress Questionnaire    Feeling of Stress : Not at all  Social Connections: Moderately Isolated (12/31/2023)   Social Connection and Isolation Panel    Frequency of Communication with Friends and Family: More than three times a week    Frequency of Social Gatherings with Friends and Family: More than three times a week    Attends Religious Services: Never    Database administrator or Organizations: No    Attends Banker Meetings: Never    Marital Status: Married  Catering manager Violence: Not At Risk (12/31/2023)   Humiliation, Afraid, Rape, and Kick questionnaire    Fear of Current or Ex-Partner: No    Emotionally Abused: No    Physically Abused: No    Sexually Abused: No    Review of Systems:    Constitutional: No weight loss, fever, chills, weakness or fatigue HEENT: Eyes: No change in vision               Ears, Nose, Throat:  No change in hearing or congestion Skin: No rash or itching Cardiovascular: No chest pain, chest pressure or palpitations   Respiratory: No SOB or cough Gastrointestinal: See HPI and otherwise negative Genitourinary: No dysuria or change in urinary frequency Neurological: No headache, dizziness or syncope Musculoskeletal: No new muscle or joint pain Hematologic: No bleeding or bruising Psychiatric: No history of depression or anxiety    Physical Exam:  Vital signs: BP (!) 140/100   Pulse  89   Ht 5' 10  (1.778 m)   Wt 249 lb (112.9 kg)   BMI 35.73 kg/m   Constitutional: NAD, alert and cooperative Head:  Normocephalic and atraumatic. Eyes:   PEERL, EOMI. No icterus. Conjunctiva pink. Respiratory: Respirations even and unlabored. Lungs clear to auscultation bilaterally.   No wheezes, crackles, or rhonchi.  Cardiovascular:  Regular rate and rhythm. No peripheral edema, cyanosis or pallor.  Gastrointestinal:  Soft, nondistended, nontender. No rebound or guarding. Normal bowel sounds. No appreciable masses or hepatomegaly. Rectal:  Declines Msk:  Symmetrical without gross deformities. Without edema, no deformity or joint abnormality.  Neurologic:  Alert and  oriented x4;  grossly normal neurologically.  Skin:   Dry and intact without significant lesions or rashes. Psychiatric: Oriented to person, place and time. Demonstrates good judgement and reason without abnormal affect or behaviors.  RELEVANT LABS AND IMAGING: CBC    Component Value Date/Time   WBC 7.4 04/22/2024 0942   RBC 4.55 04/22/2024 0942   HGB 13.8 04/22/2024 0942   HGB 14.4 02/13/2022 1459   HCT 40.6 04/22/2024 0942   HCT 40.3 02/13/2022 1459   PLT 252 04/22/2024 0942   PLT 264 02/13/2022 1459   MCV 89.2 04/22/2024 0942   MCV 91 02/13/2022 1459   MCH 30.3 04/22/2024 0942   MCHC 34.0 04/22/2024 0942   RDW 13.3 04/22/2024 0942   RDW 13.2 02/13/2022 1459   LYMPHSABS 2.0 11/13/2023 0958   MONOABS 0.7 11/13/2023 0958   EOSABS 0.2 11/13/2023 0958   BASOSABS 0.1 11/13/2023 0958    CMP     Component Value Date/Time   NA 140 05/10/2024 1027   K 4.0 05/10/2024 1027   CL 102 05/10/2024 1027   CO2 26 05/10/2024 1027   GLUCOSE 163 (H) 05/10/2024 1027   BUN 16 05/10/2024 1027   CREATININE 1.35 05/10/2024 1027   CALCIUM  9.1 05/10/2024 1027   PROT 7.3 05/10/2024 1027   ALBUMIN 4.3 05/10/2024 1027   AST 14 05/10/2024 1027   ALT 15 05/10/2024 1027   ALKPHOS 37 (L) 05/10/2024 1027   BILITOT 0.6 05/10/2024 1027    GFRNONAA >60 04/22/2024 1212   GFRAA 91 11/17/2008 1224     Assessment/Plan:   Atypical chest pain GERD Seen by cardio and PCP. Occurs twice per year and recently occurred with pimento cheese consumption relieved with rolaids, has since resolved with no further recurrence. On pantoprazole  40mg  once daily. On ASA, no other NSAIDs. EGD 2015 with hiatal hernia, otherwise normal Likely GERD with negative cardiac workup and obvious trigger food. -- educated patient on lifestyle modifications -- offered EGD if pain returns and becomes more persistent -- continue pantoprazole  40mg  once daily, if worsening symptoms could increase to BID -- H pylori stool antigen -- follow up as needed or if pain returns.  Hemochromatosis He is heterozygote for C282Y and heterozygote for H63D with iron overload in the past requiring phlebotomy.  In 2017 there was no evidence of fibrosis with a hepatic elastography fibrosis at F0. Hgb 04/2024 was normal -- CBC and iron studies. Pending results could consider repeat phlebotomy if necessary  Colon cancer screening Colonoscopy 07/2021 with two 3-15mm Tas in ascending and cecum. External and internal hemorrhoids. Repeat 7 years -- repeat 07/2028  CAD 08/2021 DES to RCA on ASA, plavix  and following with cardiology. ECHO with EF 55-60%.  Diabetes type II on Insulin   Nestor Mollie DEVONNA [facility] 07/04/2024, 9:24 AM  Cc: Norleen Lynwood ORN, MD

## 2024-07-06 ENCOUNTER — Other Ambulatory Visit

## 2024-07-06 DIAGNOSIS — R42 Dizziness and giddiness: Secondary | ICD-10-CM

## 2024-07-06 DIAGNOSIS — K219 Gastro-esophageal reflux disease without esophagitis: Secondary | ICD-10-CM | POA: Diagnosis not present

## 2024-07-06 LAB — CBC WITH DIFFERENTIAL/PLATELET
Basophils Absolute: 0.1 10*3/uL (ref 0.0–0.1)
Basophils Relative: 0.9 % (ref 0.0–3.0)
Eosinophils Absolute: 0.2 10*3/uL (ref 0.0–0.7)
Eosinophils Relative: 2.4 % (ref 0.0–5.0)
HCT: 42.1 % (ref 39.0–52.0)
Hemoglobin: 14.3 g/dL (ref 13.0–17.0)
Lymphocytes Relative: 28.3 % (ref 12.0–46.0)
Lymphs Abs: 2 10*3/uL (ref 0.7–4.0)
MCHC: 34.1 g/dL (ref 30.0–36.0)
MCV: 89 fl (ref 78.0–100.0)
Monocytes Absolute: 0.7 10*3/uL (ref 0.1–1.0)
Monocytes Relative: 10.2 % (ref 3.0–12.0)
Neutro Abs: 4.1 10*3/uL (ref 1.4–7.7)
Neutrophils Relative %: 58.2 % (ref 43.0–77.0)
Platelets: 249 10*3/uL (ref 150.0–400.0)
RBC: 4.72 Mil/uL (ref 4.22–5.81)
RDW: 14.2 % (ref 11.5–15.5)
WBC: 7 10*3/uL (ref 4.0–10.5)

## 2024-07-06 LAB — IBC PANEL
Iron: 77 ug/dL (ref 42–165)
Saturation Ratios: 19.3 % — ABNORMAL LOW (ref 20.0–50.0)
TIBC: 399 ug/dL (ref 250.0–450.0)
Transferrin: 285 mg/dL (ref 212.0–360.0)

## 2024-07-06 LAB — COMPREHENSIVE METABOLIC PANEL WITH GFR
ALT: 16 U/L (ref 0–53)
AST: 16 U/L (ref 0–37)
Albumin: 4.3 g/dL (ref 3.5–5.2)
Alkaline Phosphatase: 38 U/L — ABNORMAL LOW (ref 39–117)
BUN: 15 mg/dL (ref 6–23)
CO2: 29 meq/L (ref 19–32)
Calcium: 9.1 mg/dL (ref 8.4–10.5)
Chloride: 101 meq/L (ref 96–112)
Creatinine, Ser: 1.29 mg/dL (ref 0.40–1.50)
GFR: 57.47 mL/min — ABNORMAL LOW (ref >60.00)
Glucose, Bld: 120 mg/dL — ABNORMAL HIGH (ref 70–99)
Potassium: 4.6 meq/L (ref 3.5–5.1)
Sodium: 136 meq/L (ref 135–145)
Total Bilirubin: 0.5 mg/dL (ref 0.2–1.2)
Total Protein: 7.2 g/dL (ref 6.0–8.3)

## 2024-07-06 LAB — FERRITIN: Ferritin: 36.8 ng/mL (ref 22.0–322.0)

## 2024-07-07 ENCOUNTER — Ambulatory Visit: Payer: Self-pay | Admitting: Gastroenterology

## 2024-08-19 ENCOUNTER — Other Ambulatory Visit: Payer: Self-pay

## 2024-08-19 ENCOUNTER — Other Ambulatory Visit: Payer: Self-pay | Admitting: Internal Medicine

## 2024-08-19 ENCOUNTER — Other Ambulatory Visit (HOSPITAL_COMMUNITY): Payer: Self-pay

## 2024-08-19 ENCOUNTER — Telehealth: Payer: Self-pay

## 2024-08-19 MED ORDER — OLMESARTAN MEDOXOMIL 40 MG PO TABS
40.0000 mg | ORAL_TABLET | Freq: Every day | ORAL | 3 refills | Status: AC
  Filled 2024-08-19: qty 90, 90d supply, fill #0

## 2024-08-19 NOTE — Telephone Encounter (Signed)
 Pharmacy Patient Advocate Encounter   Received notification from Onbase that prior authorization for Repatha  SureClick 140 is required/requested.   Insurance verification completed.   The patient is insured through [facility] ADVANTAGE/RX ADVANCE.   Per test claim: PA required; PA submitted to above mentioned insurance via Latent Key/confirmation #/EOC [id number] Status is pending

## 2024-08-22 ENCOUNTER — Other Ambulatory Visit (HOSPITAL_COMMUNITY): Payer: Self-pay

## 2024-08-22 NOTE — Telephone Encounter (Signed)
 Pharmacy Patient Advocate Encounter  Received notification from [employer] that Prior Authorization for Repatha  Sureclick 140 has been APPROVED from 08/19/24 to 08/19/25. Ran test claim, Copay is $0.00. This test claim was processed through [facility]- copay amounts may vary at other pharmacies due to pharmacy/plan contracts, or as the patient moves through the different stages of their insurance plan.   PA #/Case ID/Reference #: # [id number]

## 2024-08-24 ENCOUNTER — Telehealth: Payer: Self-pay

## 2024-08-24 NOTE — Telephone Encounter (Signed)
 Copied from CRM #[id number]. Topic: Clinical - Request for Lab/Test Order >> Aug 24, 2024  1:32 PM Suzen RAMAN wrote: Reason for CRM: Patient would like to have routine labs completed prior to upcoming appointment on 12/30/23. Patient would like to have labs completed at the [facility] location.    CB# [phone]

## 2024-09-13 ENCOUNTER — Telehealth: Payer: Self-pay

## 2024-09-13 NOTE — Telephone Encounter (Signed)
 Copied from CRM #[id number]. Topic: Clinical - Prescription Issue >> Sep 13, 2024  9:35 AM Alfonso HERO wrote: Reason for CRM: patient calling to get his dosage of ozempic  increased.

## 2024-09-14 ENCOUNTER — Other Ambulatory Visit: Payer: Self-pay

## 2024-09-14 MED ORDER — OZEMPIC (0.25 OR 0.5 MG/DOSE) 2 MG/3ML ~~LOC~~ SOPN
PEN_INJECTOR | SUBCUTANEOUS | 3 refills | Status: AC
  Filled 2024-09-14: qty 9, 84d supply, fill #0

## 2024-09-14 NOTE — Addendum Note (Signed)
 Addended by: NORLEEN LYNWOOD ORN on: 09/14/2024 08:10 AM   Modules accepted: Orders

## 2024-09-18 ENCOUNTER — Other Ambulatory Visit (HOSPITAL_COMMUNITY): Payer: Self-pay

## 2024-09-19 ENCOUNTER — Other Ambulatory Visit (HOSPITAL_COMMUNITY): Payer: Self-pay

## 2024-09-19 ENCOUNTER — Other Ambulatory Visit: Payer: Self-pay

## 2024-09-20 ENCOUNTER — Other Ambulatory Visit (HOSPITAL_COMMUNITY): Payer: Self-pay

## 2024-09-21 ENCOUNTER — Encounter: Payer: Self-pay | Admitting: Pharmacist

## 2024-09-21 ENCOUNTER — Other Ambulatory Visit: Payer: Self-pay

## 2024-09-22 ENCOUNTER — Other Ambulatory Visit (HOSPITAL_COMMUNITY): Payer: Self-pay

## 2024-09-22 ENCOUNTER — Other Ambulatory Visit: Payer: Self-pay

## 2024-11-14 ENCOUNTER — Ambulatory Visit: Admitting: Internal Medicine

## 2024-12-29 ENCOUNTER — Encounter: Admitting: Nurse Practitioner

## 2025-01-02 ENCOUNTER — Ambulatory Visit

## 2025-02-15 ENCOUNTER — Ambulatory Visit
